# Patient Record
Sex: Male | Born: 1945 | Race: White | Hispanic: No | Marital: Married | Smoking: Current every day smoker
Health system: Southern US, Community
[De-identification: ages and names within clinical notes are randomized; demographics above are authoritative.]

## PROBLEM LIST (undated history)

## (undated) DIAGNOSIS — K922 Gastrointestinal hemorrhage, unspecified: Secondary | ICD-10-CM

## (undated) DIAGNOSIS — I4892 Unspecified atrial flutter: Secondary | ICD-10-CM

## (undated) DIAGNOSIS — K7682 Hepatic encephalopathy: Secondary | ICD-10-CM

## (undated) DIAGNOSIS — I499 Cardiac arrhythmia, unspecified: Secondary | ICD-10-CM

## (undated) DIAGNOSIS — K259 Gastric ulcer, unspecified as acute or chronic, without hemorrhage or perforation: Secondary | ICD-10-CM

## (undated) DIAGNOSIS — J154 Pneumonia due to other streptococci: Secondary | ICD-10-CM

## (undated) DIAGNOSIS — K7460 Unspecified cirrhosis of liver: Secondary | ICD-10-CM

## (undated) DIAGNOSIS — J189 Pneumonia, unspecified organism: Secondary | ICD-10-CM

## (undated) DIAGNOSIS — J9691 Respiratory failure, unspecified with hypoxia: Secondary | ICD-10-CM

## (undated) DIAGNOSIS — T4145XA Adverse effect of unspecified anesthetic, initial encounter: Secondary | ICD-10-CM

## (undated) DIAGNOSIS — S2239XA Fracture of one rib, unspecified side, initial encounter for closed fracture: Secondary | ICD-10-CM

## (undated) DIAGNOSIS — E871 Hypo-osmolality and hyponatremia: Secondary | ICD-10-CM

## (undated) DIAGNOSIS — D696 Thrombocytopenia, unspecified: Secondary | ICD-10-CM

## (undated) DIAGNOSIS — K769 Liver disease, unspecified: Secondary | ICD-10-CM

## (undated) DIAGNOSIS — S72009A Fracture of unspecified part of neck of unspecified femur, initial encounter for closed fracture: Secondary | ICD-10-CM

## (undated) DIAGNOSIS — L02519 Cutaneous abscess of unspecified hand: Secondary | ICD-10-CM

## (undated) DIAGNOSIS — N189 Chronic kidney disease, unspecified: Secondary | ICD-10-CM

## (undated) DIAGNOSIS — T8859XA Other complications of anesthesia, initial encounter: Secondary | ICD-10-CM

## (undated) DIAGNOSIS — K7290 Hepatic failure, unspecified without coma: Secondary | ICD-10-CM

## (undated) HISTORY — PX: CATARACT EXTRACTION, BILATERAL: SHX1313

## (undated) HISTORY — PX: OTHER SURGICAL HISTORY: SHX169

---

## 2012-04-16 ENCOUNTER — Emergency Department (HOSPITAL_COMMUNITY): Payer: 59

## 2012-04-16 ENCOUNTER — Inpatient Hospital Stay (HOSPITAL_COMMUNITY)
Admission: EM | Admit: 2012-04-16 | Discharge: 2012-04-21 | DRG: 378 | Disposition: A | Discharge status: Home / Self Care | Payer: 59 | Attending: Internal Medicine | Admitting: Internal Medicine

## 2012-04-16 ENCOUNTER — Encounter (HOSPITAL_COMMUNITY): Payer: Self-pay

## 2012-04-16 ENCOUNTER — Encounter (HOSPITAL_COMMUNITY)
Admission: EM | Disposition: A | Discharge status: Home / Self Care | Payer: Self-pay | Source: Home / Self Care | Attending: Internal Medicine

## 2012-04-16 DIAGNOSIS — D696 Thrombocytopenia, unspecified: Secondary | ICD-10-CM | POA: Diagnosis present

## 2012-04-16 DIAGNOSIS — E876 Hypokalemia: Secondary | ICD-10-CM | POA: Diagnosis present

## 2012-04-16 DIAGNOSIS — E8809 Other disorders of plasma-protein metabolism, not elsewhere classified: Secondary | ICD-10-CM | POA: Diagnosis present

## 2012-04-16 DIAGNOSIS — E440 Moderate protein-calorie malnutrition: Secondary | ICD-10-CM | POA: Diagnosis present

## 2012-04-16 DIAGNOSIS — K7460 Unspecified cirrhosis of liver: Secondary | ICD-10-CM | POA: Diagnosis present

## 2012-04-16 DIAGNOSIS — Z720 Tobacco use: Secondary | ICD-10-CM | POA: Diagnosis present

## 2012-04-16 DIAGNOSIS — K5900 Constipation, unspecified: Secondary | ICD-10-CM | POA: Diagnosis present

## 2012-04-16 DIAGNOSIS — Z8719 Personal history of other diseases of the digestive system: Secondary | ICD-10-CM | POA: Diagnosis present

## 2012-04-16 DIAGNOSIS — K922 Gastrointestinal hemorrhage, unspecified: Principal | ICD-10-CM | POA: Diagnosis present

## 2012-04-16 DIAGNOSIS — K449 Diaphragmatic hernia without obstruction or gangrene: Secondary | ICD-10-CM | POA: Diagnosis present

## 2012-04-16 DIAGNOSIS — K319 Disease of stomach and duodenum, unspecified: Secondary | ICD-10-CM | POA: Diagnosis present

## 2012-04-16 DIAGNOSIS — K269 Duodenal ulcer, unspecified as acute or chronic, without hemorrhage or perforation: Secondary | ICD-10-CM | POA: Diagnosis present

## 2012-04-16 DIAGNOSIS — K766 Portal hypertension: Secondary | ICD-10-CM | POA: Diagnosis present

## 2012-04-16 DIAGNOSIS — F17200 Nicotine dependence, unspecified, uncomplicated: Secondary | ICD-10-CM | POA: Diagnosis present

## 2012-04-16 DIAGNOSIS — D62 Acute posthemorrhagic anemia: Secondary | ICD-10-CM | POA: Diagnosis present

## 2012-04-16 DIAGNOSIS — R188 Other ascites: Secondary | ICD-10-CM | POA: Diagnosis present

## 2012-04-16 DIAGNOSIS — Z6834 Body mass index (BMI) 34.0-34.9, adult: Secondary | ICD-10-CM

## 2012-04-16 HISTORY — PX: ESOPHAGOGASTRODUODENOSCOPY: SHX5428

## 2012-04-16 HISTORY — DX: Gastric ulcer, unspecified as acute or chronic, without hemorrhage or perforation: K25.9

## 2012-04-16 LAB — CBC WITH DIFFERENTIAL/PLATELET
Basophils Relative: 1 % (ref 0–1)
Eosinophils Relative: 3 % (ref 0–5)
Hemoglobin: 7.4 g/dL — ABNORMAL LOW (ref 13.0–17.0)
Lymphs Abs: 1.7 10*3/uL (ref 0.7–4.0)
MCH: 24.6 pg — ABNORMAL LOW (ref 26.0–34.0)
MCV: 77.7 fL — ABNORMAL LOW (ref 78.0–100.0)
Monocytes Absolute: 0.8 10*3/uL (ref 0.1–1.0)
Neutro Abs: 6.2 10*3/uL (ref 1.7–7.7)
RBC: 3.01 MIL/uL — ABNORMAL LOW (ref 4.22–5.81)

## 2012-04-16 LAB — COMPREHENSIVE METABOLIC PANEL
Albumin: 2.2 g/dL — ABNORMAL LOW (ref 3.5–5.2)
Alkaline Phosphatase: 172 U/L — ABNORMAL HIGH (ref 39–117)
BUN: 26 mg/dL — ABNORMAL HIGH (ref 6–23)
CO2: 23 mEq/L (ref 19–32)
Chloride: 105 mEq/L (ref 96–112)
Creatinine, Ser: 0.66 mg/dL (ref 0.50–1.35)
GFR calc non Af Amer: >90 mL/min (ref >90)
Glucose, Bld: 135 mg/dL — ABNORMAL HIGH (ref 70–99)
Potassium: 4.9 mEq/L (ref 3.5–5.1)
Total Bilirubin: 1.3 mg/dL — ABNORMAL HIGH (ref 0.3–1.2)

## 2012-04-16 LAB — PREPARE RBC (CROSSMATCH)

## 2012-04-16 LAB — APTT: aPTT: 38 seconds — ABNORMAL HIGH (ref 24–37)

## 2012-04-16 SURGERY — EGD (ESOPHAGOGASTRODUODENOSCOPY)
Anesthesia: Moderate Sedation

## 2012-04-16 MED ORDER — MORPHINE SULFATE 2 MG/ML IJ SOLN: 2.0000 mg | INTRAMUSCULAR | Status: DC | PRN

## 2012-04-16 MED ORDER — FENTANYL CITRATE 0.05 MG/ML IJ SOLN
INTRAMUSCULAR | Status: DC | PRN
  Administered 2012-04-16 (×2): 25 ug via INTRAVENOUS

## 2012-04-16 MED ORDER — MIDAZOLAM HCL 10 MG/2ML IJ SOLN
INTRAMUSCULAR | Status: DC | PRN
  Administered 2012-04-16: 1 mg via INTRAVENOUS
  Administered 2012-04-16 (×2): 2 mg via INTRAVENOUS

## 2012-04-16 MED ORDER — SODIUM CHLORIDE 0.9 % IJ SOLN
3.0000 mL | Freq: Two times a day (BID) | INTRAMUSCULAR | Status: DC
  Administered 2012-04-17 – 2012-04-21 (×9): 3 mL via INTRAVENOUS

## 2012-04-16 MED ORDER — ONDANSETRON HCL 4 MG/2ML IJ SOLN: 4.0000 mg | Freq: Three times a day (TID) | INTRAMUSCULAR | Status: DC | PRN

## 2012-04-16 MED ORDER — SODIUM CHLORIDE 0.9 % IV SOLN: INTRAVENOUS | Status: DC

## 2012-04-16 MED ORDER — FENTANYL CITRATE 0.05 MG/ML IJ SOLN
INTRAMUSCULAR | Status: AC
  Filled 2012-04-16: qty 4

## 2012-04-16 MED ORDER — SODIUM CHLORIDE 0.9 % IV SOLN
1000.0000 mL | INTRAVENOUS | Status: DC
  Administered 2012-04-16: 1000 mL via INTRAVENOUS

## 2012-04-16 MED ORDER — PANTOPRAZOLE SODIUM 40 MG IV SOLR
40.0000 mg | Freq: Two times a day (BID) | INTRAVENOUS | Status: DC
  Administered 2012-04-16 – 2012-04-18 (×5): 40 mg via INTRAVENOUS
  Filled 2012-04-16 (×8): qty 40

## 2012-04-16 MED ORDER — ONDANSETRON HCL 4 MG PO TABS: 4.0000 mg | ORAL_TABLET | Freq: Four times a day (QID) | ORAL | Status: DC | PRN

## 2012-04-16 MED ORDER — SODIUM CHLORIDE 0.9 % IV SOLN
1000.0000 mL | Freq: Once | INTRAVENOUS | Status: AC
  Administered 2012-04-16: 1000 mL via INTRAVENOUS

## 2012-04-16 MED ORDER — ONDANSETRON HCL 4 MG/2ML IJ SOLN: 4.0000 mg | Freq: Four times a day (QID) | INTRAMUSCULAR | Status: DC | PRN

## 2012-04-16 MED ORDER — ONDANSETRON HCL 4 MG/2ML IJ SOLN
4.0000 mg | Freq: Once | INTRAMUSCULAR | Status: AC
  Administered 2012-04-16: 4 mg via INTRAVENOUS
  Filled 2012-04-16: qty 2

## 2012-04-16 MED ORDER — BUTAMBEN-TETRACAINE-BENZOCAINE 2-2-14 % EX AERO
INHALATION_SPRAY | CUTANEOUS | Status: DC | PRN
  Administered 2012-04-16: 2 via TOPICAL

## 2012-04-16 MED ORDER — MIDAZOLAM HCL 10 MG/2ML IJ SOLN
INTRAMUSCULAR | Status: AC
  Filled 2012-04-16: qty 4

## 2012-04-16 MED ORDER — DEXTROSE-NACL 5-0.45 % IV SOLN
INTRAVENOUS | Status: AC
  Administered 2012-04-16: 14:00:00 via INTRAVENOUS

## 2012-04-16 MED ORDER — DIPHENHYDRAMINE HCL 50 MG/ML IJ SOLN
INTRAMUSCULAR | Status: AC
  Filled 2012-04-16: qty 1

## 2012-04-16 NOTE — Progress Notes (Signed)
Notified Dr. Darnelle Catalan of patient current h&h; ordered to transfuse 2 additional units; pt and family aware

## 2012-04-16 NOTE — Progress Notes (Signed)
Pt received second unit PRBCs; tolerated well; no signs of complications; will recheck H&H as ordered

## 2012-04-16 NOTE — Op Note (Signed)
[facility] [address] [place], [address]   ENDOSCOPY PROCEDURE REPORT  PATIENT: Clifford Cook, Clifford Cook  MR#: [id number] BIRTHDATE: 01/11/1946 , 66  yrs. old GENDER: Male  ENDOSCOPIST: Vida Rigger, MD REFERRED BY:  PROCEDURE DATE:  04/16/2012 PROCEDURE:   EGD, diagnostic ASA CLASS:   Class II INDICATIONS:Acute post hemorrhagic anemia, Hematemesis, and Melena.   MEDICATIONS: Fentanyl 50 mcg IV and 5 Versed  TOPICAL ANESTHETIC:used  DESCRIPTION OF PROCEDURE:   After the risks benefits and alternatives of the procedure were thoroughly explained, informed consent was obtained.  The Pentax Gastroscope [id number]  endoscope was introduced through the mouth and advanced to the second portion of the duodenum , limited by Without limitations.  on insertion lots of fairly fresh blood and clots was seen in the stomach and we spent a good bit of time washing and suctioning but could not clear the proximal stomach includingrolling him on his back and raising the head of his bedafter a prolonged effort no active bleeding source was seen in a few small findings are recorded below and the patient tolerated the procedure well The instrument was slowly withdrawn as the mucosa was fully examined.there was no obvious immediate complication         FINDINGS:1. Fairly fresh blood and clots in proximal stomach unable to suction or see underneath but no active bleeding 2. Small hiatal hernia with questionable tiny Mallory-Weiss tear 3. Few duodenal bulb shallow erosions without signs of bleeding 4. Otherwise within normal limits to the second portion of the duodenum  COMPLICATIONS:none  ENDOSCOPIC IMPRESSION: above   RECOMMENDATIONS:continue supportive care repeat endoscopy when necessary or tomorrow.transfuse when necessary continue pump inhibitors and ice chips and sips only   REPEAT EXAM: tomorrow   _______________________________ Vida Rigger, MD eSigned:  Vida Rigger,  MD 04/16/2012 3:41 PM    CC:  PATIENT NAME:  Clifford Cook, Clifford Cook MR#: [id number]

## 2012-04-16 NOTE — Consult Note (Signed)
Subjective:   HPI  The patient is a 67 year old male who has been experiencing black-colored diarrhea stools for the past 3 or 4 days. Yesterday he vomited dark coffee-ground -appearing material as well as today. He has taken some Naprosyn recently. He drinks occasional alcohol but states not every day. He gives a history of having had an ulcer 40 years ago. He denied vomiting bright red blood or passing bright red blood per rectum. He denies abdominal pain. He came to the emergency room where he was found to be anemic. He is currently receiving blood transfusion.  Review of Systems Denies chest pain or shortness of breath  Past Medical History  Diagnosis Date  . Stomach ulcer    Past Surgical History  Procedure Laterality Date  . Cataract extraction, bilateral    . Surgery for lazy eye      age 70   History   Social History  . Marital Status: Married    Spouse Name: N/A    Number of Children: N/A  . Years of Education: N/A   Occupational History  . Not on file.   Social History Main Topics  . Smoking status: Current Every Day Smoker -- 1.00 packs/day    Types: Cigarettes  . Smokeless tobacco: Never Used  . Alcohol Use: Yes  . Drug Use: No  . Sexually Active: No   Other Topics Concern  . Not on file   Social History Narrative  . No narrative on file   family history is not on file. Current facility-administered medications:0.9 %  sodium chloride infusion, 1,000 mL, Intravenous, Continuous, Celene Kras, MD, Last Rate: 125 mL/hr at 04/16/12 0900, 1,000 mL at 04/16/12 0900 Current outpatient prescriptions:naproxen sodium (ANAPROX) 220 MG tablet, Take 220 mg by mouth 2 (two) times daily with a meal., Disp: , Rfl:  Allergies  Allergen Reactions  . Penicillins Other (See Comments)    Cant remember     Objective:     BP 115/68  Pulse 109  Temp(Src) 98.3 F (36.8 C) (Oral)  Resp 18  SpO2 95%  He appears somewhat pale, but in no acute distress.  Heart regular  rhythm no murmurs  Lungs clear  Abdomen: Bowel sounds are normal, soft, nontender  Laboratory No components found with this basename: d1      Assessment:     Upper GI bleed characterized by coffee-ground emesis and melena  Anemia secondary to blood loss        Plan:     The patient is receiving blood transfusion at this time. PPI therapy instituted. We'll plan EGD. Lab Results  Component Value Date   HGB 7.4* 04/16/2012   HCT 23.4* 04/16/2012   ALKPHOS 172* 04/16/2012   AST 35 04/16/2012   ALT 17 04/16/2012

## 2012-04-16 NOTE — ED Provider Notes (Signed)
History    CSN: [id number] Arrival date & time 04/16/12  0725 First MD Initiated Contact with Patient 04/16/12 [phone]      Chief Complaint  Patient presents with  . Hematemesis     HPI Patient presents to the emergency room because he is worried that he has a recurrent stomach ulcer. A few weeks ago he had an episode of hemoptysis. The symptoms resolved the patient did not get evaluated. He and no further episodes the last few days. A few days ago he had an episode of hemoptysis again. It was dark brown and black emesis.  The patient has not vomited since that time. He has however continued to notice dark stools.  He has also had some generalized abdominal discomfort.  He denies fevers. He denies coughing.    Patient does take Aleve but only occasionally. He does have history of ulcer in the past. Past Medical History  Diagnosis Date  . Stomach ulcer     History reviewed. No pertinent past surgical history.  History reviewed. No pertinent family history.  History  Substance Use Topics  . Smoking status: Current Every Day Smoker -- 1.00 packs/day    Types: Cigarettes  . Smokeless tobacco: Not on file  . Alcohol Use: Yes   he occasionally drinks alcohol but has not had any weeks    Review of Systems  Constitutional: Negative for fever.  Respiratory: Negative for cough.   Cardiovascular: Negative for chest pain.  Gastrointestinal: Negative for abdominal distention.  All other systems reviewed and are negative.    Allergies  Penicillins  Home Medications   Current Outpatient Rx  Name  Route  Sig  Dispense  Refill  . naproxen sodium (ANAPROX) 220 MG tablet   Oral   Take 220 mg by mouth 2 (two) times daily with a meal.           BP 126/68  Pulse 102  Temp(Src) 98.5 F (36.9 C) (Oral)  Resp 19  SpO2 97%  Physical Exam  Nursing note and vitals reviewed. Constitutional: No distress.  HENT:  Head: Normocephalic and atraumatic.  Right Ear: External ear normal.   Left Ear: External ear normal.  Eyes: Conjunctivae are normal. Right eye exhibits no discharge. Left eye exhibits no discharge. No scleral icterus.  Neck: Neck supple. No tracheal deviation present.  Cardiovascular: Normal rate, regular rhythm and intact distal pulses.   Pulmonary/Chest: Effort normal and breath sounds normal. No stridor. No respiratory distress. He has no wheezes. He has no rales.  Abdominal: Soft. Bowel sounds are normal. He exhibits no distension. There is no tenderness. There is no rebound and no guarding.  Mild discomfort to palpation in the lower abdomen, no rebound no guarding, no mass or hernia  Genitourinary:  No rectal mass, small amount of gross blood noted on rectal exam  Musculoskeletal: He exhibits no edema and no tenderness.  Neurological: He is alert. He has normal strength. No sensory deficit. Cranial nerve deficit:  no gross defecits noted. He exhibits normal muscle tone. He displays no seizure activity. Coordination normal.  Skin: Skin is warm and dry. No rash noted.  Psychiatric: He has a normal mood and affect.    ED Course  Procedures (including critical care time)  CRITICAL CARE Performed by: Linwood Dibbles R Total critical care time: 35 Critical care time was exclusive of separately billable procedures and treating other patients. Critical care was necessary to treat or prevent imminent or life-threatening deterioration. Critical care was time spent  personally by me on the following activities: development of treatment plan with patient and/or surrogate as well as nursing, discussions with consultants, evaluation of patient's response to treatment, examination of patient, obtaining history from patient or surrogate, ordering and performing treatments and interventions, ordering and review of laboratory studies, ordering and review of radiographic studies, pulse oximetry and re-evaluation of patient's condition.  Medications  0.9 %  sodium chloride  infusion (0 mLs Intravenous Stopped 04/16/12 1024)    Followed by  0.9 %  sodium chloride infusion (1,000 mLs Intravenous New Bag/Given 04/16/12 0900)  ondansetron (ZOFRAN) injection 4 mg (4 mg Intravenous Given 04/16/12 0858)   10:29 AM Blood transfusion ordered.  Labs Reviewed  COMPREHENSIVE METABOLIC PANEL - Abnormal; Notable for the following:    Glucose, Bld 135 (*)    BUN 26 (*)    Calcium 7.6 (*)    Total Protein 5.8 (*)    Albumin 2.2 (*)    Alkaline Phosphatase 172 (*)    Total Bilirubin 1.3 (*)    All other components within normal limits  APTT - Abnormal; Notable for the following:    aPTT 38 (*)    All other components within normal limits  PROTIME-INR - Abnormal; Notable for the following:    Prothrombin Time 17.3 (*)    All other components within normal limits  CBC WITH DIFFERENTIAL - Abnormal; Notable for the following:    RBC 3.01 (*)    Hemoglobin 7.4 (*)    HCT 23.4 (*)    MCV 77.7 (*)    MCH 24.6 (*)    RDW 21.5 (*)    Platelets 145 (*)    All other components within normal limits  OCCULT BLOOD, POC DEVICE - Abnormal; Notable for the following:    Fecal Occult Bld POSITIVE (*)    All other components within normal limits  TYPE AND SCREEN  PREPARE RBC (CROSSMATCH)  ABO/RH   Dg Chest Portable 1 View  04/16/2012  *RADIOLOGY REPORT*  Clinical Data: Hematemesis, shortness of breath, congestion  PORTABLE CHEST - 1 VIEW  Comparison: None.  Findings: Chronic interstitial markings/bronchitic changes.  No focal consolidation. No pleural effusion or pneumothorax.  Mild cardiomegaly.  IMPRESSION: No evidence of acute cardiopulmonary disease.  Chronic interstitial markings/bronchitic changes.   Original Report Authenticated By: Charline Bills, M.D.      1. GI bleeding   2. Anemia associated with acute blood loss       MDM  Symptoms concerning for upper gi bleed.  No history of heavy alcohol use.  Doubt cirrhosis, esophageal bleeding.  Likely gastric ulcer.  Could  be related to NSAID use.  Hgb is decreased to 7.4.  Will order blood transfusion.  Continue to monitor closely.  Discussed findings with patient and family.  Consult gi, admit to medical service for further evaluation, monitoring.  Discussed case with Dr Ewing Schlein and Dr. Milana Obey, MD 04/16/12 1030

## 2012-04-16 NOTE — H&P (Signed)
[facility] History and Physical  Clifford Cook ZOX:[id number] DOB: August 02, 1945 DOA: 04/16/2012  Referring physician: Dr. Linwood Cook PCP: Clifford Acre, MD   Chief Complaint: Black stools.   History of Present Illness: Clifford Cook is an 67 y.o. male with a past medical history of a remote ulcer at the age of 45, but no significant other past medical history who presented to the hospital today with a chief complaint of black stools. The patient noted that he had dark stools for at least one week, and a few episodes of vomiting up material that looked like coffee grounds. He does take regular NSAIDs. Denies any associated chest pain, dyspnea, or dizziness. Upon initial evaluation in the emergency department, the patient's hemoglobin was found to be 7.4 mg/dL and he had positive rectal blood. There no frankly aggravating or alleviating factors. He does not follow with a physician regularly.  Review of Systems: Constitutional: No fever, no chills;  Appetite normal; No weight loss, no weight gain.  HEENT: No blurry vision, no diplopia, no pharyngitis, no dysphagia CV: No chest pain, no palpitations.  Resp: No SOB, no cough. GI:  +nausea and vomiting, no diarrhea, + melena, no hematochezia.  GU: No dysuria, no hematuria.  MSK: no myalgias, no arthralgias.  Neuro:  No headache, no focal neurological deficits, no history of seizures.  Psych: No depression, no anxiety.  Endo: No thyroid disease, no DM, no heat intolerance, no cold intolerance, no polyuria, no polydipsia  Skin: No rashes, no skin lesions.  Heme: No easy bruising, no history of blood diseases.  Past Medical History Past Medical History  Diagnosis Date  . Stomach ulcer      Past Surgical History Past Surgical History  Procedure Laterality Date  . Cataract extraction, bilateral    . Surgery for lazy eye      age 69     Social History: History   Social History  . Marital Status: Married    Spouse Name: N/A    Number of Children:  N/A  . Years of Education: N/A   Occupational History  . Not on file.   Social History Main Topics  . Smoking status: Current Every Day Smoker -- 1.00 packs/day    Types: Cigarettes  . Smokeless tobacco: Never Used  . Alcohol Use: Yes  . Drug Use: No  . Sexually Active: No   Other Topics Concern  . Not on file   Social History Narrative   Patient is married. He is independent.    Family History:  History reviewed. No pertinent family history.  Allergies: Penicillins  Meds: Prior to Admission medications   Medication Sig Start Date End Date Taking? Authorizing Provider  naproxen sodium (ANAPROX) 220 MG tablet Take 220 mg by mouth 2 (two) times daily with a meal.   Yes Historical Provider, MD    Physical Exam: Filed Vitals:   04/16/12 1600 04/16/12 1700 04/16/12 1800 04/16/12 1815  BP: 108/41 104/46 113/57   Pulse: 92 94 101 103  Temp: 98.5 F (36.9 C)  98.6 F (37 C)   TempSrc: Oral     Resp: 16 14 16 19   Height:      Weight:      SpO2: 98% 98%       Physical Exam: Blood pressure 113/57, pulse 103, temperature 98.6 F (37 C), temperature source Oral, resp. rate 19, height 5\' 10"  (1.778 m), weight 105 kg (231 lb 7.7 oz), SpO2 98.00%. Gen: No acute distress. Head: Normocephalic, atraumatic.  Eyes: PERRL, EOMI, sclerae nonicteric. Mouth: Oropharynx clear with dry mucous membranes. Neck: Supple, no thyromegaly, no lymphadenopathy, no jugular venous distention. Chest: Lungs clear to auscultation bilaterally. CV: Heart sounds are regular but mildly tachycardic. No murmurs, rubs, or gallops. Abdomen: Soft, mildly tender, nondistended with normal active bowel sounds. Extremities: Extremities are without clubbing, edema, or cyanosis. Skin: Warm and dry. Neuro: Alert and oriented times 3; cranial nerves II through XII grossly intact. Psych: Mood and affect normal.  Labs on Admission:  Basic Metabolic Panel:  Recent Labs Lab 04/16/12 0845  NA 135  K 4.9  CL  105  CO2 23  GLUCOSE 135*  BUN 26*  CREATININE 0.66  CALCIUM 7.6*   Liver Function Tests:  Recent Labs Lab 04/16/12 0845  AST 35  ALT 17  ALKPHOS 172*  BILITOT 1.3*  PROT 5.8*  ALBUMIN 2.2*   CBC:  Recent Labs Lab 04/16/12 0845 04/16/12 1546  WBC 9.1  --   NEUTROABS 6.2  --   HGB 7.4* 7.4*  HCT 23.4* 23.1*  MCV 77.7*  --   PLT 145*  --     Radiological Exams on Admission: Dg Chest Portable 1 View  04/16/2012  *RADIOLOGY REPORT*  Clinical Data: Hematemesis, shortness of breath, congestion  PORTABLE CHEST - 1 VIEW  Comparison: None.  Findings: Chronic interstitial markings/bronchitic changes.  No focal consolidation. No pleural effusion or pneumothorax.  Mild cardiomegaly.  IMPRESSION: No evidence of acute cardiopulmonary disease.  Chronic interstitial markings/bronchitic changes.   Original Report Authenticated By: Charline Bills, M.D.     Assessment/Plan Principal Problem:   GI bleed with acute blood loss anemia -Admitted to the step down unit and receiving 2 units of packed red blood cells. -Check hemoglobin q. 8 hours x6. -Transfuse for brisk bleeding or dropping hemoglobin. -PPI therapy ordered. -Emergent GI consultation has been requested. Active Problems:   Tobacco abuse -Counseled on tobacco cessation when stable.  Code Status: Full Family Communication: Family updated at bedside. Disposition Plan: Home when stable.  Time spent: One hour.  Messiah Ahr [facility] Pager [phone]  If 7PM-7AM, please contact night-coverage [email or web address] Password [facility] 04/16/2012, 6:27 PM

## 2012-04-16 NOTE — ED Notes (Signed)
XR at bedside

## 2012-04-16 NOTE — ED Notes (Signed)
MD at bedside. 

## 2012-04-16 NOTE — ED Notes (Addendum)
Pt c/o hemoptysis and rectal bleeding x 3 days.  Sts this happened "a couple weeks ago" and it resolved after taking Maalox.  Sts blood was "dark brown/black."  Denies Hx of GI issues.  Sts Stomach Ulcer x 45 years ago.  Vitals are stable.  Pt is not actively vomiting.

## 2012-04-16 NOTE — Progress Notes (Signed)
Pt confirms pcp is Dr Ihor Dow  EPIC updated  Pt reports he "really do not have a doctor but last seen by Dr Ihor Dow"

## 2012-04-17 ENCOUNTER — Inpatient Hospital Stay (HOSPITAL_COMMUNITY): Payer: 59

## 2012-04-17 ENCOUNTER — Encounter (HOSPITAL_COMMUNITY): Payer: Self-pay

## 2012-04-17 ENCOUNTER — Encounter (HOSPITAL_COMMUNITY)
Admission: EM | Disposition: A | Discharge status: Home / Self Care | Payer: Self-pay | Source: Home / Self Care | Attending: Internal Medicine

## 2012-04-17 DIAGNOSIS — D62 Acute posthemorrhagic anemia: Secondary | ICD-10-CM

## 2012-04-17 DIAGNOSIS — F17200 Nicotine dependence, unspecified, uncomplicated: Secondary | ICD-10-CM

## 2012-04-17 HISTORY — PX: ESOPHAGOGASTRODUODENOSCOPY: SHX5428

## 2012-04-17 LAB — HEMOGLOBIN
Hemoglobin: 8.3 g/dL — ABNORMAL LOW (ref 13.0–17.0)
Hemoglobin: 9.2 g/dL — ABNORMAL LOW (ref 13.0–17.0)

## 2012-04-17 LAB — BASIC METABOLIC PANEL
BUN: 25 mg/dL — ABNORMAL HIGH (ref 6–23)
CO2: 25 mEq/L (ref 19–32)
Chloride: 108 mEq/L (ref 96–112)
Glucose, Bld: 119 mg/dL — ABNORMAL HIGH (ref 70–99)
Potassium: 3.8 mEq/L (ref 3.5–5.1)
Sodium: 136 mEq/L (ref 135–145)

## 2012-04-17 LAB — HEMOGLOBIN AND HEMATOCRIT, BLOOD: HCT: 25.9 % — ABNORMAL LOW (ref 39.0–52.0)

## 2012-04-17 LAB — PROTIME-INR: INR: 1.41 (ref 0.00–1.49)

## 2012-04-17 SURGERY — EGD (ESOPHAGOGASTRODUODENOSCOPY)
Anesthesia: Moderate Sedation

## 2012-04-17 MED ORDER — IPRATROPIUM BROMIDE 0.02 % IN SOLN
0.2500 mg | Freq: Four times a day (QID) | RESPIRATORY_TRACT | Status: DC | PRN
  Administered 2012-04-17: 0.5 mg via RESPIRATORY_TRACT
  Filled 2012-04-17: qty 2.5

## 2012-04-17 MED ORDER — FENTANYL CITRATE 0.05 MG/ML IJ SOLN
INTRAMUSCULAR | Status: AC
  Filled 2012-04-17: qty 2

## 2012-04-17 MED ORDER — MIDAZOLAM HCL 10 MG/2ML IJ SOLN
INTRAMUSCULAR | Status: AC
  Filled 2012-04-17: qty 2

## 2012-04-17 MED ORDER — IOHEXOL 300 MG/ML  SOLN
25.0000 mL | INTRAMUSCULAR | Status: AC
  Administered 2012-04-17 (×2): 25 mL via ORAL

## 2012-04-17 MED ORDER — ALBUTEROL SULFATE (5 MG/ML) 0.5% IN NEBU
2.5000 mg | INHALATION_SOLUTION | Freq: Four times a day (QID) | RESPIRATORY_TRACT | Status: DC | PRN
  Administered 2012-04-17: 2.5 mg via RESPIRATORY_TRACT
  Filled 2012-04-17: qty 0.5

## 2012-04-17 MED ORDER — BUTAMBEN-TETRACAINE-BENZOCAINE 2-2-14 % EX AERO
INHALATION_SPRAY | CUTANEOUS | Status: DC | PRN
  Administered 2012-04-17: 2 via TOPICAL

## 2012-04-17 MED ORDER — IOHEXOL 300 MG/ML  SOLN
100.0000 mL | Freq: Once | INTRAMUSCULAR | Status: AC | PRN
  Administered 2012-04-17: 100 mL via INTRAVENOUS

## 2012-04-17 MED ORDER — FENTANYL CITRATE 0.05 MG/ML IJ SOLN
INTRAMUSCULAR | Status: DC | PRN
  Administered 2012-04-17 (×2): 25 ug via INTRAVENOUS

## 2012-04-17 MED ORDER — MIDAZOLAM HCL 10 MG/2ML IJ SOLN
INTRAMUSCULAR | Status: DC | PRN
  Administered 2012-04-17 (×2): 2 mg via INTRAVENOUS

## 2012-04-17 NOTE — Progress Notes (Signed)
Attempted to titrate off 2 liters nasal cannula.Patient sats upper 80's on room air.

## 2012-04-17 NOTE — Op Note (Signed)
[facility] [address] [place], [address]   ENDOSCOPY PROCEDURE REPORT  PATIENT: Clifford, [name]  MR#: [id number] BIRTHDATE: 1945-07-17 , 66  yrs. old GENDER: Male  ENDOSCOPIST: Vida Rigger, MD REFERRED BY:  PROCEDURE DATE:  04/17/2012 PROCEDURE:   EGD, diagnostic ASA CLASS:   Class II INDICATIONS:Hematemesis and Melena.  MEDICATIONS: Fentanyl 50 mcg IV and Versed 4 mg IV  TOPICAL ANESTHETIC:used  DESCRIPTION OF PROCEDURE:   After the risks benefits and alternatives of the procedure were thoroughly explained, informed consent was obtained.  The endoscope [id number]  endoscope was introduced through the mouth and advanced to the second portion of the duodenum , limited by Without limitations.   The instrument was slowly withdrawn as the mucosa was fully examined.no blood was seen and the findings are recorded below and the patient tolerated the procedure well there was no obvious immediate complication         FINDINGS:1. Probable portal gastropathy as cause of bleeding 2otherwise within normal limits EGD to the second portion of the duodenum and even bulb erosions appeared better  COMPLICATIONS:none  ENDOSCOPIC IMPRESSION: above   RECOMMENDATIONS:Will get a CAT scan to evaluate the liver and the portal vessels with further recommendations pending those findings   REPEAT EXAM: as needed   _______________________________ Vida Rigger, MD eSigned:  Vida Rigger, MD 04/17/2012 11:41 AM    CC:  PATIENT NAME:  [name], Clifford MR#: [id number]

## 2012-04-17 NOTE — Plan of Care (Signed)
Problem: Phase I Progression Outcomes Goal: Pain controlled with appropriate interventions Outcome: Progressing Denies pain. Goal: OOB as tolerated unless otherwise ordered Outcome: Not Progressing lethargic

## 2012-04-17 NOTE — Progress Notes (Signed)
[facility] PROGRESS NOTE  Clifford [name] QIO:[id number] DOB: 06-07-1945 DOA: 04/16/2012 PCP: Gretel Acre, MD  Brief narrative: 67 y.o. male with a past medical history of a remote ulcer at the age of 45 who presented to [facility] ED 04/16/12 with complaint of having black stools for about 1 week prior to this admission. Patient also had few episodes of coffee ground emesis prior to this admission. In ED, CBC revealed hemoglobin of 7.4 and hemoccult positive stool. CXR did not reveal acute cardiopulmonary findings.  Assessment/Plan:  Principal Problem:  *Acute blood loss anemia  - per EGD findings probable portal gastropathy. Per GI recommendation, CT abd for further evaluation of liver - continue protonix 40 mg IV Q 12 hours - hemoglobin stable in range 7.4 - 8.3 - continue IV fluids  Active Problems:  Tobacco abuse  -Counseled on tobacco cessation   Code Status: Full  Family Communication: Family updated at bedside.  Disposition Plan: Home when stable.  Manson Passey, MD  [facility] Pager [phone]  If 7PM-7AM, please contact night-coverage [email or web address] Password [email or web address] 04/17/2012, 11:46 AM   LOS: 1 day   Consultants:  Gastroenterology (Dr. Ewing Schlein)  Procedures:  EGD 04/17/12  Antibiotics:  None   HPI/Subjective: No acute overnight events.  Objective: Filed Vitals:   04/17/12 1125 04/17/12 1130 04/17/12 1135 04/17/12 1140  BP: 111/50 98/41 96/41  91/49  Pulse:      Temp:    98.3 F (36.8 C)  TempSrc:    Oral  Resp: 13 14 13 14   Height:      Weight:      SpO2: 85% 86% 93% 95%    Intake/Output Summary (Last 24 hours) at 04/17/12 1146 Last data filed at 04/17/12 1100  Gross per 24 hour  Intake 3285.83 ml  Output   1850 ml  Net 1435.83 ml    Exam:   General:  Pt is alert, follows commands appropriately, not in acute distress  Cardiovascular: Regular rate and rhythm, S1/S2, no murmurs, no rubs, no gallops  Respiratory: Clear to auscultation bilaterally, no wheezing,  no crackles, no rhonchi  Abdomen: Soft, non tender, non distended, bowel sounds present, no guarding  Extremities: No edema, pulses DP and PT palpable bilaterally  Neuro: Grossly nonfocal  Data Reviewed: Basic Metabolic Panel:  Recent Labs Lab 04/16/12 0845 04/17/12 0515  NA 135 136  K 4.9 3.8  CL 105 108  CO2 23 25  GLUCOSE 135* 119*  BUN 26* 25*  CREATININE 0.66 0.66  CALCIUM 7.6* 7.4*   Liver Function Tests:  Recent Labs Lab 04/16/12 0845  AST 35  ALT 17  ALKPHOS 172*  BILITOT 1.3*  PROT 5.8*  ALBUMIN 2.2*   No results found for this basename: LIPASE, AMYLASE,  in the last 168 hours No results found for this basename: AMMONIA,  in the last 168 hours CBC:  Recent Labs Lab 04/16/12 0845 04/16/12 1546 04/17/12 0124 04/17/12 0515  WBC 9.1  --   --   --   NEUTROABS 6.2  --   --   --   HGB 7.4* 7.4* 8.7* 8.3*  HCT 23.4* 23.1* 25.9*  --   MCV 77.7*  --   --   --   PLT 145*  --   --   --    Cardiac Enzymes: No results found for this basename: CKTOTAL, CKMB, CKMBINDEX, TROPONINI,  in the last 168 hours BNP: No components found with this basename: POCBNP,  CBG: No results found for this basename: GLUCAP,  in the last 168 hours  MRSA PCR SCREENING     Status: None   Collection Time    04/16/12 12:45 PM      Result Value Range Status   MRSA by PCR NEGATIVE  NEGATIVE Final     Studies: Dg Chest Portable 1 View 04/16/2012  *  IMPRESSION: No evidence of acute cardiopulmonary disease.  Chronic interstitial markings/bronchitic changes.   Original Report Authenticated By: Charline Bills, M.D.     Scheduled Meds: . pantoprazole (PROTONIX) IV  40 mg Intravenous Q12H  . sodium chloride  3 mL Intravenous Q12H   Continuous Infusions: . sodium chloride 1,000 mL (04/16/12 0900)

## 2012-04-17 NOTE — Progress Notes (Signed)
Patient arrived to unit at this time,sleepy,orientedx3.Placed patient comfortably in bed.-Elda Dunkerson RN

## 2012-04-17 NOTE — Progress Notes (Signed)
CARE MANAGEMENT NOTE 04/17/2012  Patient:  Clifford [name],Clifford [name]   Account Number:  [id number]  Date Initiated:  04/17/2012  Documentation initiated by:  DAVIS,RHONDA  Subjective/Objective Assessment:   pt iwht brrb, and emesis, hgb 7.4, transfused,     Action/Plan:   home   Anticipated DC Date:  04/20/2012   Anticipated DC Plan:  HOME/SELF CARE  In-house referral  NA      DC Planning Services  NA      PAC Choice  NA   Choice offered to / List presented to:  NA   DME arranged  NA      DME agency  NA     HH arranged  NA      HH agency  NA   Status of service:  In process, will continue to follow Medicare Important Message given?  NA - LOS <3 / Initial given by admissions (If response is "NO", the following Medicare IM given date fields will be blank) Date Medicare IM given:   Date Additional Medicare IM given:    Discharge Disposition:    Per UR Regulation:  Reviewed for med. necessity/level of care/duration of stay  If discussed at Long Length of Stay Meetings, dates discussed:    Comments:  [id number] Earlene Plater, RN, BSN, CCM:  CHART REVIEWED AND UPDATED.  Next chart review due on [id number]. NO DISCHARGE NEEDS PRESENT AT THIS TIME. CASE MANAGEMENT [phone]

## 2012-04-18 LAB — BASIC METABOLIC PANEL
BUN: 20 mg/dL (ref 6–23)
CO2: 26 mEq/L (ref 19–32)
Calcium: 7.6 mg/dL — ABNORMAL LOW (ref 8.4–10.5)
Chloride: 105 mEq/L (ref 96–112)
Creatinine, Ser: 0.67 mg/dL (ref 0.50–1.35)

## 2012-04-18 LAB — CBC
HCT: 24.9 % — ABNORMAL LOW (ref 39.0–52.0)
MCH: 26.5 pg (ref 26.0–34.0)
MCV: 80.6 fL (ref 78.0–100.0)
RBC: 3.09 MIL/uL — ABNORMAL LOW (ref 4.22–5.81)
WBC: 7.6 10*3/uL (ref 4.0–10.5)

## 2012-04-18 MED ORDER — PANTOPRAZOLE SODIUM 40 MG PO TBEC
40.0000 mg | DELAYED_RELEASE_TABLET | Freq: Two times a day (BID) | ORAL | Status: DC
  Administered 2012-04-18 – 2012-04-21 (×6): 40 mg via ORAL
  Filled 2012-04-18 (×9): qty 1

## 2012-04-18 NOTE — Progress Notes (Signed)
Patient ID: Clifford Cook, male   DOB: Nov 06, 1945, 67 y.o.   MRN: [id number]  [facility] PROGRESS NOTE  Clifford Cook WUJ:[id number] DOB: 06-Feb-1945 DOA: 04/16/2012 PCP: Clifford Acre, MD  Brief narrative: 67 y.o. male with a past medical history of a remote ulcer at the age of 63 who presented to [facility] ED 04/16/12 with complaint of having black stools for about 1 week prior to this admission. Patient also had few episodes of coffee ground emesis prior to this admission.   In ED, CBC revealed hemoglobin of 7.4 and hemoccult positive stool. CXR did not reveal acute cardiopulmonary findings.   Assessment/Plan:  Principal Problem:  *Acute blood loss anemia  - per EGD findings probable portal gastropathy - CT abdomen with ascites and portal HTN, ? Need for paracentesis prior to discharge, will see with GI if they agree  - continue protonix 40 mg IV Q 12 hours  - hemoglobin stable in range 7.4 - 8.3  - d/c IVF Active Problems:  Tobacco abuse  -Counseled on tobacco cessation   Ascites with portal HTN - discussed diagnosis with family in detail - limit fluid intake, d/c IVF - monitor I's and O's  Thrombocytopenia - significant drop since admission - hold all heparin products - CBC in AM  Moderate malnutrition - in the setting of chronic illness - diet recommendations provided  Code Status: Full  Family Communication: Family updated at bedside.  Disposition Plan: Home when stable.   Consultants:  Gastroenterology (Dr. Ewing Schlein) Procedures:  EGD 04/17/12 Ct Abdomen Pelvis W Contrast 04/18/2102 1.  Cirrhosis with portal hypertension.  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  2.  No focally suspicious liver lesions identified.  3.  Gastric and proximal colonic wall thickening, likely related to liver disease.  No focal mucosal lesions seen.  4.  Nonobstructing left renal calculus.  5.  Multiple thoracolumbar compression deformities.  A fracture involving the  superior endplate of L4 has sharp margins and appears acute/subacute.  Antibiotics:  None  HPI/Subjective: No events overnight.   Objective: Filed Vitals:   04/17/12 1635 04/17/12 2118 04/17/12 2222 04/18/12 0604  BP:  105/51  132/65  Pulse:  97  93  Temp:  98.1 F (36.7 C)  98.2 F (36.8 C)  TempSrc:  Oral  Oral  Resp:  16  18  Height:      Weight:      SpO2: 96% 92% 95% 95%    Intake/Output Summary (Last 24 hours) at 04/18/12 1129 Last data filed at 04/18/12 0603  Gross per 24 hour  Intake   1305 ml  Output    525 ml  Net    780 ml    Exam:   General:  Pt is alert, follows commands appropriately, not in acute distress  Cardiovascular: Regular rate and rhythm, S1/S2, no murmurs, no rubs, no gallops  Respiratory: Clear to auscultation bilaterally, no wheezing, no crackles, no rhonchi  Abdomen: Soft, non tender, distended with fluid shift, bowel sounds present, no guarding  Extremities: +1-2 bilateral lower extremity pitting edema, pulses DP and PT palpable bilaterally  Neuro: Grossly nonfocal  Data Reviewed: Basic Metabolic Panel:  Recent Labs Lab 04/16/12 0845 04/17/12 0515 04/18/12 0524  NA 135 136 137  K 4.9 3.8 3.6  CL 105 108 105  CO2 23 25 26   GLUCOSE 135* 119* 115*  BUN 26* 25* 20  CREATININE 0.66 0.66 0.67  CALCIUM 7.6* 7.4* 7.6*   Liver Function  Tests:  Recent Labs Lab 04/16/12 0845  AST 35  ALT 17  ALKPHOS 172*  BILITOT 1.3*  PROT 5.8*  ALBUMIN 2.2*   CBC:  Recent Labs Lab 04/16/12 0845 04/16/12 1546 04/17/12 0124 04/17/12 0515 04/17/12 1419 04/17/12 2059 04/18/12 0524  WBC 9.1  --   --   --   --   --  7.6  NEUTROABS 6.2  --   --   --   --   --   --   HGB 7.4* 7.4* 8.7* 8.3* 9.2* 8.7* 8.2*  HCT 23.4* 23.1* 25.9*  --   --   --  24.9*  MCV 77.7*  --   --   --   --   --  80.6  PLT 145*  --   --   --   --   --  77*    Recent Results (from the past 240 hour(s))  MRSA PCR SCREENING     Status: None   Collection Time     04/16/12 12:45 PM      Result Value Range Status   MRSA by PCR NEGATIVE  NEGATIVE Final   Comment:            The GeneXpert MRSA Assay (FDA     approved for NASAL specimens     only), is one component of a     comprehensive MRSA colonization     surveillance program. It is not     intended to diagnose MRSA     infection nor to guide or     monitor treatment for     MRSA infections.     Scheduled Meds: . pantoprazole (PROTONIX) IV  40 mg Intravenous Q12H  . sodium chloride  3 mL Intravenous Q12H   Continuous Infusions:  Debbora Presto, MD  [facility] Pager [phone]  If 7PM-7AM, please contact night-coverage [email or web address] Password [email or web address] 04/18/2012, 11:29 AM   LOS: 2 days

## 2012-04-18 NOTE — Progress Notes (Signed)
Subjective: Some persistent dark stools, seemingly a bit less. No abdominal pain.  Tolerating diet.  Objective: Vital signs in last 24 hours: Temp:  [97.8 F (36.6 C)-98.2 F (36.8 C)] 98.2 F (36.8 C) (04/05 0604) Pulse Rate:  [87-97] 93 (04/05 0604) Resp:  [16-19] 18 (04/05 0604) BP: (105-132)/(51-72) 132/65 mmHg (04/05 0604) SpO2:  [92 %-100 %] 95 % (04/05 0604) Weight:  [108.636 kg (239 lb 8 oz)] 108.636 kg (239 lb 8 oz) (04/04 1633) Weight change: 3.636 kg (8 lb 0.3 oz) Last BM Date: 04/17/12  PE: GEN:  NAD, older-appearing than stated age, chronically ill-appearing ABD:  Soft, protuberant, no palpable hepatosplenomegaly.  Lab Results: CBC    Component Value Date/Time   WBC 7.6 04/18/2012 0524   RBC 3.09* 04/18/2012 0524   HGB 8.2* 04/18/2012 0524   HCT 24.9* 04/18/2012 0524   PLT 77* 04/18/2012 0524   MCV 80.6 04/18/2012 0524   MCH 26.5 04/18/2012 0524   MCHC 32.9 04/18/2012 0524   RDW 19.6* 04/18/2012 0524   LYMPHSABS 1.7 04/16/2012 0845   MONOABS 0.8 04/16/2012 0845   EOSABS 0.3 04/16/2012 0845   BASOSABS 0.1 04/16/2012 0845   CMP     Component Value Date/Time   NA 137 04/18/2012 0524   K 3.6 04/18/2012 0524   CL 105 04/18/2012 0524   CO2 26 04/18/2012 0524   GLUCOSE 115* 04/18/2012 0524   BUN 20 04/18/2012 0524   CREATININE 0.67 04/18/2012 0524   CALCIUM 7.6* 04/18/2012 0524   PROT 5.8* 04/16/2012 0845   ALBUMIN 2.2* 04/16/2012 0845   AST 35 04/16/2012 0845   ALT 17 04/16/2012 0845   ALKPHOS 172* 04/16/2012 0845   BILITOT 1.3* 04/16/2012 0845   GFRNONAA >90 04/18/2012 0524   GFRAA >90 04/18/2012 0524   Assessment:  1.  GI bleeding.  Suspect from portal gastropathy.  Perhaps even from (now) deflated gastric varices not evident on yesterday's endoscopy. 2.  Cirrhosis, based on clinical data (portal gastropathy, hypoalbuminemia, imaging studies).  Plan:  1.  Change Pantoprazole to 40 mg po bid and continue for 8 weeks. 2.  No NSAIDs. 3.  No alcohol. 4.  I suspect patient has component of COPD, and  don't think he would be good candidate for propranolol (non-specific beta blocker therapy for his portal hypertension) in light of this and his periodic wheezing. 5.  I have discussed with patient what cirrhosis is, and have counseled him that he will need longterm and regular outpatient follow-up with gastroenterologist for this. 6.  If patient continues to do well, would likely discharge him tomorrow from GI perspective, and would arrange outpatient follow-up with Dr. Ewing Schlein. 7.  Will sign-off; please call back with any questions.   Clifford Cook 04/18/2012, 12:51 PM

## 2012-04-18 NOTE — Progress Notes (Signed)
Patient had two small, black bowel movements over the course of the night.

## 2012-04-19 LAB — CBC
HCT: 22.7 % — ABNORMAL LOW (ref 39.0–52.0)
MCH: 26.3 pg (ref 26.0–34.0)
MCHC: 32.6 g/dL (ref 30.0–36.0)
MCV: 80.8 fL (ref 78.0–100.0)
Platelets: 76 10*3/uL — ABNORMAL LOW (ref 150–400)
RDW: 19.6 % — ABNORMAL HIGH (ref 11.5–15.5)

## 2012-04-19 LAB — BASIC METABOLIC PANEL
BUN: 13 mg/dL (ref 6–23)
Calcium: 7.3 mg/dL — ABNORMAL LOW (ref 8.4–10.5)
Chloride: 100 mEq/L (ref 96–112)
Creatinine, Ser: 0.65 mg/dL (ref 0.50–1.35)
GFR calc Af Amer: >90 mL/min (ref >90)
GFR calc non Af Amer: >90 mL/min (ref >90)

## 2012-04-19 MED ORDER — ENSURE COMPLETE PO LIQD
237.0000 mL | Freq: Three times a day (TID) | ORAL | Status: DC
  Administered 2012-04-20 (×3): 237 mL via ORAL

## 2012-04-19 MED ORDER — HYDROXYZINE HCL 25 MG PO TABS
25.0000 mg | ORAL_TABLET | Freq: Four times a day (QID) | ORAL | Status: DC | PRN
  Administered 2012-04-20: 25 mg via ORAL
  Filled 2012-04-19: qty 1

## 2012-04-19 MED ORDER — SENNA 8.6 MG PO TABS
1.0000 | ORAL_TABLET | Freq: Two times a day (BID) | ORAL | Status: DC
  Administered 2012-04-19 – 2012-04-21 (×5): 8.6 mg via ORAL
  Filled 2012-04-19 (×5): qty 1

## 2012-04-19 MED ORDER — ZOLPIDEM TARTRATE 5 MG PO TABS: 5.0000 mg | ORAL_TABLET | Freq: Every evening | ORAL | Status: DC | PRN

## 2012-04-19 MED ORDER — POTASSIUM CHLORIDE CRYS ER 20 MEQ PO TBCR
40.0000 meq | EXTENDED_RELEASE_TABLET | Freq: Two times a day (BID) | ORAL | Status: AC
  Administered 2012-04-19 – 2012-04-20 (×2): 40 meq via ORAL
  Filled 2012-04-19 (×2): qty 2

## 2012-04-19 MED ORDER — POLYETHYLENE GLYCOL 3350 17 G PO PACK
17.0000 g | PACK | Freq: Every day | ORAL | Status: DC
  Administered 2012-04-19 – 2012-04-21 (×3): 17 g via ORAL
  Filled 2012-04-19 (×3): qty 1

## 2012-04-19 MED ORDER — DIPHENHYDRAMINE HCL 25 MG PO CAPS: 25.0000 mg | ORAL_CAPSULE | Freq: Every evening | ORAL | Status: DC | PRN

## 2012-04-19 NOTE — Progress Notes (Signed)
Patient ID: Clifford Cook, male   DOB: 1945-05-09, 67 y.o.   MRN: [id number]  [facility] PROGRESS NOTE  Jantzen Pilger WUJ:[id number] DOB: 03-Jul-1945 DOA: 04/16/2012 PCP: Gretel Acre, MD  Brief narrative:  67 y.o. male with a past medical history of a remote ulcer at the age of 43 who presented to [facility] ED 04/16/12 with complaint of having black stools for about 1 week prior to this admission. Patient also had few episodes of coffee ground emesis prior to this admission.   In ED, CBC revealed hemoglobin of 7.4 and hemoccult positive stool. CXR did not reveal acute cardiopulmonary findings.   Assessment/Plan:  Principal Problem:  *Acute blood loss anemia  - per EGD findings probable portal gastropathy  - CT abdomen with ascites and portal HTN - Hg continues to drop, will plan on transfusion one unit of PRBC today  - continue protonix 40 mg IV Q 12 hours  Active Problems:  Tobacco abuse  -Counseled on tobacco cessation  Ascites with portal HTN  - discussed diagnosis with family in detail, questions answered to their satisfaction  - limit fluid intake, d/c IVF  - monitor I's and O's  Thrombocytopenia  - significant drop since admission, pt denies any active bleeding   - hold all heparin products  - CBC in AM  Moderate malnutrition  - in the setting of chronic illness  - diet recommendations provided   Hypokalemia - will supplement today   Code Status: Full  Family Communication: Family updated at bedside.  Disposition Plan: Home when stable.   Consultants:  Gastroenterology  Procedures:  EGD 04/17/12  Ct Abdomen Pelvis W Contrast 04/18/2102 1. Cirrhosis with portal hypertension. 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  2. No focally suspicious liver lesions identified.  3. Gastric and proximal colonic wall thickening, likely related to liver disease. No focal mucosal lesions seen.  4. Nonobstructing left renal calculus.  5. Multiple thoracolumbar  compression deformities. A fracture involving the superior endplate of L4 has sharp margins and appears acute/subacute.  Antibiotics:   None  HPI/Subjective: No events overnight. Pt is worried about persistent Hg drop.   Objective: Filed Vitals:   04/18/12 0604 04/18/12 1355 04/18/12 2124 04/19/12 0620  BP: 132/65 120/64 124/59 117/65  Pulse: 93 91 92 89  Temp: 98.2 F (36.8 C) 98.2 F (36.8 C) 98 F (36.7 C) 98 F (36.7 C)  TempSrc: Oral Oral Oral Oral  Resp: 18 18 18 16   Height:      Weight:      SpO2: 95% 95% 95% 96%    Intake/Output Summary (Last 24 hours) at 04/19/12 1543 Last data filed at 04/18/12 1742  Gross per 24 hour  Intake    480 ml  Output    400 ml  Net     80 ml   Exam:   General:  Pt is alert, follows commands appropriately, not in acute distress  Cardiovascular: Regular rate and rhythm, S1/S2, no murmurs, no rubs, no gallops  Respiratory: Clear to auscultation bilaterally, no wheezing, no crackles, no rhonchi  Abdomen: Soft, non tender, slightly distended, bowel sounds present, no guarding  Extremities: +2 bilateral pitting edema, pulses DP and PT palpable bilaterally  Neuro: Grossly nonfocal  Data Reviewed: Basic Metabolic Panel:  Recent Labs Lab 04/16/12 0845 04/17/12 0515 04/18/12 0524 04/19/12 0404  NA 135 136 137 131*  K 4.9 3.8 3.6 3.1*  CL 105 108 105 100  CO2 23 25 26  26  GLUCOSE 135* 119* 115* 126*  BUN 26* 25* 20 13  CREATININE 0.66 0.66 0.67 0.65  CALCIUM 7.6* 7.4* 7.6* 7.3*   Liver Function Tests:  Recent Labs Lab 04/16/12 0845  AST 35  ALT 17  ALKPHOS 172*  BILITOT 1.3*  PROT 5.8*  ALBUMIN 2.2*   CBC:  Recent Labs Lab 04/16/12 0845 04/16/12 1546 04/17/12 0124 04/17/12 0515 04/17/12 1419 04/17/12 2059 04/18/12 0524 04/19/12 0404  WBC 9.1  --   --   --   --   --  7.6 6.1  NEUTROABS 6.2  --   --   --   --   --   --   --   HGB 7.4* 7.4* 8.7* 8.3* 9.2* 8.7* 8.2* 7.4*  HCT 23.4* 23.1* 25.9*  --   --    --  24.9* 22.7*  MCV 77.7*  --   --   --   --   --  80.6 80.8  PLT 145*  --   --   --   --   --  77* 76*   Recent Results (from the past 240 hour(s))  MRSA PCR SCREENING     Status: None   Collection Time    04/16/12 12:45 PM      Result Value Range Status   MRSA by PCR NEGATIVE  NEGATIVE Final   Comment:            The GeneXpert MRSA Assay (FDA     approved for NASAL specimens     only), is one component of a     comprehensive MRSA colonization     surveillance program. It is not     intended to diagnose MRSA     infection nor to guide or     monitor treatment for     MRSA infections.     Scheduled Meds: . feeding supplement  237 mL Oral TID WC  . pantoprazole  40 mg Oral BID AC  . polyethylene glycol  17 g Oral Daily  . senna  1 tablet Oral BID  . sodium chloride  3 mL Intravenous Q12H   Continuous Infusions:    Debbora Presto, MD  [facility] Pager [phone]  If 7PM-7AM, please contact night-coverage [email or web address] Password [email or web address] 04/19/2012, 3:43 PM   LOS: 3 days

## 2012-04-20 ENCOUNTER — Encounter (HOSPITAL_COMMUNITY): Payer: Self-pay | Admitting: Gastroenterology

## 2012-04-20 LAB — TYPE AND SCREEN
Antibody Screen: NEGATIVE
Unit division: 0
Unit division: 0
Unit division: 0

## 2012-04-20 LAB — CBC
Hemoglobin: 8.9 g/dL — ABNORMAL LOW (ref 13.0–17.0)
MCH: 27.4 pg (ref 26.0–34.0)
MCHC: 33.7 g/dL (ref 30.0–36.0)
RDW: 19.6 % — ABNORMAL HIGH (ref 11.5–15.5)

## 2012-04-20 LAB — BASIC METABOLIC PANEL
Calcium: 7.6 mg/dL — ABNORMAL LOW (ref 8.4–10.5)
GFR calc Af Amer: >90 mL/min (ref >90)
GFR calc non Af Amer: >90 mL/min (ref >90)
Glucose, Bld: 117 mg/dL — ABNORMAL HIGH (ref 70–99)
Sodium: 134 mEq/L — ABNORMAL LOW (ref 135–145)

## 2012-04-20 MED ORDER — POTASSIUM CHLORIDE CRYS ER 20 MEQ PO TBCR
40.0000 meq | EXTENDED_RELEASE_TABLET | Freq: Two times a day (BID) | ORAL | Status: AC
  Administered 2012-04-20 – 2012-04-21 (×2): 40 meq via ORAL
  Filled 2012-04-20 (×2): qty 2

## 2012-04-20 MED ORDER — BISACODYL 10 MG RE SUPP
10.0000 mg | Freq: Once | RECTAL | Status: AC
  Administered 2012-04-20: 10 mg via RECTAL
  Filled 2012-04-20: qty 1

## 2012-04-20 NOTE — Progress Notes (Signed)
Patient ID: Clifford Cook, male   DOB: 1945-07-16, 67 y.o.   MRN: [id number]  [facility] PROGRESS NOTE  Clifford Cook YQM:[id number] DOB: [birth year] DOA: 04/16/2012 PCP: Clifford Acre, MD   Brief narrative:  67 y.o. male with a past medical history of a remote ulcer at the age of 55 who presented to [facility] 04/16/12 with complaint of having black stools for about 1 week prior to this admission. Patient also had few episodes of coffee ground emesis prior to this admission.   In ED, CBC revealed hemoglobin of 7.4 and hemoccult positive stool. CXR did not reveal acute cardiopulmonary findings.   Assessment/Plan:  Principal Problem:  *Acute blood loss anemia  - per EGD findings probable portal gastropathy  - CT abdomen with ascites and portal HTN  - Patient has received 2 units of PRBC on 04/16/2012, hemoglobin persistently dropping since that time, patient has required another one unit of PRBC transfusion 04/19/2012 - Hemoglobin has appropriately increased after the last transfusion (04/19/2012), patient reports black stools but had none for 2 days due to constipation - I have discussed with gastroenterologist on call, recommendation was to obtain Hemoccult stool to check for presence of blood and to examine the color of the stool, patient may need colonoscopy  - continue protonix 40 mg IV Q 12 hours  Active Problems:  Tobacco abuse  -Counseled on tobacco cessation  Ascites with portal HTN  - discussed diagnosis with family in detail, questions answered to their satisfaction  - limit fluid intake - monitor I's and O's  Thrombocytopenia  - significant drop since admission, pt denies any active bleeding  - continue to  hold all heparin products  - CBC in AM  Moderate malnutrition  - in the setting of chronic illness  - diet recommendations provided  Hypokalemia  - will supplement today and repeat BMP in AM  Code Status: Full  Family Communication: Family updated at bedside.  Disposition  Plan: Home when stable.   Consultants:  Gastroenterology  Procedures:  EGD 04/17/12  Ct Abdomen Pelvis W Contrast 04/18/2102 1. Cirrhosis with portal hypertension. 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  2. No focally suspicious liver lesions identified.  3. Gastric and proximal colonic wall thickening, likely related to liver disease. No focal mucosal lesions seen.  4. Nonobstructing left renal calculus.  5. Multiple thoracolumbar compression deformities. A fracture involving the superior endplate of L4 has sharp margins and appears acute/subacute.  Antibiotics:   None  HPI/Subjective: No events overnight.   Objective: Filed Vitals:   04/19/12 1947 04/19/12 2147 04/20/12 0559 04/20/12 1321  BP: 110/69 119/64 111/49 105/58  Pulse: 88 89 89 90  Temp: 98.5 F (36.9 C) 97.9 F (36.6 C) 98 F (36.7 C) 98.2 F (36.8 C)  TempSrc: Oral Oral Oral Oral  Resp: 16 18 16 16   Height:      Weight:      SpO2: 96% 96% 92% 95%    Intake/Output Summary (Last 24 hours) at 04/20/12 1610 Last data filed at 04/20/12 1009  Gross per 24 hour  Intake    966 ml  Output    950 ml  Net     16 ml    Exam:   General:  Pt is alert, follows commands appropriately, not in acute distress  Cardiovascular: Regular rate and rhythm, S1/S2, no murmurs, no rubs, no gallops  Respiratory: Clear to auscultation bilaterally, no wheezing, no crackles, no rhonchi  Abdomen: Soft,  non tender, slightly distended, bowel sounds present, no guarding  Extremities: +1-2 bilateral lower extremity pitting edema, pulses DP and PT palpable bilaterally  Neuro: Grossly nonfocal  Data Reviewed: Basic Metabolic Panel:  Recent Labs Lab 04/16/12 0845 04/17/12 0515 04/18/12 0524 04/19/12 0404 04/20/12 0400  NA 135 136 137 131* 134*  K 4.9 3.8 3.6 3.1* 3.2*  CL 105 108 105 100 102  CO2 23 25 26 26 27   GLUCOSE 135* 119* 115* 126* 117*  BUN 26* 25* 20 13 10   CREATININE 0.66  0.66 0.67 0.65 0.67  CALCIUM 7.6* 7.4* 7.6* 7.3* 7.6*   Liver Function Tests:  Recent Labs Lab 04/16/12 0845  AST 35  ALT 17  ALKPHOS 172*  BILITOT 1.3*  PROT 5.8*  ALBUMIN 2.2*   CBC:  Recent Labs Lab 04/16/12 0845 04/16/12 1546 04/17/12 0124  04/17/12 1419 04/17/12 2059 04/18/12 0524 04/19/12 0404 04/20/12 0400  WBC 9.1  --   --   --   --   --  7.6 6.1 5.7  NEUTROABS 6.2  --   --   --   --   --   --   --   --   HGB 7.4* 7.4* 8.7*  < > 9.2* 8.7* 8.2* 7.4* 8.9*  HCT 23.4* 23.1* 25.9*  --   --   --  24.9* 22.7* 26.4*  MCV 77.7*  --   --   --   --   --  80.6 80.8 81.2  PLT 145*  --   --   --   --   --  77* 76* 89*  < > = values in this interval not displayed.   Recent Results (from the past 240 hour(s))  MRSA PCR SCREENING     Status: None   Collection Time    04/16/12 12:45 PM      Result Value Range Status   MRSA by PCR NEGATIVE  NEGATIVE Final   Comment:            The GeneXpert MRSA Assay (FDA     approved for NASAL specimens     only), is one component of a     comprehensive MRSA colonization     surveillance program. It is not     intended to diagnose MRSA     infection nor to guide or     monitor treatment for     MRSA infections.     Scheduled Meds: . feeding supplement  237 mL Oral TID WC  . pantoprazole  40 mg Oral BID AC  . polyethylene glycol  17 g Oral Daily  . senna  1 tablet Oral BID  . sodium chloride  3 mL Intravenous Q12H   Continuous Infusions:    Debbora Presto, MD  [facility] Pager [phone]  If 7PM-7AM, please contact night-coverage [email or web address] Password [email or web address] 04/20/2012, 4:10 PM   LOS: 4 days

## 2012-04-21 LAB — CBC
MCH: 28.2 pg (ref 26.0–34.0)
MCHC: 34.5 g/dL (ref 30.0–36.0)
MCV: 81.7 fL (ref 78.0–100.0)
Platelets: 79 10*3/uL — ABNORMAL LOW (ref 150–400)

## 2012-04-21 LAB — BASIC METABOLIC PANEL
Calcium: 7.6 mg/dL — ABNORMAL LOW (ref 8.4–10.5)
Creatinine, Ser: 0.69 mg/dL (ref 0.50–1.35)
GFR calc non Af Amer: >90 mL/min (ref >90)
Sodium: 133 mEq/L — ABNORMAL LOW (ref 135–145)

## 2012-04-21 MED ORDER — HYDROMORPHONE HCL 2 MG PO TABS: 2.0000 mg | ORAL_TABLET | Freq: Two times a day (BID) | ORAL | Status: DC | PRN

## 2012-04-21 MED ORDER — PANTOPRAZOLE SODIUM 40 MG PO TBEC: 40.0000 mg | DELAYED_RELEASE_TABLET | Freq: Two times a day (BID) | ORAL | Status: DC

## 2012-04-21 MED ORDER — FUROSEMIDE 40 MG PO TABS: 20.0000 mg | ORAL_TABLET | Freq: Every day | ORAL | Status: DC

## 2012-04-21 MED ORDER — ENSURE COMPLETE PO LIQD: 237.0000 mL | Freq: Three times a day (TID) | ORAL | Status: DC

## 2012-04-21 MED ORDER — ONDANSETRON HCL 4 MG PO TABS: 4.0000 mg | ORAL_TABLET | Freq: Four times a day (QID) | ORAL | Status: DC | PRN

## 2012-04-21 MED ORDER — ZOLPIDEM TARTRATE 5 MG PO TABS: 5.0000 mg | ORAL_TABLET | Freq: Every evening | ORAL | Status: DC | PRN

## 2012-04-21 MED ORDER — POTASSIUM CHLORIDE ER 10 MEQ PO TBCR: 20.0000 meq | EXTENDED_RELEASE_TABLET | Freq: Two times a day (BID) | ORAL | Status: DC

## 2012-04-21 MED ORDER — SENNA 8.6 MG PO TABS: 1.0000 | ORAL_TABLET | Freq: Two times a day (BID) | ORAL | Status: DC

## 2012-04-21 MED ORDER — HYDROXYZINE HCL 25 MG PO TABS: 25.0000 mg | ORAL_TABLET | Freq: Four times a day (QID) | ORAL | Status: DC | PRN

## 2012-04-21 MED ORDER — POLYETHYLENE GLYCOL 3350 17 G PO PACK: 17.0000 g | PACK | Freq: Every day | ORAL | Status: DC

## 2012-04-21 MED ORDER — FUROSEMIDE 40 MG PO TABS: 40.0000 mg | ORAL_TABLET | Freq: Every day | ORAL | Status: DC

## 2012-04-21 NOTE — Progress Notes (Signed)
Evaluated patient for Link to [employer] as a benefit of having [employer]. 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 Discussed program and its benefits. [name] reports at this time she does not think they have any Link to [facility] Care Management needs. Left contact information for wife to call if needed. Appreciative of visit.  Clifford Noble, MSN- Ed, RN,BSN, [facility], [phone]

## 2012-04-21 NOTE — Care Management (Signed)
Cm spoke with patient with spouse present at bedside concerning discharge planning. Pt states independent PTA. Pt's wife assist in home care. Pt in on RA during consult. PCP: Gretel Acre, MD .   Leonie Green [phone]

## 2012-04-21 NOTE — Discharge Summary (Signed)
Physician Discharge Summary  Clifford Cook QMV:[id number] DOB: 1946/01/03 DOA: 04/16/2012  PCP: Gretel Acre, MD  Admit date: 04/16/2012 Discharge date: 04/21/2012  Recommendations for Outpatient Follow-up:  1. Pt will need to follow up with PCP in 2-3 weeks post discharge 2. Please obtain BMP to evaluate electrolytes and kidney function, potassium level needs to be checked as well 3. Please note that pt was started on Lasix 20 mg PO QD along with potassium supplementation  4. Please also check CBC to evaluate Hg and Hct levels 5. Pt will need to schedule follow up appointment with GI specialist as recommended by Dr. Matthias Hughs   Discharge Diagnoses: Acute blood loss anemia  Principal Problem:   GI bleed with acute blood loss anemia Active Problems:   Tobacco abuse  Discharge Condition: Stable  Diet recommendation: 2 gram sodium diet discussed in detail over 45 minutes  Brief narrative:  67 y.o. male with a past medical history of a remote ulcer at the age of 67 who presented to [facility] ED 04/16/12 with complaint of having black stools for about 1 week prior to this admission. Patient also had few episodes of coffee ground emesis prior to this admission.   In ED, CBC revealed hemoglobin of 7.4 and hemoccult positive stool. CXR did not reveal acute cardiopulmonary findings.   Assessment/Plan:  Principal Problem:  *Acute blood loss anemia  - per EGD findings probable portal gastropathy  - CT abdomen with ascites and portal HTN  - Patient has received 2 units of PRBC on 04/16/2012, hemoglobin persistently dropping since that time, patient has required another one unit of PRBC transfusion 04/19/2012  - Hemoglobin has appropriately increased after the last transfusion (04/19/2012), patient reports black stools but had none for 2 days due to constipation  - I have discussed with gastroenterologist on call, recommendation was to obtain Hemoccult stool to check for presence of blood and to examine the color  of the stool, FOBT negative for blood, Hg this Am stable, no drop, no acute need for colonoscopy  - continue protonix 40 mg IV Q 12 hours  Active Problems:  Tobacco abuse  -Counseled on tobacco cessation  Ascites with portal HTN  - discussed diagnosis with family in detail, questions answered to their satisfaction  - limit fluid intake  Thrombocytopenia  - significant drop since admission, pt denies any active bleeding  - continued to hold all heparin products  Moderate malnutrition  - in the setting of chronic illness  - diet recommendations provided  Hypokalemia  - will supplement today and repeat BMP in PCP office in 1-2 weeks   Code Status: Full  Family Communication: Family updated at bedside.    Consultants:  Gastroenterology  Procedures:  EGD 04/17/12  Ct Abdomen Pelvis W Contrast 04/18/2102 1. Cirrhosis with portal hypertension. 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  2. No focally suspicious liver lesions identified.  3. Gastric and proximal colonic wall thickening, likely related to liver disease. No focal mucosal lesions seen.  4. Nonobstructing left renal calculus.  5. Multiple thoracolumbar compression deformities. A fracture involving the superior endplate of L4 has sharp margins and appears acute/subacute.  Antibiotics:  None Discharge Exam: Filed Vitals:   04/21/12 0425  BP: 109/54  Pulse: 89  Temp: 98.2 F (36.8 C)  Resp: 18   Filed Vitals:   04/20/12 0559 04/20/12 1321 04/20/12 2106 04/21/12 0425  BP: 111/49 105/58 103/53 109/54  Pulse: 89 90 92 89  Temp: 98  F (36.7 C) 98.2 F (36.8 C) 97.9 F (36.6 C) 98.2 F (36.8 C)  TempSrc: Oral Oral Oral Oral  Resp: 16 16 18 18   Height:      Weight:      SpO2: 92% 95% 92% 95%    General: Pt is alert, follows commands appropriately, not in acute distress Cardiovascular: Regular rate and rhythm, S1/S2 +, no murmurs, no rubs, no gallops Respiratory: Clear to auscultation  bilaterally, no wheezing, no crackles, no rhonchi Abdominal: Soft, non tender, distended, bowel sounds +, no guarding Extremities: +2 bilateral pitting edema, no cyanosis, pulses palpable bilaterally DP and PT, chronic venous stasis changes  Neuro: Grossly nonfocal  Discharge Instructions  Discharge Orders   Future Orders Complete By Expires     Diet - low sodium heart healthy  As directed     Increase activity slowly  As directed         Medication List    STOP taking these medications       naproxen sodium 220 MG tablet  Commonly known as:  ANAPROX      TAKE these medications       feeding supplement Liqd  Take 237 mLs by mouth 3 (three) times daily with meals.     furosemide 40 MG tablet  Commonly known as:  LASIX  Take 0.5 tablets (20 mg total) by mouth daily.     HYDROmorphone 2 MG tablet  Commonly known as:  DILAUDID  Take 1 tablet (2 mg total) by mouth every 12 (twelve) hours as needed for pain.     hydrOXYzine 25 MG tablet  Commonly known as:  ATARAX/VISTARIL  Take 1 tablet (25 mg total) by mouth every 6 (six) hours as needed for itching.     ondansetron 4 MG tablet  Commonly known as:  ZOFRAN  Take 1 tablet (4 mg total) by mouth every 6 (six) hours as needed for nausea.     pantoprazole 40 MG tablet  Commonly known as:  PROTONIX  Take 1 tablet (40 mg total) by mouth 2 (two) times daily before a meal.     polyethylene glycol packet  Commonly known as:  MIRALAX / GLYCOLAX  Take 17 g by mouth daily.     potassium chloride 10 MEQ tablet  Commonly known as:  K-DUR  Take 2 tablets (20 mEq total) by mouth 2 (two) times daily.     senna 8.6 MG Tabs  Commonly known as:  SENOKOT  Take 1 tablet (8.6 mg total) by mouth 2 (two) times daily.     zolpidem 5 MG tablet  Commonly known as:  AMBIEN  Take 1 tablet (5 mg total) by mouth at bedtime as needed for sleep.           Follow-up Information   Follow up with [facility], MD On 06/01/2012. (appointmetn  scheduled at 1 pm)    Contact information:   [address]                         [place] [address] [phone]        The results of significant diagnostics from this hospitalization (including imaging, microbiology, ancillary and laboratory) are listed below for reference.     Microbiology: Recent Results (from the past 240 hour(s))  MRSA PCR SCREENING     Status: None   Collection Time    04/16/12 12:45 PM  Result Value Range Status   MRSA by PCR NEGATIVE  NEGATIVE Final   Comment:            The GeneXpert MRSA Assay (FDA     approved for NASAL specimens     only), is one component of a     comprehensive MRSA colonization     surveillance program. It is not     intended to diagnose MRSA     infection nor to guide or     monitor treatment for     MRSA infections.     Labs: Basic Metabolic Panel:  Recent Labs Lab 04/17/12 0515 04/18/12 0524 04/19/12 0404 04/20/12 0400 04/21/12 0421  NA 136 137 131* 134* 133*  K 3.8 3.6 3.1* 3.2* 3.4*  CL 108 105 100 102 101  CO2 25 26 26 27 28   GLUCOSE 119* 115* 126* 117* 140*  BUN 25* 20 13 10 7   CREATININE 0.66 0.67 0.65 0.67 0.69  CALCIUM 7.4* 7.6* 7.3* 7.6* 7.6*   Liver Function Tests:  Recent Labs Lab 04/16/12 0845  AST 35  ALT 17  ALKPHOS 172*  BILITOT 1.3*  PROT 5.8*  ALBUMIN 2.2*   No results found for this basename: LIPASE, AMYLASE,  in the last 168 hours No results found for this basename: AMMONIA,  in the last 168 hours CBC:  Recent Labs Lab 04/16/12 0845  04/17/12 0124  04/17/12 2059 04/18/12 0524 04/19/12 0404 04/20/12 0400 04/21/12 0421  WBC 9.1  --   --   --   --  7.6 6.1 5.7 4.9  NEUTROABS 6.2  --   --   --   --   --   --   --   --   HGB 7.4*  < > 8.7*  < > 8.7* 8.2* 7.4* 8.9* 8.8*  HCT 23.4*  < > 25.9*  --   --  24.9* 22.7* 26.4* 25.5*  MCV 77.7*  --   --   --   --  80.6 80.8 81.2 81.7  PLT 145*  --   --   --   --  77* 76* 89* 79*  < > = values in  this interval not displayed. Cardiac Enzymes: No results found for this basename: CKTOTAL, CKMB, CKMBINDEX, TROPONINI,  in the last 168 hours BNP: BNP (last 3 results) No results found for this basename: PROBNP,  in the last 8760 hours CBG: No results found for this basename: GLUCAP,  in the last 168 hours   SIGNED: Time coordinating discharge: Over 30 minutes  Debbora Presto, MD  [facility] 04/21/2012, 12:13 PM Pager [phone]  If 7PM-7AM, please contact night-coverage [email or web address] Password [email or web address]

## 2012-04-21 NOTE — Progress Notes (Signed)
Spoke with spouse, Darl Pikes. She is feeling good about the care Brady has received and they are both happy to be going home. Presence; listening.

## 2012-04-21 NOTE — Plan of Care (Signed)
Problem: Food- and Nutrition-Related Knowledge Deficit (NB-1.1) Goal: Nutrition education Formal process to instruct or train a patient/client in a skill or to impart knowledge to help patients/clients voluntarily manage or modify food choices and eating behavior to maintain or improve health. Outcome: Completed/Met Date Met:  04/21/12 Met with pt and wife to discuss low sodium diet for ascites. Discussed sources of sodium in the diet. Discouraged use of salt and talked about alternate options for salt-free seasonings. Sample meals discussed. Teach back method used. Handouts provided. Expect good compliance.

## 2012-04-21 NOTE — Progress Notes (Signed)
Patient seen again at family request. Stool yesterday, per Dr. Izola Price' rectal exam, was non-melenic in character and was actually Hemoccult-negative.  Patient's hemoglobin has remained stable overnight since transfusion yesterday.  At this time, the patient feels a little bit "puffy" with abdominal distention related to ascites. Dr. Izola Price has discussed with him the importance of sodium restriction. He indicates he has not had alcohol for several months.  Exam: The patient is lying in bed in no distress. No respiratory distress. Abdomen is moderately distended. Chest is clear although breath sounds do sound somewhat diminished at the left base. Abdomen has firm but not extremely tense ascites with flank dullness. No evident jaundice. Mentation is appropriate.  Impression:  1. Recent GI bleed with posthemorrhagic anemia, most likely on the basis of portal hypertensive gastropathy. No evidence of current bleeding.  2. Ascites related to portal hypertension  Recommendations:  1. I will plan to sign off at this time  2. Discharge today is appropriate from the GI perspective, if the attending physician feels comfortable with that  3. In the meantime, I have ordered for a dietitian consult to instruct the patient in a 2 g sodium restricted diet  4. I have arranged outpatient followup for the patient with Dr.Magod, May 19 at 1 PM, who did his consult and upper endoscopy procedure, to address issues regarding colonoscopy (the patient has not had one for many, many years), etiology and staging of his liver disease, and monitoring of ascites.  Florencia Reasons, M.D. [phone]

## 2012-04-21 NOTE — Progress Notes (Signed)
Patient discharge home, all discharge medications and instructions reviewed and questions answered. Patient to be assisted to vehicle by wheelchair.

## 2012-08-20 ENCOUNTER — Ambulatory Visit: Payer: 59 | Attending: Family Medicine | Admitting: Physical Therapy

## 2012-08-20 DIAGNOSIS — M545 Low back pain, unspecified: Secondary | ICD-10-CM | POA: Insufficient documentation

## 2012-08-20 DIAGNOSIS — IMO0001 Reserved for inherently not codable concepts without codable children: Secondary | ICD-10-CM | POA: Insufficient documentation

## 2012-08-20 DIAGNOSIS — R5381 Other malaise: Secondary | ICD-10-CM | POA: Insufficient documentation

## 2012-08-24 ENCOUNTER — Ambulatory Visit: Payer: 59 | Admitting: Physical Therapy

## 2012-08-26 ENCOUNTER — Ambulatory Visit: Payer: 59 | Admitting: Physical Therapy

## 2012-08-31 ENCOUNTER — Ambulatory Visit: Payer: 59 | Admitting: Physical Therapy

## 2012-09-02 ENCOUNTER — Ambulatory Visit: Payer: 59 | Admitting: Physical Therapy

## 2012-09-07 ENCOUNTER — Ambulatory Visit: Payer: 59 | Admitting: Physical Therapy

## 2012-09-09 ENCOUNTER — Ambulatory Visit: Payer: 59 | Admitting: Physical Therapy

## 2012-09-15 ENCOUNTER — Ambulatory Visit: Payer: 59 | Attending: Family Medicine | Admitting: Physical Therapy

## 2012-09-15 DIAGNOSIS — M545 Low back pain, unspecified: Secondary | ICD-10-CM | POA: Insufficient documentation

## 2012-09-15 DIAGNOSIS — R5381 Other malaise: Secondary | ICD-10-CM | POA: Insufficient documentation

## 2012-09-15 DIAGNOSIS — IMO0001 Reserved for inherently not codable concepts without codable children: Secondary | ICD-10-CM | POA: Insufficient documentation

## 2012-09-17 ENCOUNTER — Ambulatory Visit: Payer: 59 | Admitting: Physical Therapy

## 2012-09-21 ENCOUNTER — Ambulatory Visit: Payer: 59 | Admitting: Physical Therapy

## 2012-09-23 ENCOUNTER — Ambulatory Visit: Payer: 59 | Admitting: Physical Therapy

## 2013-01-13 ENCOUNTER — Emergency Department (HOSPITAL_COMMUNITY): Payer: 59

## 2013-01-13 ENCOUNTER — Encounter (HOSPITAL_COMMUNITY): Payer: Self-pay | Admitting: Emergency Medicine

## 2013-01-13 ENCOUNTER — Emergency Department (HOSPITAL_COMMUNITY)
Admission: EM | Admit: 2013-01-13 | Discharge: 2013-01-13 | Disposition: A | Discharge status: Home / Self Care | Payer: 59 | Attending: Emergency Medicine | Admitting: Emergency Medicine

## 2013-01-13 DIAGNOSIS — Z880 Allergy status to penicillin: Secondary | ICD-10-CM | POA: Insufficient documentation

## 2013-01-13 DIAGNOSIS — R002 Palpitations: Secondary | ICD-10-CM | POA: Insufficient documentation

## 2013-01-13 DIAGNOSIS — R110 Nausea: Secondary | ICD-10-CM | POA: Insufficient documentation

## 2013-01-13 DIAGNOSIS — Z8711 Personal history of peptic ulcer disease: Secondary | ICD-10-CM | POA: Insufficient documentation

## 2013-01-13 DIAGNOSIS — Z7983 Long term (current) use of bisphosphonates: Secondary | ICD-10-CM | POA: Insufficient documentation

## 2013-01-13 DIAGNOSIS — Z79899 Other long term (current) drug therapy: Secondary | ICD-10-CM | POA: Insufficient documentation

## 2013-01-13 DIAGNOSIS — R197 Diarrhea, unspecified: Secondary | ICD-10-CM

## 2013-01-13 DIAGNOSIS — F17200 Nicotine dependence, unspecified, uncomplicated: Secondary | ICD-10-CM | POA: Insufficient documentation

## 2013-01-13 DIAGNOSIS — R51 Headache: Secondary | ICD-10-CM | POA: Insufficient documentation

## 2013-01-13 DIAGNOSIS — R5381 Other malaise: Secondary | ICD-10-CM | POA: Insufficient documentation

## 2013-01-13 DIAGNOSIS — R000 Tachycardia, unspecified: Secondary | ICD-10-CM

## 2013-01-13 DIAGNOSIS — Z8719 Personal history of other diseases of the digestive system: Secondary | ICD-10-CM | POA: Insufficient documentation

## 2013-01-13 HISTORY — DX: Liver disease, unspecified: K76.9

## 2013-01-13 HISTORY — DX: Gastrointestinal hemorrhage, unspecified: K92.2

## 2013-01-13 LAB — COMPREHENSIVE METABOLIC PANEL
ALT: 19 U/L (ref 0–53)
AST: 45 U/L — ABNORMAL HIGH (ref 0–37)
Alkaline Phosphatase: 162 U/L — ABNORMAL HIGH (ref 39–117)
Calcium: 8.5 mg/dL (ref 8.4–10.5)
GFR calc Af Amer: >90 mL/min (ref >90)
Glucose, Bld: 140 mg/dL — ABNORMAL HIGH (ref 70–99)
Potassium: 3.9 mEq/L (ref 3.7–5.3)
Sodium: 134 mEq/L — ABNORMAL LOW (ref 137–147)
Total Protein: 6.6 g/dL (ref 6.0–8.3)

## 2013-01-13 LAB — CBC WITH DIFFERENTIAL/PLATELET
Basophils Absolute: 0.1 10*3/uL (ref 0.0–0.1)
Eosinophils Absolute: 0.6 10*3/uL (ref 0.0–0.7)
Eosinophils Relative: 8 % — ABNORMAL HIGH (ref 0–5)
Lymphocytes Relative: 22 % (ref 12–46)
Lymphs Abs: 1.6 10*3/uL (ref 0.7–4.0)
MCH: 31.9 pg (ref 26.0–34.0)
Neutrophils Relative %: 59 % (ref 43–77)
Platelets: 72 10*3/uL — ABNORMAL LOW (ref 150–400)
RBC: 3.79 MIL/uL — ABNORMAL LOW (ref 4.22–5.81)
RDW: 17.4 % — ABNORMAL HIGH (ref 11.5–15.5)
WBC: 7.3 10*3/uL (ref 4.0–10.5)

## 2013-01-13 MED ORDER — SODIUM CHLORIDE 0.9 % IV BOLUS (SEPSIS)
500.0000 mL | Freq: Once | INTRAVENOUS | Status: AC
  Administered 2013-01-13: 500 mL via INTRAVENOUS

## 2013-01-13 NOTE — ED Notes (Signed)
Pt states that he started having tachycardia yesterday, shaky, diarrhea. Went to [facility] urgent care with no definitive diagnosis. Feels worse today.

## 2013-01-13 NOTE — ED Provider Notes (Signed)
CSN: [id number]     Arrival date & time 01/13/13  1638 History   First MD Initiated Contact with Patient 01/13/13 1736     Chief Complaint  Patient presents with  . Tachycardia   (Consider location/radiation/quality/duration/timing/severity/associated sxs/prior Treatment) HPI Comments: Patient presents with multiple symptoms. His main complaint is that he feels like his heart rate is too fast and he has a tremor. He's also had some diarrhea and stomach discomfort. He states he started feeling bad on Sunday when he had an onset of a left-sided headache. He states it is pretty severe and was behind his left eye. He also knew blurry vision in his left eye. He states his headache at this point is almost completely resolved in his blurred vision is almost back to normal. 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 He currently denies any pain to his side. He denied any double vision. He denies any facial drooping or facial numbness. There is new speech problems. He denies any dizziness or ataxia. He denies any numbness or weakness in his extremities. He states he did take his Sudafed at that time to try to make the headache better. The day after that he started noticing a tremor in both his arms and his legs. He's also felt weak all over and felt like his heart been higher than normal. He denies any chest pain or shortness of breath. He started having diarrhea 2 days ago. He states it's watery diarrhea at any time he eats anything it goes through him. He denies any blood in the stool. Except for mild nausea but no vomiting. He denies he fevers or chills. He was seen at the [facility] urgent care walk-in [facility] last night but hasn't felt any better since that visit.   Past Medical History  Diagnosis Date  . Stomach ulcer   . GI bleed   . Liver disorder    Past Surgical History  Procedure Laterality Date  . Cataract extraction, bilateral    .  Surgery for lazy eye      age 67  . Esophagogastroduodenoscopy N/A 04/16/2012    Procedure: ESOPHAGOGASTRODUODENOSCOPY (EGD);  Surgeon: Petra Kuba, MD;  Location: [facility];  Service: Endoscopy;  Laterality: N/A;  . Esophagogastroduodenoscopy N/A 04/17/2012    Procedure: ESOPHAGOGASTRODUODENOSCOPY (EGD);  Surgeon: Petra Kuba, MD;  Location: [facility];  Service: Endoscopy;  Laterality: N/A;   No family history on file. History  Substance Use Topics  . Smoking status: Current Every Day Smoker -- 1.00 packs/day    Types: Cigarettes  . Smokeless tobacco: Never Used  . Alcohol Use: Yes    Review of Systems  Constitutional: Positive for fatigue. Negative for fever, chills and diaphoresis.  HENT: Negative for congestion, rhinorrhea and sneezing.   Eyes: Positive for photophobia, pain, redness and visual disturbance.       Eye complaints are almost completely resolved  Respiratory: Negative for cough, chest tightness and shortness of breath.   Cardiovascular: Positive for palpitations. Negative for chest pain and leg swelling.  Gastrointestinal: Positive for nausea and diarrhea. Negative for vomiting, abdominal pain and blood in stool.  Genitourinary: Negative for frequency, hematuria, flank pain and difficulty urinating.  Musculoskeletal: Negative for arthralgias and back pain.  Skin: Negative for rash.  Neurological: Positive for headaches. Negative for dizziness, speech difficulty, weakness and numbness.    Allergies  Penicillins  Home Medications   Current Outpatient Rx  Name  Route  Sig  Dispense  Refill  . acetaminophen (TYLENOL) 325 MG tablet   Oral   Take 650 mg by mouth every 6 (six) hours as needed for mild pain.         Marland Kitchen alendronate (FOSAMAX) 70 MG tablet   Oral   Take 70 mg by mouth once a week. Take with a full glass of water on an empty stomach.         . ferrous sulfate 325 (65 FE) MG tablet   Oral   Take 325 mg by mouth daily with breakfast.          . furosemide (LASIX) 40 MG tablet   Oral   Take 0.5 tablets (20 mg total) by mouth daily.   30 tablet   1   . HYDROmorphone (DILAUDID) 2 MG tablet   Oral   Take 1 tablet (2 mg total) by mouth every 12 (twelve) hours as needed for pain.   60 tablet   0   . hydrOXYzine (ATARAX/VISTARIL) 25 MG tablet   Oral   Take 1 tablet (25 mg total) by mouth every 6 (six) hours as needed for itching.   30 tablet   1   . magnesium oxide (MAG-OX) 400 MG tablet   Oral   Take 400 mg by mouth daily.         . pantoprazole (PROTONIX) 40 MG tablet   Oral   Take 1 tablet (40 mg total) by mouth 2 (two) times daily before a meal.   60 tablet   3   . potassium chloride (K-DUR) 10 MEQ tablet   Oral   Take 2 tablets (20 mEq total) by mouth 2 (two) times daily.   30 tablet   0   . senna (SENOKOT) 8.6 MG TABS   Oral   Take 1 tablet (8.6 mg total) by mouth 2 (two) times daily.   60 each   1   . spironolactone (ALDACTONE) 50 MG tablet   Oral   Take 100 mg by mouth daily.         . ursodiol (URSO) 250 MG tablet   Oral   Take 250 mg by mouth 3 (three) times daily.         . Vitamin D, Ergocalciferol, (DRISDOL) 50000 UNITS CAPS capsule   Oral   Take 50,000 Units by mouth every 7 (seven) days.          BP 115/56  Pulse 113  Temp(Src) 97.8 F (36.6 C) (Oral)  Resp 18  SpO2 94% Physical Exam  Constitutional: He is oriented to person, place, and time. He appears well-developed and well-nourished.  HENT:  Head: Normocephalic and atraumatic.  Mouth/Throat: Oropharynx is clear and moist.  Eyes: Conjunctivae and EOM are normal. Pupils are equal, round, and reactive to light.  Neck: Normal range of motion. Neck supple.  Cardiovascular: Regular rhythm and normal heart sounds.  Tachycardia present.   Pulmonary/Chest: Effort normal and breath sounds normal. No respiratory distress. He has no wheezes. He has no rales. He exhibits no tenderness.  Abdominal: Soft. Bowel sounds are normal.  There is no tenderness. There is no rebound and no guarding.  Musculoskeletal: Normal range of motion. He exhibits no edema.  Lymphadenopathy:    He has no cervical adenopathy.  Neurological: He is alert and oriented to person, place, and time.  Motor 5 out of 5 all extremities. No pronator drift. Sensation grossly intact to light touch all extremities.  He has normal sensation to the face. There is no facial drooping. No aphasia. Extraocular eye movements are intact.  Skin: Skin is warm and dry. No rash noted.  Psychiatric: He has a normal mood and affect.    ED Course  Procedures (including critical care time) Labs Review Results for orders placed during the hospital encounter of 01/13/13  CBC WITH DIFFERENTIAL      Result Value Range   WBC 7.3  4.0 - 10.5 K/uL   RBC 3.79 (*) 4.22 - 5.81 MIL/uL   Hemoglobin 12.1 (*) 13.0 - 17.0 g/dL   HCT 62.9 (*) 52.8 - 41.3 %   MCV 95.3  78.0 - 100.0 fL   MCH 31.9  26.0 - 34.0 pg   MCHC 33.5  30.0 - 36.0 g/dL   RDW 24.4 (*) 01.0 - 27.2 %   Platelets 72 (*) 150 - 400 K/uL   Neutrophils Relative % 59  43 - 77 %   Neutro Abs 4.3  1.7 - 7.7 K/uL   Lymphocytes Relative 22  12 - 46 %   Lymphs Abs 1.6  0.7 - 4.0 K/uL   Monocytes Relative 10  3 - 12 %   Monocytes Absolute 0.7  0.1 - 1.0 K/uL   Eosinophils Relative 8 (*) 0 - 5 %   Eosinophils Absolute 0.6  0.0 - 0.7 K/uL   Basophils Relative 2 (*) 0 - 1 %   Basophils Absolute 0.1  0.0 - 0.1 K/uL  COMPREHENSIVE METABOLIC PANEL      Result Value Range   Sodium 134 (*) 137 - 147 mEq/L   Potassium 3.9  3.7 - 5.3 mEq/L   Chloride 99  96 - 112 mEq/L   CO2 23  19 - 32 mEq/L   Glucose, Bld 140 (*) 70 - 99 mg/dL   BUN 4 (*) 6 - 23 mg/dL   Creatinine, Ser 5.36  0.50 - 1.35 mg/dL   Calcium 8.5  8.4 - 64.4 mg/dL   Total Protein 6.6  6.0 - 8.3 g/dL   Albumin 2.7 (*) 3.5 - 5.2 g/dL   AST 45 (*) 0 - 37 U/L   ALT 19  0 - 53 U/L   Alkaline Phosphatase 162 (*) 39 - 117 U/L   Total Bilirubin 1.9 (*) 0.3 -  1.2 mg/dL   GFR calc non Af Amer 88 (*) >90 mL/min   GFR calc Af Amer >90  >90 mL/min  POCT I-STAT TROPONIN I      Result Value Range   Troponin i, poc 0.01  0.00 - 0.08 ng/mL   Comment 3            No results found.   Imaging Review Ct Head Wo Contrast  01/13/2013   CLINICAL DATA:  Headache.  Blurred vision.  EXAM: CT HEAD WITHOUT CONTRAST  TECHNIQUE: Contiguous axial images were obtained from the base of the skull through the vertex without intravenous contrast.  COMPARISON:  None.  FINDINGS: Faint white matter hypodensity in the parietal periventricular white matter and corona radiata. Otherwise, the brainstem, cerebellum, cerebral peduncles, thalamus, basal ganglia, basilar cisterns, and ventricular system appear within normal limits.  No intracranial hemorrhage, mass lesion, or acute CVA. Mild chronic right ethmoid sinusitis.  IMPRESSION: 1. Faint bilaterally symmetric parietal white matter hypodensities favor chronic ischemic microvascular white matter disease. Posterior reversible encephalopathy syndrome (PRES) is a differential diagnostic consideration -correlate with any hypertension. 2. Mild chronic right ethmoid sinusitis.   Electronically Signed  By: Herbie Baltimore M.D.   On: 01/13/2013 19:10    EKG Interpretation    Date/Time:  Wednesday January 13 2013 16:48:11 EST Ventricular Rate:  115 PR Interval:  157 QRS Duration: 90 QT Interval:  376 QTC Calculation: 520 R Axis:   -88 Text Interpretation:  Sinus tachycardia with irregular rate Inferior infarct, old Anteroseptal infarct, age indeterminate Prolonged QT interval PACs present No old tracing to compare Confirmed by Felita Bump  MD, Tab Rylee ([id number]) on 01/13/2013 6:08:13 PM            MDM   1. Tachycardia   2. Diarrhea    Patient presents with multiple symptoms.  Mostly, he is concerned about the tachycardia and tremor. I'm not sure the etiology of this. He was feeling much better after some IV fluids. He was still  mildly tachycardic with a heart rate around 110 to 115. He has no other symptoms suggestive of pulmonary embolus. He has no significant anemia. He has no suggestions of infection other than the diarrhea. His abdomen is nontender on exam he is afebrile. He has no other neurologic findings or deficits. He was discharged home. He is advised to continue clear liquids and follow up with his primary care physician on Friday. Advised to return here for symptoms worsen over the holiday.    Rolan Bucco, MD 01/13/13 [phone]

## 2013-06-09 ENCOUNTER — Emergency Department (HOSPITAL_COMMUNITY)
Admission: EM | Admit: 2013-06-09 | Discharge: 2013-06-10 | Disposition: A | Discharge status: Home / Self Care | Payer: 59 | Attending: Emergency Medicine | Admitting: Emergency Medicine

## 2013-06-09 ENCOUNTER — Encounter (HOSPITAL_COMMUNITY): Payer: Self-pay | Admitting: Emergency Medicine

## 2013-06-09 DIAGNOSIS — R609 Edema, unspecified: Secondary | ICD-10-CM | POA: Insufficient documentation

## 2013-06-09 DIAGNOSIS — F17200 Nicotine dependence, unspecified, uncomplicated: Secondary | ICD-10-CM | POA: Insufficient documentation

## 2013-06-09 DIAGNOSIS — R195 Other fecal abnormalities: Secondary | ICD-10-CM

## 2013-06-09 DIAGNOSIS — Z79899 Other long term (current) drug therapy: Secondary | ICD-10-CM | POA: Insufficient documentation

## 2013-06-09 DIAGNOSIS — Z880 Allergy status to penicillin: Secondary | ICD-10-CM | POA: Insufficient documentation

## 2013-06-09 DIAGNOSIS — K922 Gastrointestinal hemorrhage, unspecified: Secondary | ICD-10-CM | POA: Insufficient documentation

## 2013-06-09 HISTORY — DX: Unspecified cirrhosis of liver: K74.60

## 2013-06-09 LAB — CBC WITH DIFFERENTIAL/PLATELET
BASOS ABS: 0.1 10*3/uL (ref 0.0–0.1)
Basophils Relative: 2 % — ABNORMAL HIGH (ref 0–1)
EOS ABS: 0.8 10*3/uL — AB (ref 0.0–0.7)
Eosinophils Relative: 15 % — ABNORMAL HIGH (ref 0–5)
HCT: 33.1 % — ABNORMAL LOW (ref 39.0–52.0)
Hemoglobin: 11.2 g/dL — ABNORMAL LOW (ref 13.0–17.0)
LYMPHS PCT: 22 % (ref 12–46)
Lymphs Abs: 1.2 10*3/uL (ref 0.7–4.0)
MCH: 31.8 pg (ref 26.0–34.0)
MCHC: 33.8 g/dL (ref 30.0–36.0)
MCV: 94 fL (ref 78.0–100.0)
Monocytes Absolute: 0.7 10*3/uL (ref 0.1–1.0)
Monocytes Relative: 13 % — ABNORMAL HIGH (ref 3–12)
NEUTROS PCT: 48 % (ref 43–77)
Neutro Abs: 2.6 10*3/uL (ref 1.7–7.7)
Platelets: 71 10*3/uL — ABNORMAL LOW (ref 150–400)
RBC: 3.52 MIL/uL — ABNORMAL LOW (ref 4.22–5.81)
RDW: 16.8 % — AB (ref 11.5–15.5)
WBC: 5.4 10*3/uL (ref 4.0–10.5)

## 2013-06-09 LAB — POC OCCULT BLOOD, ED: FECAL OCCULT BLD: POSITIVE — AB

## 2013-06-09 NOTE — ED Provider Notes (Signed)
CSN: [id number]     Arrival date & time 06/09/13  1957 History   First MD Initiated Contact with Patient 06/09/13 2222     Chief Complaint  Patient presents with  . Abnormal Lab     (Consider location/radiation/quality/duration/timing/severity/associated sxs/prior Treatment) HPI  This is a 68 year old male with history of cirrhosis of the liver who presents with abnormal labs. Patient states that he was seen for a regular checkup by his primary care physician. He had lab work drawn yesterday and was noted to have a drop in his hemoglobin. Labs were rechecked today and a further drop was noted. He denies any symptoms including abdominal pain, vomiting, hematemesis, bloody stools or melena. He states "I feel great." He was referred to the emergency room for further evaluation.  I have reviewed patient's lab work. Patient's hemoglobin dropped from 11.3-10.6 for one day and baseline is around 12. Patient also noted to have platelets of 60.  Past Medical History  Diagnosis Date  . Stomach ulcer   . GI bleed   . Liver disorder   . Cirrhosis    Past Surgical History  Procedure Laterality Date  . Cataract extraction, bilateral    . Surgery for lazy eye      age 31  . Esophagogastroduodenoscopy N/A 04/16/2012    Procedure: ESOPHAGOGASTRODUODENOSCOPY (EGD);  Surgeon: Jeryl Columbia, MD;  Location: [facility];  Service: Endoscopy;  Laterality: N/A;  . Esophagogastroduodenoscopy N/A 04/17/2012    Procedure: ESOPHAGOGASTRODUODENOSCOPY (EGD);  Surgeon: Jeryl Columbia, MD;  Location: [facility];  Service: Endoscopy;  Laterality: N/A;   History reviewed. No pertinent family history. History  Substance Use Topics  . Smoking status: Current Every Day Smoker -- 1.00 packs/day    Types: Cigarettes  . Smokeless tobacco: Never Used  . Alcohol Use: No    Review of Systems  Constitutional: Negative.  Negative for fever.  Respiratory: Negative.  Negative for chest tightness and shortness of breath.    Cardiovascular: Negative.  Negative for chest pain.  Gastrointestinal: Negative.  Negative for nausea, vomiting, abdominal pain, diarrhea, blood in stool and anal bleeding.  Genitourinary: Negative.  Negative for dysuria.  Musculoskeletal: Negative for back pain.  Neurological: Negative for headaches.  All other systems reviewed and are negative.     Allergies  Penicillins  Home Medications   Prior to Admission medications   Medication Sig Start Date End Date Taking? Authorizing Provider  alendronate (FOSAMAX) 70 MG tablet Take 70 mg by mouth once a week. Take with a full glass of water on an empty stomach.   Yes Historical Provider, MD  diphenhydrAMINE (BENADRYL) 25 mg capsule Take 25 mg by mouth at bedtime as needed for itching or sleep.   Yes Historical Provider, MD  ferrous sulfate 325 (65 FE) MG tablet Take 325 mg by mouth daily with breakfast.   Yes Historical Provider, MD  furosemide (LASIX) 40 MG tablet Take 40 mg by mouth daily.   Yes Historical Provider, MD  HYDROmorphone (DILAUDID) 2 MG tablet Take 1 tablet (2 mg total) by mouth every 12 (twelve) hours as needed for pain. 04/21/12  Yes Theodis Blaze, MD  Magnesium 250 MG TABS Take 250 mg by mouth daily.   Yes Historical Provider, MD  pantoprazole (PROTONIX) 40 MG tablet Take 1 tablet (40 mg total) by mouth 2 (two) times daily before a meal. 04/21/12  Yes Theodis Blaze, MD  potassium chloride (K-DUR) 10 MEQ tablet Take 2 tablets (20 mEq total) by  mouth 2 (two) times daily. 04/21/12  Yes Theodis Blaze, MD  spironolactone (ALDACTONE) 50 MG tablet Take 100 mg by mouth daily.   Yes Historical Provider, MD  traMADol (ULTRAM) 50 MG tablet Take 50 mg by mouth every 6 (six) hours as needed for moderate pain.   Yes Historical Provider, MD  ursodiol (URSO) 250 MG tablet Take 250 mg by mouth 2 (two) times daily.    Yes Historical Provider, MD  Vitamin D, Ergocalciferol, (DRISDOL) 50000 UNITS CAPS capsule Take 50,000 Units by mouth every 7  (seven) days.   Yes Historical Provider, MD   BP 101/58  Pulse 88  Temp(Src) 98.1 F (36.7 C) (Oral)  Resp 18  SpO2 94% Physical Exam  Nursing note and vitals reviewed. Constitutional: He is oriented to person, place, and time. No distress.  Elderly  HENT:  Head: Normocephalic and atraumatic.  Eyes: Pupils are equal, round, and reactive to light.  Neck: Neck supple.  Cardiovascular: Normal rate, regular rhythm and normal heart sounds.   No murmur heard. Pulmonary/Chest: Effort normal and breath sounds normal. No respiratory distress. He has no wheezes.  Abdominal: Soft. Bowel sounds are normal. He exhibits no distension. There is no tenderness. There is no rebound.  Musculoskeletal: He exhibits edema.  2+ bilateral lower extremity edema  Neurological: He is alert and oriented to person, place, and time.  Skin: Skin is warm and dry.  Psychiatric: He has a normal mood and affect.    ED Course  Procedures (including critical care time) Labs Review Labs Reviewed  CBC WITH DIFFERENTIAL - Abnormal; Notable for the following:    RBC 3.52 (*)    Hemoglobin 11.2 (*)    HCT 33.1 (*)    RDW 16.8 (*)    Platelets 71 (*)    Monocytes Relative 13 (*)    Eosinophils Relative 15 (*)    Basophils Relative 2 (*)    Eosinophils Absolute 0.8 (*)    All other components within normal limits  POC OCCULT BLOOD, ED - Abnormal; Notable for the following:    Fecal Occult Bld POSITIVE (*)    All other components within normal limits    Imaging Review No results found.   EKG Interpretation None      MDM   Final diagnoses:  Occult GI bleeding    Patient presents with abnormal labs from his doctor's office. He is nontoxic on exam. Patient denies any symptoms at this time. He does have a history of GI bleed. No evidence of orthostasis. CBC repeated here and shows a hemoglobin of 11.2 and platelets of 71. Platelets her baseline. Last hemoglobin on December 31 was 12.1. Hemoccult is  positive. Given that patient is hemodynamically stable and no evidence of acute bleeding. Feel patient can followup with his GI physician for further evaluation and possible endoscopy. Patient is already on a PPI.  Patient was given her strict return precautions including abdominal pain, hematemesis, melena, bright red blood per rectum, or syncope.  Will call Dr. Watt Climes appointment as soon as possible.  After history, exam, and medical workup I feel the patient has been appropriately medically screened and is safe for discharge home. Pertinent diagnoses were discussed with the patient. Patient was given return precautions.     Merryl Hacker, MD 06/10/13 0030

## 2013-06-09 NOTE — ED Notes (Signed)
Pt in with family that reports that pt had lab drawn by dr office and was told to come to ED d/t hgb dropping to 10.6 and platelets low as 60. Pt has hx of cirrhosis. Pt alert and ambulatory to triage area. Pt denies any pain.

## 2013-06-10 MED ORDER — OMEPRAZOLE 20 MG PO CPDR: 20.0000 mg | DELAYED_RELEASE_CAPSULE | Freq: Every day | ORAL | Status: DC

## 2013-06-10 NOTE — Discharge Instructions (Signed)
Gastrointestinal Bleeding Gastrointestinal bleeding is bleeding somewhere along the path that food travels through the body (digestive tract). This path is anywhere between the mouth and the opening of the butt (anus). You may have blood in your throw up (vomit) or in your poop (stools). If there is a lot of bleeding, you may need to stay in the hospital.  You need follow-up with Dr. Watt Climes for further evaluation and possible EGD. HOME CARE  Only take medicine as told by your doctor.  Eat foods with fiber such as whole grains, fruits, and vegetables. You can also try eating 1 to 3 prunes a day.  Drink enough fluids to keep your pee (urine) clear or pale yellow. GET HELP RIGHT AWAY IF:   Your bleeding gets worse.  You feel dizzy, weak, or you pass out (faint).  You have bad cramps in your back or belly (abdomen).  You have large blood clumps (clots) in your poop.  Your problems are getting worse. MAKE SURE YOU:   Understand these instructions.  Will watch your condition.  Will get help right away if you are not doing well or get worse. Document Released: 10/10/2007 Document Revised: 12/18/2011 Document Reviewed: 12/10/2010 [facility] Patient Information 2014 [place], [place].

## 2013-07-02 ENCOUNTER — Emergency Department (HOSPITAL_COMMUNITY)
Admission: EM | Admit: 2013-07-02 | Discharge: 2013-07-02 | Disposition: A | Discharge status: Home / Self Care | Payer: 59 | Attending: Emergency Medicine | Admitting: Emergency Medicine

## 2013-07-02 ENCOUNTER — Encounter (HOSPITAL_COMMUNITY): Payer: Self-pay | Admitting: Emergency Medicine

## 2013-07-02 DIAGNOSIS — K7460 Unspecified cirrhosis of liver: Secondary | ICD-10-CM | POA: Insufficient documentation

## 2013-07-02 DIAGNOSIS — R110 Nausea: Secondary | ICD-10-CM | POA: Insufficient documentation

## 2013-07-02 DIAGNOSIS — F17200 Nicotine dependence, unspecified, uncomplicated: Secondary | ICD-10-CM | POA: Insufficient documentation

## 2013-07-02 DIAGNOSIS — E7220 Disorder of urea cycle metabolism, unspecified: Secondary | ICD-10-CM | POA: Insufficient documentation

## 2013-07-02 DIAGNOSIS — R7989 Other specified abnormal findings of blood chemistry: Secondary | ICD-10-CM

## 2013-07-02 DIAGNOSIS — Z79899 Other long term (current) drug therapy: Secondary | ICD-10-CM | POA: Insufficient documentation

## 2013-07-02 DIAGNOSIS — Z880 Allergy status to penicillin: Secondary | ICD-10-CM | POA: Insufficient documentation

## 2013-07-02 LAB — COMPREHENSIVE METABOLIC PANEL
ALT: 28 U/L (ref 0–53)
AST: 39 U/L — ABNORMAL HIGH (ref 0–37)
Albumin: 2.7 g/dL — ABNORMAL LOW (ref 3.5–5.2)
Alkaline Phosphatase: 118 U/L — ABNORMAL HIGH (ref 39–117)
BILIRUBIN TOTAL: 1.6 mg/dL — AB (ref 0.3–1.2)
BUN: 12 mg/dL (ref 6–23)
CHLORIDE: 101 meq/L (ref 96–112)
CO2: 22 meq/L (ref 19–32)
CREATININE: 0.87 mg/dL (ref 0.50–1.35)
Calcium: 8.6 mg/dL (ref 8.4–10.5)
GFR calc Af Amer: >90 mL/min (ref >90)
GFR, EST NON AFRICAN AMERICAN: 86 mL/min — AB (ref >90)
Glucose, Bld: 96 mg/dL (ref 70–99)
Potassium: 3.6 mEq/L — ABNORMAL LOW (ref 3.7–5.3)
Sodium: 137 mEq/L (ref 137–147)
Total Protein: 6.8 g/dL (ref 6.0–8.3)

## 2013-07-02 LAB — CBC WITH DIFFERENTIAL/PLATELET
Basophils Absolute: 0.1 10*3/uL (ref 0.0–0.1)
Basophils Relative: 1 % (ref 0–1)
Eosinophils Absolute: 0.4 10*3/uL (ref 0.0–0.7)
Eosinophils Relative: 9 % — ABNORMAL HIGH (ref 0–5)
HEMATOCRIT: 34.2 % — AB (ref 39.0–52.0)
HEMOGLOBIN: 11.8 g/dL — AB (ref 13.0–17.0)
LYMPHS ABS: 0.7 10*3/uL (ref 0.7–4.0)
LYMPHS PCT: 17 % (ref 12–46)
MCH: 31.8 pg (ref 26.0–34.0)
MCHC: 34.5 g/dL (ref 30.0–36.0)
MCV: 92.2 fL (ref 78.0–100.0)
MONO ABS: 0.5 10*3/uL (ref 0.1–1.0)
Monocytes Relative: 11 % (ref 3–12)
Neutro Abs: 2.6 10*3/uL (ref 1.7–7.7)
Neutrophils Relative %: 62 % (ref 43–77)
Platelets: 66 10*3/uL — ABNORMAL LOW (ref 150–400)
RBC: 3.71 MIL/uL — ABNORMAL LOW (ref 4.22–5.81)
RDW: 16.5 % — ABNORMAL HIGH (ref 11.5–15.5)
WBC: 4.2 10*3/uL (ref 4.0–10.5)

## 2013-07-02 LAB — AMMONIA: Ammonia: 115 umol/L — ABNORMAL HIGH (ref 11–60)

## 2013-07-02 LAB — LIPASE, BLOOD: LIPASE: 95 U/L — AB (ref 11–59)

## 2013-07-02 MED ORDER — ONDANSETRON HCL 4 MG/2ML IJ SOLN
4.0000 mg | Freq: Once | INTRAMUSCULAR | Status: AC
  Administered 2013-07-02: 4 mg via INTRAVENOUS
  Filled 2013-07-02: qty 2

## 2013-07-02 MED ORDER — ONDANSETRON HCL 4 MG PO TABS: 4.0000 mg | ORAL_TABLET | Freq: Four times a day (QID) | ORAL | Status: DC

## 2013-07-02 NOTE — ED Provider Notes (Signed)
Medical screening examination/treatment/procedure(s) were conducted as a shared visit with non-physician practitioner(s) and myself.  I personally evaluated the patient during the encounter.   EKG Interpretation None      Patient here with nausea. Hx of chronic liver disease. Mentating well, was driving earlier today. Sent to ED by request of Dr. Watt Climes, with [facility]. Found to be hyperammonemic - hx of same. Asterixis present. With history of same - Dr. Watt Climes recommended holding off on lactulose. Stable for discharge.  Osvaldo Shipper, MD 07/02/13 2312

## 2013-07-02 NOTE — ED Notes (Addendum)
Pt ambulatory to restroom. Pt ambulated well with steady gait and in NAD

## 2013-07-02 NOTE — ED Provider Notes (Signed)
CSN: [id number]     Arrival date & time 07/02/13  1743 History   First MD Initiated Contact with Patient 07/02/13 1802     Chief Complaint  Patient presents with  . Abdominal Pain  . Weakness     (Consider location/radiation/quality/duration/timing/severity/associated sxs/prior Treatment) HPI Comments: Patient with a history of Liver Cirrhosis presents today with a chief complaint of nausea.  He reports that he has been feeling nauseous for the past 4 hours.  He denies vomiting or diarrhea.  Denies abdominal pain.  Denies fever, chills, or urinary symptoms.  Denies abdominal distension.  Denies blood in his stool.  He has not taken anything for nausea prior to arrival.  He reports that he called his GI physician Dr. Watt Climes and was told to come to the ED to have his Ammonia checked.  He denies any confusion.  Patient's wife and daughter also with him in the ED today and deny that the patient has been confused.  Patient is currently not on Lactulose.    The history is provided by the patient.    Past Medical History  Diagnosis Date  . Stomach ulcer   . GI bleed   . Liver disorder   . Cirrhosis    Past Surgical History  Procedure Laterality Date  . Cataract extraction, bilateral    . Surgery for lazy eye      age 27  . Esophagogastroduodenoscopy N/A 04/16/2012    Procedure: ESOPHAGOGASTRODUODENOSCOPY (EGD);  Surgeon: Jeryl Columbia, MD;  Location: [facility];  Service: Endoscopy;  Laterality: N/A;  . Esophagogastroduodenoscopy N/A 04/17/2012    Procedure: ESOPHAGOGASTRODUODENOSCOPY (EGD);  Surgeon: Jeryl Columbia, MD;  Location: [facility];  Service: Endoscopy;  Laterality: N/A;   No family history on file. History  Substance Use Topics  . Smoking status: Current Every Day Smoker -- 1.00 packs/day    Types: Cigarettes  . Smokeless tobacco: Never Used  . Alcohol Use: No    Review of Systems  All other systems reviewed and are negative.     Allergies  Penicillins  Home  Medications   Prior to Admission medications   Medication Sig Start Date End Date Taking? Authorizing Provider  alendronate (FOSAMAX) 70 MG tablet Take 70 mg by mouth once a week. Take with a full glass of water on an empty stomach.    Historical Provider, MD  diphenhydrAMINE (BENADRYL) 25 mg capsule Take 25 mg by mouth at bedtime as needed for itching or sleep.    Historical Provider, MD  ferrous sulfate 325 (65 FE) MG tablet Take 325 mg by mouth daily with breakfast.    Historical Provider, MD  furosemide (LASIX) 40 MG tablet Take 40 mg by mouth daily.    Historical Provider, MD  HYDROmorphone (DILAUDID) 2 MG tablet Take 1 tablet (2 mg total) by mouth every 12 (twelve) hours as needed for pain. 04/21/12   Theodis Blaze, MD  Magnesium 250 MG TABS Take 250 mg by mouth daily.    Historical Provider, MD  omeprazole (PRILOSEC) 20 MG capsule Take 1 capsule (20 mg total) by mouth daily. 06/10/13   Merryl Hacker, MD  pantoprazole (PROTONIX) 40 MG tablet Take 1 tablet (40 mg total) by mouth 2 (two) times daily before a meal. 04/21/12   Theodis Blaze, MD  potassium chloride (K-DUR) 10 MEQ tablet Take 2 tablets (20 mEq total) by mouth 2 (two) times daily. 04/21/12   Theodis Blaze, MD  spironolactone (ALDACTONE) 50 MG tablet  Take 100 mg by mouth daily.    Historical Provider, MD  traMADol (ULTRAM) 50 MG tablet Take 50 mg by mouth every 6 (six) hours as needed for moderate pain.    Historical Provider, MD  ursodiol (URSO) 250 MG tablet Take 250 mg by mouth 2 (two) times daily.     Historical Provider, MD  Vitamin D, Ergocalciferol, (DRISDOL) 50000 UNITS CAPS capsule Take 50,000 Units by mouth every 7 (seven) days.    Historical Provider, MD   BP 108/53  Pulse 80  Temp(Src) 97.8 F (36.6 C) (Oral)  Resp 14  SpO2 100% Physical Exam  Nursing note and vitals reviewed. Constitutional: He appears well-developed and well-nourished.  HENT:  Head: Normocephalic.  Mouth/Throat: Oropharynx is clear and moist.   Eyes: EOM are normal.  Neck: Normal range of motion. Neck supple.  Cardiovascular: Normal rate, regular rhythm and normal heart sounds.   Pulmonary/Chest: Effort normal and breath sounds normal.  Abdominal: Soft. Bowel sounds are normal. He exhibits no distension and no mass. There is no tenderness. There is no rebound and no guarding.  Musculoskeletal: Normal range of motion.  Neurological: He is alert.  Asterixis present  Skin: Skin is warm and dry.  Psychiatric: He has a normal mood and affect.    ED Course  Procedures (including critical care time) Labs Review Labs Reviewed  CBC WITH DIFFERENTIAL - Abnormal; Notable for the following:    RBC 3.71 (*)    Hemoglobin 11.8 (*)    HCT 34.2 (*)    RDW 16.5 (*)    Platelets 66 (*)    Eosinophils Relative 9 (*)    All other components within normal limits  COMPREHENSIVE METABOLIC PANEL - Abnormal; Notable for the following:    Potassium 3.6 (*)    Albumin 2.7 (*)    AST 39 (*)    Alkaline Phosphatase 118 (*)    Total Bilirubin 1.6 (*)    GFR calc non Af Amer 86 (*)    All other components within normal limits  LIPASE, BLOOD - Abnormal; Notable for the following:    Lipase 95 (*)    All other components within normal limits  AMMONIA - Abnormal; Notable for the following:    Ammonia 115 (*)    All other components within normal limits    Imaging Review No results found.   EKG Interpretation None     Discussed with Dr. Amedeo Plenty with GI.  He was made aware of the patient's elevated Ammonia level of 115.  He looked through the patient's records and noted an elevated Ammonia level.  He states that he does not recommend Lactulose at this time since the patient had not been on this in the past.  He feels that the patient does not require admission especially since he is mentating appropriately.  He recommends follow up with Dr. Claudina Lick in 2 weeks.   MDM   Final diagnoses:  None   Patient with a history of Liver Cirrhosis  presents today with a chief complaint of nausea.  No vomiting.  No abdominal pain.  Abdomen soft and nontender on exam.  He denies any blood in his stool.  Labs show a stable hemoglobin.  No leukocytosis.  Lipase mildly elevated at 95, but patient without abdominal pain or vomiting.  Patient found to have an elevated Ammonia level or 115.  Patient is mentating appropriately.  Both patient and family deny noticing any confusion.  Discussed elevated Ammonia with Dr. Amedeo Plenty with  GI.  He recommends having the patient follow up with GI outpatient and feels that since the patient's mental status is not altered he does not require admission.  Patient and family would like for the patient to be discharged.  Patient non toxic appearing.  Nausea improved during ED course.  Patient tolerating PO liquids.  Feel that the patient is stable for discharge.  Return precautions given.    Hyman Bible, PA-C 07/02/13 2300

## 2013-07-02 NOTE — ED Notes (Signed)
Pt c/o upset stomach and weakness x 4 hours, pt has liver dz.

## 2013-07-07 ENCOUNTER — Encounter (HOSPITAL_BASED_OUTPATIENT_CLINIC_OR_DEPARTMENT_OTHER): Payer: Self-pay | Admitting: *Deleted

## 2013-07-07 NOTE — Progress Notes (Signed)
Labs and ekg done-cirrhosis liver-ed 07/02/13-amonia high-fluids and tx-better-

## 2013-07-12 NOTE — H&P (Signed)
PREOPERATIVE H&P  Chief Complaint: abnormality in hypopaharynx  HPI: Clifford Cook is a 68 y.o. male who presents for evaluation of abnormal lesion noted on recent upper endoscopy with Dr Watt Climes. On FOL in the office patient has a raised lesion on the posterior hypopharyngeal wall at the upper limit of the arytenoid. He's taken to the OR for DL and Bx.  Past Medical History  Diagnosis Date  . Stomach ulcer   . GI bleed   . Liver disorder   . Cirrhosis   . GERD (gastroesophageal reflux disease)    Past Surgical History  Procedure Laterality Date  . Cataract extraction, bilateral    . Surgery for lazy eye      age 73  . Esophagogastroduodenoscopy N/A 04/16/2012    Procedure: ESOPHAGOGASTRODUODENOSCOPY (EGD);  Surgeon: Jeryl Columbia, MD;  Location: [facility];  Service: Endoscopy;  Laterality: N/A;  . Esophagogastroduodenoscopy N/A 04/17/2012    Procedure: ESOPHAGOGASTRODUODENOSCOPY (EGD);  Surgeon: Jeryl Columbia, MD;  Location: [facility];  Service: Endoscopy;  Laterality: N/A;   History   Social History  . Marital Status: Married    Spouse Name: N/A    Number of Children: N/A  . Years of Education: N/A   Social History Main Topics  . Smoking status: Current Every Day Smoker -- 1.00 packs/day    Types: Cigarettes  . Smokeless tobacco: Never Used  . Alcohol Use: No     Comment: over i yr  . Drug Use: No  . Sexual Activity: No   Other Topics Concern  . None   Social History Narrative   Patient is married. He is independent.   History reviewed. No pertinent family history. Allergies  Allergen Reactions  . Penicillins Other (See Comments)    Rash hives, white spots.   Prior to Admission medications   Medication Sig Start Date End Date Taking? Authorizing Provider  alendronate (FOSAMAX) 70 MG tablet Take 70 mg by mouth once a week. Take with a full glass of water on an empty stomach.    Historical Provider, MD  diphenhydrAMINE (BENADRYL) 25 mg capsule Take 25 mg by mouth at  bedtime as needed for itching or sleep.    Historical Provider, MD  ferrous sulfate 325 (65 FE) MG tablet Take 325 mg by mouth daily with breakfast.    Historical Provider, MD  furosemide (LASIX) 40 MG tablet Take 40 mg by mouth daily.    Historical Provider, MD  HYDROmorphone (DILAUDID) 2 MG tablet Take 1 tablet (2 mg total) by mouth every 12 (twelve) hours as needed for pain. 04/21/12   Theodis Blaze, MD  Magnesium 250 MG TABS Take 250 mg by mouth daily.    Historical Provider, MD  omeprazole (PRILOSEC) 20 MG capsule Take 1 capsule (20 mg total) by mouth daily. 06/10/13   Merryl Hacker, MD  ondansetron (ZOFRAN) 4 MG tablet Take 1 tablet (4 mg total) by mouth every 6 (six) hours. 07/02/13   Heather Laisure, PA-C  pantoprazole (PROTONIX) 40 MG tablet Take 1 tablet (40 mg total) by mouth 2 (two) times daily before a meal. 04/21/12   Theodis Blaze, MD  potassium chloride (K-DUR) 10 MEQ tablet Take 2 tablets (20 mEq total) by mouth 2 (two) times daily. 04/21/12   Theodis Blaze, MD  spironolactone (ALDACTONE) 50 MG tablet Take 100 mg by mouth daily.    Historical Provider, MD  traMADol (ULTRAM) 50 MG tablet Take 50 mg by mouth every 6 (six) hours  as needed for moderate pain.    Historical Provider, MD  ursodiol (URSO) 250 MG tablet Take 250 mg by mouth 2 (two) times daily.     Historical Provider, MD  Vitamin D, Ergocalciferol, (DRISDOL) 50000 UNITS CAPS capsule Take 50,000 Units by mouth every 7 (seven) days.    Historical Provider, MD     Positive ROS: negative  All other systems have been reviewed and were otherwise negative with the exception of those mentioned in the HPI and as above.  Physical Exam: There were no vitals filed for this visit.  General: Alert, no acute distress Oral: Normal oral mucosa and tonsils Nasal: Clear nasal passages. FOL reveals a 1 cm raised lesion off the posterior pharyngeal wall just above the arytenoid Neck: No palpable adenopathy or thyroid nodules Ear: Ear  canal is clear with normal appearing TMs Cardiovascular: Regular rate and rhythm, no murmur.  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

## 2013-07-13 ENCOUNTER — Ambulatory Visit (HOSPITAL_BASED_OUTPATIENT_CLINIC_OR_DEPARTMENT_OTHER)
Admission: RE | Admit: 2013-07-13 | Discharge: 2013-07-13 | Disposition: A | Discharge status: Home / Self Care | Payer: 59 | Source: Ambulatory Visit | Attending: Otolaryngology | Admitting: Otolaryngology

## 2013-07-13 ENCOUNTER — Encounter (HOSPITAL_BASED_OUTPATIENT_CLINIC_OR_DEPARTMENT_OTHER): Payer: Self-pay

## 2013-07-13 ENCOUNTER — Ambulatory Visit (HOSPITAL_BASED_OUTPATIENT_CLINIC_OR_DEPARTMENT_OTHER): Payer: 59 | Admitting: Anesthesiology

## 2013-07-13 ENCOUNTER — Encounter (HOSPITAL_BASED_OUTPATIENT_CLINIC_OR_DEPARTMENT_OTHER): Payer: 59 | Admitting: Anesthesiology

## 2013-07-13 ENCOUNTER — Encounter (HOSPITAL_BASED_OUTPATIENT_CLINIC_OR_DEPARTMENT_OTHER)
Admission: RE | Disposition: A | Discharge status: Home / Self Care | Payer: Self-pay | Source: Ambulatory Visit | Attending: Otolaryngology

## 2013-07-13 DIAGNOSIS — D3709 Neoplasm of uncertain behavior of other specified sites of the oral cavity: Principal | ICD-10-CM | POA: Insufficient documentation

## 2013-07-13 DIAGNOSIS — F17200 Nicotine dependence, unspecified, uncomplicated: Secondary | ICD-10-CM | POA: Insufficient documentation

## 2013-07-13 DIAGNOSIS — D3701 Neoplasm of uncertain behavior of lip: Secondary | ICD-10-CM | POA: Insufficient documentation

## 2013-07-13 DIAGNOSIS — K7460 Unspecified cirrhosis of liver: Secondary | ICD-10-CM | POA: Insufficient documentation

## 2013-07-13 DIAGNOSIS — Z7983 Long term (current) use of bisphosphonates: Secondary | ICD-10-CM | POA: Insufficient documentation

## 2013-07-13 DIAGNOSIS — D3705 Neoplasm of uncertain behavior of pharynx: Principal | ICD-10-CM

## 2013-07-13 DIAGNOSIS — Z880 Allergy status to penicillin: Secondary | ICD-10-CM | POA: Insufficient documentation

## 2013-07-13 DIAGNOSIS — Z79899 Other long term (current) drug therapy: Secondary | ICD-10-CM | POA: Insufficient documentation

## 2013-07-13 DIAGNOSIS — K219 Gastro-esophageal reflux disease without esophagitis: Secondary | ICD-10-CM | POA: Insufficient documentation

## 2013-07-13 HISTORY — PX: DIRECT LARYNGOSCOPY: SHX5326

## 2013-07-13 SURGERY — LARYNGOSCOPY, DIRECT
Anesthesia: General | Site: Throat

## 2013-07-13 MED ORDER — PHENYLEPHRINE HCL 10 MG/ML IJ SOLN
INTRAMUSCULAR | Status: DC | PRN
  Administered 2013-07-13: 80 ug via INTRAVENOUS
  Administered 2013-07-13 (×2): 40 ug via INTRAVENOUS

## 2013-07-13 MED ORDER — ALBUTEROL SULFATE (2.5 MG/3ML) 0.083% IN NEBU
INHALATION_SOLUTION | RESPIRATORY_TRACT | Status: AC
  Filled 2013-07-13: qty 3

## 2013-07-13 MED ORDER — MIDAZOLAM HCL 5 MG/5ML IJ SOLN
INTRAMUSCULAR | Status: DC | PRN
  Administered 2013-07-13: 1 mg via INTRAVENOUS

## 2013-07-13 MED ORDER — SUCCINYLCHOLINE CHLORIDE 20 MG/ML IJ SOLN
INTRAMUSCULAR | Status: DC | PRN
  Administered 2013-07-13: 50 mg via INTRAVENOUS

## 2013-07-13 MED ORDER — PROPOFOL 10 MG/ML IV BOLUS
INTRAVENOUS | Status: AC
  Filled 2013-07-13: qty 120

## 2013-07-13 MED ORDER — PROPOFOL 10 MG/ML IV BOLUS
INTRAVENOUS | Status: DC | PRN
  Administered 2013-07-13: 150 mg via INTRAVENOUS

## 2013-07-13 MED ORDER — FENTANYL CITRATE 0.05 MG/ML IJ SOLN
INTRAMUSCULAR | Status: DC | PRN
  Administered 2013-07-13: 50 ug via INTRAVENOUS

## 2013-07-13 MED ORDER — LACTATED RINGERS IV SOLN
INTRAVENOUS | Status: DC
  Administered 2013-07-13: 08:00:00 via INTRAVENOUS

## 2013-07-13 MED ORDER — OXYCODONE HCL 5 MG PO TABS: 5.0000 mg | ORAL_TABLET | Freq: Once | ORAL | Status: DC | PRN

## 2013-07-13 MED ORDER — ONDANSETRON HCL 4 MG/2ML IJ SOLN
INTRAMUSCULAR | Status: DC | PRN
  Administered 2013-07-13: 4 mg via INTRAVENOUS

## 2013-07-13 MED ORDER — MIDAZOLAM HCL 2 MG/2ML IJ SOLN: 1.0000 mg | INTRAMUSCULAR | Status: DC | PRN

## 2013-07-13 MED ORDER — ONDANSETRON HCL 4 MG/2ML IJ SOLN: 4.0000 mg | Freq: Once | INTRAMUSCULAR | Status: DC | PRN

## 2013-07-13 MED ORDER — LIDOCAINE HCL (CARDIAC) 20 MG/ML IV SOLN
INTRAVENOUS | Status: DC | PRN
  Administered 2013-07-13: 50 mg via INTRAVENOUS

## 2013-07-13 MED ORDER — DEXAMETHASONE SODIUM PHOSPHATE 4 MG/ML IJ SOLN
INTRAMUSCULAR | Status: DC | PRN
  Administered 2013-07-13: 10 mg via INTRAVENOUS

## 2013-07-13 MED ORDER — ALBUTEROL SULFATE (2.5 MG/3ML) 0.083% IN NEBU: 2.5000 mg | INHALATION_SOLUTION | Freq: Once | RESPIRATORY_TRACT | Status: DC | PRN

## 2013-07-13 MED ORDER — ALBUTEROL SULFATE HFA 108 (90 BASE) MCG/ACT IN AERS
INHALATION_SPRAY | RESPIRATORY_TRACT | Status: DC | PRN
  Administered 2013-07-13: 2 via RESPIRATORY_TRACT

## 2013-07-13 MED ORDER — OXYCODONE HCL 5 MG/5ML PO SOLN: 5.0000 mg | Freq: Once | ORAL | Status: DC | PRN

## 2013-07-13 MED ORDER — FENTANYL CITRATE 0.05 MG/ML IJ SOLN
INTRAMUSCULAR | Status: AC
  Filled 2013-07-13: qty 6

## 2013-07-13 MED ORDER — FENTANYL CITRATE 0.05 MG/ML IJ SOLN: 50.0000 ug | INTRAMUSCULAR | Status: DC | PRN

## 2013-07-13 MED ORDER — EPINEPHRINE HCL 1 MG/ML IJ SOLN
INTRAMUSCULAR | Status: DC | PRN
  Administered 2013-07-13: 1 mL

## 2013-07-13 MED ORDER — MIDAZOLAM HCL 2 MG/2ML IJ SOLN
INTRAMUSCULAR | Status: AC
  Filled 2013-07-13: qty 2

## 2013-07-13 MED ORDER — EPINEPHRINE HCL 1 MG/ML IJ SOLN
INTRAMUSCULAR | Status: AC
  Filled 2013-07-13: qty 1

## 2013-07-13 MED ORDER — HYDROMORPHONE HCL PF 1 MG/ML IJ SOLN: 0.2500 mg | INTRAMUSCULAR | Status: DC | PRN

## 2013-07-13 SURGICAL SUPPLY — 32 items
CANISTER SUCT 1200ML W/VALVE (MISCELLANEOUS) ×3 IMPLANT
CONT SPEC 4OZ CLIKSEAL STRL BL (MISCELLANEOUS) IMPLANT
ELECT REM PT RETURN 9FT ADLT (ELECTROSURGICAL) ×3
ELECTRODE REM PT RTRN 9FT ADLT (ELECTROSURGICAL) ×1 IMPLANT
GLOVE BIOGEL PI IND STRL 7.0 (GLOVE) ×1 IMPLANT
GLOVE BIOGEL PI INDICATOR 7.0 (GLOVE) ×2
GLOVE SS BIOGEL STRL SZ 7.5 (GLOVE) ×1 IMPLANT
GLOVE SUPERSENSE BIOGEL SZ 7.5 (GLOVE) ×2
GLOVE SURG SS PI 6.5 STRL IVOR (GLOVE) ×3 IMPLANT
GLOVE SURG SS PI 7.0 STRL IVOR (GLOVE) ×3 IMPLANT
GOWN STRL REUS W/ TWL LRG LVL3 (GOWN DISPOSABLE) ×2 IMPLANT
GOWN STRL REUS W/ TWL XL LVL3 (GOWN DISPOSABLE) IMPLANT
GOWN STRL REUS W/TWL LRG LVL3 (GOWN DISPOSABLE) ×4
GOWN STRL REUS W/TWL XL LVL3 (GOWN DISPOSABLE)
MARKER SKIN DUAL TIP RULER LAB (MISCELLANEOUS) IMPLANT
NDL SAFETY ECLIPSE 18X1.5 (NEEDLE) ×1 IMPLANT
NEEDLE HYPO 18GX1.5 SHARP (NEEDLE) ×2
NEEDLE SPNL 22GX7 QUINCKE BK (NEEDLE) IMPLANT
NS IRRIG 1000ML POUR BTL (IV SOLUTION) ×3 IMPLANT
PATTIES SURGICAL .5 X3 (DISPOSABLE) ×3 IMPLANT
PENCIL BUTTON HOLSTER BLD 10FT (ELECTRODE) ×3 IMPLANT
SHEET MEDIUM DRAPE 40X70 STRL (DRAPES) ×3 IMPLANT
SLEEVE SCD COMPRESS KNEE MED (MISCELLANEOUS) ×3 IMPLANT
SOLUTION ANTI FOG 6CC (MISCELLANEOUS) ×3 IMPLANT
SOLUTION BUTLER CLEAR DIP (MISCELLANEOUS) ×3 IMPLANT
SPONGE GAUZE 4X4 12PLY STER LF (GAUZE/BANDAGES/DRESSINGS) ×6 IMPLANT
SURGILUBE 2OZ TUBE FLIPTOP (MISCELLANEOUS) IMPLANT
SYR 5ML LL (SYRINGE) ×3 IMPLANT
SYR CONTROL 10ML LL (SYRINGE) ×3 IMPLANT
TOWEL OR 17X24 6PK STRL BLUE (TOWEL DISPOSABLE) ×3 IMPLANT
TUBE CONNECTING 20'X1/4 (TUBING) ×1
TUBE CONNECTING 20X1/4 (TUBING) ×2 IMPLANT

## 2013-07-13 NOTE — Anesthesia Procedure Notes (Signed)
Procedure Name: Intubation Date/Time: 07/13/2013 7:52 AM Performed by: Marrianne Mood Pre-anesthesia Checklist: Patient identified, Emergency Drugs available, Suction available, Patient being monitored and Timeout performed Patient Re-evaluated:Patient Re-evaluated prior to inductionOxygen Delivery Method: Circle System Utilized Preoxygenation: Pre-oxygenation with 100% oxygen Intubation Type: IV induction Ventilation: Mask ventilation without difficulty Grade View: Grade III Tube type: Oral Tube size: 6.0 mm Number of attempts: 1 Airway Equipment and Method: stylet and oral airway Placement Confirmation: ETT inserted through vocal cords under direct vision,  positive ETCO2 and breath sounds checked- equal and bilateral Secured at: 23 cm Tube secured with: Tape Dental Injury: Teeth and Oropharynx as per pre-operative assessment

## 2013-07-13 NOTE — Interval H&P Note (Signed)
History and Physical Interval Note:  07/13/2013 7:38 AM  Clifford Cook  has presented today for surgery, with the diagnosis of Neoplasm of uncertain behavior of lip, oral cavity, and pharynx   The various methods of treatment have been discussed with the patient and family. After consideration of risks, benefits and other options for treatment, the patient has consented to  Procedure(s): DIRECT LARYNGOSCOPY WITH EXCISION TUMOR (N/A) as a surgical intervention .  The patient's history has been reviewed, patient examined, no change in status, stable for surgery.  I have reviewed the patient's chart and labs.  Questions were answered to the patient's satisfaction.     NEWMAN, CHRISTOPHER

## 2013-07-13 NOTE — Anesthesia Postprocedure Evaluation (Signed)
  Anesthesia Post-op Note  Patient: Clifford Cook  Procedure(s) Performed: Procedure(s): DIRECT LARYNGOSCOPY WITH EXCISION TUMOR (N/A)  Patient Location: PACU  Anesthesia Type:General  Level of Consciousness: awake, alert  and oriented  Airway and Oxygen Therapy: Patient Spontanous Breathing  Post-op Pain: none  Post-op Assessment: Post-op Vital signs reviewed  Post-op Vital Signs: Reviewed  Last Vitals:  Filed Vitals:   07/13/13 0900  BP: 101/49  Pulse: 77  Temp:   Resp: 13    Complications: No apparent anesthesia complications

## 2013-07-13 NOTE — Brief Op Note (Signed)
07/13/2013  8:30 AM  PATIENT:  Villa Herb  68 y.o. male  PRE-OPERATIVE DIAGNOSIS:  Neoplasm of uncertain behavior of lip, oral cavity, and pharynx   POST-OPERATIVE DIAGNOSIS:  Neoplasm of uncertain behavior of lip, oral cavity, and pharynx   PROCEDURE:  Procedure(s): DIRECT LARYNGOSCOPY WITH EXCISION TUMOR (N/A)  SURGEON:  Surgeon(s) and Role:    * Rozetta Nunnery, MD - Primary  PHYSICIAN ASSISTANT:   ASSISTANTS: none   ANESTHESIA:   general  EBL:  Total I/O In: 400 [I.V.:400] Out: -   BLOOD ADMINISTERED:none  DRAINS: none   LOCAL MEDICATIONS USED:  NONE  SPECIMEN:  Source of Specimen:  posterior hypopharngeal wall  DISPOSITION OF SPECIMEN:  PATHOLOGY  COUNTS:  YES  TOURNIQUET:  * No tourniquets in log *  DICTATION: .Other Dictation: Dictation Number [id number]  PLAN OF CARE: Discharge to home after PACU  PATIENT DISPOSITION:  PACU - hemodynamically stable.   Delay start of Pharmacological VTE agent (>24hrs) due to surgical blood loss or risk of bleeding: yes

## 2013-07-13 NOTE — Op Note (Signed)
NAMEVERNEL, LANGENDERFER NO.:  [id number]  MEDICAL RECORD NO.:  [id number]  LOCATION:                                FACILITY:  [facility]  PHYSICIAN:  Leonides Sake. Lucia Gaskins, M.D.DATE OF BIRTH:  [birth year]  DATE OF PROCEDURE:  07/13/2013 DATE OF DISCHARGE:  07/13/2013                              OPERATIVE REPORT   PREOPERATIVE DIAGNOSIS:  Hypopharyngeal lesion.  POSTOPERATIVE DIAGNOSIS:  Hypopharyngeal lesion.  OPERATION PERFORMED:  Direct laryngoscopy with excisional biopsy of posterior hypopharyngeal lesion.  SURGEON:  Leonides Sake. Lucia Gaskins, M.D.  ANESTHESIA:  General endotracheal.  COMPLICATIONS:  None.  BRIEF CLINICAL NOTE:  Clifford Cook is a 68 year old gentleman who has had history of cirrhosis and history of upper GI bleed.  During upper endoscopy with Dr. Watt Climes, he was noted to have a lesion in the posterior hypopharyngeal wall at approximate level of the arytenoid cartilage.  On fiberoptic laryngoscopy examination in the office, the patient has a 6-8 mm papillomatous raised lesion off the posterior hypopharyngeal wall at approximate level of the arytenoid cartilage.  He was taken to the operating room this time for direct laryngoscopy and excision of the hypopharyngeal lesion.  DESCRIPTION OF PROCEDURE:  After adequate endotracheal anesthesia, direct laryngoscopy was performed.  On direct laryngoscopy, the base of tongue, vallecula, epiglottis were all normal.  Both piriform sinuses were clear.  Vocal cords were clear.  False cords were clear.  On the posterior-inferior hypopharyngeal wall just above the level of the arytenoid cartilage more on the right side, there was a exophytic papillomatous appearing lesion.  Photos were obtained.  Then, using cup forceps and scissors, the lesion was totally excised with surrounding 1- 2 mm of benign-appearing mucosa.  This was sent to Pathology. Hemostasis was obtained with a cotton pledgets soaked in  adrenaline followed by cauterization of the base of the excision site.  This completed the procedure.  [name] was awoken from anesthesia and transferred to recovery room, postop doing well.  DISPOSITION:  Clifford Cook is discharged home later this morning.  He is instructed on Tylenol for pain and use of mouth lozenges for sore throat.  No diet limitations.  We will have him follow up in my office in one week for recheck and review of final pathology.          ______________________________ Leonides Sake Lucia Gaskins, M.D.     CEN/MEDQ  D:  07/13/2013  T:  07/13/2013  Job:  [id number]  cc:   Jeryl Columbia, M.D. Kathyrn Lass, M.D.

## 2013-07-13 NOTE — Discharge Instructions (Addendum)
Diet as tolerated Tylenol or motrin prn pain Use throat lozenges for sore throat Call office for follow up appt in 6-7 days   [id number] Take your regular meds   Post Anesthesia Home Care Instructions  Activity: Get plenty of rest for the remainder of the day. A responsible adult should stay with you for 24 hours following the procedure.  For the next 24 hours, DO NOT: -Drive a car -Paediatric nurse -Drink alcoholic beverages -Take any medication unless instructed by your physician -Make any legal decisions or sign important papers.  Meals: Start with liquid foods such as gelatin or soup. Progress to regular foods as tolerated. Avoid greasy, spicy, heavy foods. If nausea and/or vomiting occur, drink only clear liquids until the nausea and/or vomiting subsides. Call your physician if vomiting continues.  Special Instructions/Symptoms: Your throat may feel dry or sore from the anesthesia or the breathing tube placed in your throat during surgery. If this causes discomfort, gargle with warm salt water. The discomfort should disappear within 24 hours.

## 2013-07-13 NOTE — Transfer of Care (Signed)
Immediate Anesthesia Transfer of Care Note  Patient: Clifford Cook  Procedure(s) Performed: Procedure(s): DIRECT LARYNGOSCOPY WITH EXCISION TUMOR (N/A)  Patient Location: PACU  Anesthesia Type:General  Level of Consciousness: awake, alert , oriented and patient cooperative  Airway & Oxygen Therapy: Patient Spontanous Breathing and aerosol face mask  Post-op Assessment: Report given to PACU RN and Post -op Vital signs reviewed and stable  Post vital signs: Reviewed and stable  Complications: No apparent anesthesia complications

## 2013-07-13 NOTE — Anesthesia Preprocedure Evaluation (Addendum)
Anesthesia Evaluation  Patient identified by MRN, date of birth, ID band Patient awake    Reviewed: Allergy & Precautions, H&P , NPO status , Patient's Chart, lab work & pertinent test results  Airway Mallampati: II TM Distance: >3 FB Neck ROM: Full    Dental  (+) Teeth Intact, Dental Advisory Given   Pulmonary Current Smoker,  breath sounds clear to auscultation        Cardiovascular Rhythm:Regular Rate:Normal     Neuro/Psych    GI/Hepatic GERD-  Medicated and Controlled,(+) Cirrhosis -      ,   Endo/Other    Renal/GU      Musculoskeletal   Abdominal   Peds  Hematology   Anesthesia Other Findings   Reproductive/Obstetrics                          Anesthesia Physical Anesthesia Plan  ASA: III  Anesthesia Plan: General   Post-op Pain Management:    Induction: Intravenous  Airway Management Planned: Oral ETT  Additional Equipment:   Intra-op Plan:   Post-operative Plan: Extubation in OR  Informed Consent: I have reviewed the patients History and Physical, chart, labs and discussed the procedure including the risks, benefits and alternatives for the proposed anesthesia with the patient or authorized representative who has indicated his/her understanding and acceptance.   Dental advisory given  Plan Discussed with: CRNA, Anesthesiologist and Surgeon  Anesthesia Plan Comments:         Anesthesia Quick Evaluation

## 2013-07-14 ENCOUNTER — Encounter (HOSPITAL_BASED_OUTPATIENT_CLINIC_OR_DEPARTMENT_OTHER): Payer: Self-pay | Admitting: Otolaryngology

## 2013-09-07 ENCOUNTER — Ambulatory Visit (HOSPITAL_COMMUNITY)
Admission: RE | Admit: 2013-09-07 | Discharge: 2013-09-07 | Disposition: A | Discharge status: Home / Self Care | Payer: 59 | Source: Ambulatory Visit | Attending: Family Medicine | Admitting: Family Medicine

## 2013-09-07 ENCOUNTER — Other Ambulatory Visit (HOSPITAL_COMMUNITY): Payer: Self-pay | Admitting: Family Medicine

## 2013-09-07 DIAGNOSIS — Z720 Tobacco use: Secondary | ICD-10-CM

## 2013-09-07 DIAGNOSIS — R599 Enlarged lymph nodes, unspecified: Secondary | ICD-10-CM | POA: Diagnosis not present

## 2013-09-07 DIAGNOSIS — M25561 Pain in right knee: Secondary | ICD-10-CM

## 2013-09-07 DIAGNOSIS — M79609 Pain in unspecified limb: Secondary | ICD-10-CM

## 2013-09-07 DIAGNOSIS — M25569 Pain in unspecified knee: Secondary | ICD-10-CM | POA: Insufficient documentation

## 2013-09-07 DIAGNOSIS — M7989 Other specified soft tissue disorders: Secondary | ICD-10-CM

## 2013-09-07 NOTE — Progress Notes (Signed)
Bilateral lower extremity venous duplex completed:  No evidence of DVT or superficial thrombosis.  There appears to be a Baker's cyst in the right popliteal fossa.   

## 2013-10-27 ENCOUNTER — Ambulatory Visit
Admission: RE | Admit: 2013-10-27 | Discharge: 2013-10-27 | Disposition: A | Discharge status: Home / Self Care | Payer: Medicare Other | Source: Ambulatory Visit | Attending: Family Medicine | Admitting: Family Medicine

## 2013-10-27 ENCOUNTER — Other Ambulatory Visit: Payer: Self-pay | Admitting: Family Medicine

## 2013-10-27 DIAGNOSIS — M545 Low back pain: Secondary | ICD-10-CM

## 2013-10-31 ENCOUNTER — Emergency Department (HOSPITAL_COMMUNITY): Payer: Medicare Other

## 2013-10-31 ENCOUNTER — Encounter (HOSPITAL_COMMUNITY): Payer: Self-pay | Admitting: Emergency Medicine

## 2013-10-31 ENCOUNTER — Inpatient Hospital Stay (HOSPITAL_COMMUNITY)
Admission: EM | Admit: 2013-10-31 | Discharge: 2013-11-09 | DRG: 871 | Disposition: A | Discharge status: Skilled Nursing Facility | Payer: Medicare Other | Attending: Internal Medicine | Admitting: Internal Medicine

## 2013-10-31 DIAGNOSIS — E871 Hypo-osmolality and hyponatremia: Secondary | ICD-10-CM | POA: Diagnosis present

## 2013-10-31 DIAGNOSIS — I248 Other forms of acute ischemic heart disease: Secondary | ICD-10-CM | POA: Diagnosis present

## 2013-10-31 DIAGNOSIS — R7881 Bacteremia: Secondary | ICD-10-CM

## 2013-10-31 DIAGNOSIS — S2239XA Fracture of one rib, unspecified side, initial encounter for closed fracture: Secondary | ICD-10-CM | POA: Diagnosis present

## 2013-10-31 DIAGNOSIS — E876 Hypokalemia: Secondary | ICD-10-CM | POA: Diagnosis present

## 2013-10-31 DIAGNOSIS — R296 Repeated falls: Secondary | ICD-10-CM | POA: Diagnosis present

## 2013-10-31 DIAGNOSIS — Z79899 Other long term (current) drug therapy: Secondary | ICD-10-CM | POA: Diagnosis not present

## 2013-10-31 DIAGNOSIS — J9691 Respiratory failure, unspecified with hypoxia: Secondary | ICD-10-CM | POA: Diagnosis present

## 2013-10-31 DIAGNOSIS — D6959 Other secondary thrombocytopenia: Secondary | ICD-10-CM | POA: Diagnosis present

## 2013-10-31 DIAGNOSIS — R748 Abnormal levels of other serum enzymes: Secondary | ICD-10-CM | POA: Diagnosis present

## 2013-10-31 DIAGNOSIS — Z8711 Personal history of peptic ulcer disease: Secondary | ICD-10-CM

## 2013-10-31 DIAGNOSIS — D696 Thrombocytopenia, unspecified: Secondary | ICD-10-CM | POA: Diagnosis present

## 2013-10-31 DIAGNOSIS — D689 Coagulation defect, unspecified: Secondary | ICD-10-CM | POA: Diagnosis present

## 2013-10-31 DIAGNOSIS — K7460 Unspecified cirrhosis of liver: Secondary | ICD-10-CM

## 2013-10-31 DIAGNOSIS — F17210 Nicotine dependence, cigarettes, uncomplicated: Secondary | ICD-10-CM | POA: Diagnosis present

## 2013-10-31 DIAGNOSIS — Z880 Allergy status to penicillin: Secondary | ICD-10-CM

## 2013-10-31 DIAGNOSIS — R17 Unspecified jaundice: Secondary | ICD-10-CM

## 2013-10-31 DIAGNOSIS — N178 Other acute kidney failure: Secondary | ICD-10-CM

## 2013-10-31 DIAGNOSIS — N179 Acute kidney failure, unspecified: Secondary | ICD-10-CM | POA: Diagnosis present

## 2013-10-31 DIAGNOSIS — E8770 Fluid overload, unspecified: Secondary | ICD-10-CM | POA: Diagnosis present

## 2013-10-31 DIAGNOSIS — Z7983 Long term (current) use of bisphosphonates: Secondary | ICD-10-CM | POA: Diagnosis not present

## 2013-10-31 DIAGNOSIS — R600 Localized edema: Secondary | ICD-10-CM | POA: Diagnosis present

## 2013-10-31 DIAGNOSIS — Z79891 Long term (current) use of opiate analgesic: Secondary | ICD-10-CM | POA: Diagnosis not present

## 2013-10-31 DIAGNOSIS — Z9841 Cataract extraction status, right eye: Secondary | ICD-10-CM | POA: Diagnosis not present

## 2013-10-31 DIAGNOSIS — K652 Spontaneous bacterial peritonitis: Secondary | ICD-10-CM

## 2013-10-31 DIAGNOSIS — Z720 Tobacco use: Secondary | ICD-10-CM

## 2013-10-31 DIAGNOSIS — A419 Sepsis, unspecified organism: Secondary | ICD-10-CM | POA: Diagnosis present

## 2013-10-31 DIAGNOSIS — W19XXXA Unspecified fall, initial encounter: Secondary | ICD-10-CM | POA: Diagnosis present

## 2013-10-31 DIAGNOSIS — D509 Iron deficiency anemia, unspecified: Secondary | ICD-10-CM | POA: Diagnosis present

## 2013-10-31 DIAGNOSIS — J189 Pneumonia, unspecified organism: Secondary | ICD-10-CM | POA: Diagnosis present

## 2013-10-31 DIAGNOSIS — Z9842 Cataract extraction status, left eye: Secondary | ICD-10-CM | POA: Diagnosis not present

## 2013-10-31 DIAGNOSIS — A408 Other streptococcal sepsis: Secondary | ICD-10-CM | POA: Diagnosis present

## 2013-10-31 DIAGNOSIS — J9601 Acute respiratory failure with hypoxia: Secondary | ICD-10-CM

## 2013-10-31 DIAGNOSIS — E8809 Other disorders of plasma-protein metabolism, not elsewhere classified: Secondary | ICD-10-CM | POA: Diagnosis present

## 2013-10-31 DIAGNOSIS — D638 Anemia in other chronic diseases classified elsewhere: Secondary | ICD-10-CM | POA: Diagnosis present

## 2013-10-31 DIAGNOSIS — R7989 Other specified abnormal findings of blood chemistry: Secondary | ICD-10-CM | POA: Diagnosis present

## 2013-10-31 DIAGNOSIS — J154 Pneumonia due to other streptococci: Secondary | ICD-10-CM

## 2013-10-31 DIAGNOSIS — R6520 Severe sepsis without septic shock: Secondary | ICD-10-CM | POA: Diagnosis present

## 2013-10-31 DIAGNOSIS — R0602 Shortness of breath: Secondary | ICD-10-CM

## 2013-10-31 DIAGNOSIS — R140 Abdominal distension (gaseous): Secondary | ICD-10-CM | POA: Diagnosis present

## 2013-10-31 DIAGNOSIS — S2232XA Fracture of one rib, left side, initial encounter for closed fracture: Secondary | ICD-10-CM

## 2013-10-31 DIAGNOSIS — K766 Portal hypertension: Secondary | ICD-10-CM | POA: Diagnosis present

## 2013-10-31 DIAGNOSIS — R531 Weakness: Secondary | ICD-10-CM | POA: Diagnosis present

## 2013-10-31 DIAGNOSIS — K219 Gastro-esophageal reflux disease without esophagitis: Secondary | ICD-10-CM | POA: Diagnosis present

## 2013-10-31 DIAGNOSIS — B955 Unspecified streptococcus as the cause of diseases classified elsewhere: Secondary | ICD-10-CM

## 2013-10-31 DIAGNOSIS — K7469 Other cirrhosis of liver: Secondary | ICD-10-CM | POA: Diagnosis present

## 2013-10-31 DIAGNOSIS — R778 Other specified abnormalities of plasma proteins: Secondary | ICD-10-CM

## 2013-10-31 DIAGNOSIS — K7581 Nonalcoholic steatohepatitis (NASH): Secondary | ICD-10-CM

## 2013-10-31 DIAGNOSIS — R188 Other ascites: Secondary | ICD-10-CM | POA: Diagnosis present

## 2013-10-31 DIAGNOSIS — R000 Tachycardia, unspecified: Secondary | ICD-10-CM

## 2013-10-31 HISTORY — DX: Pneumonia due to other streptococci: J15.4

## 2013-10-31 HISTORY — DX: Hypo-osmolality and hyponatremia: E87.1

## 2013-10-31 HISTORY — DX: Fracture of one rib, unspecified side, initial encounter for closed fracture: S22.39XA

## 2013-10-31 HISTORY — DX: Respiratory failure, unspecified with hypoxia: J96.91

## 2013-10-31 LAB — BLOOD GAS, ARTERIAL
Acid-Base Excess: 0.4 mmol/L (ref 0.0–2.0)
Bicarbonate: 23.7 mEq/L (ref 20.0–24.0)
DRAWN BY: 331471
O2 CONTENT: 3 L/min
O2 SAT: 91.8 %
Patient temperature: 98.6
TCO2: 22.1 mmol/L (ref 0–100)
pCO2 arterial: 34.8 mmHg — ABNORMAL LOW (ref 35.0–45.0)
pH, Arterial: 7.449 (ref 7.350–7.450)
pO2, Arterial: 62.9 mmHg — ABNORMAL LOW (ref 80.0–100.0)

## 2013-10-31 LAB — PROTIME-INR
INR: 1.6 — AB (ref 0.00–1.49)
Prothrombin Time: 19.2 seconds — ABNORMAL HIGH (ref 11.6–15.2)

## 2013-10-31 LAB — CBC WITH DIFFERENTIAL/PLATELET
BASOS ABS: 0 10*3/uL (ref 0.0–0.1)
Basophils Relative: 0 % (ref 0–1)
Eosinophils Absolute: 0.2 10*3/uL (ref 0.0–0.7)
Eosinophils Relative: 1 % (ref 0–5)
HEMATOCRIT: 27.7 % — AB (ref 39.0–52.0)
Hemoglobin: 9.5 g/dL — ABNORMAL LOW (ref 13.0–17.0)
LYMPHS PCT: 4 % — AB (ref 12–46)
Lymphs Abs: 0.8 10*3/uL (ref 0.7–4.0)
MCH: 32.1 pg (ref 26.0–34.0)
MCHC: 34.3 g/dL (ref 30.0–36.0)
MCV: 93.6 fL (ref 78.0–100.0)
Monocytes Absolute: 0.6 10*3/uL (ref 0.1–1.0)
Monocytes Relative: 3 % (ref 3–12)
Neutro Abs: 18.1 10*3/uL — ABNORMAL HIGH (ref 1.7–7.7)
Neutrophils Relative %: 92 % — ABNORMAL HIGH (ref 43–77)
PLATELETS: 106 10*3/uL — AB (ref 150–400)
RBC: 2.96 MIL/uL — ABNORMAL LOW (ref 4.22–5.81)
RDW: 17.9 % — AB (ref 11.5–15.5)
WBC Morphology: INCREASED
WBC: 19.7 10*3/uL — AB (ref 4.0–10.5)

## 2013-10-31 LAB — URINALYSIS, ROUTINE W REFLEX MICROSCOPIC
GLUCOSE, UA: NEGATIVE mg/dL
Hgb urine dipstick: NEGATIVE
Ketones, ur: NEGATIVE mg/dL
Leukocytes, UA: NEGATIVE
NITRITE: NEGATIVE
PH: 5 (ref 5.0–8.0)
Protein, ur: NEGATIVE mg/dL
Specific Gravity, Urine: 1.013 (ref 1.005–1.030)
Urobilinogen, UA: 1 mg/dL (ref 0.0–1.0)

## 2013-10-31 LAB — COMPREHENSIVE METABOLIC PANEL
ALBUMIN: 2.1 g/dL — AB (ref 3.5–5.2)
ALK PHOS: 111 U/L (ref 39–117)
ALT: 49 U/L (ref 0–53)
AST: 118 U/L — ABNORMAL HIGH (ref 0–37)
Anion gap: 15 (ref 5–15)
BUN: 25 mg/dL — ABNORMAL HIGH (ref 6–23)
CO2: 23 mEq/L (ref 19–32)
Calcium: 7.9 mg/dL — ABNORMAL LOW (ref 8.4–10.5)
Chloride: 85 mEq/L — ABNORMAL LOW (ref 96–112)
Creatinine, Ser: 1.3 mg/dL (ref 0.50–1.35)
GFR calc Af Amer: 63 mL/min — ABNORMAL LOW (ref >90)
GFR calc non Af Amer: 54 mL/min — ABNORMAL LOW (ref >90)
Glucose, Bld: 79 mg/dL (ref 70–99)
Potassium: 4.9 mEq/L (ref 3.7–5.3)
SODIUM: 123 meq/L — AB (ref 137–147)
TOTAL PROTEIN: 5.7 g/dL — AB (ref 6.0–8.3)
Total Bilirubin: 4.2 mg/dL — ABNORMAL HIGH (ref 0.3–1.2)

## 2013-10-31 LAB — TROPONIN I
TROPONIN I: 0.82 ng/mL — AB (ref <0.30)
Troponin I: 0.45 ng/mL (ref <0.30)

## 2013-10-31 LAB — I-STAT CG4 LACTIC ACID, ED: Lactic Acid, Venous: 3.44 mmol/L — ABNORMAL HIGH (ref 0.5–2.2)

## 2013-10-31 LAB — AMMONIA: Ammonia: 70 umol/L — ABNORMAL HIGH (ref 11–60)

## 2013-10-31 LAB — LIPASE, BLOOD: LIPASE: 30 U/L (ref 11–59)

## 2013-10-31 LAB — FERRITIN: FERRITIN: 134 ng/mL (ref 22–322)

## 2013-10-31 LAB — POC OCCULT BLOOD, ED: Fecal Occult Bld: NEGATIVE

## 2013-10-31 MED ORDER — VANCOMYCIN HCL IN DEXTROSE 1-5 GM/200ML-% IV SOLN
1000.0000 mg | Freq: Once | INTRAVENOUS | Status: AC
  Administered 2013-10-31: 1000 mg via INTRAVENOUS
  Filled 2013-10-31: qty 200

## 2013-10-31 MED ORDER — IOHEXOL 350 MG/ML SOLN
100.0000 mL | Freq: Once | INTRAVENOUS | Status: AC | PRN
  Administered 2013-10-31: 100 mL via INTRAVENOUS

## 2013-10-31 MED ORDER — SODIUM CHLORIDE 0.9 % IV BOLUS (SEPSIS)
1000.0000 mL | Freq: Once | INTRAVENOUS | Status: AC
  Administered 2013-10-31: 1000 mL via INTRAVENOUS

## 2013-10-31 MED ORDER — SODIUM CHLORIDE 0.9 % IV BOLUS (SEPSIS)
250.0000 mL | Freq: Once | INTRAVENOUS | Status: AC
  Administered 2013-10-31: 250 mL via INTRAVENOUS

## 2013-10-31 MED ORDER — CIPROFLOXACIN IN D5W 400 MG/200ML IV SOLN
400.0000 mg | Freq: Once | INTRAVENOUS | Status: AC
  Administered 2013-10-31: 400 mg via INTRAVENOUS
  Filled 2013-10-31: qty 200

## 2013-10-31 MED ORDER — METRONIDAZOLE IN NACL 5-0.79 MG/ML-% IV SOLN
500.0000 mg | Freq: Once | INTRAVENOUS | Status: AC
  Administered 2013-10-31: 500 mg via INTRAVENOUS
  Filled 2013-10-31: qty 100

## 2013-10-31 MED ORDER — VANCOMYCIN HCL IN DEXTROSE 750-5 MG/150ML-% IV SOLN
750.0000 mg | Freq: Two times a day (BID) | INTRAVENOUS | Status: DC
  Administered 2013-11-01 (×2): 750 mg via INTRAVENOUS
  Filled 2013-10-31 (×3): qty 150

## 2013-10-31 MED ORDER — SODIUM CHLORIDE 0.9 % IV BOLUS (SEPSIS)
1000.0000 mL | INTRAVENOUS | Status: AC
  Administered 2013-10-31: 1000 mL via INTRAVENOUS

## 2013-10-31 NOTE — ED Notes (Signed)
Patient transported to CT 

## 2013-10-31 NOTE — H&P (Signed)
PATIENT DETAILS Name: Clifford Cook Age: 68 y.o. Sex: male Date of Birth: [birth year] Admit Date: 10/31/2013 EPP:IRJJOA, Serina Cowper, MD   CHIEF COMPLAINT:  Generalized weakness, worsening lower extremity edema, abdominal distention,frequent falls - one week  HPI: Clifford Cook is a 68 y.o. male with a Past Medical History of cirrhosis (likely secondary to [place]), history of upper GI bleed in 2014 who presents today with the above noted complaint. Apparently, one week or so ago, patient started feeling significantly weak, and the weakness has slowly progressed. Weakness is generalized, because of weakness, patient has had 2 falls on Wednesday and Saturday. He saw his primary care practitioner in a few days back with similar complaints, and diuretic dosage was increased. He unfortunately continues to have worsening lower extremity edema, and claims that his abdomen is now distended more than usual. Because of continued weakness, patient was brought to the emergency room, where he was found to have leukocytosis, jaundice, and likely pneumonia. I was asked to admit this patient for further evaluation and treatment. Per patient and family, no history of fever or fever-like symptoms at home. No history of headache. Patient denies cough. He also denies any nausea vomiting or diarrhea. He does have mild bilateral flank pain in his abdomen, but denies RUQ pain. There is a history of confusion.  ALLERGIES:   Allergies  Allergen Reactions  . Penicillins Other (See Comments)    Rash hives, white spots.    PAST MEDICAL HISTORY: Past Medical History  Diagnosis Date  . Stomach ulcer   . GI bleed   . Liver disorder   . Cirrhosis   . GERD (gastroesophageal reflux disease)     PAST SURGICAL HISTORY: Past Surgical History  Procedure Laterality Date  . Cataract extraction, bilateral    . Surgery for lazy eye      age 48  . Esophagogastroduodenoscopy N/A 04/16/2012    Procedure:  ESOPHAGOGASTRODUODENOSCOPY (EGD);  Surgeon: Jeryl Columbia, MD;  Location: [facility];  Service: Endoscopy;  Laterality: N/A;  . Esophagogastroduodenoscopy N/A 04/17/2012    Procedure: ESOPHAGOGASTRODUODENOSCOPY (EGD);  Surgeon: Jeryl Columbia, MD;  Location: [facility];  Service: Endoscopy;  Laterality: N/A;  . Direct laryngoscopy N/A 07/13/2013    Procedure: DIRECT LARYNGOSCOPY WITH EXCISION TUMOR;  Surgeon: Rozetta Nunnery, MD;  Location: [facility];  Service: ENT;  Laterality: N/A;    MEDICATIONS AT HOME: Prior to Admission medications   Medication Sig Start Date End Date Taking? Authorizing Provider  alendronate (FOSAMAX) 70 MG tablet Take 70 mg by mouth once a week. Take with a full glass of water on an empty stomach.   Yes Historical Provider, MD  diphenhydrAMINE (BENADRYL) 25 mg capsule Take 25 mg by mouth at bedtime as needed for itching or sleep (itching & sleep).    Yes Historical Provider, MD  diphenhydramine-acetaminophen (TYLENOL PM) 25-500 MG TABS Take 1 tablet by mouth at bedtime as needed (pain).   Yes Historical Provider, MD  ferrous sulfate 325 (65 FE) MG tablet Take 325 mg by mouth daily with breakfast.   Yes Historical Provider, MD  furosemide (LASIX) 40 MG tablet Take 40 mg by mouth 2 (two) times daily.    Yes Historical Provider, MD  HYDROmorphone (DILAUDID) 2 MG tablet Take 2 mg by mouth every 4 (four) hours as needed for severe pain (pain).   Yes Historical Provider, MD  hydrOXYzine (ATARAX/VISTARIL) 25 MG tablet Take 25 mg by mouth  every 6 (six) hours as needed for itching (itching).   Yes Historical Provider, MD  Magnesium 250 MG TABS Take 250 mg by mouth daily.   Yes Historical Provider, MD  potassium chloride (KLOR-CON) 20 MEQ packet Take 40 mEq by mouth daily.   Yes Historical Provider, MD  spironolactone (ALDACTONE) 50 MG tablet Take 100 mg by mouth daily.   Yes Historical Provider, MD  traMADol (ULTRAM) 50 MG tablet Take 50 mg by mouth every 6  (six) hours as needed for moderate pain or severe pain (pain).    Yes Historical Provider, MD  Vitamin D, Ergocalciferol, (DRISDOL) 50000 UNITS CAPS capsule Take 50,000 Units by mouth every 7 (seven) days.   Yes Historical Provider, MD    FAMILY HISTORY: Sister-cirrhosis  SOCIAL HISTORY:  reports that he has been smoking Cigarettes.  He has been smoking about 1.00 pack per day. He has never used smokeless tobacco. He reports that he does not drink alcohol or use illicit drugs.  REVIEW OF SYSTEMS:  Constitutional:   No  weight loss,  Fevers, chills  HEENT:    No headaches, Difficulty swallowing,Tooth/dental problems,Sore throat,    Cardio-vascular: No chest pain,  Orthopnea, PND,  dizziness, palpitations  GI:  No heartburn, indigestion, abdominal pain, nausea, vomiting, diarrhea,.  Resp: No shortness of breath with exertion or at rest.  No excess mucus, no productive cough, No non-productive cough,  No coughing up of blood.  Skin:  no rash or lesions.  GU:  no dysuria, change in color of urine, no urgency or frequency.  No flank pain.  Musculoskeletal: No joint pain or swelling.  No decreased range of motion.  No back pain.  Psych: No change in mood or affect. No depression or anxiety.  No memory loss.   PHYSICAL EXAM: Blood pressure 99/52, pulse 112, temperature 97.8 F (36.6 C), temperature source Oral, resp. rate 12, height 5' 10.08" (1.78 m), weight 100.472 kg (221 lb 8 oz), SpO2 95.00%.  General appearance :Awake, alert, not in any distress. He looks acutely sick, has scleral icterus. Speech Clear- but slow HEENT: Atraumatic and Normocephalic, pupils equally reactive to light and accomodation Neck: supple, no JVD. No cervical lymphadenopathy.  Chest: Good air entry bilaterally-bibasilar rales CVS: S1 S2 regular- slightly tachycardic, no murmurs.  Abdomen: Bowel sounds present, mildly distended- with dullness in the flanks. Shifting dullness positive. No RUQ  tenderness. No gaurding, rigidity or rebound. Extremities: B/L Lower Ext shows 3-4+ edema, both legs are warm to touch Neurology: Non focal- has significant generalized weakness-but is moving all 4 extremities. Skin:No Rash Wounds:N/A  LABS ON ADMISSION:   Recent Labs  10/31/13 1541  NA 123*  K 4.9  CL 85*  CO2 23  GLUCOSE 79  BUN 25*  CREATININE 1.30  CALCIUM 7.9*    Recent Labs  10/31/13 1541  AST 118*  ALT 49  ALKPHOS 111  BILITOT 4.2*  PROT 5.7*  ALBUMIN 2.1*    Recent Labs  10/31/13 1541  LIPASE 30    Recent Labs  10/31/13 1541  WBC 19.7*  NEUTROABS 18.1*  HGB 9.5*  HCT 27.7*  MCV 93.6  PLT 106*    Recent Labs  10/31/13 1541 10/31/13 1839  TROPONINI 0.82* 0.45*   No results found for this basename: DDIMER,  in the last 72 hours No components found with this basename: POCBNP,    RADIOLOGIC STUDIES ON ADMISSION: Dg Ribs Unilateral W/chest Left  10/31/2013   CLINICAL DATA:  Weakness bilateral legs  for 3 days, initial evaluation, well left anterior rib pain after a fall yesterday, initial evaluation  EXAM: LEFT RIBS AND CHEST - 3+ VIEW  COMPARISON:  None.  FINDINGS: Minimally displaced fracture lateral left eighth rib. No pneumothorax or effusion. Stable cardiac enlargement of mild to moderate severity. Mild vascular congestion with no edema or consolidation.  IMPRESSION: Left rib fracture   Electronically Signed   By: Skipper Cliche M.D.   On: 10/31/2013 16:58   Dg Thoracic Spine 2 View  10/31/2013   CLINICAL DATA:  Bilateral leg weakness for the past 2 days. History of lumbar and thoracic spine fractures 2 years ago.  EXAM: THORACIC SPINE - 2 VIEW  COMPARISON:  10/27/2013.  FINDINGS: Stable multilevel degenerative changes including changes of DISH. No fractures or subluxations are seen.  IMPRESSION: Stable degenerative changes.  No fracture or subluxation.   Electronically Signed   By: Enrique Sack M.D.   On: 10/31/2013 17:02   Dg Lumbar Spine  Complete  10/31/2013   CLINICAL DATA:  Bilateral leg weakness for the past 2 days. History of lumbar and thoracic spine fractures 2 years ago.  EXAM: LUMBAR SPINE - COMPLETE 4+ VIEW  COMPARISON:  10/27/2013.  FINDINGS: Five non-rib-bearing lumbar vertebrae. Previously described L1 and L4 compression fractures are not changed significantly when differences in projection are taken into account. No acute fractures or subluxations. Anterior spur formation at multiple levels.  IMPRESSION: Stable L1 and L4 compression fractures.   Electronically Signed   By: Enrique Sack M.D.   On: 10/31/2013 17:01   Ct Head Wo Contrast  10/31/2013   CLINICAL DATA:  Weakness, status post fall yesterday  EXAM: CT HEAD WITHOUT CONTRAST  CT CERVICAL SPINE WITHOUT CONTRAST  TECHNIQUE: Multidetector CT imaging of the head and cervical spine was performed following the standard protocol without intravenous contrast. Multiplanar CT image reconstructions of the cervical spine were also generated.  COMPARISON:  None.  FINDINGS: CT HEAD FINDINGS  There is no evidence of mass effect, midline shift, or extra-axial fluid collections. There is no evidence of a space-occupying lesion or intracranial hemorrhage. There is no evidence of a cortical-based area of acute infarction. There is generalized cerebral atrophy.  The ventricles and sulci are appropriate for the patient's age. The basal cisterns are patent.  Visualized portions of the orbits are unremarkable. The visualized portions of the paranasal sinuses and mastoid air cells are unremarkable.  The osseous structures are unremarkable.  CT CERVICAL SPINE FINDINGS  The alignment is anatomic. The vertebral body heights are maintained. There is incomplete fusion of the posterior arch of C1, likely developmental. There is no acute fracture. There is no static listhesis. The prevertebral soft tissues are normal. The intraspinal soft tissues are not fully imaged on this examination due to poor soft  tissue contrast, but there is no gross soft tissue abnormality.  There is degenerative disc disease at C5-6 and C6-7 with mild disc space narrowing. There is a mild broad-based disc bulge at C3-4. There is a mild broad-based disc osteophyte complex at C4-5 and C5-6. There is bilateral facet arthropathy at C3-4. There is bilateral facet arthropathy at C4-5 and bilateral uncovertebral degenerative changes with left foraminal narrowing. Bilateral uncovertebral degenerative changes at C5-6 and C6-7 with bilateral foraminal stenosis.  There is a right apical bulla.There is right apical pleural thickening versus a small effusion.  There is left carotid artery atherosclerosis.  IMPRESSION: 1. No acute intracranial pathology. 2. No acute osseous injury of the  cervical spine.   Electronically Signed   By: Kathreen Devoid   On: 10/31/2013 16:18   Ct Angio Chest Pe W/cm &/or Wo Cm  10/31/2013   CLINICAL DATA:  68 year old male with acute hypoxia and tachycardia. Jaundice. GI bleeding and acute blood loss anemia. Initial encounter.  EXAM: CT ANGIOGRAPHY CHEST WITH CONTRAST  TECHNIQUE: Multidetector CT imaging of the chest was performed using the standard protocol during bolus administration of intravenous contrast. Multiplanar CT image reconstructions and MIPs were obtained to evaluate the vascular anatomy.  CONTRAST:  167mL OMNIPAQUE IOHEXOL 350 MG/ML SOLN  COMPARISON:  Chest radiographs 1614 hr the same day and earlier.  FINDINGS: Despite two contrast injections, there is suboptimal contrast bolus timing in the pulmonary arterial tree. Superimposed respiratory motion artifact. No central pulmonary embolus. No hilar pulmonary artery filling defect identified. Enlargement of central pulmonary arteries suggesting pulmonary artery hypertension. No definite more distal pulmonary embolus.  Synechiae in the trachea and mainstem bronchi. Small layering bilateral pleural effusions. Centrilobular emphysema with superimposed  ground-glass and reticulonodular multifocal pulmonary opacity in both upper lobes, slightly more extensive on the left. Mild compressive atelectasis in the lung bases.  No pericardial effusion. Distended azygos vein. No mediastinal or hilar lymphadenopathy. No axillary lymphadenopathy.  Gynecomastia.  Nodular cirrhotic liver partially visible. Small volume ascites in the upper abdomen. The stomach appears thick walled. Small mesenteric varices are visible.  No acute osseous abnormality identified.  Review of the MIP images confirms the above findings.  IMPRESSION: 1. Suboptimal study for detection of pulmonary embolus despite two separate contrast bolus attempts. No acute pulmonary embolus identified. 2. Centrilobular emphysema with multifocal bilateral ground-glass and reticulonodular opacity most compatible with acute bilateral pneumonia. 3. Cirrhosis partially visible. Small volume ascites visible. Small bilateral pleural effusions.   Electronically Signed   By: Lars Pinks M.D.   On: 10/31/2013 20:38   Ct Cervical Spine Wo Contrast  10/31/2013   CLINICAL DATA:  Weakness, status post fall yesterday  EXAM: CT HEAD WITHOUT CONTRAST  CT CERVICAL SPINE WITHOUT CONTRAST  TECHNIQUE: Multidetector CT imaging of the head and cervical spine was performed following the standard protocol without intravenous contrast. Multiplanar CT image reconstructions of the cervical spine were also generated.  COMPARISON:  None.  FINDINGS: CT HEAD FINDINGS  There is no evidence of mass effect, midline shift, or extra-axial fluid collections. There is no evidence of a space-occupying lesion or intracranial hemorrhage. There is no evidence of a cortical-based area of acute infarction. There is generalized cerebral atrophy.  The ventricles and sulci are appropriate for the patient's age. The basal cisterns are patent.  Visualized portions of the orbits are unremarkable. The visualized portions of the paranasal sinuses and mastoid air  cells are unremarkable.  The osseous structures are unremarkable.  CT CERVICAL SPINE FINDINGS  The alignment is anatomic. The vertebral body heights are maintained. There is incomplete fusion of the posterior arch of C1, likely developmental. There is no acute fracture. There is no static listhesis. The prevertebral soft tissues are normal. The intraspinal soft tissues are not fully imaged on this examination due to poor soft tissue contrast, but there is no gross soft tissue abnormality.  There is degenerative disc disease at C5-6 and C6-7 with mild disc space narrowing. There is a mild broad-based disc bulge at C3-4. There is a mild broad-based disc osteophyte complex at C4-5 and C5-6. There is bilateral facet arthropathy at C3-4. There is bilateral facet arthropathy at C4-5 and bilateral uncovertebral degenerative  changes with left foraminal narrowing. Bilateral uncovertebral degenerative changes at C5-6 and C6-7 with bilateral foraminal stenosis.  There is a right apical bulla.There is right apical pleural thickening versus a small effusion.  There is left carotid artery atherosclerosis.  IMPRESSION: 1. No acute intracranial pathology. 2. No acute osseous injury of the cervical spine.   Electronically Signed   By: Kathreen Devoid   On: 10/31/2013 16:18     EKG: Independently reviewed. Sinus tachycardia, poor R-wave progression  ASSESSMENT AND PLAN: Present on Admission:  . Severe Sepsis: Likely secondary to pneumonia, however with decompensated liver cirrhosis and abdominal distention, cannot rule out SBP at this time. Await blood culture results, for now empirically cover with vancomycin, Primaxin and ciprofloxacin. Cautiously hydrate and hold all diuretics for now. Narrow antibiotics depending on clinical condition, cultures. . Decompensated Liver cirrhosis with jaundice, coagulopathy and suspected ascites: Significantly elevated total bilirubin level, await direct bilirubin. Given soft BP, sepsis, hold  all diuretics for now.Check Abd USG in am, if confirms ascites, will need paracentesis to rule out SBP . PNA (pneumonia): Seen on CT chest, will be on the above noted Abx.Follow cultures, and narrow Abx accordingly. . Ascites:Given soft BP, sepsis, hold all diuretics for now.Check Abd USG in am, if confirms ascites, will need paracentesis. . Elevated troponin: Chest pain or shortness of breath. Likely troponin leak from demand ischemia in the setting of severe sepsis. Check Echo . Hyponatremia: Secondary to sepsis/dehydration, diuretic use. Hold diuretics, cautiously hydrate. Recheck electrolytes in a.m.  Marland Kitchen Respiratory failure with hypoxia: Secondary to pneumonia, suspect has COPD as well. Continue with oxygen via nasal cannula. CT angiogram chest although poor study, negative for central/large pulmonary embolism . Anemia: Likely secondary to chronic disease, continue to follow CBC. . Thrombocytopenia: Secondary to liver cirrhosis, follow CBC  . Rib fracture: Secondary to fall, likely contributing to pneumonia. Incentive spirometry, as needed nebulizers, supportive care.  Further plan will depend as patient's clinical course evolves and further radiologic and laboratory data become available. Patient will be monitored closely.  Above noted plan was discussed with patient/spouse/Daughter, they were in agreement.   DVT Prophylaxis: Prophylactic  Heparin  Code Status: Full Code  Disposition Plan: SNF vs HHPT    Total time spent for admission equals 45 minutes.  [facility] Pager [phone]  If 7PM-7AM, please contact night-coverage [email or web address] Password [email or web address] 10/31/2013, 11:52 PM

## 2013-10-31 NOTE — ED Notes (Addendum)
EKG given to EDP,Pollina,MD., for review. 

## 2013-10-31 NOTE — Progress Notes (Signed)
ANTIBIOTIC CONSULT NOTE - INITIAL  Pharmacy Consult for Vancomycin Indication: Sepsis   Allergies  Allergen Reactions  . Penicillins Other (See Comments)    Rash hives, white spots.    Patient Measurements:   Adjusted Body Weight: n/a  Vital Signs: Temp: 98.1 F (36.7 C) (10/18 1934) Temp Source: Oral (10/18 1934) BP: 97/46 mmHg (10/18 1934) Pulse Rate: 110 (10/18 1934) Intake/Output from previous day:   Intake/Output from this shift:    Labs:  Recent Labs  10/31/13 1541  WBC 19.7*  HGB 9.5*  PLT 106*  CREATININE 1.30   The CrCl is unknown because both a height and weight (above a minimum accepted value) are required for this calculation. No results found for this basename: VANCOTROUGH, VANCOPEAK, VANCORANDOM, GENTTROUGH, GENTPEAK, GENTRANDOM, TOBRATROUGH, TOBRAPEAK, TOBRARND, AMIKACINPEAK, AMIKACINTROU, AMIKACIN,  in the last 72 hours   Microbiology: No results found for this or any previous visit (from the past 720 hour(s)).  Medical History: Past Medical History  Diagnosis Date  . Stomach ulcer   . GI bleed   . Liver disorder   . Cirrhosis   . GERD (gastroesophageal reflux disease)     Medications:   (Not in a hospital admission) Assessment: 36 YOM presented with a progressively worsening weakness. Associated symptoms include abdominal distention and slowly worsening leg swelling. Pharmacy consulted to start Vancomycin for Sepsis. A dose for Vancomycin 1 gm IV x 1 has been ordered by the EDP. WBC is elevated at 19.7. Pt is afebrile. LA elevated at 3.44. CrCl ~ 60 mL/min.   10/18 Blood Cx x2>> 10/18 Urine Cx >>   Goal of Therapy:  Vancomycin trough level 15-20 mcg/ml  Plan:  1) Start Vancomycin 750 mg Q 12 hours  2) Monitor CBC, renal fx, cultures and patient's clinical progress 3) VT at [facility]. May adjust trough goal level as source of infection is identified   Albertina Parr, PharmD.  Clinical Pharmacist Pager [phone]

## 2013-10-31 NOTE — ED Notes (Addendum)
Pt from home c/o weakness since Friday. Hx of liver disease. Pt appears jaundiced. Sats 89 with RA. Pt has left sided rib pain from a fall yesterday and a bruise to the forehead from two days ago.

## 2013-10-31 NOTE — ED Provider Notes (Signed)
CSN: [id number]     Arrival date & time 10/31/13  1435 History   First MD Initiated Contact with Patient 10/31/13 1506     Chief Complaint  Patient presents with  . Weakness     (Consider location/radiation/quality/duration/timing/severity/associated sxs/prior Treatment) The history is provided by the patient, medical records, a relative and a significant other. No language interpreter was used.    Clifford Cook is a 68 y.o. male  with a hx of liver cirrhosis, PUD, Upper GI bleed, GERD presents to the Emergency Department complaining of gradual, persistent, progressively worsening weakness onset 2 weeks ago, but significantly worsening 2 days ago.  Pt reports he fell yesterday striking his left ribs and 2 days ago striking his right arm and head.  Pt's wife and daughter are at bedside who reports that his legs "seemed to give out on him" both times.  They report Hx of L-spine compression fractures that were last imaged 1 week ago without acute changes, but he has had increased back pain since the falls.  Pt denies focal weakness, numbness or tingling.  He reports generalized body pain, but no focal pain.  He sees Dr. Watt Climes with GI.  Pt with Hx of GI bleed, but denies hematemesis, melena or hematochezia.  Associated symptoms include abd distension and slowly worsening leg swelling.  Daughter reports he takes dilaudid PO for his back pain and his PCP doubled his lasix dose 5 days ago due to the increasing swelling.  Patient and family deny confusion.  No aggravating or alleviating factors. Pt denies fever, chills, headache, neck pain, CP, N/V/D, syncope, dysuria, hematuria.      Past Medical History  Diagnosis Date  . Stomach ulcer   . GI bleed   . Liver disorder   . Cirrhosis   . GERD (gastroesophageal reflux disease)    Past Surgical History  Procedure Laterality Date  . Cataract extraction, bilateral    . Surgery for lazy eye      age 76  . Esophagogastroduodenoscopy N/A 04/16/2012     Procedure: ESOPHAGOGASTRODUODENOSCOPY (EGD);  Surgeon: Jeryl Columbia, MD;  Location: [facility];  Service: Endoscopy;  Laterality: N/A;  . Esophagogastroduodenoscopy N/A 04/17/2012    Procedure: ESOPHAGOGASTRODUODENOSCOPY (EGD);  Surgeon: Jeryl Columbia, MD;  Location: [facility];  Service: Endoscopy;  Laterality: N/A;  . Direct laryngoscopy N/A 07/13/2013    Procedure: DIRECT LARYNGOSCOPY WITH EXCISION TUMOR;  Surgeon: Rozetta Nunnery, MD;  Location: [facility];  Service: ENT;  Laterality: N/A;   No family history on file. History  Substance Use Topics  . Smoking status: Current Every Day Smoker -- 1.00 packs/day    Types: Cigarettes  . Smokeless tobacco: Never Used  . Alcohol Use: No     Comment: over i yr    Review of Systems  Constitutional: Positive for fatigue. Negative for fever, diaphoresis, appetite change and unexpected weight change.  HENT: Negative for mouth sores.   Eyes: Negative for visual disturbance.  Respiratory: Positive for shortness of breath. Negative for cough, chest tightness and wheezing.   Cardiovascular: Negative for chest pain.  Gastrointestinal: Negative for nausea, vomiting, abdominal pain, diarrhea and constipation.  Endocrine: Negative for polydipsia, polyphagia and polyuria.  Genitourinary: Negative for dysuria, urgency, frequency and hematuria.  Musculoskeletal: Positive for back pain (chronic) and myalgias (generalized). Negative for neck stiffness.  Skin: Negative for rash.  Allergic/Immunologic: Negative for immunocompromised state.  Neurological: Positive for weakness (generalized). Negative for syncope, light-headedness and headaches.  Hematological: Does not bruise/bleed easily.  Psychiatric/Behavioral: Negative for sleep disturbance. The patient is not nervous/anxious.       Allergies  Penicillins  Home Medications   Prior to Admission medications   Medication Sig Start Date End Date Taking? Authorizing Provider   alendronate (FOSAMAX) 70 MG tablet Take 70 mg by mouth once a week. Take with a full glass of water on an empty stomach.   Yes Historical Provider, MD  diphenhydrAMINE (BENADRYL) 25 mg capsule Take 25 mg by mouth at bedtime as needed for itching or sleep (itching & sleep).    Yes Historical Provider, MD  diphenhydramine-acetaminophen (TYLENOL PM) 25-500 MG TABS Take 1 tablet by mouth at bedtime as needed (pain).   Yes Historical Provider, MD  ferrous sulfate 325 (65 FE) MG tablet Take 325 mg by mouth daily with breakfast.   Yes Historical Provider, MD  furosemide (LASIX) 40 MG tablet Take 40 mg by mouth 2 (two) times daily.    Yes Historical Provider, MD  HYDROmorphone (DILAUDID) 2 MG tablet Take 2 mg by mouth every 4 (four) hours as needed for severe pain (pain).   Yes Historical Provider, MD  hydrOXYzine (ATARAX/VISTARIL) 25 MG tablet Take 25 mg by mouth every 6 (six) hours as needed for itching (itching).   Yes Historical Provider, MD  Magnesium 250 MG TABS Take 250 mg by mouth daily.   Yes Historical Provider, MD  potassium chloride (KLOR-CON) 20 MEQ packet Take 40 mEq by mouth daily.   Yes Historical Provider, MD  spironolactone (ALDACTONE) 50 MG tablet Take 100 mg by mouth daily.   Yes Historical Provider, MD  traMADol (ULTRAM) 50 MG tablet Take 50 mg by mouth every 6 (six) hours as needed for moderate pain or severe pain (pain).    Yes Historical Provider, MD  Vitamin D, Ergocalciferol, (DRISDOL) 50000 UNITS CAPS capsule Take 50,000 Units by mouth every 7 (seven) days.   Yes Historical Provider, MD   BP 96/48  Pulse 112  Temp(Src) 97.8 F (36.6 C) (Oral)  Resp 22  Ht 5' 10.08" (1.78 m)  Wt 221 lb 8 oz (100.472 kg)  BMI 31.71 kg/m2  SpO2 95% Physical Exam  Nursing note and vitals reviewed. Constitutional: He appears well-developed and well-nourished. No distress.  Awake, alert, nontoxic appearance  HENT:  Head: Normocephalic.  Mouth/Throat: Oropharynx is clear and moist. No  oropharyngeal exudate.  Contusion to the central forehead  Eyes: Conjunctivae are normal. No scleral icterus.  Neck: Normal range of motion. Neck supple.  Cardiovascular: Regular rhythm and intact distal pulses.  Tachycardia present.   Pulses:      Radial pulses are 2+ on the right side, and 2+ on the left side.  Pulmonary/Chest: No accessory muscle usage. Tachypnea noted. No respiratory distress. He has decreased breath sounds. He has no wheezes.  Equal chest expansion Diminished throughout, but no focal wheezes, rhonchi or rales Tachypena, hypoxia into the 70s on room air  Abdominal: Soft. Bowel sounds are normal. He exhibits distension and fluid wave. He exhibits no mass. There is no tenderness. There is no rebound, no guarding and no CVA tenderness.  Abd distension with mild fluid wave  Musculoskeletal: Normal range of motion. He exhibits edema.  3+ pitting edema from the distal forefoot to above the knees  Neurological: He is alert.  Speech is clear and goal oriented Moves extremities without ataxia  Skin: Skin is warm and dry. No rash noted. He is not diaphoretic. No erythema.  Large abrasion  to the right forearm Small abrasions and bruises noted to the arms and hand   Psychiatric: He has a normal mood and affect.    ED Course  Procedures (including critical care time) Labs Review Labs Reviewed  CBC WITH DIFFERENTIAL - Abnormal; Notable for the following:    WBC 19.7 (*)    RBC 2.96 (*)    Hemoglobin 9.5 (*)    HCT 27.7 (*)    RDW 17.9 (*)    Platelets 106 (*)    Neutrophils Relative % 92 (*)    Lymphocytes Relative 4 (*)    Neutro Abs 18.1 (*)    All other components within normal limits  COMPREHENSIVE METABOLIC PANEL - Abnormal; Notable for the following:    Sodium 123 (*)    Chloride 85 (*)    BUN 25 (*)    Calcium 7.9 (*)    Total Protein 5.7 (*)    Albumin 2.1 (*)    AST 118 (*)    Total Bilirubin 4.2 (*)    GFR calc non Af Amer 54 (*)    GFR calc Af Amer  63 (*)    All other components within normal limits  PROTIME-INR - Abnormal; Notable for the following:    Prothrombin Time 19.2 (*)    INR 1.60 (*)    All other components within normal limits  URINALYSIS, ROUTINE W REFLEX MICROSCOPIC - Abnormal; Notable for the following:    Color, Urine AMBER (*)    APPearance CLOUDY (*)    Bilirubin Urine SMALL (*)    All other components within normal limits  TROPONIN I - Abnormal; Notable for the following:    Troponin I 0.82 (*)    All other components within normal limits  AMMONIA - Abnormal; Notable for the following:    Ammonia 70 (*)    All other components within normal limits  BLOOD GAS, ARTERIAL - Abnormal; Notable for the following:    pCO2 arterial 34.8 (*)    pO2, Arterial 62.9 (*)    All other components within normal limits  TROPONIN I - Abnormal; Notable for the following:    Troponin I 0.45 (*)    All other components within normal limits  I-STAT CG4 LACTIC ACID, ED - Abnormal; Notable for the following:    Lactic Acid, Venous 3.44 (*)    All other components within normal limits  CULTURE, BLOOD (ROUTINE X 2)  CULTURE, BLOOD (ROUTINE X 2)  URINE CULTURE  LIPASE, BLOOD  FERRITIN  TRANSFERRIN  POC OCCULT BLOOD, ED    Imaging Review Dg Ribs Unilateral W/chest Left  10/31/2013   CLINICAL DATA:  Weakness bilateral legs for 3 days, initial evaluation, well left anterior rib pain after a fall yesterday, initial evaluation  EXAM: LEFT RIBS AND CHEST - 3+ VIEW  COMPARISON:  None.  FINDINGS: Minimally displaced fracture lateral left eighth rib. No pneumothorax or effusion. Stable cardiac enlargement of mild to moderate severity. Mild vascular congestion with no edema or consolidation.  IMPRESSION: Left rib fracture   Electronically Signed   By: Skipper Cliche M.D.   On: 10/31/2013 16:58   Dg Thoracic Spine 2 View  10/31/2013   CLINICAL DATA:  Bilateral leg weakness for the past 2 days. History of lumbar and thoracic spine  fractures 2 years ago.  EXAM: THORACIC SPINE - 2 VIEW  COMPARISON:  10/27/2013.  FINDINGS: Stable multilevel degenerative changes including changes of DISH. No fractures or subluxations are seen.  IMPRESSION: Stable  degenerative changes.  No fracture or subluxation.   Electronically Signed   By: Enrique Sack M.D.   On: 10/31/2013 17:02   Dg Lumbar Spine Complete  10/31/2013   CLINICAL DATA:  Bilateral leg weakness for the past 2 days. History of lumbar and thoracic spine fractures 2 years ago.  EXAM: LUMBAR SPINE - COMPLETE 4+ VIEW  COMPARISON:  10/27/2013.  FINDINGS: Five non-rib-bearing lumbar vertebrae. Previously described L1 and L4 compression fractures are not changed significantly when differences in projection are taken into account. No acute fractures or subluxations. Anterior spur formation at multiple levels.  IMPRESSION: Stable L1 and L4 compression fractures.   Electronically Signed   By: Enrique Sack M.D.   On: 10/31/2013 17:01   Ct Head Wo Contrast  10/31/2013   CLINICAL DATA:  Weakness, status post fall yesterday  EXAM: CT HEAD WITHOUT CONTRAST  CT CERVICAL SPINE WITHOUT CONTRAST  TECHNIQUE: Multidetector CT imaging of the head and cervical spine was performed following the standard protocol without intravenous contrast. Multiplanar CT image reconstructions of the cervical spine were also generated.  COMPARISON:  None.  FINDINGS: CT HEAD FINDINGS  There is no evidence of mass effect, midline shift, or extra-axial fluid collections. There is no evidence of a space-occupying lesion or intracranial hemorrhage. There is no evidence of a cortical-based area of acute infarction. There is generalized cerebral atrophy.  The ventricles and sulci are appropriate for the patient's age. The basal cisterns are patent.  Visualized portions of the orbits are unremarkable. The visualized portions of the paranasal sinuses and mastoid air cells are unremarkable.  The osseous structures are unremarkable.  CT  CERVICAL SPINE FINDINGS  The alignment is anatomic. The vertebral body heights are maintained. There is incomplete fusion of the posterior arch of C1, likely developmental. There is no acute fracture. There is no static listhesis. The prevertebral soft tissues are normal. The intraspinal soft tissues are not fully imaged on this examination due to poor soft tissue contrast, but there is no gross soft tissue abnormality.  There is degenerative disc disease at C5-6 and C6-7 with mild disc space narrowing. There is a mild broad-based disc bulge at C3-4. There is a mild broad-based disc osteophyte complex at C4-5 and C5-6. There is bilateral facet arthropathy at C3-4. There is bilateral facet arthropathy at C4-5 and bilateral uncovertebral degenerative changes with left foraminal narrowing. Bilateral uncovertebral degenerative changes at C5-6 and C6-7 with bilateral foraminal stenosis.  There is a right apical bulla.There is right apical pleural thickening versus a small effusion.  There is left carotid artery atherosclerosis.  IMPRESSION: 1. No acute intracranial pathology. 2. No acute osseous injury of the cervical spine.   Electronically Signed   By: Kathreen Devoid   On: 10/31/2013 16:18   Ct Angio Chest Pe W/cm &/or Wo Cm  10/31/2013   CLINICAL DATA:  68 year old male with acute hypoxia and tachycardia. Jaundice. GI bleeding and acute blood loss anemia. Initial encounter.  EXAM: CT ANGIOGRAPHY CHEST WITH CONTRAST  TECHNIQUE: Multidetector CT imaging of the chest was performed using the standard protocol during bolus administration of intravenous contrast. Multiplanar CT image reconstructions and MIPs were obtained to evaluate the vascular anatomy.  CONTRAST:  170mL OMNIPAQUE IOHEXOL 350 MG/ML SOLN  COMPARISON:  Chest radiographs 1614 hr the same day and earlier.  FINDINGS: Despite two contrast injections, there is suboptimal contrast bolus timing in the pulmonary arterial tree. Superimposed respiratory motion  artifact. No central pulmonary embolus. No hilar pulmonary artery  filling defect identified. Enlargement of central pulmonary arteries suggesting pulmonary artery hypertension. No definite more distal pulmonary embolus.  Synechiae in the trachea and mainstem bronchi. Small layering bilateral pleural effusions. Centrilobular emphysema with superimposed ground-glass and reticulonodular multifocal pulmonary opacity in both upper lobes, slightly more extensive on the left. Mild compressive atelectasis in the lung bases.  No pericardial effusion. Distended azygos vein. No mediastinal or hilar lymphadenopathy. No axillary lymphadenopathy.  Gynecomastia.  Nodular cirrhotic liver partially visible. Small volume ascites in the upper abdomen. The stomach appears thick walled. Small mesenteric varices are visible.  No acute osseous abnormality identified.  Review of the MIP images confirms the above findings.  IMPRESSION: 1. Suboptimal study for detection of pulmonary embolus despite two separate contrast bolus attempts. No acute pulmonary embolus identified. 2. Centrilobular emphysema with multifocal bilateral ground-glass and reticulonodular opacity most compatible with acute bilateral pneumonia. 3. Cirrhosis partially visible. Small volume ascites visible. Small bilateral pleural effusions.   Electronically Signed   By: Lars Pinks M.D.   On: 10/31/2013 20:38   Ct Cervical Spine Wo Contrast  10/31/2013   CLINICAL DATA:  Weakness, status post fall yesterday  EXAM: CT HEAD WITHOUT CONTRAST  CT CERVICAL SPINE WITHOUT CONTRAST  TECHNIQUE: Multidetector CT imaging of the head and cervical spine was performed following the standard protocol without intravenous contrast. Multiplanar CT image reconstructions of the cervical spine were also generated.  COMPARISON:  None.  FINDINGS: CT HEAD FINDINGS  There is no evidence of mass effect, midline shift, or extra-axial fluid collections. There is no evidence of a space-occupying  lesion or intracranial hemorrhage. There is no evidence of a cortical-based area of acute infarction. There is generalized cerebral atrophy.  The ventricles and sulci are appropriate for the patient's age. The basal cisterns are patent.  Visualized portions of the orbits are unremarkable. The visualized portions of the paranasal sinuses and mastoid air cells are unremarkable.  The osseous structures are unremarkable.  CT CERVICAL SPINE FINDINGS  The alignment is anatomic. The vertebral body heights are maintained. There is incomplete fusion of the posterior arch of C1, likely developmental. There is no acute fracture. There is no static listhesis. The prevertebral soft tissues are normal. The intraspinal soft tissues are not fully imaged on this examination due to poor soft tissue contrast, but there is no gross soft tissue abnormality.  There is degenerative disc disease at C5-6 and C6-7 with mild disc space narrowing. There is a mild broad-based disc bulge at C3-4. There is a mild broad-based disc osteophyte complex at C4-5 and C5-6. There is bilateral facet arthropathy at C3-4. There is bilateral facet arthropathy at C4-5 and bilateral uncovertebral degenerative changes with left foraminal narrowing. Bilateral uncovertebral degenerative changes at C5-6 and C6-7 with bilateral foraminal stenosis.  There is a right apical bulla.There is right apical pleural thickening versus a small effusion.  There is left carotid artery atherosclerosis.  IMPRESSION: 1. No acute intracranial pathology. 2. No acute osseous injury of the cervical spine.   Electronically Signed   By: Kathreen Devoid   On: 10/31/2013 16:18     EKG Interpretation   Date/Time:  Sunday October 31 2013 15:14:04 EDT Ventricular Rate:  110 PR Interval:    QRS Duration: 91 QT Interval:  342 QTC Calculation: 463 R Axis:   -66 Text Interpretation:  sinus tachycardia Left anterior fascicular block Low  voltage, extremity leads Probable anteroseptal  infarct, old Confirmed by  [facility]  MD, Stout [id number]) on 10/31/2013 3:57:26 PM  CRITICAL CARE Performed by: Abigail Butts Total critical care time: 1 hour Critical care time was exclusive of separately billable procedures and treating other patients. Critical care was necessary to treat or prevent imminent or life-threatening deterioration. Critical care was time spent personally by me on the following activities: development of treatment plan with patient and/or surrogate as well as nursing, discussions with consultants, evaluation of patient's response to treatment, examination of patient, obtaining history from patient or surrogate, ordering and performing treatments and interventions, ordering and review of laboratory studies, ordering and review of radiographic studies, pulse oximetry and re-evaluation of patient's condition.    MDM   Final diagnoses:  Sepsis, due to unspecified organism  Tachycardia  Cirrhosis of liver with ascites, unspecified hepatic cirrhosis type  Elevated lactic acid level  Tobacco abuse   Jenetta Downer presents with generalized weakness and hypoxia.  Pt is ill appearing.  Hypoxia resolved with supplemental oxygenation.  Fluid retention in abd and legs.  Pt with generalized myalgias and no focal weakness on neurologic exam.  Pt stable at this time; anticipate admission.    4:57 PM Pt with elevated ammonia, though less than previously.  Pt also with leukocytosis and elevated troponin.  ECG nonischemic and without a-fib/a-flutter.   Pt denies CP, nausea.  Pt with anemia from last visit, 9.5 today.  Hyponatremia to 123.    7:18 PM Pt abdomen remained soft and nontender. Doubt SBP.  Ultrasound without large pocket of fluid for which diagnostic paracentesis would be appropriate.    8:05PM Patient discussed with Dr. Kennith Center of cardiology. He agrees to elevated troponin is likely a troponin leak secondary to sepsis. He agrees with E. rule out a recommends  holding on blood thinners at this time and trending elevated troponins. He reports that if troponins began to trend up patient develops chest pain or EKG changes they will be happy to reconsult.  8:56 PM CT anterior chest without obvious pulmonary embolism. CT does confirm bilateral pneumonia likely the source of patient's sepsis.  Patient recently had hospital ENT procedure, but no hospitalization.    Pt with persistently soft BPs, will give fluid bolus.    11:17 PM Discussed with Dr. Sloan Leiter who will admit to stepdown.  Pt remains tachycardic but with stable SBPs  BP 96/48  Pulse 112  Temp(Src) 97.8 F (36.6 C) (Oral)  Resp 22  Ht 5' 10.08" (1.78 m)  Wt 221 lb 8 oz (100.472 kg)  BMI 31.71 kg/m2  SpO2 95%  The patient was discussed with and seen by Dr. Tawnya Crook who agrees with the treatment plan.    Jarrett Soho Candence Sease, PA-C 10/31/13 2318

## 2013-10-31 NOTE — ED Notes (Signed)
Patient transported to X-ray 

## 2013-10-31 NOTE — ED Notes (Signed)
Called unit to give report. Nurse wanted to call back.

## 2013-10-31 NOTE — ED Notes (Signed)
Patient aware we need urine specimen 

## 2013-11-01 ENCOUNTER — Inpatient Hospital Stay (HOSPITAL_COMMUNITY): Payer: Medicare Other

## 2013-11-01 DIAGNOSIS — I517 Cardiomegaly: Secondary | ICD-10-CM

## 2013-11-01 LAB — CBC
HEMATOCRIT: 23.7 % — AB (ref 39.0–52.0)
HEMOGLOBIN: 8.5 g/dL — AB (ref 13.0–17.0)
MCH: 33.5 pg (ref 26.0–34.0)
MCHC: 35.9 g/dL (ref 30.0–36.0)
MCV: 93.3 fL (ref 78.0–100.0)
Platelets: 88 10*3/uL — ABNORMAL LOW (ref 150–400)
RBC: 2.54 MIL/uL — ABNORMAL LOW (ref 4.22–5.81)
RDW: 18.1 % — ABNORMAL HIGH (ref 11.5–15.5)
WBC: 16.6 10*3/uL — AB (ref 4.0–10.5)

## 2013-11-01 LAB — PROTIME-INR
INR: 1.8 — AB (ref 0.00–1.49)
Prothrombin Time: 21 seconds — ABNORMAL HIGH (ref 11.6–15.2)

## 2013-11-01 LAB — CBC WITH DIFFERENTIAL/PLATELET
Basophils Absolute: 0 10*3/uL (ref 0.0–0.1)
Basophils Relative: 0 % (ref 0–1)
Eosinophils Absolute: 0.3 10*3/uL (ref 0.0–0.7)
Eosinophils Relative: 2 % (ref 0–5)
HCT: 24.1 % — ABNORMAL LOW (ref 39.0–52.0)
HEMOGLOBIN: 8.5 g/dL — AB (ref 13.0–17.0)
LYMPHS ABS: 0.8 10*3/uL (ref 0.7–4.0)
LYMPHS PCT: 5 % — AB (ref 12–46)
MCH: 32.2 pg (ref 26.0–34.0)
MCHC: 35.3 g/dL (ref 30.0–36.0)
MCV: 91.3 fL (ref 78.0–100.0)
MONOS PCT: 3 % (ref 3–12)
Monocytes Absolute: 0.6 10*3/uL (ref 0.1–1.0)
NEUTROS ABS: 15 10*3/uL — AB (ref 1.7–7.7)
NEUTROS PCT: 90 % — AB (ref 43–77)
Platelets: 90 10*3/uL — ABNORMAL LOW (ref 150–400)
RBC: 2.64 MIL/uL — AB (ref 4.22–5.81)
RDW: 18.1 % — ABNORMAL HIGH (ref 11.5–15.5)
WBC: 16.6 10*3/uL — ABNORMAL HIGH (ref 4.0–10.5)

## 2013-11-01 LAB — HEPATIC FUNCTION PANEL
ALBUMIN: 1.8 g/dL — AB (ref 3.5–5.2)
ALK PHOS: 89 U/L (ref 39–117)
ALT: 43 U/L (ref 0–53)
AST: 102 U/L — AB (ref 0–37)
Bilirubin, Direct: 2 mg/dL — ABNORMAL HIGH (ref 0.0–0.3)
Indirect Bilirubin: 1.4 mg/dL — ABNORMAL HIGH (ref 0.3–0.9)
Total Bilirubin: 3.4 mg/dL — ABNORMAL HIGH (ref 0.3–1.2)
Total Protein: 4.8 g/dL — ABNORMAL LOW (ref 6.0–8.3)

## 2013-11-01 LAB — CREATININE, SERUM
Creatinine, Ser: 1.4 mg/dL — ABNORMAL HIGH (ref 0.50–1.35)
GFR calc non Af Amer: 50 mL/min — ABNORMAL LOW (ref >90)
GFR, EST AFRICAN AMERICAN: 58 mL/min — AB (ref >90)

## 2013-11-01 LAB — BASIC METABOLIC PANEL
Anion gap: 12 (ref 5–15)
BUN: 27 mg/dL — ABNORMAL HIGH (ref 6–23)
CHLORIDE: 90 meq/L — AB (ref 96–112)
CO2: 24 mEq/L (ref 19–32)
Calcium: 7.3 mg/dL — ABNORMAL LOW (ref 8.4–10.5)
Creatinine, Ser: 1.44 mg/dL — ABNORMAL HIGH (ref 0.50–1.35)
GFR calc Af Amer: 56 mL/min — ABNORMAL LOW (ref >90)
GFR calc non Af Amer: 48 mL/min — ABNORMAL LOW (ref >90)
GLUCOSE: 84 mg/dL (ref 70–99)
Potassium: 4.9 mEq/L (ref 3.7–5.3)
Sodium: 126 mEq/L — ABNORMAL LOW (ref 137–147)

## 2013-11-01 LAB — BILIRUBIN, DIRECT: BILIRUBIN DIRECT: 2 mg/dL — AB (ref 0.0–0.3)

## 2013-11-01 LAB — LACTIC ACID, PLASMA: LACTIC ACID, VENOUS: 1.9 mmol/L (ref 0.5–2.2)

## 2013-11-01 LAB — TROPONIN I
TROPONIN I: 0.6 ng/mL — AB (ref <0.30)
Troponin I: 0.88 ng/mL (ref <0.30)
Troponin I: 0.72 ng/mL (ref <0.30)

## 2013-11-01 LAB — TRANSFERRIN: Transferrin: 145 mg/dL — ABNORMAL LOW (ref 200–360)

## 2013-11-01 LAB — MRSA PCR SCREENING: MRSA BY PCR: NEGATIVE

## 2013-11-01 MED ORDER — CETYLPYRIDINIUM CHLORIDE 0.05 % MT LIQD
7.0000 mL | Freq: Two times a day (BID) | OROMUCOSAL | Status: DC
  Administered 2013-11-01 – 2013-11-09 (×14): 7 mL via OROMUCOSAL

## 2013-11-01 MED ORDER — MORPHINE SULFATE 2 MG/ML IJ SOLN: 1.0000 mg | INTRAMUSCULAR | Status: DC | PRN

## 2013-11-01 MED ORDER — ONDANSETRON HCL 4 MG/2ML IJ SOLN: 4.0000 mg | Freq: Four times a day (QID) | INTRAMUSCULAR | Status: DC | PRN

## 2013-11-01 MED ORDER — HEPARIN SODIUM (PORCINE) 5000 UNIT/ML IJ SOLN
5000.0000 [IU] | Freq: Three times a day (TID) | INTRAMUSCULAR | Status: DC
  Administered 2013-11-01 (×2): 5000 [IU] via SUBCUTANEOUS
  Filled 2013-11-01 (×2): qty 1

## 2013-11-01 MED ORDER — SODIUM CHLORIDE 0.9 % IJ SOLN
3.0000 mL | Freq: Two times a day (BID) | INTRAMUSCULAR | Status: DC
  Administered 2013-11-01 – 2013-11-07 (×10): 3 mL via INTRAVENOUS

## 2013-11-01 MED ORDER — ALBUTEROL SULFATE (2.5 MG/3ML) 0.083% IN NEBU
2.5000 mg | INHALATION_SOLUTION | RESPIRATORY_TRACT | Status: DC | PRN
  Administered 2013-11-04: 2.5 mg via RESPIRATORY_TRACT
  Filled 2013-11-01: qty 3

## 2013-11-01 MED ORDER — ONDANSETRON HCL 4 MG PO TABS: 4.0000 mg | ORAL_TABLET | Freq: Four times a day (QID) | ORAL | Status: DC | PRN

## 2013-11-01 MED ORDER — CHLORHEXIDINE GLUCONATE 0.12 % MT SOLN
15.0000 mL | Freq: Two times a day (BID) | OROMUCOSAL | Status: DC
  Administered 2013-11-01 – 2013-11-09 (×18): 15 mL via OROMUCOSAL
  Filled 2013-11-01 (×21): qty 15

## 2013-11-01 MED ORDER — SODIUM CHLORIDE 0.9 % IV SOLN
INTRAVENOUS | Status: DC
  Administered 2013-11-01: 1000 mL via INTRAVENOUS
  Administered 2013-11-01 – 2013-11-04 (×3): via INTRAVENOUS

## 2013-11-01 MED ORDER — OXYCODONE HCL 5 MG PO TABS: 5.0000 mg | ORAL_TABLET | ORAL | Status: DC | PRN

## 2013-11-01 MED ORDER — KETOROLAC TROMETHAMINE 15 MG/ML IJ SOLN: 15.0000 mg | Freq: Four times a day (QID) | INTRAMUSCULAR | Status: DC | PRN

## 2013-11-01 MED ORDER — GUAIFENESIN-DM 100-10 MG/5ML PO SYRP: 5.0000 mL | ORAL_SOLUTION | ORAL | Status: DC | PRN

## 2013-11-01 MED ORDER — PANTOPRAZOLE SODIUM 40 MG IV SOLR
40.0000 mg | INTRAVENOUS | Status: DC
Start: 2013-11-01 — End: 2013-11-04
  Administered 2013-11-01 – 2013-11-03 (×3): 40 mg via INTRAVENOUS
  Filled 2013-11-01 (×3): qty 40

## 2013-11-01 MED ORDER — CIPROFLOXACIN IN D5W 400 MG/200ML IV SOLN
400.0000 mg | Freq: Two times a day (BID) | INTRAVENOUS | Status: DC
  Administered 2013-11-01 (×2): 400 mg via INTRAVENOUS
  Filled 2013-11-01 (×2): qty 200

## 2013-11-01 MED ORDER — SODIUM CHLORIDE 0.9 % IV SOLN
500.0000 mg | Freq: Three times a day (TID) | INTRAVENOUS | Status: DC
  Administered 2013-11-01 – 2013-11-02 (×4): 500 mg via INTRAVENOUS
  Filled 2013-11-01 (×6): qty 500

## 2013-11-01 NOTE — Progress Notes (Signed)
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 CT chest neg for PE, suggest PNA, Labs suggesting AKI, hyponatremia Found on admission to have increasing abd swelling, demand ischemia, suspected PNA vs SBP  Past medical history-As per Problem list Chart reviewed as below- reviewed  Consultants:  None yet  Procedures:  Ct scan spine  Ct head  Ct angio chest  Antibiotics:  Primaxin 10/18  Vancomycin 10/18  Ciprofloxacin 10/18   Subjective  Alert pleasant oriented No fever no chills no chest pain no nausea no dark stool no tarry stool Still feels swelling of the abdomen Weakness is still there Feels he has gained about 5-6 kg  Objective    Interim History: Reviewed  Telemetry: Sinus tachycardia 120s   Objective: Filed Vitals:   11/01/13 0300 11/01/13 0400 11/01/13 0500 11/01/13 0600  BP: 87/30 72/34 77/26  80/46  Pulse: 103 107 101 97  Temp: 98.2 F (36.8 C) 98.1 F (36.7 C) 98.1 F (36.7 C) 97.9 F (36.6 C)  TempSrc:      Resp: 14 16 15 16   Height:      Weight:  101.3 kg (223 lb 5.2 oz)    SpO2: 93% 93% 92% 92%    Intake/Output Summary (Last 24 hours) at 11/01/13 0823 Last data filed at 11/01/13 0600  Gross per 24 hour  Intake 676.67 ml  Output    250 ml  Net 426.67 ml    Exam:  General: Alert oriented no apparent distress Cardiovascular: S1-S2 tachycardic Respiratory: Clinically clear no added sounds Abdomen: Soft distended, anasarca Skin lower extremity edema noted Neuro intact  Data Reviewed: Basic Metabolic Panel:  Recent Labs Lab 10/31/13 1541 11/01/13 0050 11/01/13 0629  NA 123*  --  126*  K 4.9  --  4.9  CL 85*  --  90*  CO2 23  --  24  GLUCOSE 79  --  84  BUN 25*  --  27*  CREATININE 1.30 1.40* 1.44*  CALCIUM  7.9*  --  7.3*   Liver Function Tests:  Recent Labs Lab 10/31/13 1541 11/01/13 0629  AST 118* 102*  ALT 49 43  ALKPHOS 111 89  BILITOT 4.2* 3.4*  PROT 5.7* 4.8*  ALBUMIN 2.1* 1.8*    Recent Labs Lab 10/31/13 1541  LIPASE 30    Recent Labs Lab 10/31/13 1544  AMMONIA 70*   CBC:  Recent Labs Lab 10/31/13 1541 11/01/13 0050 11/01/13 0629  WBC 19.7* 16.6* 16.6*  NEUTROABS 18.1*  --  15.0*  HGB 9.5* 8.5* 8.5*  HCT 27.7* 23.7* 24.1*  MCV 93.6 93.3 91.3  PLT 106* 88* 90*   Cardiac Enzymes:  Recent Labs Lab 10/31/13 1541 10/31/13 1839 11/01/13 0050 11/01/13 0629  TROPONINI 0.82* 0.45* 0.88* 0.72*   BNP: No components found with this basename: POCBNP,  CBG: No results found for this basename: GLUCAP,  in the last 168 hours  Recent Results (from the past 240 hour(s))  MRSA PCR SCREENING     Status: None   Collection Time    11/01/13 12:06 AM      Result Value Ref Range Status   MRSA by PCR NEGATIVE  NEGATIVE Final   Comment:            The GeneXpert MRSA Assay (FDA     approved for  NASAL specimens     only), is one component of a     comprehensive MRSA colonization     surveillance program. It is not     intended to diagnose MRSA     infection nor to guide or     monitor treatment for     MRSA infections.     Studies:              All Imaging reviewed and is as per above notation   Scheduled Meds: . antiseptic oral rinse  7 mL Mouth Rinse q12n4p  . chlorhexidine  15 mL Mouth Rinse BID  . ciprofloxacin  400 mg Intravenous Q12H  . heparin  5,000 Units Subcutaneous 3 times per day  . imipenem-cilastatin  500 mg Intravenous Q8H  . sodium chloride  3 mL Intravenous Q12H  . vancomycin  750 mg Intravenous Q12H   Continuous Infusions: . sodium chloride 1,000 mL (11/01/13 0014)     Assessment/Plan:  1. Severe sepsis-pneumonia vs. SBP. Continue Primaxin, vancomycin, ciprofloxacin. Keep IV fluids going. He has received a total of 2.8 L so far  this admission. If he still remains hypotensive we may need critical care to see him.  Nursing to notify if MAP below 55 2. Constitutional hypotension-usual blood pressure lower than 100/50 per family 3. Demand ischemia-troponins stable and seem to be trending downward. Await echocardiogram 4. SBP?-Get paracentesis and ultrasound this morning. Obtain cell count, Gram stain, fluid protein and a calculated SAAG-this is likely secondary to portal hypertension. 5. Liver disease-Family mentions h/o Hemachromatosis-Will obtain Ferritin-His GI Md is Dr. Watt Climes and we will consult [facility] if needed.  For now hold off A;dactone 50 and laxix 40 given hypotension 6. Possible PNA-Will obtain CXR once Paracentesis is obtained. 7. Weakness-likely 2/2 to multiple issues-PT to evaluate once procedures completed 8. Rib Fracture-continue Toradol. Discontinued opiates and Tylenol 9. Hyponatremia, hypervolemic-2/2 to third spacing from Ascties.  Will need to re-implement diuretics when able.  Patient's diuretics were increased by primary care physician to double the dose earlier last week and that is when he started having falls and weakness 10. 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 Stable currently. Repeat labs in a.m. 11. Prior GI bleed-discontinue heparin and start SCDs for prophylaxis. IV Protonix 40 mg daily  Code Status: Full Family Communication: Discussed with family members at bedside all questions answered Disposition Plan: Keep on step down overnight   Verneita Griffes, MD  [facility] Pager [phone] 11/01/2013, 8:23 AM    LOS: 1 day

## 2013-11-01 NOTE — Care Management Note (Signed)
    Page 1 of 2   11/01/2013     5:08:19 PM CARE MANAGEMENT NOTE 11/01/2013  Patient:  Clifford Cook,Clifford Cook   Account Number:  [id number]  Date Initiated:  11/01/2013  Documentation initiated by:  Michaelangelo Mittelman  Subjective/Objective Assessment:   sepsis and hypotensive episode poss cardiac involvement due to elevated troponin levels and ekg showing poss old ant. infarct`     Action/Plan:   snf versus home with hhc   Anticipated DC Date:  11/04/2013   Anticipated DC Plan:  SKILLED NURSING FACILITY  In-house referral  Clinical Social Worker      DC Planning Services  CM consult      [facility] Choice  NA   Choice offered to / List presented to:  NA   DME arranged  NA      DME agency  NA     [place] arranged  NA      [place] agency  NA   Status of service:  In process, will continue to follow Medicare Important Message given?  NA - LOS <3 / Initial given by admissions (If response is "NO", the following Medicare IM given date fields will be blank) Date Medicare IM given:   Medicare IM given by:   Date Additional Medicare IM given:   Additional Medicare IM given by:    Discharge Disposition:    Per UR Regulation:  Reviewed for med. necessity/level of care/duration of stay  If discussed at [place] of Stay Meetings, dates discussed:    Comments:  [id number]/Jerold Yoss Rosana Hoes, RN, BSN, CCM Chart reviewed. Discharge needs and patient's stay to be reviewed and followed by case manager.

## 2013-11-01 NOTE — Progress Notes (Signed)
  Echocardiogram 2D Echocardiogram has been performed.  Clifford Cook 11/01/2013, 12:47 PM

## 2013-11-01 NOTE — Progress Notes (Signed)
CRITICAL VALUE ALERT  Critical value received: Troponin 0.88  Date of notification:  10/31/13  Time of notification:  0177  Critical value read back:Yes.    Nurse who received alert:  Jari Favre RN  MD notified (1st page):  Dr Clydene Laming  Time of first page:  2353  MD notified (2nd page):  Time of second page:  Responding MD:  Dr Clydene Laming  Time MD responded:  [phone]

## 2013-11-01 NOTE — Progress Notes (Signed)
Patient ID: Clifford Cook, male   DOB: [birth year], 68 y.o.   MRN: [id number] Pt presented to [place] dept today for paracentesis. Limited US abd in all four quadrants reveals trace ascites, not safely amenable to tap today. Procedure was cancelled and Dr. Verlon Au notified. Pt aware. Can re evaluate in few days to assess for increase in ascites if needed.

## 2013-11-01 NOTE — Progress Notes (Signed)
Antibiotic Consult note- initial   See full note written 10/18 for Vancomycin  Assessement:  Worsening weakness  Severe sepsis likely PNA, but with decompensated liver cirrhosis and abdominal distention.   MD empirically covering with Vancomycin/Primaxin and Cipro.  Plan:  Cipro 400mg  IV q12h  Primaxin 500mg  IV q8h  Dorrene German 11/01/2013 12:45 AM

## 2013-11-01 NOTE — Progress Notes (Signed)
PT Cancellation Note  Patient Details Name: Clifford Cook MRN: [id number] DOB: 02-22-1945   Cancelled Treatment:    Reason Eval/Treat Not Completed: Patient not medically ready  Pt with low BP (92/33), Troponin elevated but trending downward ( .72), to have paracentesis     and [place]  this today; will check on pt tomorrow as schedule permits; Kenyon Ana, PT Pager: [phone] 11/01/2013    Kenyon Ana 11/01/2013, 9:32 AM

## 2013-11-01 NOTE — Progress Notes (Signed)
Consulted with Dr. Philis Pique regarding low B/P, After reviewing the chart it was felt that B/P was being affected possibly by enlarged liver. No orders given to bolus. Pt has received 2250 NS bolus from the ED, currently is on NS 100 mL/hr.

## 2013-11-02 ENCOUNTER — Inpatient Hospital Stay (HOSPITAL_COMMUNITY): Payer: Medicare Other

## 2013-11-02 LAB — CBC WITH DIFFERENTIAL/PLATELET
Basophils Absolute: 0 10*3/uL (ref 0.0–0.1)
Basophils Relative: 0 % (ref 0–1)
EOS ABS: 0.6 10*3/uL (ref 0.0–0.7)
EOS PCT: 4 % (ref 0–5)
HCT: 23 % — ABNORMAL LOW (ref 39.0–52.0)
HEMOGLOBIN: 8.3 g/dL — AB (ref 13.0–17.0)
LYMPHS ABS: 0.9 10*3/uL (ref 0.7–4.0)
Lymphocytes Relative: 6 % — ABNORMAL LOW (ref 12–46)
MCH: 33.2 pg (ref 26.0–34.0)
MCHC: 36.1 g/dL — AB (ref 30.0–36.0)
MCV: 92 fL (ref 78.0–100.0)
MONOS PCT: 5 % (ref 3–12)
Monocytes Absolute: 0.8 10*3/uL (ref 0.1–1.0)
Neutro Abs: 13.2 10*3/uL — ABNORMAL HIGH (ref 1.7–7.7)
Neutrophils Relative %: 85 % — ABNORMAL HIGH (ref 43–77)
PLATELETS: 86 10*3/uL — AB (ref 150–400)
RBC: 2.5 MIL/uL — ABNORMAL LOW (ref 4.22–5.81)
RDW: 18.4 % — ABNORMAL HIGH (ref 11.5–15.5)
WBC: 15.5 10*3/uL — ABNORMAL HIGH (ref 4.0–10.5)

## 2013-11-02 LAB — COMPREHENSIVE METABOLIC PANEL
ALT: 42 U/L (ref 0–53)
AST: 81 U/L — ABNORMAL HIGH (ref 0–37)
Albumin: 1.8 g/dL — ABNORMAL LOW (ref 3.5–5.2)
Alkaline Phosphatase: 117 U/L (ref 39–117)
Anion gap: 13 (ref 5–15)
BUN: 39 mg/dL — AB (ref 6–23)
CALCIUM: 7.4 mg/dL — AB (ref 8.4–10.5)
CO2: 22 mEq/L (ref 19–32)
CREATININE: 2.23 mg/dL — AB (ref 0.50–1.35)
Chloride: 88 mEq/L — ABNORMAL LOW (ref 96–112)
GFR calc Af Amer: 33 mL/min — ABNORMAL LOW (ref >90)
GFR, EST NON AFRICAN AMERICAN: 28 mL/min — AB (ref >90)
GLUCOSE: 117 mg/dL — AB (ref 70–99)
Potassium: 4.9 mEq/L (ref 3.7–5.3)
Sodium: 123 mEq/L — ABNORMAL LOW (ref 137–147)
Total Bilirubin: 3.2 mg/dL — ABNORMAL HIGH (ref 0.3–1.2)
Total Protein: 5 g/dL — ABNORMAL LOW (ref 6.0–8.3)

## 2013-11-02 LAB — TROPONIN I
TROPONIN I: 0.37 ng/mL — AB (ref <0.30)
TROPONIN I: 0.31 ng/mL — AB (ref <0.30)
Troponin I: <0.3 ng/mL (ref <0.30)

## 2013-11-02 LAB — URINE CULTURE: Colony Count: 7000

## 2013-11-02 LAB — ALBUMIN, FLUID (OTHER): Albumin, Fluid: <0.2 g/dL

## 2013-11-02 LAB — BODY FLUID CELL COUNT WITH DIFFERENTIAL
Lymphs, Fluid: 3 %
MONOCYTE-MACROPHAGE-SEROUS FLUID: 25 % — AB (ref 50–90)
Neutrophil Count, Fluid: 72 % — ABNORMAL HIGH (ref 0–25)
Total Nucleated Cell Count, Fluid: 275 cu mm (ref 0–1000)

## 2013-11-02 LAB — PROTIME-INR
INR: 1.71 — ABNORMAL HIGH (ref 0.00–1.49)
Prothrombin Time: 20.2 seconds — ABNORMAL HIGH (ref 11.6–15.2)

## 2013-11-02 LAB — GRAM STAIN

## 2013-11-02 LAB — PATHOLOGIST SMEAR REVIEW

## 2013-11-02 LAB — PROTEIN, BODY FLUID: Total protein, fluid: 0.2 g/dL

## 2013-11-02 LAB — CREATININE, URINE, RANDOM: CREATININE, URINE: 144.01 mg/dL

## 2013-11-02 LAB — FERRITIN: Ferritin: 108 ng/mL (ref 22–322)

## 2013-11-02 LAB — SODIUM, URINE, RANDOM: Sodium, Ur: <20 mEq/L

## 2013-11-02 MED ORDER — SORBITOL 70 % SOLN
20.0000 mL | Freq: Two times a day (BID) | Status: DC
  Administered 2013-11-02 – 2013-11-05 (×3): 20 mL via ORAL
  Filled 2013-11-02 (×8): qty 30

## 2013-11-02 MED ORDER — FUROSEMIDE 40 MG PO TABS
40.0000 mg | ORAL_TABLET | Freq: Once | ORAL | Status: AC
  Administered 2013-11-02: 40 mg via ORAL
  Filled 2013-11-02: qty 1

## 2013-11-02 MED ORDER — VANCOMYCIN HCL IN DEXTROSE 750-5 MG/150ML-% IV SOLN
750.0000 mg | Freq: Two times a day (BID) | INTRAVENOUS | Status: DC
  Administered 2013-11-02: 750 mg via INTRAVENOUS
  Filled 2013-11-02: qty 150

## 2013-11-02 MED ORDER — SODIUM CHLORIDE 0.9 % IV SOLN
250.0000 mg | Freq: Four times a day (QID) | INTRAVENOUS | Status: DC
  Administered 2013-11-02 – 2013-11-05 (×13): 250 mg via INTRAVENOUS
  Filled 2013-11-02 (×15): qty 250

## 2013-11-02 MED ORDER — VANCOMYCIN HCL 10 G IV SOLR
1250.0000 mg | INTRAVENOUS | Status: DC
  Administered 2013-11-03: 1250 mg via INTRAVENOUS
  Filled 2013-11-02: qty 1250

## 2013-11-02 NOTE — Procedures (Signed)
US guided diagnostic/therapeutic paracentesis performed yielding 1.6 liters clear, light yellow fluid. A portion of the fluid was sent to the lab for preordered studies. No immediate complications.

## 2013-11-02 NOTE — Progress Notes (Signed)
ANTIBIOTIC CONSULT NOTE - FOLLOW UP  Pharmacy Consult for Vancomycin, Primaxin Indication: sepsis  Allergies  Allergen Reactions  . Penicillins Other (See Comments)    Rash hives, white spots.    Patient Measurements: Height: 5\' 9"  (175.3 cm) Weight: 223 lb 5.2 oz (101.3 kg) IBW/kg (Calculated) : 70.7  Vital Signs: Temp: 97.9 F (36.6 C) (10/20 0600) BP: 111/51 mmHg (10/20 0600) Pulse Rate: 100 (10/20 0600) Intake/Output from previous day: 10/19 0701 - 10/20 0700 In: 2638.3 [I.V.:1638.3; IV Piggyback:1000] Out: 325 [Urine:325] Intake/Output from this shift:    Labs:  Recent Labs  11/01/13 0050 11/01/13 0629 11/02/13 0340  WBC 16.6* 16.6* 15.5*  HGB 8.5* 8.5* 8.3*  PLT 88* 90* 86*  CREATININE 1.40* 1.44* 2.23*   Estimated Creatinine Clearance: 36.7 ml/min (by C-G formula based on Cr of 2.23). No results found for this basename: VANCOTROUGH, VANCOPEAK, VANCORANDOM, GENTTROUGH, GENTPEAK, GENTRANDOM, TOBRATROUGH, TOBRAPEAK, TOBRARND, AMIKACINPEAK, AMIKACINTROU, AMIKACIN,  in the last 72 hours    Assessment: 32 yoM with severe sepsis, likely secondary to pneumonia, however with decompensated liver cirrhosis and abdominal distention, MD cannot rule out SBP at this time. Unable to perform paracentesis 10/19 d/t trace ascites.  Reassessing 10/20 for ascitic tap.  Pharmacy consulted to dose empiric Vancomycin, Primaxin, Cipro.  Cipro d/c's 10/20.  10/18 >> Flagyl x 1 10/18 >> Vancomycin >> 10/18 >> Cipro >> 10/20 10/19 >> Primaxin >>  Tmax: afebrile WBCs: elevated but improving Renal: worsening, SCr 2.23, CrCl ~44 ml/min (CG), ~31 ml/in (normalized). S/p ECHO with contrast 10/19.  10/19 MRSA screen: neg 10/18 blood x 2: 2/2 GPC in pairs and chains 10/18 urine: insignificant growth Body fluid/ascitic cx: ordered  Today is day #3 Vancomycin 750mg  IV q12h, day #2 Primaxin 500mg  IV q8h for sepsis 2/2 pneumonia vs SBP. Unable to perform paracentesis d/t trace ascites.  Blood cultures positive for GPC in pairs/chains.  SCr worsening, possibly secondary to contrast given with ECHO.  Goal of Therapy:  Vancomycin trough level 15-20 mcg/ml Doses adjusted per renal function Eradication of infection  Plan:  1.  Adjust Vancomycin to 1250 mg q24h for worsening SCr. 2.  Adjust Primaxin to 250 mg q6h for worsening SCr. 3.  F/u culture results, SCr, clinical course.  Hershal Coria 11/02/2013,9:05 AM

## 2013-11-02 NOTE — Evaluation (Signed)
Physical Therapy Evaluation Patient Details Name: ISAC LINCKS MRN: [id number] DOB: 1945/12/01 Today's Date: 11/02/2013   History of Present Illness  68 yo male admitted with sepsis, pna, ascites, fall, rib fx, elevated troponin. Hx of liver cirrhosis, GI bleed, lumbar comp fx.   Clinical Impression  On eval, pt required Min assist + 2 for mobility-able to ambulate ~55 feet with rolling walker. Demonstrates general weakness, decreased activity tolerance, and impaired gait and balance. Tolerated activity fairly well. Will continue to assess assist needed and overall progress.     Follow Up Recommendations Home health PT vs SNF;Supervision/Assistance - 24 hour (depending on progress)    Equipment Recommendations  Rolling walker with 5" wheels    Recommendations for Other Services       Precautions / Restrictions Precautions Precautions: Fall Restrictions Weight Bearing Restrictions: No      Mobility  Bed Mobility Overal bed mobility: Needs Assistance Bed Mobility: Supine to Sit;Sit to Supine     Supine to sit: Min assist Sit to supine: Min assist   General bed mobility comments: Increased time. Assist for trunk and LEs. Pt very shaky.   Transfers Overall transfer level: Needs assistance Equipment used: Rolling walker (2 wheeled) Transfers: Sit to/from Stand Sit to Stand: Min assist;From elevated surface         General transfer comment: assist to rise, stabilize, control descent. VCs safety, technique, hand placement.   Ambulation/Gait Ambulation/Gait assistance: Min assist;+2 physical assistance;+2 safety/equipment Ambulation Distance (Feet): 55 Feet Assistive device: Rolling walker (2 wheeled) Gait Pattern/deviations: Step-through pattern;Decreased stride length     General Gait Details: slow gait speed. Pt is unsteady and shaky. Remained on O2 during ambulation. tolerated distance fairly well.   Stairs            Wheelchair Mobility    Modified  Rankin (Stroke Patients Only)       Balance                                             Pertinent Vitals/Pain Pain Assessment: No/denies pain    Home Living Family/patient expects to be discharged to:: Private residence Living Arrangements: Spouse/significant other Available Help at Discharge: Family Type of Home: House Home Access: Stairs to enter Entrance Stairs-Rails: None Technical brewer of Steps: 2 Home Layout: One level Home Equipment: None      Prior Function Level of Independence: Independent               Hand Dominance        Extremity/Trunk Assessment   Upper Extremity Assessment: Generalized weakness           Lower Extremity Assessment: Generalized weakness      Cervical / Trunk Assessment: Normal  Communication   Communication: No difficulties  Cognition Arousal/Alertness: Awake/alert Behavior During Therapy: WFL for tasks assessed/performed Overall Cognitive Status: Within Functional Limits for tasks assessed                      General Comments      Exercises        Assessment/Plan    PT Assessment Patient needs continued PT services  PT Diagnosis Difficulty walking;Generalized weakness   PT Problem List Decreased strength;Decreased activity tolerance;Decreased balance;Decreased mobility;Decreased knowledge of use of DME;Cardiopulmonary status limiting activity  PT Treatment Interventions DME instruction;Gait training;Functional mobility training;Therapeutic activities;Therapeutic exercise;Patient/family education;Balance  training   PT Goals (Current goals can be found in the Care Plan section) Acute Rehab PT Goals Patient Stated Goal: home. get better PT Goal Formulation: With patient Time For Goal Achievement: 11/16/13 Potential to Achieve Goals: Good    Frequency Min 3X/week   Barriers to discharge        Co-evaluation               End of Session Equipment Utilized  During Treatment: Gait belt;Oxygen Activity Tolerance: Patient tolerated treatment well Patient left: in bed;with call bell/phone within reach           Time: 1329-1347 PT Time Calculation (min): 18 min   Charges:   PT Evaluation $Initial PT Evaluation Tier I: 1 Procedure PT Treatments $Gait Training: 8-22 mins   PT G Codes:          Weston Anna, MPT Pager: [phone]

## 2013-11-02 NOTE — Progress Notes (Addendum)
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 CT chest neg for PE, suggest PNA, Labs suggesting AKI, hyponatremia Found on admission to have increasing abd swelling, demand ischemia, suspected PNA vs SBP  Past medical history-As per Problem list Chart reviewed as below- reviewed  Consultants:  None yet  Procedures:  Ct scan spine  Ct head  Ct angio chest  Antibiotics:  Primaxin 10/18  Vancomycin 10/18  Ciprofloxacin 10/18-10/20   Subjective   Doing fair.  Feels distended.  No n/v/cp currently Increasing LE swelling Mild sob and desat to low 90's without oxygen  No cough no sputum   Objective    Interim History: Reviewed  Telemetry: Sinus tachycardia 120s   Objective: Filed Vitals:   11/02/13 0303 11/02/13 0400 11/02/13 0500 11/02/13 0600  BP: 94/42 120/57 107/64 111/51  Pulse: 103 103 102 100  Temp: 98.4 F (36.9 C) 98.2 F (36.8 C) 98.1 F (36.7 C) 97.9 F (36.6 C)  TempSrc:      Resp: 20 16 17 17   Height:      Weight:      SpO2: 85% 95% 95% 96%    Intake/Output Summary (Last 24 hours) at 11/02/13 0754 Last data filed at 11/02/13 4656  Gross per 24 hour  Intake 2638.33 ml  Output    325 ml  Net 2313.33 ml    Exam:  General: Alert oriented no apparent distress Cardiovascular: S1-S2 tachycardic Respiratory: Clinically clear no added sounds Abdomen: Soft distended, anasarca Skin lower extremity edema noted Neuro intact  Data Reviewed: Basic Metabolic Panel:  Recent Labs Lab 10/31/13 1541 11/01/13 0050 11/01/13 0629 11/02/13 0340  NA 123*  --  126* 123*  K 4.9  --  4.9 4.9  CL 85*  --  90* 88*  CO2 23  --  24 22  GLUCOSE 79  --  84 117*  BUN 25*  --  27* 39*  CREATININE 1.30 1.40* 1.44*  2.23*  CALCIUM 7.9*  --  7.3* 7.4*   Liver Function Tests:  Recent Labs Lab 10/31/13 1541 11/01/13 0629 11/02/13 0340  AST 118* 102* 81*  ALT 49 43 42  ALKPHOS 111 89 117  BILITOT 4.2* 3.4* 3.2*  PROT 5.7* 4.8* 5.0*  ALBUMIN 2.1* 1.8* 1.8*    Recent Labs Lab 10/31/13 1541  LIPASE 30    Recent Labs Lab 10/31/13 1544  AMMONIA 70*   CBC:  Recent Labs Lab 10/31/13 1541 11/01/13 0050 11/01/13 0629 11/02/13 0340  WBC 19.7* 16.6* 16.6* 15.5*  NEUTROABS 18.1*  --  15.0* 13.2*  HGB 9.5* 8.5* 8.5* 8.3*  HCT 27.7* 23.7* 24.1* 23.0*  MCV 93.6 93.3 91.3 92.0  PLT 106* 88* 90* 86*   Cardiac Enzymes:  Recent Labs Lab 10/31/13 1541 10/31/13 1839 11/01/13 0050 11/01/13 0629 11/01/13 1209  TROPONINI 0.82* 0.45* 0.88* 0.72* 0.60*   BNP: No components found with this basename: POCBNP,  CBG: No results found for this basename: GLUCAP,  in the last 168 hours  Recent Results (from the past 240 hour(s))  URINE CULTURE     Status: None   Collection Time    10/31/13  7:20 PM      Result Value Ref Range Status   Specimen Description URINE, CLEAN CATCH   Final   Special Requests  Immunocompromised   Final   Culture  Setup Time     Final   Value: 11/01/2013 03:13     Performed at [place]     Final   Value: 7,000 COLONIES/ML     Performed at [facility]   Culture     Final   Value: INSIGNIFICANT GROWTH     Performed at [facility]   Report Status 11/02/2013 FINAL   Final  CULTURE, BLOOD (ROUTINE X 2)     Status: None   Collection Time    10/31/13  8:38 PM      Result Value Ref Range Status   Specimen Description BLOOD L HAND   Final   Special Requests     Final   Value: BOTTLES DRAWN AEROBIC AND ANAEROBIC IMMUNOCOMPROMISED 5ML EACH   Culture  Setup Time     Final   Value: 11/01/2013 02:11     Performed at [facility]   Culture     Final   Value: GRAM POSITIVE COCCI IN PAIRS AND CHAINS     Note: Gram Stain  Report Called to,Read Back By and Verified With: LISA FREI ON 11/01/2013 AT 9:28P BY WILEJ     Performed at [facility]   Report Status PENDING   Incomplete  CULTURE, BLOOD (ROUTINE X 2)     Status: None   Collection Time    10/31/13  8:40 PM      Result Value Ref Range Status   Specimen Description BLOOD 10ML   Final   Special Requests     Final   Value: BOTTLES DRAWN AEROBIC AND ANAEROBIC IMMUNOCOMPROMISED DRAWN FROM UNK LOCATION   Culture  Setup Time     Final   Value: 11/01/2013 02:11     Performed at [facility]   Culture     Final   Value: GRAM POSITIVE COCCI IN PAIRS AND CHAINS     Note: Culture results may be compromised due to an excessive volume of blood received in culture bottles. Gram Stain Report Called to,Read Back By and Verified With: LISA FREI ON 11/01/2013 AT 9:28P BY WILEJ     Performed at [facility]   Report Status PENDING   Incomplete  MRSA PCR SCREENING     Status: None   Collection Time    11/01/13 12:06 AM      Result Value Ref Range Status   MRSA by PCR NEGATIVE  NEGATIVE Final   Comment:            The GeneXpert MRSA Assay (FDA     approved for NASAL specimens     only), is one component of a     comprehensive MRSA colonization     surveillance program. It is not     intended to diagnose MRSA     infection nor to guide or     monitor treatment for     MRSA infections.     Studies:              All Imaging reviewed and is as per above notation   Scheduled Meds: . antiseptic oral rinse  7 mL Mouth Rinse q12n4p  . chlorhexidine  15 mL Mouth Rinse BID  . imipenem-cilastatin  500 mg Intravenous Q8H  . pantoprazole (PROTONIX) IV  40 mg Intravenous Q24H  . sodium chloride  3 mL Intravenous Q12H  . vancomycin  750 mg Intravenous Q12H  Continuous Infusions: . sodium chloride 50 mL/hr at 11/01/13 1446     Assessment/Plan:  1. Severe sepsis-most likely pneumonia >SBP->unlikely Ascending cholangitis, however BC x 2 shows  Gram + cocci in pairs and chains. Continue Primaxin, vancomycin, ciprofloxacin d/c 10/20. Keep IV fluids going.  I/O not accurate but needs IVF to support BP.   2. Constitutional hypotension-usual blood pressure lower than 100/50 per family.   3. Demand ischemia-troponins stable and seem to be trending downward. Echo shows some WM anomally but poor acoustic windows.  Continue to cycle troponin's and rpt EKG 10/20.  D/w Dr. Marlou Porch, Cardiology who agrees with ECHO c contrast 4. SBP?-ON US abdomen 10/19-limited ascitic fluid making SBP less likely. Reassess 10/20 for Ascitic tap and obtain cell count, Gram stain, fluid protein and a calculated SAAG-this is likely secondary to portal hypertension. 5. Liver disease-Family mentions h/o Hemachromatosis-Ferritin is pending-His GI Md is Dr. Watt Climes and we will consult [facility]ie [facility]er if needed.  For now hold off Aldactone 50 but due to swelling in LE's and abdomen, give laxix 40 po x 1 as concomittant hypotension. PLT 86,   INR 1.7, so SCD's 6. Acute Kidney injury-likely 2/2 to contrast as well as diuretics and hypotension.  Will get stat FeNA.  Continue IVF c K at 125 cc/hour 7. Hypokalemia-replace with K in fluids 8. Possible PNA-CXr 2 vw confirms infiltrates.  Continue IV abx 9. Weakness-likely 2/2 to multiple issues-PT to evaluate once procedures completed 10. Rib Fracture-continue Toradol.  Discontinued opiates and Tylenol due to Risk or sedation/worsening liver function.  Likely etiology of PNA 11. Hyponatremia, hypervolemic-2/2 to third spacing from Ascties.  Patient's diuretics were increased by primary care physician to double the dose earlier last week and that is when he started having falls and weakness 12. 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 Stable currently. Repeat labs in a.m. 13. Prior GI bleed-discontinue heparin and start SCDs for prophylaxis. IV Protonix 40 mg daily  Code Status: Full Family Communication: Discussed with family members at  bedside all questions answered Disposition Plan: Keep on step down for now  40 min  Verneita Griffes, MD  [facility] Pager [phone] 11/02/2013, 7:54 AM    LOS: 2 days

## 2013-11-02 NOTE — Progress Notes (Signed)
CRITICAL VALUE ALERT  Critical value received:  Blood Cultures (both Anerobic & Aerobic) Gram + Cocci in pairs & chains  Date of notification:  11/02/13  Time of notification:  12:40  Critical value read back:Yes.    Nurse who received alert:  Bethann Humble, RN  MD notified (1st page):  Gaetano Net, NP [facility]  Time of first page:  12:40  MD notified (2nd page):  Time of second page:  Responding MD:  K. Shorr, NP [facility]  Time MD responded:  12:40

## 2013-11-02 NOTE — Progress Notes (Signed)
CRITICAL VALUE ALERT  Critical value received:  WBC polynuclear and mononuclear in ascitic fluid gram stain   Date of notification:  11/02/13  Time of notification:  1122  Critical value read back: yes  Nurse who received alert:  Geannie Risen, RN  MD notified (1st page):  Dr. Verlon Au text paged  Time of first page:  1351  MD notified (2nd page):  Time of second page:  Responding MD:    Time MD responded:

## 2013-11-02 NOTE — Plan of Care (Signed)
Problem: Phase I Progression Outcomes Goal: OOB as tolerated unless otherwise ordered Outcome: Progressing Pt ambulated with PT from room 1231 to 1230 and back with the assistance of walker.

## 2013-11-03 DIAGNOSIS — R7989 Other specified abnormal findings of blood chemistry: Secondary | ICD-10-CM

## 2013-11-03 DIAGNOSIS — R7881 Bacteremia: Secondary | ICD-10-CM

## 2013-11-03 DIAGNOSIS — A419 Sepsis, unspecified organism: Secondary | ICD-10-CM

## 2013-11-03 DIAGNOSIS — R578 Other shock: Secondary | ICD-10-CM

## 2013-11-03 LAB — COMPREHENSIVE METABOLIC PANEL
ALT: 41 U/L (ref 0–53)
AST: 70 U/L — AB (ref 0–37)
Albumin: 1.8 g/dL — ABNORMAL LOW (ref 3.5–5.2)
Alkaline Phosphatase: 98 U/L (ref 39–117)
Anion gap: 14 (ref 5–15)
BILIRUBIN TOTAL: 3.2 mg/dL — AB (ref 0.3–1.2)
BUN: 47 mg/dL — AB (ref 6–23)
CALCIUM: 7.4 mg/dL — AB (ref 8.4–10.5)
CO2: 21 meq/L (ref 19–32)
CREATININE: 2.88 mg/dL — AB (ref 0.50–1.35)
Chloride: 90 mEq/L — ABNORMAL LOW (ref 96–112)
GFR calc Af Amer: 24 mL/min — ABNORMAL LOW (ref >90)
GFR, EST NON AFRICAN AMERICAN: 21 mL/min — AB (ref >90)
Glucose, Bld: 93 mg/dL (ref 70–99)
Potassium: 5.2 mEq/L (ref 3.7–5.3)
Sodium: 125 mEq/L — ABNORMAL LOW (ref 137–147)
Total Protein: 5.2 g/dL — ABNORMAL LOW (ref 6.0–8.3)

## 2013-11-03 LAB — CBC WITH DIFFERENTIAL/PLATELET
BASOS PCT: 0 % (ref 0–1)
Basophils Absolute: 0 10*3/uL (ref 0.0–0.1)
EOS ABS: 0.6 10*3/uL (ref 0.0–0.7)
EOS PCT: 4 % (ref 0–5)
HCT: 24.3 % — ABNORMAL LOW (ref 39.0–52.0)
Hemoglobin: 8.8 g/dL — ABNORMAL LOW (ref 13.0–17.0)
Lymphocytes Relative: 8 % — ABNORMAL LOW (ref 12–46)
Lymphs Abs: 1 10*3/uL (ref 0.7–4.0)
MCH: 33 pg (ref 26.0–34.0)
MCHC: 36.2 g/dL — AB (ref 30.0–36.0)
MCV: 91 fL (ref 78.0–100.0)
MONOS PCT: 8 % (ref 3–12)
Monocytes Absolute: 1 10*3/uL (ref 0.1–1.0)
Neutro Abs: 10.7 10*3/uL — ABNORMAL HIGH (ref 1.7–7.7)
Neutrophils Relative %: 80 % — ABNORMAL HIGH (ref 43–77)
PLATELETS: 93 10*3/uL — AB (ref 150–400)
RBC: 2.67 MIL/uL — ABNORMAL LOW (ref 4.22–5.81)
RDW: 18.5 % — ABNORMAL HIGH (ref 11.5–15.5)
WBC: 13.3 10*3/uL — ABNORMAL HIGH (ref 4.0–10.5)

## 2013-11-03 LAB — TROPONIN I
Troponin I: <0.3 ng/mL (ref <0.30)
Troponin I: <0.3 ng/mL (ref <0.30)

## 2013-11-03 LAB — PROTIME-INR
INR: 1.56 — AB (ref 0.00–1.49)
Prothrombin Time: 18.8 seconds — ABNORMAL HIGH (ref 11.6–15.2)

## 2013-11-03 MED ORDER — FLUDROCORTISONE ACETATE 0.1 MG PO TABS
0.1000 mg | ORAL_TABLET | Freq: Two times a day (BID) | ORAL | Status: DC
Start: 2013-11-03 — End: 2013-11-09
  Administered 2013-11-03 – 2013-11-09 (×12): 0.1 mg via ORAL
  Filled 2013-11-03 (×13): qty 1

## 2013-11-03 MED ORDER — PERFLUTREN LIPID MICROSPHERE
1.0000 mL | INTRAVENOUS | Status: AC | PRN
  Administered 2013-11-03: 2 mL via INTRAVENOUS
  Filled 2013-11-03: qty 10

## 2013-11-03 MED ORDER — BUDESONIDE 0.25 MG/2ML IN SUSP
0.2500 mg | Freq: Two times a day (BID) | RESPIRATORY_TRACT | Status: DC
  Administered 2013-11-03 – 2013-11-04 (×3): 0.25 mg via RESPIRATORY_TRACT
  Filled 2013-11-03 (×3): qty 2

## 2013-11-03 NOTE — Progress Notes (Signed)
ANTIBIOTIC CONSULT NOTE - FOLLOW UP  Pharmacy Consult for Vancomycin, Primaxin Indication: sepsis  Allergies  Allergen Reactions  . Penicillins Other (See Comments)    Rash hives, white spots.    Patient Measurements: Height: 5\' 9"  (175.3 cm) Weight: 232 lb 5.8 oz (105.4 kg) IBW/kg (Calculated) : 70.7  Vital Signs: Temp: 97.7 F (36.5 C) (10/21 0800) Temp Source: Core (Comment) (10/21 0600) BP: 101/38 mmHg (10/21 1000) Pulse Rate: 109 (10/21 1000) Intake/Output from previous day: 10/20 0701 - 10/21 0700 In: 2320 [P.O.:720; I.V.:1050; IV Piggyback:550] Out: 527 [Urine:525; Stool:2] Intake/Output from this shift: Total I/O In: -  Out: 280 [Urine:280]  Labs:  Recent Labs  11/01/13 0629 11/02/13 0340 11/02/13 0921 11/03/13 0155  WBC 16.6* 15.5*  --  13.3*  HGB 8.5* 8.3*  --  8.8*  PLT 90* 86*  --  93*  LABCREA  --   --  144.01  --   CREATININE 1.44* 2.23*  --  2.88*   Estimated Creatinine Clearance: 29 ml/min (by C-G formula based on Cr of 2.88). No results found for this basename: VANCOTROUGH, VANCOPEAK, VANCORANDOM, GENTTROUGH, GENTPEAK, GENTRANDOM, TOBRATROUGH, TOBRAPEAK, TOBRARND, AMIKACINPEAK, AMIKACINTROU, AMIKACIN,  in the last 72 hours    Assessment: 48 yoM with severe sepsis, likely secondary to pneumonia, however with decompensated liver cirrhosis and abdominal distention, MD cannot rule out SBP at this time. Unable to perform paracentesis 10/19 d/t trace ascites.  Reassessing 10/20 for ascitic tap.  Pharmacy consulted to dose empiric Vancomycin, Primaxin, Cipro.  Cipro d/c's 10/20.  10/18 >> Flagyl x 1 10/18 >> Vancomycin >> 10/18 >> Cipro >> 10/20 10/19 >> Primaxin >>  Tmax: afebrile WBCs: elevated but improving Renal: worsening, SCr 2.88, CrCl ~35 ml/min (CG), ~24 ml/in (normalized). S/p ECHO with contrast 10/19.  10/19 MRSA screen: neg 10/18 blood x 2: 2/2 GPC in pairs and chains 10/18 urine: insignificant growth 10/20 ascites fluid cx: IP,  gram stain shows WBC, no organisms  Today is day #4 Vancomycin 1250mg  IV q24h, day #3 Primaxin 250mg  IV q8h for sepsis 2/2 pneumonia vs SBP. Blood cultures positive for GPC in pairs/chains.  Paracentesis performed 10/20 and fluid sent for cultures.  SCr worsening, possibly secondary to contrast given with ECHO on 10/19.  Goal of Therapy:  Vancomycin trough level 15-20 mcg/ml Doses adjusted per renal function Eradication of infection  Plan:  1.  With worsening SCr, check vancomycin level tomorrow morning before next dose to determine if patient requires q24h vs q48h dosing per obese nomogram.  Will hold further vancomycin doses to allow follow up of level and will re-enter order when appropriate. 2.  Continue Primaxin to 250 mg q6h. 3.  F/u culture results, SCr, clinical course.  Hershal Coria 11/03/2013,1:39 PM

## 2013-11-03 NOTE — Progress Notes (Addendum)
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 CT chest neg for PE, suggest PNA, Labs suggesting AKI, hyponatremia Found on admission to have increasing abd swelling, demand ischemia, suspected PNA vs SBP  Past medical history-As per Problem list Chart reviewed as below- reviewed  Consultants:  None yet  Procedures:  Ct scan spine  Ct head  Ct angio chest  Antibiotics:  Primaxin 10/18  Vancomycin 10/18  Ciprofloxacin 10/18-10/20   Subjective   Doing fair.  Slight SOB and wheezing. No n/v/cp currently   Objective    Interim History: Reviewed  Telemetry: Sinus tachycardia 120s   Objective: Filed Vitals:   11/03/13 1600 11/03/13 1700 11/03/13 1800 11/03/13 1900  BP: 105/43 104/48 95/43 105/53  Pulse: 104 106 105 102  Temp: 97.7 F (36.5 C) 97.7 F (36.5 C) 97.7 F (36.5 C) 97.9 F (36.6 C)  TempSrc: Core (Comment)     Resp: 21 18 18 19   Height:      Weight:      SpO2: 94% 94% 95% 92%    Intake/Output Summary (Last 24 hours) at 11/03/13 1943 Last data filed at 11/03/13 1900  Gross per 24 hour  Intake   1540 ml  Output    830 ml  Net    710 ml    Exam:  General: Alert oriented, mild distress due to abd discomfort and SOB Cardiovascular: S1-S2 tachycardic Respiratory: positive wheezing, no crackles Abdomen: Soft distended, anasarca Skin lower extremity edema noted Neuro intact  Data Reviewed: Basic Metabolic Panel:  Recent Labs Lab 10/31/13 1541 11/01/13 0050 11/01/13 0629 11/02/13 0340 11/03/13 0155  NA 123*  --  126* 123* 125*  K 4.9  --  4.9 4.9 5.2  CL 85*  --  90* 88* 90*  CO2 23  --  24 22 21   GLUCOSE 79  --  84 117* 93  BUN 25*  --  27* 39* 47*  CREATININE 1.30 1.40* 1.44* 2.23* 2.88*   CALCIUM 7.9*  --  7.3* 7.4* 7.4*   Liver Function Tests:  Recent Labs Lab 10/31/13 1541 11/01/13 0629 11/02/13 0340 11/03/13 0155  AST 118* 102* 81* 70*  ALT 49 43 42 41  ALKPHOS 111 89 117 98  BILITOT 4.2* 3.4* 3.2* 3.2*  PROT 5.7* 4.8* 5.0* 5.2*  ALBUMIN 2.1* 1.8* 1.8* 1.8*    Recent Labs Lab 10/31/13 1541  LIPASE 30    Recent Labs Lab 10/31/13 1544  AMMONIA 70*   CBC:  Recent Labs Lab 10/31/13 1541 11/01/13 0050 11/01/13 0629 11/02/13 0340 11/03/13 0155  WBC 19.7* 16.6* 16.6* 15.5* 13.3*  NEUTROABS 18.1*  --  15.0* 13.2* 10.7*  HGB 9.5* 8.5* 8.5* 8.3* 8.8*  HCT 27.7* 23.7* 24.1* 23.0* 24.3*  MCV 93.6 93.3 91.3 92.0 91.0  PLT 106* 88* 90* 86* 93*   Cardiac Enzymes:  Recent Labs Lab 11/02/13 0838 11/02/13 1345 11/02/13 1958 11/03/13 0155 11/03/13 0800  TROPONINI 0.37* <0.30 0.31* <0.30 <0.30   BNP: No components found with this basename: POCBNP,  CBG: No results found for this basename: GLUCAP,  in the last 168 hours  Recent Results (from the past 240 hour(s))  URINE CULTURE     Status: None   Collection Time    10/31/13  7:20 PM  Result Value Ref Range Status   Specimen Description URINE, CLEAN CATCH   Final   Special Requests Immunocompromised   Final   Culture  Setup Time     Final   Value: 11/01/2013 03:13     Performed at [facility]     Final   Value: 7,000 COLONIES/ML     Performed at [facility]   Culture     Final   Value: INSIGNIFICANT GROWTH     Performed at [facility]   Report Status 11/02/2013 FINAL   Final  CULTURE, BLOOD (ROUTINE X 2)     Status: None   Collection Time    10/31/13  8:38 PM      Result Value Ref Range Status   Specimen Description BLOOD L HAND   Final   Special Requests     Final   Value: BOTTLES DRAWN AEROBIC AND ANAEROBIC IMMUNOCOMPROMISED 5ML EACH   Culture  Setup Time     Final   Value: 11/01/2013 02:11     Performed at [facility]    Culture     Final   Value: GRAM POSITIVE COCCI IN PAIRS AND CHAINS     Note: Gram Stain Report Called to,Read Back By and Verified With: LISA FREI ON 11/01/2013 AT 9:28P BY WILEJ     Performed at [facility]   Report Status PENDING   Incomplete  CULTURE, BLOOD (ROUTINE X 2)     Status: None   Collection Time    10/31/13  8:40 PM      Result Value Ref Range Status   Specimen Description BLOOD 10ML   Final   Special Requests     Final   Value: BOTTLES DRAWN AEROBIC AND ANAEROBIC IMMUNOCOMPROMISED DRAWN FROM UNK LOCATION   Culture  Setup Time     Final   Value: 11/01/2013 02:11     Performed at [facility]   Culture     Final   Value: GRAM POSITIVE COCCI IN PAIRS AND CHAINS     Note: Culture results may be compromised due to an excessive volume of blood received in culture bottles. Gram Stain Report Called to,Read Back By and Verified With: LISA FREI ON 11/01/2013 AT 9:28P BY WILEJ     Performed at [facility]   Report Status PENDING   Incomplete  MRSA PCR SCREENING     Status: None   Collection Time    11/01/13 12:06 AM      Result Value Ref Range Status   MRSA by PCR NEGATIVE  NEGATIVE Final   Comment:            The GeneXpert MRSA Assay (FDA     approved for NASAL specimens     only), is one component of a     comprehensive MRSA colonization     surveillance program. It is not     intended to diagnose MRSA     infection nor to guide or     monitor treatment for     MRSA infections.  BODY FLUID CULTURE     Status: None   Collection Time    11/02/13 10:09 AM      Result Value Ref Range Status   Specimen Description FLUID ASCITES   Final   Special Requests Immunocompromised   Final   Gram Stain     Final   Value: WBC PRESENT,BOTH PMN AND MONONUCLEAR  NO ORGANISMS SEEN     CYTOSPIN Performed by [facility]     Performed at [facility]   Culture     Final   Value: NO GROWTH 1 DAY     Note: Gram Stain Report Called to,Read Back  By and Verified With: S RUSSEL RN AT 1122 ON 10.20.15 BY SHUEA     Performed at [facility]   Report Status PENDING   Incomplete  GRAM STAIN     Status: None   Collection Time    11/02/13 10:09 AM      Result Value Ref Range Status   Specimen Description FLUID ASCITES   Final   Special Requests Immunocompromised   Final   Gram Stain     Final   Value: CYTOSPIN     WBC PRESENT,BOTH PMN AND MONONUCLEAR     NO ORGANISMS SEEN     Gram Stain Report Called to,Read Back By and Verified With: Armandina Stammer RN AT 1122 ON 10.20.15 BY SHUEA   Report Status 11/02/2013 FINAL   Final     Studies:              All Imaging reviewed and is as per above notation   Scheduled Meds: . antiseptic oral rinse  7 mL Mouth Rinse q12n4p  . budesonide (PULMICORT) nebulizer solution  0.25 mg Nebulization BID  . chlorhexidine  15 mL Mouth Rinse BID  . fludrocortisone  0.1 mg Oral BID  . imipenem-cilastatin  250 mg Intravenous 4 times per day  . pantoprazole (PROTONIX) IV  40 mg Intravenous Q24H  . sodium chloride  3 mL Intravenous Q12H  . sorbitol  20 mL Oral BID   Continuous Infusions: . sodium chloride 10 mL/hr at 11/03/13 1400     Assessment/Plan:  1. Severe sepsis-most likely pneumonia and BC x 2 shows Gram + cocci in pairs and chains. Continue Primaxin and vancomycin. Afebrile and WBC's trending down. Will add pulmicort 2. Constitutional hypotension-usual blood pressure lower than 100/50 per family.   3. Demand ischemia-troponins stable and seem to be trending downward. Echo shows some WM anomally but poor acoustic windows, same as per contrast Echo. No CP.  4. Ascites: due to cirrhosis and hypoalbuminemia; starting to bothering patient breathing and diaphragm expansion. Will ask IR to perform paracentesis    5. Liver disease-Family mentions h/o Hemachromatosis-Ferritin is normal. His GI is Dr. Watt Climes and we will consult [facility] if needed.  For now hold off Aldactone and lasix, due to concomittant  hypotension.  6. Acute Kidney injury-likely 2/2 to contrast as well as diuretics and hypotension.  Continue worsening. Will follow closely. 7. Hypokalemia-repleted 8. Possible PNA-CXr 2 vw confirms infiltrates.  Continue IV abx 9. Weakness-likely 2/2 to multiple issues-PT to evaluate and provide recommendations  10. Rib Fracture-continue Toradol only if significant pain, given renal failure.  Discontinued opiates and Tylenol due to Risk or sedation/worsening liver function.   11. Hyponatremia, hypervolemic-2/2 to third spacing from Ascites and chronic cirrhosis.  Lasix on hold now due to worsening kidney function. Will discontinue IVF's 12. 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 Stable currently. Repeat labs in a.m. 13. Prior GI bleed-discontinue heparin due to high risk for bleeding and start SCDs for prophylaxis. Continue IV Protonix 40 mg daily  Code Status: Full Family Communication: Discussed with family members at bedside all questions answered Disposition Plan: Keep on step down for now  Time: 21min  Akeira Lahm, [place] 11/03/2013, 7:43 PM  LOS: 3 days

## 2013-11-03 NOTE — Progress Notes (Signed)
  Echocardiogram 2D Echocardiogram limited with Definity has been performed.  Diamond Nickel 11/03/2013, 11:36 AM

## 2013-11-04 ENCOUNTER — Inpatient Hospital Stay (HOSPITAL_COMMUNITY): Payer: Medicare Other

## 2013-11-04 DIAGNOSIS — R0602 Shortness of breath: Secondary | ICD-10-CM

## 2013-11-04 DIAGNOSIS — D696 Thrombocytopenia, unspecified: Secondary | ICD-10-CM

## 2013-11-04 DIAGNOSIS — R188 Other ascites: Secondary | ICD-10-CM

## 2013-11-04 LAB — VANCOMYCIN, RANDOM: VANCOMYCIN RM: 24.8 ug/mL

## 2013-11-04 LAB — CULTURE, BLOOD (ROUTINE X 2)

## 2013-11-04 LAB — BASIC METABOLIC PANEL
ANION GAP: 14 (ref 5–15)
BUN: 50 mg/dL — ABNORMAL HIGH (ref 6–23)
CHLORIDE: 91 meq/L — AB (ref 96–112)
CO2: 20 meq/L (ref 19–32)
Calcium: 7.7 mg/dL — ABNORMAL LOW (ref 8.4–10.5)
Creatinine, Ser: 2.61 mg/dL — ABNORMAL HIGH (ref 0.50–1.35)
GFR calc non Af Amer: 23 mL/min — ABNORMAL LOW (ref >90)
GFR, EST AFRICAN AMERICAN: 27 mL/min — AB (ref >90)
Glucose, Bld: 113 mg/dL — ABNORMAL HIGH (ref 70–99)
POTASSIUM: 4.6 meq/L (ref 3.7–5.3)
Sodium: 125 mEq/L — ABNORMAL LOW (ref 137–147)

## 2013-11-04 LAB — CBC
HEMATOCRIT: 24.9 % — AB (ref 39.0–52.0)
HEMOGLOBIN: 8.8 g/dL — AB (ref 13.0–17.0)
MCH: 32.5 pg (ref 26.0–34.0)
MCHC: 35.3 g/dL (ref 30.0–36.0)
MCV: 91.9 fL (ref 78.0–100.0)
Platelets: 76 10*3/uL — ABNORMAL LOW (ref 150–400)
RBC: 2.71 MIL/uL — AB (ref 4.22–5.81)
RDW: 18.5 % — ABNORMAL HIGH (ref 11.5–15.5)
WBC: 10 10*3/uL (ref 4.0–10.5)

## 2013-11-04 LAB — TROPONIN I

## 2013-11-04 LAB — AMMONIA: AMMONIA: 10 umol/L — AB (ref 11–60)

## 2013-11-04 MED ORDER — FUROSEMIDE 20 MG PO TABS: 20.0000 mg | ORAL_TABLET | Freq: Once | ORAL | Status: DC

## 2013-11-04 MED ORDER — FUROSEMIDE 20 MG PO TABS
20.0000 mg | ORAL_TABLET | Freq: Every day | ORAL | Status: DC
  Administered 2013-11-04: 20 mg via ORAL
  Filled 2013-11-04 (×2): qty 1

## 2013-11-04 MED ORDER — PANTOPRAZOLE SODIUM 40 MG PO TBEC
40.0000 mg | DELAYED_RELEASE_TABLET | Freq: Every day | ORAL | Status: DC
  Administered 2013-11-04 – 2013-11-09 (×6): 40 mg via ORAL
  Filled 2013-11-04 (×6): qty 1

## 2013-11-04 MED ORDER — VECURONIUM BROMIDE 10 MG IV SOLR: 10.0000 mg | Freq: Once | INTRAVENOUS | Status: DC

## 2013-11-04 MED ORDER — BOOST / RESOURCE BREEZE PO LIQD
1.0000 | Freq: Three times a day (TID) | ORAL | Status: DC
  Administered 2013-11-04: 20:00:00 via ORAL
  Administered 2013-11-04 – 2013-11-09 (×13): 1 via ORAL

## 2013-11-04 MED ORDER — KETOROLAC TROMETHAMINE 15 MG/ML IJ SOLN: 15.0000 mg | Freq: Three times a day (TID) | INTRAMUSCULAR | Status: DC | PRN

## 2013-11-04 NOTE — Progress Notes (Addendum)
Clinical Social Work Department CLINICAL SOCIAL WORK PLACEMENT NOTE 11/04/2013  Patient:  Clifford Cook, Clifford Cook  Account Number:  [id number] Admit date:  10/31/2013  Clinical Social Worker:  Ulyess Blossom  Date/time:  11/04/2013 02:00 PM  Clinical Social Work is seeking post-discharge placement for this patient at the following level of care:   SKILLED NURSING   (*CSW will update this form in Epic as items are completed)   11/04/2013  Patient/family provided with [place] Department of Clinical Social Work's list of facilities offering this level of care within the geographic area requested by the patient (or if unable, by the patient's family).  11/04/2013  Patient/family informed of their freedom to choose among providers that offer the needed level of care, that participate in Medicare, Medicaid or managed care program needed by the patient, have an available bed and are willing to accept the patient.  11/04/2013  Patient/family informed of [facility]' ownership interest in [facility], as well as of the fact that they are under no obligation to receive care at this facility.  PASARR submitted to EDS on 11/04/2013 PASARR number received on 11/04/2013  FL2 transmitted to all facilities in geographic area requested by pt/family on  11/04/2013 FL2 transmitted to all facilities within larger geographic area on   Patient informed that his/her managed care company has contracts with or will negotiate with  certain facilities, including the following:     Patient/family informed of bed offers received:  11/05/2013 Patient chooses bed at [facility] and [place] recommends and patient chooses bed at    Patient to be transferred to  on  [facility] and [place] on 11/09/2013 Patient to be transferred to facility by ambulance Corey Harold) Patient and family notified of transfer on 11/09/2013 Name of family member notified:  Pt notified at bedside. Pt wife, Manuela Schwartz and  pt daughter, Mickel Baas notified at bedside.  The following physician request were entered in Epic:   Additional Comments:   Alison Murray, MSW, LCSW Clinical Social Work [phone] Coverage for Raynaldo Opitz, [place]

## 2013-11-04 NOTE — Progress Notes (Signed)

## 2013-11-04 NOTE — Progress Notes (Signed)
Clinical Social Work Department BRIEF PSYCHOSOCIAL ASSESSMENT 11/04/2013  Patient:  Clifford Cook, Clifford Cook     Account Number:  [id number]     Admit date:  10/31/2013  Clinical Social Worker:  Ulyess Blossom  Date/Time:  11/04/2013 01:30 PM  Referred by:  Physician  Date Referred:  11/04/2013 Referred for  SNF Placement   Other Referral:   Interview type:  Patient Other interview type:   and patient son and daughter-in-law at bedside    PSYCHOSOCIAL DATA Living Status:  WIFE Admitted from facility:   Level of care:   Primary support name:  Ronalee Belts Goldin/son/[phone] Primary support relationship to patient:  CHILD, ADULT Degree of support available:   son present at bedside. Pt wife, Rilen Shukla also strong support    CURRENT CONCERNS Current Concerns  Post-Acute Placement   Other Concerns:    SOCIAL WORK ASSESSMENT / PLAN CSW received referral for New SNF.    CSW met with pt, pt son, and pt daughter-in-law at bedside. CSW introduced self and explained role. Pt reports that prior to admission pt lived with pt wife at home. Pt daughter-in-law explained that pt wife and pt daughter are at the house now resting. CSW discussed with pt and pt family PT and MD recommendation for rehab at SNF prior to returning home. Pt expressed understanding and stated that MD had discussed rehab as recommendation. Pt discussed that he is agreeable to exploring short term rehab as an option for discharge. Per pt family, pt is going to have a PICC line for 4 weeks of IV antibiotics. CSW discussed process of SNF search and clarified questions and concerns.    CSW completed FL2 and initiated SNF search to [facility].    CSW to follow up with pt and pt family re: SNF bed offers.    CSW to continue to follow to provide support and assist with pt discharge planning needs.   Assessment/plan status:  Psychosocial Support/Ongoing Assessment of Needs Other assessment/ plan:   discharge planning    Information/referral to community resources:   [facility] list    PATIENT'S/FAMILY'S RESPONSE TO PLAN OF CARE: Pt alert and oriented x 4. Pt family supportive and actively involved in pt care. Pt and pt family were aware of recommendation for rehab prior to returning home. Pt agreeable to exploring option and plans to discuss further with pt wife.    Alison Murray, MSW, [place] Work [phone]

## 2013-11-04 NOTE — Progress Notes (Addendum)
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 CT chest neg for PE, suggest PNA, Labs suggesting AKI, hyponatremia Found on admission to have increasing abd swelling, demand ischemia, due to PNA and strep viridans bacteremia  Past medical history-As per Problem list Chart reviewed as below- reviewed  Consultants:  None yet  Procedures:  Ct scan spine  Ct head  Ct angio chest  Antibiotics:  Primaxin 10/18  Vancomycin 10/18>>10/22  Ciprofloxacin 10/18-10/20   Subjective   Doing fair/ slowly better. Slight SOB and wheezing. BP improved. No n/v/cp currently   Objective    Interim History: Reviewed  Telemetry: Sinus tachycardia 120s   Objective: Filed Vitals:   11/04/13 0900 11/04/13 0941 11/04/13 1000 11/04/13 1100  BP: 103/41  106/39 117/48  Pulse: 103  102 107  Temp: 97.9 F (36.6 C)  97.9 F (36.6 C) 97.9 F (36.6 C)  TempSrc:      Resp: 19  20 40  Height:      Weight:      SpO2: 93% 92% 93% 97%    Intake/Output Summary (Last 24 hours) at 11/04/13 1413 Last data filed at 11/04/13 1100  Gross per 24 hour  Intake 1015.83 ml  Output    570 ml  Net 445.83 ml    Exam:  General: Alert oriented, mild distress due to abd discomfort and SOB Cardiovascular: S1-S2 tachycardic Respiratory: positive wheezing, no crackles Abdomen: Soft distended, anasarca Skin lower extremity edema noted Neuro intact  Data Reviewed: Basic Metabolic Panel:  Recent Labs Lab 10/31/13 1541 11/01/13 0050 11/01/13 0629 11/02/13 0340 11/03/13 0155 11/04/13 0515  NA 123*  --  126* 123* 125* 125*  K 4.9  --  4.9 4.9 5.2 4.6  CL 85*  --  90* 88* 90* 91*  CO2 23  --  24 22 21 20   GLUCOSE 79  --  84 117* 93 113*  BUN 25*  --  27* 39*  47* 50*  CREATININE 1.30 1.40* 1.44* 2.23* 2.88* 2.61*  CALCIUM 7.9*  --  7.3* 7.4* 7.4* 7.7*   Liver Function Tests:  Recent Labs Lab 10/31/13 1541 11/01/13 0629 11/02/13 0340 11/03/13 0155  AST 118* 102* 81* 70*  ALT 49 43 42 41  ALKPHOS 111 89 117 98  BILITOT 4.2* 3.4* 3.2* 3.2*  PROT 5.7* 4.8* 5.0* 5.2*  ALBUMIN 2.1* 1.8* 1.8* 1.8*    Recent Labs Lab 10/31/13 1541  LIPASE 30    Recent Labs Lab 10/31/13 1544 11/04/13 0515  AMMONIA 70* 10*   CBC:  Recent Labs Lab 10/31/13 1541 11/01/13 0050 11/01/13 0629 11/02/13 0340 11/03/13 0155 11/04/13 0515  WBC 19.7* 16.6* 16.6* 15.5* 13.3* 10.0  NEUTROABS 18.1*  --  15.0* 13.2* 10.7*  --   HGB 9.5* 8.5* 8.5* 8.3* 8.8* 8.8*  HCT 27.7* 23.7* 24.1* 23.0* 24.3* 24.9*  MCV 93.6 93.3 91.3 92.0 91.0 91.9  PLT 106* 88* 90* 86* 93* 76*   Cardiac Enzymes:  Recent Labs Lab 11/02/13 1958 11/03/13 0155 11/03/13 0800 11/04/13 0143 11/04/13 0821  TROPONINI 0.31* <0.30 <0.30 <0.30 <0.30   BNP: No components found with this basename: POCBNP,  CBG: No results found for this basename: GLUCAP,  in the last 168 hours  Recent Results (from the past 240 hour(s))  URINE CULTURE  Status: None   Collection Time    10/31/13  7:20 PM      Result Value Ref Range Status   Specimen Description URINE, CLEAN CATCH   Final   Special Requests Immunocompromised   Final   Culture  Setup Time     Final   Value: 11/01/2013 03:13     Performed at [facility]     Final   Value: 7,000 COLONIES/ML     Performed at [facility]   Culture     Final   Value: INSIGNIFICANT GROWTH     Performed at [facility]   Report Status 11/02/2013 FINAL   Final  CULTURE, BLOOD (ROUTINE X 2)     Status: None   Collection Time    10/31/13  8:38 PM      Result Value Ref Range Status   Specimen Description BLOOD L HAND   Final   Special Requests     Final   Value: BOTTLES DRAWN AEROBIC AND ANAEROBIC  IMMUNOCOMPROMISED 5ML EACH   Culture  Setup Time     Final   Value: 11/01/2013 02:11     Performed at [facility]   Culture     Final   Value: VIRIDANS STREPTOCOCCUS     Note: Gram Stain Report Called to,Read Back By and Verified With: LISA FREI ON 11/01/2013 AT 9:28P BY WILEJ     Performed at [facility]   Report Status 11/04/2013 FINAL   Final   Organism ID, Bacteria VIRIDANS STREPTOCOCCUS   Final  CULTURE, BLOOD (ROUTINE X 2)     Status: None   Collection Time    10/31/13  8:40 PM      Result Value Ref Range Status   Specimen Description BLOOD 10ML   Final   Special Requests     Final   Value: BOTTLES DRAWN AEROBIC AND ANAEROBIC IMMUNOCOMPROMISED DRAWN FROM UNK LOCATION   Culture  Setup Time     Final   Value: 11/01/2013 02:11     Performed at [facility]   Culture     Final   Value: VIRIDANS STREPTOCOCCUS     Note: SUSCEPTIBILITIES PERFORMED ON PREVIOUS CULTURE WITHIN THE LAST 5 DAYS.     Note: Culture results may be compromised due to an excessive volume of blood received in culture bottles. Gram Stain Report Called to,Read Back By and Verified With: LISA FREI ON 11/01/2013 AT 9:28P BY Dennard Nip     Performed at [facility]   Report Status 11/04/2013 FINAL   Final  MRSA PCR SCREENING     Status: None   Collection Time    11/01/13 12:06 AM      Result Value Ref Range Status   MRSA by PCR NEGATIVE  NEGATIVE Final   Comment:            The GeneXpert MRSA Assay (FDA     approved for NASAL specimens     only), is one component of a     comprehensive MRSA colonization     surveillance program. It is not     intended to diagnose MRSA     infection nor to guide or     monitor treatment for     MRSA infections.  BODY FLUID CULTURE     Status: None   Collection Time    11/02/13 10:09 AM      Result Value Ref Range Status  Specimen Description FLUID ASCITES   Final   Special Requests Immunocompromised   Final   Gram Stain     Final   Value:  WBC PRESENT,BOTH PMN AND MONONUCLEAR     NO ORGANISMS SEEN     CYTOSPIN Performed by [facility]     Performed at [facility]   Culture     Final   Value: NO GROWTH 2 DAYS     Note: Gram Stain Report Called to,Read Back By and Verified With: S RUSSEL RN AT 1122 ON 10.20.15 BY SHUEA     Performed at [facility]   Report Status PENDING   Incomplete  GRAM STAIN     Status: None   Collection Time    11/02/13 10:09 AM      Result Value Ref Range Status   Specimen Description FLUID ASCITES   Final   Special Requests Immunocompromised   Final   Gram Stain     Final   Value: CYTOSPIN     WBC PRESENT,BOTH PMN AND MONONUCLEAR     NO ORGANISMS SEEN     Gram Stain Report Called to,Read Back By and Verified With: Armandina Stammer RN AT 1122 ON 10.20.15 BY SHUEA   Report Status 11/02/2013 FINAL   Final     Studies:              All Imaging reviewed and is as per above notation   Scheduled Meds: . antiseptic oral rinse  7 mL Mouth Rinse q12n4p  . budesonide (PULMICORT) nebulizer solution  0.25 mg Nebulization BID  . chlorhexidine  15 mL Mouth Rinse BID  . feeding supplement (RESOURCE BREEZE)  1 Container Oral TID BM  . fludrocortisone  0.1 mg Oral BID  . furosemide  20 mg Oral Daily  . imipenem-cilastatin  250 mg Intravenous 4 times per day  . pantoprazole  40 mg Oral Daily  . sodium chloride  3 mL Intravenous Q12H  . sorbitol  20 mL Oral BID   Continuous Infusions: . sodium chloride 20 mL/hr at 11/04/13 0839     Assessment/Plan:  1. Severe sepsis-most likely pneumonia and strep viridans bacteremia (see below). Continue Primaxin and discontinue vancomycin. Afebrile and WBC's trending down. Will continue pulmicort; base on repeated CXR will start low dose lasix. 2. Constitutional hypotension-usual blood pressure lower than 100/50 per family. Pretty much back to his baseline now.  3. Demand ischemia-troponins stable and seen to be trending downward/negative for the  last three sets. Echo shows some WM anomally but poor acoustic windows, same as per contrast Echo. No CP.  4. Positive strep viridans bacteremia: 2/2. Not a good candidate for TEE (too risky); after discussing with ID and given patient allergies, plan is to treat with primaxin initially and then switch to invanz at discharge. Plan is for 4 weeks total. Will repeat Blood cx's on 10/23 5. SBP?- no frank SBP as per fluid analysis. S/P 1.6 L tapped on 10/20; final cx pending 6. Liver disease-Family mentions h/o Hemachromatosis-Ferritin is normal. His GI is Dr. Watt Climes and we will consult [facility] if needed.  For now hold off Aldactone and resume low dose lasix 7. Acute Kidney injury-likely 2/2 to contrast as well as diuretics, vanc and hypotension.  improving. Will follow closely. 8. Hypokalemia-repleted 9. Possible PNA-CXr 2 views confirms infiltrates.  Continue IV primaxin 10. Weakness-likely 2/2 to multiple issues-PT to evaluate and provide recommendations  11. Rib Fracture-continue Toradol only if significant pain, given renal  failure.  Discontinued opiates and Tylenol due to Risk or sedation/worsening liver function.   12. Hyponatremia, hypervolemic-2/2 to third spacing from Ascites and chronic cirrhosis.  Lasix to be resumed today. 13. 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 Stable currently. Follow labs in a.m. 14. Prior GI bleed-discontinue heparin due to high risk for bleeding and start SCDs for prophylaxis. Continue Protonix 40 mg daily  Code Status: Full Family Communication: Discussed with family members at bedside all questions answered Disposition Plan: Keep on step down for now  40 min  [place]  11/04/2013, 2:13 PM    LOS: 4 days

## 2013-11-04 NOTE — Progress Notes (Signed)
Received patient from SDU, alert and oriented. Agree with previous RN's assessment.

## 2013-11-05 DIAGNOSIS — B954 Other streptococcus as the cause of diseases classified elsewhere: Secondary | ICD-10-CM

## 2013-11-05 DIAGNOSIS — R531 Weakness: Secondary | ICD-10-CM

## 2013-11-05 LAB — BASIC METABOLIC PANEL
ANION GAP: 15 (ref 5–15)
BUN: 41 mg/dL — ABNORMAL HIGH (ref 6–23)
CO2: 19 meq/L (ref 19–32)
Calcium: 7.9 mg/dL — ABNORMAL LOW (ref 8.4–10.5)
Chloride: 91 mEq/L — ABNORMAL LOW (ref 96–112)
Creatinine, Ser: 1.97 mg/dL — ABNORMAL HIGH (ref 0.50–1.35)
GFR calc Af Amer: 38 mL/min — ABNORMAL LOW (ref >90)
GFR, EST NON AFRICAN AMERICAN: 33 mL/min — AB (ref >90)
Glucose, Bld: 252 mg/dL — ABNORMAL HIGH (ref 70–99)
Potassium: 4.3 mEq/L (ref 3.7–5.3)
SODIUM: 125 meq/L — AB (ref 137–147)

## 2013-11-05 LAB — BLOOD GAS, ARTERIAL
Acid-base deficit: 3.8 mmol/L — ABNORMAL HIGH (ref 0.0–2.0)
Bicarbonate: 19.4 mEq/L — ABNORMAL LOW (ref 20.0–24.0)
DRAWN BY: 31101
O2 CONTENT: 2 L/min
O2 Saturation: 93.8 %
PATIENT TEMPERATURE: 98.6
PCO2 ART: 30.3 mmHg — AB (ref 35.0–45.0)
TCO2: 18.2 mmol/L (ref 0–100)
pH, Arterial: 7.423 (ref 7.350–7.450)
pO2, Arterial: 71 mmHg — ABNORMAL LOW (ref 80.0–100.0)

## 2013-11-05 LAB — BODY FLUID CULTURE: Culture: NO GROWTH

## 2013-11-05 MED ORDER — SORBITOL 70 % SOLN
20.0000 mL | Freq: Every day | Status: DC
  Filled 2013-11-05: qty 30

## 2013-11-05 MED ORDER — METHYLPREDNISOLONE SODIUM SUCC 125 MG IJ SOLR
60.0000 mg | Freq: Two times a day (BID) | INTRAMUSCULAR | Status: AC
  Administered 2013-11-05 (×2): 60 mg via INTRAVENOUS
  Filled 2013-11-05 (×2): qty 0.96

## 2013-11-05 MED ORDER — FUROSEMIDE 10 MG/ML IJ SOLN
10.0000 mg | Freq: Every day | INTRAMUSCULAR | Status: DC
  Administered 2013-11-05 – 2013-11-06 (×2): 10 mg via INTRAVENOUS
  Filled 2013-11-05: qty 1
  Filled 2013-11-05: qty 2

## 2013-11-05 MED ORDER — METHYLPREDNISOLONE SODIUM SUCC 125 MG IJ SOLR
60.0000 mg | Freq: Three times a day (TID) | INTRAMUSCULAR | Status: DC
  Filled 2013-11-05 (×2): qty 0.96

## 2013-11-05 MED ORDER — DIPHENHYDRAMINE HCL 25 MG PO CAPS
25.0000 mg | ORAL_CAPSULE | Freq: Every evening | ORAL | Status: DC | PRN
  Administered 2013-11-05 – 2013-11-06 (×2): 25 mg via ORAL
  Filled 2013-11-05 (×2): qty 1

## 2013-11-05 MED ORDER — IPRATROPIUM-ALBUTEROL 0.5-2.5 (3) MG/3ML IN SOLN
3.0000 mL | RESPIRATORY_TRACT | Status: DC
Start: 2013-11-05 — End: 2013-11-05

## 2013-11-05 MED ORDER — IPRATROPIUM-ALBUTEROL 0.5-2.5 (3) MG/3ML IN SOLN: 3.0000 mL | RESPIRATORY_TRACT | Status: DC | PRN

## 2013-11-05 MED ORDER — SODIUM CHLORIDE 0.9 % IV SOLN
1.0000 g | INTRAVENOUS | Status: DC
  Administered 2013-11-05 – 2013-11-09 (×5): 1 g via INTRAVENOUS
  Filled 2013-11-05 (×5): qty 1

## 2013-11-05 MED ORDER — ALBUTEROL SULFATE (2.5 MG/3ML) 0.083% IN NEBU: 2.5000 mg | INHALATION_SOLUTION | Freq: Four times a day (QID) | RESPIRATORY_TRACT | Status: DC | PRN

## 2013-11-05 MED ORDER — DIPHENHYDRAMINE HCL 50 MG/ML IJ SOLN
12.5000 mg | Freq: Four times a day (QID) | INTRAMUSCULAR | Status: DC | PRN
  Administered 2013-11-05: 12.5 mg via INTRAVENOUS
  Filled 2013-11-05: qty 1

## 2013-11-05 MED ORDER — GUAIFENESIN ER 600 MG PO TB12
600.0000 mg | ORAL_TABLET | Freq: Two times a day (BID) | ORAL | Status: DC
  Administered 2013-11-05 – 2013-11-09 (×9): 600 mg via ORAL
  Filled 2013-11-05 (×10): qty 1

## 2013-11-05 NOTE — Progress Notes (Signed)
Pt continues to refuse breathing treatments. He stated he has an allergy to the medication and does not want them. RT performed an assessment on him. RT is making him PRN based on the assessment and refusal by Pt.

## 2013-11-05 NOTE — Progress Notes (Signed)
Physical Therapy Treatment Patient Details Name: Clifford Cook MRN: [id number] DOB: Oct 26, 1945 Today's Date: 11-17-2013    History of Present Illness 68 yo male admitted with sepsis, pna, ascites, fall, rib fx, elevated troponin. Hx of liver cirrhosis, GI bleed, lumbar comp fx.     PT Comments    Pt ambulated in hallway then assisted to bathroom prior to back to bed.  Frequent verbal cues for pursed lip breathing with fatigue and SOB.  Pt would benefit from ST-SNF.  Follow Up Recommendations  SNF;Supervision/Assistance - 24 hour     Equipment Recommendations  Rolling walker with 5" wheels    Recommendations for Other Services       Precautions / Restrictions Precautions Precautions: Fall    Mobility  Bed Mobility Overal bed mobility: Needs Assistance Bed Mobility: Sit to Supine;Supine to Sit     Supine to sit: Min assist Sit to supine: Supervision   General bed mobility comments: assist for trunk upright  Transfers Overall transfer level: Needs assistance Equipment used: Rolling walker (2 wheeled) Transfers: Sit to/from Stand Sit to Stand: Min assist         General transfer comment: assist to rise and stabilize, verbal cues for hand placement and safety esp to/from lower toilet  Ambulation/Gait Ambulation/Gait assistance: Min guard Ambulation Distance (Feet): 80 Feet (x2) Assistive device: Rolling walker (2 wheeled) Gait Pattern/deviations: Step-through pattern;Decreased stride length     General Gait Details: pt reports mild dyspnea, required rest break at 80 feet with SpO2 89% and increased to 93% on 2L O2 with cued pursed lip breathing   Stairs            Wheelchair Mobility    Modified Rankin (Stroke Patients Only)       Balance                                    Cognition Arousal/Alertness: Awake/alert Behavior During Therapy: WFL for tasks assessed/performed Overall Cognitive Status: Within Functional Limits for tasks  assessed                      Exercises      General Comments        Pertinent Vitals/Pain Pain Assessment: No/denies pain    Home Living                      Prior Function            PT Goals (current goals can now be found in the care plan section) Progress towards PT goals: Progressing toward goals    Frequency  Min 3X/week    PT Plan Current plan remains appropriate    Co-evaluation             End of Session Equipment Utilized During Treatment: Gait belt;Oxygen Activity Tolerance: Patient tolerated treatment well Patient left: in bed;with call bell/phone within reach;with family/visitor present     Time: 3300-7622 PT Time Calculation (min): 27 min  Charges:  $Gait Training: 23-37 mins                    G Codes:      Naziah Weckerly,KATHrine E 17-Nov-2013, 4:23 PM Carmelia Bake, PT, DPT Nov 17, 2013 Pager: [phone]

## 2013-11-05 NOTE — Progress Notes (Signed)
Pt restless, and SOB/ wheezing. Pt and daughter states this happen after receiving Pulimocort HHN treatment. MD up to patient. O2 sats 98% on 2 leaders. Will cont ti monitor as needed. SRP, RN

## 2013-11-05 NOTE — ED Provider Notes (Signed)
Medical screening examination/treatment/procedure(s) were conducted as a shared visit with non-physician practitioner(s) and myself.  I personally evaluated the patient during the encounter.   EKG Interpretation   Date/Time:  Sunday October 31 2013 20:29:42 EDT Ventricular Rate:  112 PR Interval:  158 QRS Duration: 92 QT Interval:  349 QTC Calculation: 476 R Axis:   -67 Text Interpretation:  Sinus tachycardia Left anterior fascicular block Low  voltage, extremity and precordial leads Probable anteroseptal infarct, old  ED PHYSICIAN INTERPRETATION AVAILABLE IN [place] Confirmed by  TEST, Record ([id number]) on 11/02/2013 6:55:46 AM      Patient presents with generalized weakness and hypoxia from home. He is ill-appearing on exam. He has crackles at bilateral bases and pitting edema in bilateral legs as well as his abdomen. Abdomen is nontender. Labs showed a significant leukocytosis. Him and he is also elevated to improve from prior. Troponin is mildly elevated but he continues to not having chest pain during any of recent illness and has no acute ischemic changes. Given elevated troponin and new onset hypoxia, CTA chest ordered to rule out PE. It was negative for PE but did show bilateral pneumonia which is likely source of patient's sepsis. Systolic blood pressures are borderline despite IV fluid boluses. Patient will be admitted to step down.Ernestina Patches, MD 11/05/13 [phone]

## 2013-11-05 NOTE — Progress Notes (Signed)
ANTIBIOTIC CONSULT NOTE - FOLLOW UP  Pharmacy Consult for Primaxin Indication: sepsis  Allergies  Allergen Reactions  . Pulmicort [Budesonide] Shortness Of Breath    Causes stridor  . Penicillins Other (See Comments)    Rash hives, white spots.    Patient Measurements: Height: 5\' 9"  (175.3 cm) Weight: 232 lb 5.8 oz (105.4 kg) IBW/kg (Calculated) : 70.7  Vital Signs: Temp: 97.8 F (36.6 C) (10/23 0515) Temp Source: Oral (10/23 0515) BP: 85/63 mmHg (10/23 0515) Pulse Rate: 100 (10/23 0515) Intake/Output from previous day: 10/22 0701 - 10/23 0700 In: 1995.8 [I.V.:220.8; IV Piggyback:200] Out: 200 [Urine:200] Intake/Output from this shift: Total I/O In: -  Out: 250 [Urine:250]  Labs:  Recent Labs  11/03/13 0155 11/04/13 0515 11/05/13 1120  WBC 13.3* 10.0  --   HGB 8.8* 8.8*  --   PLT 93* 76*  --   CREATININE 2.88* 2.61* 1.97*   Estimated Creatinine Clearance: 42.3 ml/min (by C-G formula based on Cr of 1.97).  Recent Labs  11/04/13 0515  VANCORANDOM 24.8      Assessment: 68 yoM admitted 10/18 with severe sepsis, likely secondary to pneumonia, however with decompensated liver cirrhosis and abdominal distention, MD cannot rule out SBP at this time. S/p paracentesis 10/20 with 1.6 L removed.  2/2 Blood cultures positive for Viridans Strep.  Pharmacy is consulted to dose empiric Primaxin.  10/18 >> Flagyl x 1 10/18 >> Vancomycin >> 10/22 10/18 >> Cipro >> 10/20 10/19 >> Primaxin >>  Tmax: afebrile WBCs: improved to WNL (10/22) Renal: SCr 2.61 (10/22) with CrCl ~ 32 ml/min.  (likely d/t Echo with contrast, diuresis, hypotension)  10/19 MRSA screen: neg 10/18 blood x 2: 2/2 strep viridans (S-PCN) 10/18 urine: insignificant growth 10/20 ascites fluid cx: ngtd, gram stain shows WBC, no organisms  Goal of Therapy:  Doses adjusted per renal function Eradication of infection  Plan:   Continue Primaxin to 250 mg q6h   F/u culture results, SCr, clinical  course.   Gretta Arab PharmD, BCPS Pager [phone] 11/05/2013 1:34 PM

## 2013-11-05 NOTE — Progress Notes (Addendum)
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 CT chest neg for PE, suggest PNA, Labs suggesting AKI, hyponatremia Found on admission to have increasing abd swelling, demand ischemia, due to PNA and strep viridans bacteremia  Past medical history-As per Problem list Chart reviewed as below- reviewed  Consultants:  None yet  Procedures:  Ct scan spine  Ct head  Ct angio chest  Antibiotics:  Primaxin 10/18>>10/23  Vancomycin 10/18>>10/22  Ciprofloxacin 10/18-10/20  invanz 10/23   Subjective  Doing fair/slowly better. No wheezing. Overnight with slight reaction to use of pulmicort. BP soft but stable No n/v/cp currently   Objective    Interim History: Reviewed  Telemetry: Sinus tachycardia 120s   Objective: Filed Vitals:   11/04/13 1943 11/04/13 1953 11/04/13 2247 11/05/13 0515  BP:   105/58 85/63  Pulse:   103 100  Temp:   97.6 F (36.4 C) 97.8 F (36.6 C)  TempSrc:   Axillary Oral  Resp:   22 22  Height:      Weight: 105.4 kg (232 lb 5.8 oz)     SpO2:  95% 98% 93%    Intake/Output Summary (Last 24 hours) at 11/05/13 1335 Last data filed at 11/05/13 1123  Gross per 24 hour  Intake   1530 ml  Output    250 ml  Net   1280 ml    Exam:  General: Alert oriented, mild distress due to abd discomfort and SOB Cardiovascular: S1-S2 tachycardic Respiratory: positive wheezing, no crackles Abdomen: Soft distended, anasarca Skin lower extremity edema noted Neuro intact  Data Reviewed: Basic Metabolic Panel:  Recent Labs Lab 11/01/13 0629 11/02/13 0340 11/03/13 0155 11/04/13 0515 11/05/13 1120  NA 126* 123* 125* 125* 125*  K 4.9 4.9 5.2 4.6 4.3  CL 90* 88* 90* 91* 91*  CO2 24 22 21 20 19   GLUCOSE 84 117* 93  113* 252*  BUN 27* 39* 47* 50* 41*  CREATININE 1.44* 2.23* 2.88* 2.61* 1.97*  CALCIUM 7.3* 7.4* 7.4* 7.7* 7.9*   Liver Function Tests:  Recent Labs Lab 10/31/13 1541 11/01/13 0629 11/02/13 0340 11/03/13 0155  AST 118* 102* 81* 70*  ALT 49 43 42 41  ALKPHOS 111 89 117 98  BILITOT 4.2* 3.4* 3.2* 3.2*  PROT 5.7* 4.8* 5.0* 5.2*  ALBUMIN 2.1* 1.8* 1.8* 1.8*    Recent Labs Lab 10/31/13 1541  LIPASE 30    Recent Labs Lab 10/31/13 1544 11/04/13 0515  AMMONIA 70* 10*   CBC:  Recent Labs Lab 10/31/13 1541 11/01/13 0050 11/01/13 0629 11/02/13 0340 11/03/13 0155 11/04/13 0515  WBC 19.7* 16.6* 16.6* 15.5* 13.3* 10.0  NEUTROABS 18.1*  --  15.0* 13.2* 10.7*  --   HGB 9.5* 8.5* 8.5* 8.3* 8.8* 8.8*  HCT 27.7* 23.7* 24.1* 23.0* 24.3* 24.9*  MCV 93.6 93.3 91.3 92.0 91.0 91.9  PLT 106* 88* 90* 86* 93* 76*   Cardiac Enzymes:  Recent Labs Lab 11/02/13 1958 11/03/13 0155 11/03/13 0800 11/04/13 0143 11/04/13 0821  TROPONINI 0.31* <0.30 <0.30 <0.30 <0.30   BNP: No components found with this basename: POCBNP,  CBG: No results found for this basename: GLUCAP,  in the last 168 hours  Recent Results (from the past 240 hour(s))  URINE CULTURE     Status: None   Collection Time  10/31/13  7:20 PM      Result Value Ref Range Status   Specimen Description URINE, CLEAN CATCH   Final   Special Requests Immunocompromised   Final   Culture  Setup Time     Final   Value: 11/01/2013 03:13     Performed at [facility]     Final   Value: 7,000 COLONIES/ML     Performed at [facility]   Culture     Final   Value: INSIGNIFICANT GROWTH     Performed at [facility]   Report Status 11/02/2013 FINAL   Final  CULTURE, BLOOD (ROUTINE X 2)     Status: None   Collection Time    10/31/13  8:38 PM      Result Value Ref Range Status   Specimen Description BLOOD L HAND   Final   Special Requests     Final   Value: BOTTLES DRAWN AEROBIC AND  ANAEROBIC IMMUNOCOMPROMISED 5ML EACH   Culture  Setup Time     Final   Value: 11/01/2013 02:11     Performed at [facility]   Culture     Final   Value: VIRIDANS STREPTOCOCCUS     Note: Gram Stain Report Called to,Read Back By and Verified With: LISA FREI ON 11/01/2013 AT 9:28P BY WILEJ     Performed at [facility]   Report Status 11/04/2013 FINAL   Final   Organism ID, Bacteria VIRIDANS STREPTOCOCCUS   Final  CULTURE, BLOOD (ROUTINE X 2)     Status: None   Collection Time    10/31/13  8:40 PM      Result Value Ref Range Status   Specimen Description BLOOD 10ML   Final   Special Requests     Final   Value: BOTTLES DRAWN AEROBIC AND ANAEROBIC IMMUNOCOMPROMISED DRAWN FROM UNK LOCATION   Culture  Setup Time     Final   Value: 11/01/2013 02:11     Performed at [facility]   Culture     Final   Value: VIRIDANS STREPTOCOCCUS     Note: SUSCEPTIBILITIES PERFORMED ON PREVIOUS CULTURE WITHIN THE LAST 5 DAYS.     Note: Culture results may be compromised due to an excessive volume of blood received in culture bottles. Gram Stain Report Called to,Read Back By and Verified With: LISA FREI ON 11/01/2013 AT 9:28P BY Dennard Nip     Performed at [facility]   Report Status 11/04/2013 FINAL   Final  MRSA PCR SCREENING     Status: None   Collection Time    11/01/13 12:06 AM      Result Value Ref Range Status   MRSA by PCR NEGATIVE  NEGATIVE Final   Comment:            The GeneXpert MRSA Assay (FDA     approved for NASAL specimens     only), is one component of a     comprehensive MRSA colonization     surveillance program. It is not     intended to diagnose MRSA     infection nor to guide or     monitor treatment for     MRSA infections.  BODY FLUID CULTURE     Status: None   Collection Time    11/02/13 10:09 AM      Result Value Ref Range Status   Specimen Description FLUID ASCITES   Final  Special Requests Immunocompromised   Final   Gram Stain     Final     Value: WBC PRESENT,BOTH PMN AND MONONUCLEAR     NO ORGANISMS SEEN     CYTOSPIN Performed by [facility]     Performed at [facility]   Culture     Final   Value: NO GROWTH 3 DAYS     Note: Gram Stain Report Called to,Read Back By and Verified With: S RUSSEL RN AT 1122 ON 10.20.15 BY SHUEA     Performed at [facility]   Report Status 11/05/2013 FINAL   Final  GRAM STAIN     Status: None   Collection Time    11/02/13 10:09 AM      Result Value Ref Range Status   Specimen Description FLUID ASCITES   Final   Special Requests Immunocompromised   Final   Gram Stain     Final   Value: CYTOSPIN     WBC PRESENT,BOTH PMN AND MONONUCLEAR     NO ORGANISMS SEEN     Gram Stain Report Called to,Read Back By and Verified With: Armandina Stammer RN AT 1122 ON 10.20.15 BY SHUEA   Report Status 11/02/2013 FINAL   Final     Studies:              All Imaging reviewed and is as per above notation   Scheduled Meds: . antiseptic oral rinse  7 mL Mouth Rinse q12n4p  . chlorhexidine  15 mL Mouth Rinse BID  . ertapenem  1 g Intravenous Q24H  . feeding supplement (RESOURCE BREEZE)  1 Container Oral TID BM  . fludrocortisone  0.1 mg Oral BID  . furosemide  10 mg Intravenous Daily  . guaiFENesin  600 mg Oral BID  . imipenem-cilastatin  250 mg Intravenous 4 times per day  . ipratropium-albuterol  3 mL Nebulization Q4H  . pantoprazole  40 mg Oral Daily  . sodium chloride  3 mL Intravenous Q12H  . [START ON 11/06/2013] sorbitol  20 mL Oral Daily   Continuous Infusions: . sodium chloride 20 mL/hr at 11/04/13 0839     Assessment/Plan:  1. Severe sepsis-most likely pneumonia and strep viridans bacteremia (see below). Continue antibiotics. Now invanz. Afebrile and WBC's WNL. Will discontinue pulmicort; continue lasix. 2. Constitutional hypotension-usual blood pressure in the 100/50 per family. Pretty much back to his baseline now.  3. Demand ischemia-troponins stable and seen to be  trending downward/negative for the last three sets. Echo shows some WM anomally but poor acoustic windows, same as per contrast Echo. No CP.  4. Positive strep viridans bacteremia: 2/2. Not a good candidate for TEE (too risky); after discussing with ID and given patient allergies, plan is to switch to invanz today (reaching discharge hopefully early next week, but short storage on primaxin and less fluid/volume infusion given invanz is once a day). Plan is for 4 weeks total. Will repeat Blood cx's today (10/23) before placing PICC. On day # 5/28 5. SBP?- no frank SBP as per fluid analysis. S/P 1.6 L tapped on 10/20; final cx still pending 6. Liver disease-Family mentions h/o Hemachromatosis-Ferritin is normal. His GI is Dr. Watt Climes and we will consult [facility] if needed.  For now hold off Aldactone and continue low dose lasix 7. Acute Kidney injury-likely 2/2 to contrast as well as diuretics, vanc and hypotension. Continue  improving. Will follow closely. Cr. 1.97 on 10/23 8. Hypokalemia-repleted 9. Possible PNA-CXr 2 views confirms  infiltrates.  Continue abx's; but will switch to Invanz now. Continue flutter valve and PRN nebulizer tx 10. Weakness-likely 2/2 to multiple issues-PT has evaluated patient and recommended SNF; will arrange when ready for discharge 11. Rib Fracture-continue Toradol only if significant pain, given renal failure.  Discontinued opiates and Tylenol due to Risk or sedation/worsening liver function.   12. Hyponatremia, hypervolemic-2/2 to third spacing from Ascites and chronic cirrhosis.  Continue lasix IV. Follow renal function, electrolytes and BP 13. 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 Stable currently. Follow labs in a.m. 14. Prior GI bleed-discontinue heparin due to high risk for bleeding and start SCDs for prophylaxis. Continue Protonix 40 mg daily  Code Status: Full Family Communication: Discussed with family members at bedside all questions answered Disposition  Plan: will need SNF at discharge  63 min  [place] 11/05/2013, 1:35 PM    LOS: 5 days

## 2013-11-05 NOTE — Clinical Social Work Note (Signed)
CSW continues to follow for d/c planning needs. CSW met with patient and family members present in the room. CSW introduced self and explained role. CSW informed patient confidential information would be discussed and requested permission to speak with him privately. Per patient, he is comfortable discussing information with family members present. CSW provided patient with the following bed offers: [place], [facility], [place], [place], [facility], [facility]. Per patient, he was made aware by MD that he would not be discharged until next week Monday or Tuesday. CSW explained importance of discussing bed offers with family and deciding on top 2 facilities.  Per patient, he will discuss with family over the weekend and will have choice for CSW by Monday. CSW to follow tomorrow.  [place], [place] Weekend Clinical Social Worker [phone]

## 2013-11-05 NOTE — Progress Notes (Addendum)
Called by RN.  Patient working to breath after being given pulmicort treatment.  On exam patient with stridor.  Dtr reports she has allergies to pulmicort and albuterol.  Ordered ABG, solumedrol (patient has tolerated Decadron in the past) and benedryl.    Imogene Burn, PA-C [facility] Pager: [phone]  12:21 ABG does not show acidosis.  No Bipap needed.

## 2013-11-06 LAB — BASIC METABOLIC PANEL
Anion gap: 13 (ref 5–15)
BUN: 40 mg/dL — AB (ref 6–23)
CO2: 20 mEq/L (ref 19–32)
Calcium: 7.7 mg/dL — ABNORMAL LOW (ref 8.4–10.5)
Chloride: 94 mEq/L — ABNORMAL LOW (ref 96–112)
Creatinine, Ser: 1.68 mg/dL — ABNORMAL HIGH (ref 0.50–1.35)
GFR, EST AFRICAN AMERICAN: 46 mL/min — AB (ref >90)
GFR, EST NON AFRICAN AMERICAN: 40 mL/min — AB (ref >90)
Glucose, Bld: 193 mg/dL — ABNORMAL HIGH (ref 70–99)
POTASSIUM: 4.1 meq/L (ref 3.7–5.3)
SODIUM: 127 meq/L — AB (ref 137–147)

## 2013-11-06 MED ORDER — FUROSEMIDE 10 MG/ML IJ SOLN
20.0000 mg | Freq: Every day | INTRAMUSCULAR | Status: DC
  Administered 2013-11-06 – 2013-11-07 (×2): 20 mg via INTRAVENOUS
  Filled 2013-11-06 (×2): qty 2

## 2013-11-06 MED ORDER — SORBITOL 70 % SOLN
20.0000 mL | Freq: Every day | Status: DC | PRN
  Filled 2013-11-06: qty 30

## 2013-11-06 NOTE — Progress Notes (Addendum)
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 CT chest neg for PE, suggest PNA, Labs suggesting AKI, hyponatremia Found on admission to have increasing abd swelling, demand ischemia, due to PNA and strep viridans bacteremia  Past medical history-As per Problem list Chart reviewed as below- reviewed  Consultants:  None yet  Procedures:  Ct scan spine  Ct head  Ct angio chest  Antibiotics:  Primaxin 10/18>>10/23  Vancomycin 10/18>>10/22  Ciprofloxacin 10/18-10/20  invanz 10/23   Subjective  Doing fair; afebrile and continue slowly to improve. No wheezing. No n/v/cp currently tolerating diet and feeling stronger   Objective    Interim History: Reviewed  Telemetry: Sinus tachycardia 120s   Objective: Filed Vitals:   11/05/13 0515 11/05/13 1602 11/05/13 2300 11/06/13 0512  BP: 85/63 110/55 111/54 106/50  Pulse: 100 103 102 99  Temp: 97.8 F (36.6 C) 98.1 F (36.7 C) 98 F (36.7 C) 97.9 F (36.6 C)  TempSrc: Oral Oral Oral Oral  Resp: 22 20 20 20   Height:      Weight:      SpO2: 93% 99% 94% 93%    Intake/Output Summary (Last 24 hours) at 11/06/13 1238 Last data filed at 11/06/13 0909  Gross per 24 hour  Intake    390 ml  Output   1210 ml  Net   -820 ml    Exam:  General: Alert oriented, mild distress due to abd discomfort and SOB Cardiovascular: S1-S2 tachycardic Respiratory: positive wheezing, no crackles Abdomen: Soft distended, anasarca Skin lower extremity edema noted Neuro intact  Data Reviewed: Basic Metabolic Panel:  Recent Labs Lab 11/02/13 0340 11/03/13 0155 11/04/13 0515 11/05/13 1120 11/06/13 0433  NA 123* 125* 125* 125* 127*  K 4.9 5.2 4.6 4.3 4.1  CL 88* 90* 91* 91* 94*  CO2 22 21 20  19 20   GLUCOSE 117* 93 113* 252* 193*  BUN 39* 47* 50* 41* 40*  CREATININE 2.23* 2.88* 2.61* 1.97* 1.68*  CALCIUM 7.4* 7.4* 7.7* 7.9* 7.7*   Liver Function Tests:  Recent Labs Lab 10/31/13 1541 11/01/13 0629 11/02/13 0340 11/03/13 0155  AST 118* 102* 81* 70*  ALT 49 43 42 41  ALKPHOS 111 89 117 98  BILITOT 4.2* 3.4* 3.2* 3.2*  PROT 5.7* 4.8* 5.0* 5.2*  ALBUMIN 2.1* 1.8* 1.8* 1.8*    Recent Labs Lab 10/31/13 1541  LIPASE 30    Recent Labs Lab 10/31/13 1544 11/04/13 0515  AMMONIA 70* 10*   CBC:  Recent Labs Lab 10/31/13 1541 11/01/13 0050 11/01/13 0629 11/02/13 0340 11/03/13 0155 11/04/13 0515  WBC 19.7* 16.6* 16.6* 15.5* 13.3* 10.0  NEUTROABS 18.1*  --  15.0* 13.2* 10.7*  --   HGB 9.5* 8.5* 8.5* 8.3* 8.8* 8.8*  HCT 27.7* 23.7* 24.1* 23.0* 24.3* 24.9*  MCV 93.6 93.3 91.3 92.0 91.0 91.9  PLT 106* 88* 90* 86* 93* 76*   Cardiac Enzymes:  Recent Labs Lab 11/02/13 1958 11/03/13 0155 11/03/13 0800 11/04/13 0143 11/04/13 0821  TROPONINI 0.31* <0.30 <0.30 <0.30 <0.30    Recent Results (from the past 240 hour(s))  URINE CULTURE     Status: None   Collection Time    10/31/13  7:20 PM      Result Value Ref Range Status   Specimen Description URINE, [place]  Final   Special Requests Immunocompromised   Final   Culture  Setup Time     Final   Value: 11/01/2013 03:13     Performed at [place]     Final   Value: 7,000 COLONIES/ML     Performed at [facility]   Culture     Final   Value: INSIGNIFICANT GROWTH     Performed at [facility]   Report Status 11/02/2013 FINAL   Final  CULTURE, BLOOD (ROUTINE X 2)     Status: None   Collection Time    10/31/13  8:38 PM      Result Value Ref Range Status   Specimen Description BLOOD L HAND   Final   Special Requests     Final   Value: BOTTLES DRAWN AEROBIC AND ANAEROBIC IMMUNOCOMPROMISED 5ML EACH   Culture  Setup Time     Final   Value: 11/01/2013 02:11      Performed at [facility]   Culture     Final   Value: VIRIDANS STREPTOCOCCUS     Note: Gram Stain Report Called to,Read Back By and Verified With: LISA FREI ON 11/01/2013 AT 9:28P BY WILEJ     Performed at [facility]   Report Status 11/04/2013 FINAL   Final   Organism ID, Bacteria VIRIDANS STREPTOCOCCUS   Final  CULTURE, BLOOD (ROUTINE X 2)     Status: None   Collection Time    10/31/13  8:40 PM      Result Value Ref Range Status   Specimen Description BLOOD 10ML   Final   Special Requests     Final   Value: BOTTLES DRAWN AEROBIC AND ANAEROBIC IMMUNOCOMPROMISED DRAWN FROM UNK LOCATION   Culture  Setup Time     Final   Value: 11/01/2013 02:11     Performed at [facility]   Culture     Final   Value: VIRIDANS STREPTOCOCCUS     Note: SUSCEPTIBILITIES PERFORMED ON PREVIOUS CULTURE WITHIN THE LAST 5 DAYS.     Note: Culture results may be compromised due to an excessive volume of blood received in culture bottles. Gram Stain Report Called to,Read Back By and Verified With: LISA FREI ON 11/01/2013 AT 9:28P BY Dennard Nip     Performed at [facility]   Report Status 11/04/2013 FINAL   Final  MRSA PCR SCREENING     Status: None   Collection Time    11/01/13 12:06 AM      Result Value Ref Range Status   MRSA by PCR NEGATIVE  NEGATIVE Final   Comment:            The GeneXpert MRSA Assay (FDA     approved for NASAL specimens     only), is one component of a     comprehensive MRSA colonization     surveillance program. It is not     intended to diagnose MRSA     infection nor to guide or     monitor treatment for     MRSA infections.  BODY FLUID CULTURE     Status: None   Collection Time    11/02/13 10:09 AM      Result Value Ref Range Status   Specimen Description FLUID ASCITES   Final   Special Requests Immunocompromised   Final   Gram Stain     Final   Value: WBC PRESENT,BOTH PMN AND  MONONUCLEAR     NO ORGANISMS SEEN     CYTOSPIN Performed by  [facility]     Performed at [facility]   Culture     Final   Value: NO GROWTH 3 DAYS     Note: Gram Stain Report Called to,Read Back By and Verified With: S RUSSEL RN AT 1122 ON 10.20.15 BY SHUEA     Performed at [facility]   Report Status 11/05/2013 FINAL   Final  GRAM STAIN     Status: None   Collection Time    11/02/13 10:09 AM      Result Value Ref Range Status   Specimen Description FLUID ASCITES   Final   Special Requests Immunocompromised   Final   Gram Stain     Final   Value: CYTOSPIN     WBC PRESENT,BOTH PMN AND MONONUCLEAR     NO ORGANISMS SEEN     Gram Stain Report Called to,Read Back By and Verified With: Armandina Stammer RN AT 1122 ON 10.20.15 BY SHUEA   Report Status 11/02/2013 FINAL   Final     Studies:              All Imaging reviewed and is as per above notation   Scheduled Meds: . antiseptic oral rinse  7 mL Mouth Rinse q12n4p  . chlorhexidine  15 mL Mouth Rinse BID  . ertapenem  1 g Intravenous Q24H  . feeding supplement (RESOURCE BREEZE)  1 Container Oral TID BM  . fludrocortisone  0.1 mg Oral BID  . furosemide  20 mg Intravenous Daily  . guaiFENesin  600 mg Oral BID  . pantoprazole  40 mg Oral Daily  . sodium chloride  3 mL Intravenous Q12H   Continuous Infusions: . sodium chloride 20 mL/hr at 11/04/13 0839     Assessment/Plan:  1. Severe sepsis-most likely pneumonia and strep viridans bacteremia (see below). Continue antibiotics. Now invanz. Afebrile and WBC's WNL. Will discontinue pulmicort; continue lasix (but will increase to 20mg  daily). 2. Constitutional hypotension-usual blood pressure in the 100/50 per family. Pretty much back to his baseline now.  3. Demand ischemia-troponins stable and seen to be trending downward/negative for the last three sets. Echo shows some WM anomally but poor acoustic windows, same as per contrast Echo. No CP.  4. Positive strep viridans bacteremia: 2/2. Not a good candidate for TEE (too  risky); after discussing with ID and given patient allergies, plan is to switch to invanz today (reaching discharge hopefully early next week, but short storage on primaxin and less fluid/volume infusion given invanz is once a day). Plan is for 4 weeks total. Will repeat Blood cx's today (10/23) before placing PICC. On day # 5/28 of abx's 5. SBP?- no frank SBP as per fluid analysis. S/P 1.6 L tapped on 10/20; final cx still pending 6. Liver disease-Family mentions h/o Hemachromatosis-Ferritin is normal. His GI is Dr. Watt Climes and we will consult [facility] if needed.  For now hold off Aldactone and continue low dose lasix 7. Acute Kidney injury-likely 2/2 to contrast as well as diuretics, vanc and hypotension. Continue  improving. Will follow closely. Cr. 1.6 on 10/24 8. Hypokalemia-repleted 9. Possible PNA-CXr 2 views confirms infiltrates.  Continue abx's; but will switch to Invanz now. Continue flutter valve and PRN nebulizer tx 10. Weakness-likely 2/2 to multiple issues-PT has evaluated patient and recommended SNF; will arrange when ready for discharge 11. Rib Fracture-continue Toradol only if significant pain, given renal  failure.  Discontinued opiates and Tylenol due to Risk or sedation/worsening liver function.   12. Hyponatremia, hypervolemic-2/2 to third spacing from Ascites and chronic cirrhosis.  Continue lasix IV. Follow renal function, electrolytes and BP 13. 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 Stable currently. Follow labs in a.m. 14. Prior GI bleed-discontinue heparin due to high risk for bleeding and start SCDs for prophylaxis. Continue Protonix 40 mg daily  Code Status: Full Family Communication: Discussed with family members at bedside all questions answered Disposition Plan: will need SNF at discharge  [place], [place] [facility] 11/06/2013, 12:38 PM    LOS: 6 days

## 2013-11-07 DIAGNOSIS — J9691 Respiratory failure, unspecified with hypoxia: Secondary | ICD-10-CM

## 2013-11-07 LAB — BASIC METABOLIC PANEL
Anion gap: 11 (ref 5–15)
BUN: 38 mg/dL — AB (ref 6–23)
CO2: 23 mEq/L (ref 19–32)
Calcium: 7.5 mg/dL — ABNORMAL LOW (ref 8.4–10.5)
Chloride: 96 mEq/L (ref 96–112)
Creatinine, Ser: 1.61 mg/dL — ABNORMAL HIGH (ref 0.50–1.35)
GFR calc Af Amer: 49 mL/min — ABNORMAL LOW (ref >90)
GFR, EST NON AFRICAN AMERICAN: 42 mL/min — AB (ref >90)
GLUCOSE: 135 mg/dL — AB (ref 70–99)
Potassium: 3.6 mEq/L — ABNORMAL LOW (ref 3.7–5.3)
SODIUM: 130 meq/L — AB (ref 137–147)

## 2013-11-07 MED ORDER — FUROSEMIDE 10 MG/ML IJ SOLN
20.0000 mg | Freq: Two times a day (BID) | INTRAMUSCULAR | Status: DC
  Administered 2013-11-07 – 2013-11-09 (×4): 20 mg via INTRAVENOUS
  Filled 2013-11-07 (×7): qty 2

## 2013-11-07 MED ORDER — SODIUM CHLORIDE 0.9 % IJ SOLN
10.0000 mL | INTRAMUSCULAR | Status: DC | PRN
  Administered 2013-11-08 – 2013-11-09 (×2): 10 mL

## 2013-11-07 MED ORDER — TEMAZEPAM 7.5 MG PO CAPS
7.5000 mg | ORAL_CAPSULE | Freq: Every evening | ORAL | Status: DC | PRN
  Administered 2013-11-07 – 2013-11-08 (×2): 7.5 mg via ORAL
  Filled 2013-11-07 (×2): qty 1

## 2013-11-07 MED ORDER — POTASSIUM CHLORIDE CRYS ER 20 MEQ PO TBCR
20.0000 meq | EXTENDED_RELEASE_TABLET | Freq: Every day | ORAL | Status: AC
  Administered 2013-11-07 – 2013-11-09 (×3): 20 meq via ORAL
  Filled 2013-11-07 (×3): qty 1

## 2013-11-07 MED ORDER — DIPHENHYDRAMINE HCL 25 MG PO CAPS
25.0000 mg | ORAL_CAPSULE | Freq: Two times a day (BID) | ORAL | Status: DC | PRN
  Administered 2013-11-07 – 2013-11-08 (×2): 25 mg via ORAL
  Filled 2013-11-07 (×2): qty 1

## 2013-11-07 NOTE — Progress Notes (Signed)
Peripherally Inserted Central Catheter/Midline Placement  The IV Nurse has discussed with the patient and/or persons authorized to consent for the patient, the purpose of this procedure and the potential benefits and risks involved with this procedure.  The benefits include less needle sticks, lab draws from the catheter and patient may be discharged home with the catheter.  Risks include, but not limited to, infection, bleeding, blood clot (thrombus formation), and puncture of an artery; nerve damage and irregular heat beat.  Alternatives to this procedure were also discussed.  PICC/Midline Placement Documentation  PICC / Midline Single Lumen 65/53/74 PICC Left Basilic 46 cm 0 cm (Active)  Indication for Insertion or Continuance of Line Home intravenous therapies (PICC only) 11/07/2013  4:45 PM  Exposed Catheter (cm) 0 cm 11/07/2013  4:45 PM  Site Assessment Clean;Dry;Intact 11/07/2013  4:45 PM  Line Status Flushed;Saline locked;Blood return noted 11/07/2013  4:45 PM  Dressing Type Transparent 11/07/2013  4:45 PM  Dressing Status Clean;Dry;Antimicrobial disc in place;Intact 11/07/2013  4:45 PM  Line Care Connections checked and tightened 11/07/2013  4:45 PM  Line Adjustment (NICU/IV Team Only) No 11/07/2013  4:45 PM  Dressing Intervention New dressing 11/07/2013  4:45 PM  Dressing Change Due 11/14/13 11/07/2013  4:45 PM       Rolena Infante 11/07/2013, 4:46 PM

## 2013-11-07 NOTE — Progress Notes (Signed)
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 CT chest neg for PE, suggest PNA, Labs suggesting AKI, hyponatremia Found on admission to have increasing abd swelling, demand ischemia, due to PNA and strep viridans bacteremia  Past medical history-As per Problem list Chart reviewed as below- reviewed  Consultants:  None yet  Procedures:  Ct scan spine  Ct head  Ct angio chest  Antibiotics:  Primaxin 10/18>>10/23  Vancomycin 10/18>>10/22  Ciprofloxacin 10/18-10/20  invanz 10/23   Subjective  Aebrile and continue improving. No n/v/cp currently tolerating diet and feeling stronger. Urine output better and creatinine continue improving.   Objective    Interim History: Reviewed  Telemetry: Sinus tachycardia 120s   Objective: Filed Vitals:   11/06/13 1418 11/06/13 2133 11/07/13 0621 11/07/13 1336  BP: 112/44 103/47 99/47 106/48  Pulse: 108 100 101 94  Temp: 97.6 F (36.4 C) 98 F (36.7 C) 97.8 F (36.6 C) 98 F (36.7 C)  TempSrc: Oral Oral Oral Oral  Resp: 20 22 20 18   Height:      Weight:      SpO2: 95% 93% 93% 93%    Intake/Output Summary (Last 24 hours) at 11/07/13 1356 Last data filed at 11/07/13 1337  Gross per 24 hour  Intake   1010 ml  Output   2145 ml  Net  -1135 ml    Exam:  General: Alert oriented, mild distress due to abd discomfort and SOB Cardiovascular: S1-S2 tachycardic Respiratory: positive wheezing, no crackles Abdomen: Soft distended, anasarca Skin lower extremity edema noted Neuro intact  Data Reviewed: Basic Metabolic Panel:  Recent Labs Lab 11/03/13 0155 11/04/13 0515 11/05/13 1120 11/06/13 0433 11/07/13 0519  NA 125* 125* 125* 127* 130*  K 5.2 4.6 4.3 4.1 3.6*  CL 90* 91* 91*  94* 96  CO2 21 20 19 20 23   GLUCOSE 93 113* 252* 193* 135*  BUN 47* 50* 41* 40* 38*  CREATININE 2.88* 2.61* 1.97* 1.68* 1.61*  CALCIUM 7.4* 7.7* 7.9* 7.7* 7.5*   Liver Function Tests:  Recent Labs Lab 10/31/13 1541 11/01/13 0629 11/02/13 0340 11/03/13 0155  AST 118* 102* 81* 70*  ALT 49 43 42 41  ALKPHOS 111 89 117 98  BILITOT 4.2* 3.4* 3.2* 3.2*  PROT 5.7* 4.8* 5.0* 5.2*  ALBUMIN 2.1* 1.8* 1.8* 1.8*    Recent Labs Lab 10/31/13 1541  LIPASE 30    Recent Labs Lab 10/31/13 1544 11/04/13 0515  AMMONIA 70* 10*   CBC:  Recent Labs Lab 10/31/13 1541 11/01/13 0050 11/01/13 0629 11/02/13 0340 11/03/13 0155 11/04/13 0515  WBC 19.7* 16.6* 16.6* 15.5* 13.3* 10.0  NEUTROABS 18.1*  --  15.0* 13.2* 10.7*  --   HGB 9.5* 8.5* 8.5* 8.3* 8.8* 8.8*  HCT 27.7* 23.7* 24.1* 23.0* 24.3* 24.9*  MCV 93.6 93.3 91.3 92.0 91.0 91.9  PLT 106* 88* 90* 86* 93* 76*   Cardiac Enzymes:  Recent Labs Lab 11/02/13 1958 11/03/13 0155 11/03/13 0800 11/04/13 0143 11/04/13 0821  TROPONINI 0.31* <0.30 <0.30 <0.30 <0.30    Recent Results (from the past 240 hour(s))  URINE CULTURE     Status: None   Collection Time    10/31/13  7:20 PM      Result Value Ref Range Status   Specimen Description URINE, [place]  Final   Special Requests Immunocompromised   Final   Culture  Setup Time     Final   Value: 11/01/2013 03:13     Performed at [facility]     Final   Value: 7,000 COLONIES/ML     Performed at [facility]   Culture     Final   Value: INSIGNIFICANT GROWTH     Performed at [facility]   Report Status 11/02/2013 FINAL   Final  CULTURE, BLOOD (ROUTINE X 2)     Status: None   Collection Time    10/31/13  8:38 PM      Result Value Ref Range Status   Specimen Description BLOOD L HAND   Final   Special Requests     Final   Value: BOTTLES DRAWN AEROBIC AND ANAEROBIC IMMUNOCOMPROMISED 5ML EACH   Culture  Setup Time     Final    Value: 11/01/2013 02:11     Performed at [facility]   Culture     Final   Value: VIRIDANS STREPTOCOCCUS     Note: Gram Stain Report Called to,Read Back By and Verified With: LISA FREI ON 11/01/2013 AT 9:28P BY WILEJ     Performed at [facility]   Report Status 11/04/2013 FINAL   Final   Organism ID, Bacteria VIRIDANS STREPTOCOCCUS   Final  CULTURE, BLOOD (ROUTINE X 2)     Status: None   Collection Time    10/31/13  8:40 PM      Result Value Ref Range Status   Specimen Description BLOOD 10ML   Final   Special Requests     Final   Value: BOTTLES DRAWN AEROBIC AND ANAEROBIC IMMUNOCOMPROMISED DRAWN FROM UNK LOCATION   Culture  Setup Time     Final   Value: 11/01/2013 02:11     Performed at [facility]   Culture     Final   Value: VIRIDANS STREPTOCOCCUS     Note: SUSCEPTIBILITIES PERFORMED ON PREVIOUS CULTURE WITHIN THE LAST 5 DAYS.     Note: Culture results may be compromised due to an excessive volume of blood received in culture bottles. Gram Stain Report Called to,Read Back By and Verified With: LISA FREI ON 11/01/2013 AT 9:28P BY Dennard Nip     Performed at [facility]   Report Status 11/04/2013 FINAL   Final  MRSA PCR SCREENING     Status: None   Collection Time    11/01/13 12:06 AM      Result Value Ref Range Status   MRSA by PCR NEGATIVE  NEGATIVE Final   Comment:            The GeneXpert MRSA Assay (FDA     approved for NASAL specimens     only), is one component of a     comprehensive MRSA colonization     surveillance program. It is not     intended to diagnose MRSA     infection nor to guide or     monitor treatment for     MRSA infections.  BODY FLUID CULTURE     Status: None   Collection Time    11/02/13 10:09 AM      Result Value Ref Range Status   Specimen Description FLUID ASCITES   Final   Special Requests Immunocompromised   Final   Gram Stain     Final   Value: WBC PRESENT,BOTH PMN AND  MONONUCLEAR     NO ORGANISMS SEEN      CYTOSPIN Performed by [facility]     Performed at [facility]   Culture     Final   Value: NO GROWTH 3 DAYS     Note: Gram Stain Report Called to,Read Back By and Verified With: S RUSSEL RN AT 1122 ON 10.20.15 BY SHUEA     Performed at [facility]   Report Status 11/05/2013 FINAL   Final  GRAM STAIN     Status: None   Collection Time    11/02/13 10:09 AM      Result Value Ref Range Status   Specimen Description FLUID ASCITES   Final   Special Requests Immunocompromised   Final   Gram Stain     Final   Value: CYTOSPIN     WBC PRESENT,BOTH PMN AND MONONUCLEAR     NO ORGANISMS SEEN     Gram Stain Report Called to,Read Back By and Verified With: SLenny Pastel RN AT 1122 ON 10.20.15 BY SHUEA   Report Status 11/02/2013 FINAL   Final  CULTURE, BLOOD (ROUTINE X 2)     Status: None   Collection Time    11/05/13 11:20 AM      Result Value Ref Range Status   Specimen Description BLOOD RIGHT ARM   Final   Special Requests BOTTLES DRAWN AEROBIC AND ANAEROBIC 10CC EACH   Final   Culture  Setup Time     Final   Value: 11/05/2013 14:51     Performed at [facility]   Culture     Final   Value:        BLOOD CULTURE RECEIVED NO GROWTH TO DATE CULTURE WILL BE HELD FOR 5 DAYS BEFORE ISSUING A FINAL NEGATIVE REPORT     Performed at [facility]   Report Status PENDING   Incomplete  CULTURE, BLOOD (ROUTINE X 2)     Status: None   Collection Time    11/05/13 11:35 AM      Result Value Ref Range Status   Specimen Description BLOOD RIGHT HAND   Final   Special Requests BOTTLES DRAWN AEROBIC ONLY 5CC   Final   Culture  Setup Time     Final   Value: 11/05/2013 14:51     Performed at [facility]   Culture     Final   Value:        BLOOD CULTURE RECEIVED NO GROWTH TO DATE CULTURE WILL BE HELD FOR 5 DAYS BEFORE ISSUING A FINAL NEGATIVE REPORT     Performed at [facility]   Report Status PENDING   Incomplete     Studies:              All  Imaging reviewed and is as per above notation   Scheduled Meds: . antiseptic oral rinse  7 mL Mouth Rinse q12n4p  . chlorhexidine  15 mL Mouth Rinse BID  . ertapenem  1 g Intravenous Q24H  . feeding supplement (RESOURCE BREEZE)  1 Container Oral TID BM  . fludrocortisone  0.1 mg Oral BID  . furosemide  20 mg Intravenous BID  . guaiFENesin  600 mg Oral BID  . pantoprazole  40 mg Oral Daily  . potassium chloride  20 mEq Oral Daily  . sodium chloride  3 mL Intravenous Q12H   Continuous Infusions: . sodium chloride 10 mL/hr at 11/06/13 1500     Assessment/Plan:  1. Severe sepsis-most likely pneumonia and strep viridans bacteremia (see below). Continue antibiotics. Now invanz. Afebrile and WBC's WNL. Will discontinue pulmicort; continue lasix (but will increase to 20mg  BID). 2. Constitutional hypotension-usual blood pressure in the 100/50 per family. Pretty much back to his baseline now.  3. Demand ischemia-troponins stable and seen to be trending downward/negative for the last three sets. Echo shows some WM anomally but poor acoustic windows, same as per contrast Echo. No CP.  4. Positive strep viridans bacteremia: 2/2. Not a good candidate for TEE (too risky); after discussing with ID and given patient allergies, plan is to switch to invanz today (reaching discharge hopefully early next week, but short storage on primaxin and less fluid/volume infusion given invanz is once a day). Plan is for 4 weeks total. Repeated Blood cx's preliminary w/o growth. Will place PICC today (10/25). On day # 6/28 of antibiotics 5. SBP?- no frank SBP as per fluid analysis. S/P 1.6 L tapped on 10/20; final cx still pending 6. Liver disease-Family mentions h/o Hemachromatosis-Ferritin is normal. His GI is Dr. Watt Climes and we will consult [facility] if needed.  For now hold off Aldactone and continue lasix, will increase to 20mg  BID 7. Acute Kidney injury-likely 2/2 to contrast as well as diuretics, vanc and hypotension.  Continue  improving. Will follow closely. Cr. 1.6 on 10/25 8. Hypokalemia-repleted 9. Possible PNA-CXr 2 views confirms infiltrates.  Continue abx's; but will switch to Invanz now. Continue flutter valve and PRN nebulizer tx 10. Weakness-likely 2/2 to multiple issues-PT has evaluated patient and recommended SNF; will arrange when ready for discharge 11. Rib Fracture- no significant pain. Discontinued opiates and Tylenol due to Risk or sedation/worsening liver function.  12. Hyponatremia, hypervolemic-2/2 to third spacing from Ascites and chronic cirrhosis.  Continue lasix IV. Follow renal function, electrolytes and BP 13. 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 Stable currently. Follow labs in a.m. 14. Prior GI bleed-discontinue heparin due to high risk for bleeding and start SCDs for prophylaxis. Continue Protonix 40 mg daily  Code Status: Full Family Communication: Discussed with family members at bedside all questions answered Disposition Plan: will need SNF at discharge  35 min (50% time dedicated to coordinate care and discussed face to face with patient and family medical problems, further work up and treatment)  [place] Pager [phone] 11/07/2013, 1:56 PM    LOS: 7 days

## 2013-11-08 ENCOUNTER — Inpatient Hospital Stay (HOSPITAL_COMMUNITY): Payer: Medicare Other

## 2013-11-08 LAB — BASIC METABOLIC PANEL
Anion gap: 11 (ref 5–15)
BUN: 35 mg/dL — ABNORMAL HIGH (ref 6–23)
CO2: 24 meq/L (ref 19–32)
Calcium: 7.4 mg/dL — ABNORMAL LOW (ref 8.4–10.5)
Chloride: 96 mEq/L (ref 96–112)
Creatinine, Ser: 1.33 mg/dL (ref 0.50–1.35)
GFR calc Af Amer: 61 mL/min — ABNORMAL LOW (ref >90)
GFR calc non Af Amer: 53 mL/min — ABNORMAL LOW (ref >90)
Glucose, Bld: 136 mg/dL — ABNORMAL HIGH (ref 70–99)
POTASSIUM: 3.4 meq/L — AB (ref 3.7–5.3)
SODIUM: 131 meq/L — AB (ref 137–147)

## 2013-11-08 MED ORDER — ALBUMIN HUMAN 25 % IV SOLN
25.0000 g | Freq: Four times a day (QID) | INTRAVENOUS | Status: AC
  Administered 2013-11-08 – 2013-11-09 (×2): 25 g via INTRAVENOUS
  Filled 2013-11-08 (×2): qty 100

## 2013-11-08 NOTE — Progress Notes (Signed)
CSW continuing to follow for disposition needs.  CSW attempted to follow up with pt and pt family at bedside re: decision for SNF.   Pt currently in procedure and not family present in room.  CSW to follow up with pt at a later time re: SNF.  CSW to continue to follow.  Alison Murray, MSW, [place] Work [phone]

## 2013-11-08 NOTE — Progress Notes (Signed)
Physical Therapy Treatment Patient Details Name: Clifford Cook MRN: [id number] DOB: [birth year] Today's Date: 2013-11-15    History of Present Illness 68 yo male admitted with sepsis, pna, ascites, fall, rib fx, elevated troponin. Hx of liver cirrhosis, GI bleed, lumbar comp fx.     PT Comments    Pt ambulated in hallway then assisted to bathroom prior to back to bed.  Pt also performed a few exercises in supine.  Pt reports paracentesis later today and possible d/c to SNF tomorrow.  Follow Up Recommendations  SNF;Supervision for mobility/OOB     Equipment Recommendations  Rolling walker with 5" wheels    Recommendations for Other Services       Precautions / Restrictions Precautions Precautions: Fall    Mobility  Bed Mobility Overal bed mobility: Needs Assistance Bed Mobility: Sit to Supine;Supine to Sit     Supine to sit: Supervision Sit to supine: Supervision   General bed mobility comments: increased time however no assist required today  Transfers Overall transfer level: Needs assistance Equipment used: None Transfers: Sit to/from Stand Sit to Stand: Min guard         General transfer comment: verbal cues for UE assist  Ambulation/Gait Ambulation/Gait assistance: Min guard Ambulation Distance (Feet): 80 Feet (x2) Assistive device: None (pushsed IV pole) Gait Pattern/deviations: Step-through pattern;Decreased stride length     General Gait Details: pt reports mild dyspnea, required rest break at 80 feet with SpO2 89% room air and increased to 93% with cued pursed lip breathing (no supplemental oxygen used this visit), used IV pole for a little more support, unsteady without UE assist   Stairs            Wheelchair Mobility    Modified Rankin (Stroke Patients Only)       Balance                                    Cognition Arousal/Alertness: Awake/alert Behavior During Therapy: WFL for tasks assessed/performed Overall  Cognitive Status: Within Functional Limits for tasks assessed                      Exercises General Exercises - Lower Extremity Ankle Circles/Pumps: AROM;Both;15 reps;Supine Heel Slides: AROM;Both;10 reps;Supine Hip ABduction/ADduction: AROM;Both;10 reps;Supine Straight Leg Raises: AROM;Both;10 reps;Supine    General Comments        Pertinent Vitals/Pain Pain Assessment: No/denies pain    Home Living                      Prior Function            PT Goals (current goals can now be found in the care plan section) Progress towards PT goals: Progressing toward goals    Frequency  Min 3X/week    PT Plan Current plan remains appropriate    Co-evaluation             End of Session   Activity Tolerance: Patient tolerated treatment well Patient left: in bed;with call bell/phone within reach;with family/visitor present     Time: 1137-1207 PT Time Calculation (min): 30 min  Charges:  $Gait Training: 23-37 mins                    G Codes:      Drusilla Wampole,KATHrine E Nov 15, 2013, 12:55 PM Carmelia Bake, PT, DPT 11-15-13 Pager: [phone]

## 2013-11-08 NOTE — Procedures (Signed)
Successful US guided paracentesis from [facility].  Yielded 4.8L of clear yellow fluid.  No immediate complications.  Pt tolerated well.   Specimen was not sent for labs.  Ascencion Dike PA-C 11/08/2013 2:34 PM

## 2013-11-08 NOTE — Progress Notes (Signed)
CSW followed up with pt at bedside re: SNF decision.   Pt shared that pt and pt family have chosen [facility] for short term rehab.  CSW contacted [facility] and facility states that they currently do not have any male bed availability.   CSW contacted pt wife via telephone. CSW notified pt wife that pt had notified this CSW about choice for [facility] and CSW spoke to [facility] who currently does not have availability. Pt wife asked for re-initiation of SNF search as SNF search was done four days ago. CSW expressed understanding and re-initiated SNF search. CSW discussed with pt wife that MD feels pt will be medically ready for discharge tomorrow. Pt wife asked for CSW to follow up in the morning regarding updated SNF bed offers in order for pt and pt family to make decision.  CSW to follow up with pt and pt family in the morning to discuss bed offers and assist with pt discharge planning needs when medically ready for discharge.  Alison Murray, MSW, LCSW Clinical Social Work [phone] Coverage for [facility], [place]

## 2013-11-08 NOTE — Progress Notes (Addendum)
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 CT chest neg for PE, suggest PNA, Labs suggesting AKI, hyponatremia Found on admission to have increasing abd swelling, demand ischemia, due to PNA and strep viridans bacteremia  Past medical history-As per Problem list Chart reviewed as below- reviewed  Consultants:  None yet  Procedures:  Ct scan spine  Ct head  Ct angio chest  Antibiotics:  Primaxin 10/18>>10/23  Vancomycin 10/18>>10/22  Ciprofloxacin 10/18-10/20  invanz 10/23   Subjective  Aebrile and continue improving. No n/v/cp currently tolerating diet and feeling stronger. Urine output better and creatinine at its best. Paracentesis done today. approx 4.8 liters removed.   Objective    Interim History: Reviewed  Telemetry: Sinus tachycardia 120s   Objective: Filed Vitals:   11/08/13 1405 11/08/13 1410 11/08/13 1419 11/08/13 1502  BP: 94/50 105/42 96/44 85/40   Pulse:    111  Temp:    98 F (36.7 C)  TempSrc:    Oral  Resp:    18  Height:      Weight:      SpO2:    95%    Intake/Output Summary (Last 24 hours) at 11/08/13 1631 Last data filed at 11/08/13 1500  Gross per 24 hour  Intake   1020 ml  Output   1506 ml  Net   -486 ml    Exam:  General: Alert oriented, mild distress due to abd discomfort and slight SOB Cardiovascular: S1-S2 tachycardic Respiratory: positive wheezing, no crackles Abdomen: Soft distended, anasarca Skin lower extremity edema noted Neuro: intact  Data Reviewed: Basic Metabolic Panel:  Recent Labs Lab 11/04/13 0515 11/05/13 1120 11/06/13 0433 11/07/13 0519 11/08/13 0512  NA 125* 125* 127* 130* 131*  K 4.6 4.3 4.1 3.6* 3.4*  CL 91* 91* 94* 96 96  CO2 20 19 20 23 24     GLUCOSE 113* 252* 193* 135* 136*  BUN 50* 41* 40* 38* 35*  CREATININE 2.61* 1.97* 1.68* 1.61* 1.33  CALCIUM 7.7* 7.9* 7.7* 7.5* 7.4*   Liver Function Tests:  Recent Labs Lab 11/02/13 0340 11/03/13 0155  AST 81* 70*  ALT 42 41  ALKPHOS 117 98  BILITOT 3.2* 3.2*  PROT 5.0* 5.2*  ALBUMIN 1.8* 1.8*   No results found for this basename: LIPASE, AMYLASE,  in the last 168 hours  Recent Labs Lab 11/04/13 0515  AMMONIA 10*   CBC:  Recent Labs Lab 11/02/13 0340 11/03/13 0155 11/04/13 0515  WBC 15.5* 13.3* 10.0  NEUTROABS 13.2* 10.7*  --   HGB 8.3* 8.8* 8.8*  HCT 23.0* 24.3* 24.9*  MCV 92.0 91.0 91.9  PLT 86* 93* 76*   Cardiac Enzymes:  Recent Labs Lab 11/02/13 1958 11/03/13 0155 11/03/13 0800 11/04/13 0143 11/04/13 0821  TROPONINI 0.31* <0.30 <0.30 <0.30 <0.30    Recent Results (from the past 240 hour(s))  URINE CULTURE     Status: None   Collection Time    10/31/13  7:20 PM      Result Value Ref Range Status   Specimen Description URINE, CLEAN CATCH   Final   Special Requests Immunocompromised   Final   Culture  Setup Time     Final   Value: 11/01/2013 03:13     Performed at [place]  Final   Value: 7,000 COLONIES/ML     Performed at [facility]   Culture     Final   Value: INSIGNIFICANT GROWTH     Performed at [facility]   Report Status 11/02/2013 FINAL   Final  CULTURE, BLOOD (ROUTINE X 2)     Status: None   Collection Time    10/31/13  8:38 PM      Result Value Ref Range Status   Specimen Description BLOOD L HAND   Final   Special Requests     Final   Value: BOTTLES DRAWN AEROBIC AND ANAEROBIC IMMUNOCOMPROMISED 5ML EACH   Culture  Setup Time     Final   Value: 11/01/2013 02:11     Performed at [facility]   Culture     Final   Value: VIRIDANS STREPTOCOCCUS     Note: Gram Stain Report Called to,Read Back By and Verified With: LISA FREI ON 11/01/2013 AT 9:28P BY WILEJ     Performed at  [facility]   Report Status 11/04/2013 FINAL   Final   Organism ID, Bacteria VIRIDANS STREPTOCOCCUS   Final  CULTURE, BLOOD (ROUTINE X 2)     Status: None   Collection Time    10/31/13  8:40 PM      Result Value Ref Range Status   Specimen Description BLOOD 10ML   Final   Special Requests     Final   Value: BOTTLES DRAWN AEROBIC AND ANAEROBIC IMMUNOCOMPROMISED DRAWN FROM UNK LOCATION   Culture  Setup Time     Final   Value: 11/01/2013 02:11     Performed at [facility]   Culture     Final   Value: VIRIDANS STREPTOCOCCUS     Note: SUSCEPTIBILITIES PERFORMED ON PREVIOUS CULTURE WITHIN THE LAST 5 DAYS.     Note: Culture results may be compromised due to an excessive volume of blood received in culture bottles. Gram Stain Report Called to,Read Back By and Verified With: LISA FREI ON 11/01/2013 AT 9:28P BY Dennard Nip     Performed at [facility]   Report Status 11/04/2013 FINAL   Final  MRSA PCR SCREENING     Status: None   Collection Time    11/01/13 12:06 AM      Result Value Ref Range Status   MRSA by PCR NEGATIVE  NEGATIVE Final   Comment:            The GeneXpert MRSA Assay (FDA     approved for NASAL specimens     only), is one component of a     comprehensive MRSA colonization     surveillance program. It is not     intended to diagnose MRSA     infection nor to guide or     monitor treatment for     MRSA infections.  BODY FLUID CULTURE     Status: None   Collection Time    11/02/13 10:09 AM      Result Value Ref Range Status   Specimen Description FLUID ASCITES   Final   Special Requests Immunocompromised   Final   Gram Stain     Final   Value: WBC PRESENT,BOTH PMN AND MONONUCLEAR     NO ORGANISMS SEEN     CYTOSPIN Performed by [facility]     Performed at [facility]   Culture     Final   Value: NO GROWTH 3 DAYS  Note: Gram Stain Report Called to,Read Back By and Verified With: S RUSSEL RN AT 1122 ON 10.20.15 BY SHUEA      Performed at [facility]   Report Status 11/05/2013 FINAL   Final  GRAM STAIN     Status: None   Collection Time    11/02/13 10:09 AM      Result Value Ref Range Status   Specimen Description FLUID ASCITES   Final   Special Requests Immunocompromised   Final   Gram Stain     Final   Value: CYTOSPIN     WBC PRESENT,BOTH PMN AND MONONUCLEAR     NO ORGANISMS SEEN     Gram Stain Report Called to,Read Back By and Verified With: SLenny Pastel RN AT 1122 ON 10.20.15 BY SHUEA   Report Status 11/02/2013 FINAL   Final  CULTURE, BLOOD (ROUTINE X 2)     Status: None   Collection Time    11/05/13 11:20 AM      Result Value Ref Range Status   Specimen Description BLOOD RIGHT ARM   Final   Special Requests BOTTLES DRAWN AEROBIC AND ANAEROBIC 10CC EACH   Final   Culture  Setup Time     Final   Value: 11/05/2013 14:51     Performed at [facility]   Culture     Final   Value:        BLOOD CULTURE RECEIVED NO GROWTH TO DATE CULTURE WILL BE HELD FOR 5 DAYS BEFORE ISSUING A FINAL NEGATIVE REPORT     Performed at [facility]   Report Status PENDING   Incomplete  CULTURE, BLOOD (ROUTINE X 2)     Status: None   Collection Time    11/05/13 11:35 AM      Result Value Ref Range Status   Specimen Description BLOOD RIGHT HAND   Final   Special Requests BOTTLES DRAWN AEROBIC ONLY 5CC   Final   Culture  Setup Time     Final   Value: 11/05/2013 14:51     Performed at [facility]   Culture     Final   Value:        BLOOD CULTURE RECEIVED NO GROWTH TO DATE CULTURE WILL BE HELD FOR 5 DAYS BEFORE ISSUING A FINAL NEGATIVE REPORT     Performed at [facility]   Report Status PENDING   Incomplete     Studies:              All Imaging reviewed and is as per above notation   Scheduled Meds: . albumin human  25 g Intravenous Q6H  . antiseptic oral rinse  7 mL Mouth Rinse q12n4p  . chlorhexidine  15 mL Mouth Rinse BID  . ertapenem  1 g Intravenous Q24H  . feeding  supplement (RESOURCE BREEZE)  1 Container Oral TID BM  . fludrocortisone  0.1 mg Oral BID  . furosemide  20 mg Intravenous BID  . guaiFENesin  600 mg Oral BID  . pantoprazole  40 mg Oral Daily  . potassium chloride  20 mEq Oral Daily  . sodium chloride  3 mL Intravenous Q12H   Continuous Infusions: . sodium chloride 10 mL/hr at 11/06/13 1500     Assessment/Plan:  1. Severe sepsis-most likely pneumonia and strep viridans bacteremia (see below). Continue antibiotics. Now invanz. Afebrile and WBC's WNL. Continue lasix (20mg  BID). 2. Constitutional hypotension-usual blood pressure in the 100/50 per family. Pretty much back  to his baseline now.  3. Demand ischemia-troponins stable and seen to be trending downward/negative for the last three sets. Echo shows some WM anomally but poor acoustic windows, same as per contrast Echo. No CP.  4. Positive strep viridans bacteremia: 2/2. Not a good candidate for TEE (too risky); after discussing with ID and given patient allergies, plan is to switch to invanz today (reaching discharge hopefully early next week, but short storage on primaxin and less fluid/volume infusion given invanz is once a day). Plan is for 4 weeks total. Repeated Blood cx's preliminary w/o growth. PICC placed on 10/25. On day # 7/28 of antibiotics 5. SBP?- no frank SBP as per fluid analysis. S/P 4.8 L tapped on 10/26; small leakage on tap site; albumin ordered and dressing reinforced.  6. Liver disease-Family mentions h/o Hemachromatosis-Ferritin is normal. His GI is Dr. Watt Climes and we will consult [facility] if needed.  Will resume aldactone at discharge and continue lasix, for now at 20mg  BID IV 7. Acute Kidney injury-likely 2/2 to contrast as well as diuretics, vanc and hypotension. Continue  improving. Will follow closely. Cr. 1.33 on 10/26 8. Hypokalemia-repleted 9. Possible PNA-CXr 2 views confirms infiltrates.  Continue abx's; on Invanz now. Continue flutter valve and PRN nebulizer  tx 10. Weakness-likely 2/2 to multiple issues-PT has evaluated patient and recommended SNF; will arrange when ready for discharge 11. Rib Fracture- no significant pain. Discontinued opiates and Tylenol due to Risk or sedation/worsening liver function.  12. Hyponatremia, hypervolemic-2/2 to third spacing from Ascites and chronic cirrhosis.  Continue lasix IV. Follow renal function, electrolytes and BP. Improving 131 on (10/26) 13. 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 Stable currently. Follow labs in a.m. 14. Prior GI bleed-discontinue heparin due to high risk for bleeding and start SCDs for prophylaxis. Continue Protonix 40 mg daily  Code Status: Full Family Communication: Discussed with family members at bedside all questions answered Disposition Plan: will need SNF at discharge  35 min (50% time dedicated to coordinate care and discussed face to face with patient and family medical problems, further work up and treatment)  Barton Dubois [facility] Pager [phone] 11/08/2013, 4:31 PM    LOS: 8 days

## 2013-11-08 NOTE — Progress Notes (Signed)
Received post paracentesis punctue site LLQ leaking, dressing reinforce.Patient no complaints of pain or discomfort at this time.

## 2013-11-09 ENCOUNTER — Other Ambulatory Visit: Payer: Self-pay | Admitting: *Deleted

## 2013-11-09 DIAGNOSIS — N178 Other acute kidney failure: Secondary | ICD-10-CM

## 2013-11-09 LAB — BASIC METABOLIC PANEL
Anion gap: 9 (ref 5–15)
BUN: 28 mg/dL — ABNORMAL HIGH (ref 6–23)
CHLORIDE: 98 meq/L (ref 96–112)
CO2: 26 meq/L (ref 19–32)
Calcium: 7.3 mg/dL — ABNORMAL LOW (ref 8.4–10.5)
Creatinine, Ser: 1.13 mg/dL (ref 0.50–1.35)
GFR calc Af Amer: 75 mL/min — ABNORMAL LOW (ref >90)
GFR calc non Af Amer: 64 mL/min — ABNORMAL LOW (ref >90)
GLUCOSE: 115 mg/dL — AB (ref 70–99)
Potassium: 3 mEq/L — ABNORMAL LOW (ref 3.7–5.3)
SODIUM: 133 meq/L — AB (ref 137–147)

## 2013-11-09 MED ORDER — FERROUS SULFATE 325 (65 FE) MG PO TABS: 325.0000 mg | ORAL_TABLET | Freq: Two times a day (BID) | ORAL | Status: DC

## 2013-11-09 MED ORDER — SORBITOL 70 % SOLN: 20.0000 mL | Freq: Every day | Status: DC | PRN

## 2013-11-09 MED ORDER — ALENDRONATE SODIUM 70 MG PO TABS: 70.0000 mg | ORAL_TABLET | ORAL | Status: DC

## 2013-11-09 MED ORDER — TEMAZEPAM 7.5 MG PO CAPS: 7.5000 mg | ORAL_CAPSULE | Freq: Every evening | ORAL | Status: AC | PRN

## 2013-11-09 MED ORDER — SPIRONOLACTONE 50 MG PO TABS: 50.0000 mg | ORAL_TABLET | Freq: Every day | ORAL | Status: DC

## 2013-11-09 MED ORDER — TRAMADOL HCL 50 MG PO TABS: 50.0000 mg | ORAL_TABLET | Freq: Three times a day (TID) | ORAL | Status: DC | PRN

## 2013-11-09 MED ORDER — TEMAZEPAM 7.5 MG PO CAPS: 7.5000 mg | ORAL_CAPSULE | Freq: Every evening | ORAL | Status: DC | PRN

## 2013-11-09 MED ORDER — BOOST / RESOURCE BREEZE PO LIQD: 1.0000 | Freq: Three times a day (TID) | ORAL | Status: AC

## 2013-11-09 MED ORDER — HYDROMORPHONE HCL 2 MG PO TABS: ORAL_TABLET | ORAL | Status: DC

## 2013-11-09 MED ORDER — DIPHENHYDRAMINE HCL 25 MG PO CAPS: 25.0000 mg | ORAL_CAPSULE | Freq: Two times a day (BID) | ORAL | Status: AC | PRN

## 2013-11-09 MED ORDER — SODIUM CHLORIDE 0.9 % IV SOLN: 1.0000 g | INTRAVENOUS | Status: AC

## 2013-11-09 MED ORDER — HYDROMORPHONE HCL 2 MG PO TABS: 1.0000 mg | ORAL_TABLET | Freq: Two times a day (BID) | ORAL | Status: DC | PRN

## 2013-11-09 MED ORDER — HEPARIN SOD (PORK) LOCK FLUSH 100 UNIT/ML IV SOLN
250.0000 [IU] | INTRAVENOUS | Status: AC | PRN
  Administered 2013-11-09: 250 [IU]

## 2013-11-09 MED ORDER — FUROSEMIDE 40 MG PO TABS: 40.0000 mg | ORAL_TABLET | Freq: Every day | ORAL | Status: DC

## 2013-11-09 MED ORDER — PANTOPRAZOLE SODIUM 40 MG PO TBEC: 40.0000 mg | DELAYED_RELEASE_TABLET | Freq: Every day | ORAL | Status: AC

## 2013-11-09 NOTE — Discharge Summary (Addendum)
Physician Discharge Summary  Clifford Cook:[id number] DOB: Jan 07, 1946 DOA: 10/31/2013  PCP: [name], MD  Admit date: 10/31/2013 Discharge date: 11/09/2013  Time spent: >30 minutes  Recommendations for Outpatient Follow-up:  -Antibiotics daily until (11/29/13) -BMET in 1 week to follow electrolytes and renal function -CBC/INR in 1 week to follow coagulation, Hgb and platelets -follow up with GI service for further adjustments on his cirrhosis medications  Discharge Diagnoses:  Principal Problem:   Sepsis Active Problems:   Liver cirrhosis   PNA (pneumonia)   Ascites   Elevated troponin   Generalized weakness   Hyponatremia   Anemia of chronic disease with iron deficiency component    Thrombocytopenia   Respiratory failure with hypoxia   Rib fracture   Discharge Condition: stable and improved. Will be discharge to SNF for rehabilitation.  Diet recommendation: low sodium diet (less than 2 gram daily)  Filed Weights   11/01/13 0400 11/03/13 0500 11/04/13 1943  Weight: 101.3 kg (223 lb 5.2 oz) 105.4 kg (232 lb 5.8 oz) 105.4 kg (232 lb 5.8 oz)    History of present illness:  68 y.o. male with a Past Medical History of cirrhosis (likely secondary to [name]), history of upper GI bleed in 2014 who presents today with generalized weakness, worsening edema (legs and abdomen) and SOB. Patient found with PNA and acute renal failure.   Hospital Course:  1. Severe sepsis-most likely due topneumonia and strep viridans bacteremia (see below). Continue antibiotics. Now on invanz. Afebrile and WBC's WNL. Sepsis resolved. 2. Constitutional hypotension-usual blood pressure in the high 90's to100/50 per patient and family report. Pretty much back to his baseline at discharge.  3. Elevated troponin due to [place] ischemia-troponins slightly elevated on admission. But then stabilize and further sets were neg (a total of 3). EKG w/o ischemic changes and no CP. Echo shows some WM anomally  but poor acoustic windows, same as per contrast Echo; per cardiology no need of further workup. 4. Positive strep viridans bacteremia: 2/2. Not a good candidate for TEE (too risky); after discussing with ID and given patient allergies, plan is to switch to invanz today (reaching discharge hopefully early next week, but short storage on primaxin and less fluid/volume infusion given invanz is once a day). Plan is for 4 weeks total. Repeated Blood cx's preliminary w/o growth. PICC placed on 10/25. On day # 8/28 of antibiotics. Last antibiotic day is 11/29/13 5. SBP?- no frank SBP as per fluid analysis on first paracentesis. Patient has a total of 2 paracentesis and total fluid removal of 6.4 L 6. Liver disease-Family mentions h/o Hemachromatosis-Ferritin is normal. His GI is Dr. Watt Climes and we will follow with him as an outpatient. Will resume aldactone at discharge and continue lasix. Patient advised to follow low sodium diet 7. Acute Kidney injury-likely 2/2 to contrast as well as diuretics, vanc and hypotension. Continue improving. Will follow closely. Cr. 1.13 on 10/27 (BMET in 1 week) 8. Hypokalemia-due to ongoing diuresis. Will discharge on maintenance potassium regimen and will resume spironolactone 9. PNA-CXr 2 views confirms infiltrates. Continue abx's; on Invanz now. Continue flutter valve. No fever and normal WBC's 10. Weakness/decontiditioning-PT has evaluated patient and recommended SNF; will discharge to [facility] for rehab and further treatment. 11. Rib Fracture- no significant pain. Will recommend minimize use of narcotics; but will definitely NSAID's and tylenol (given risk for bleeding and cirrhosis).  12. Hyponatremia, hypervolemic-2/2 to third spacing from Ascites and chronic cirrhosis. Continue lasix and spironolactone. Na 133  on (10/27) 13. 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 Stable currently. Follow CBC, PT/INR in 1 week 14. Hx of Prior GI bleed- heparin products  avoided during admission. Continue Protonix 40 mg daily 15. Anemia of chronic disease: with component of iron deficiency from prior GI bleeding. Continue ferrous sulfate. No active bleeding appreciated.   Procedures:  See below for x-ray reports   Paracentesis X 2 (for a total of 6.4 L removed)  Consultations:  IR (for paracentesis)   Discharge Exam: Filed Vitals:   11/09/13 0545  BP: 97/44  Pulse: 105  Temp: 97.7 F (36.5 C)  Resp: 18   General: AAAOX3, denies CP, breathing at its best, no abd pain, no nausea or vomiting. Afebrile   Cardiovascular: S1-S2 tachycardic  Respiratory: scattered rhonchi, no wheezing, no crackles  Abdomen: Soft, slight distended; no tenderness, positive BS  Skin: slight bruises on right arm; no open wounds; left arm with PICC line in place  Neuro: intact   Discharge Instructions You were cared for by a hospitalist during your hospital stay. If you have any questions about your discharge medications or the care you received while you were in the hospital after you are discharged, you can call the unit and asked to speak with the hospitalist on call if the hospitalist that took care of you is not available. Once you are discharged, your primary care physician will handle any further medical issues. Please note that NO REFILLS for any discharge medications will be authorized once you are discharged, as it is imperative that you return to your primary care physician (or establish a relationship with a primary care physician if you do not have one) for your aftercare needs so that they can reassess your need for medications and monitor your lab values.  Discharge Instructions   Diet - low sodium heart healthy    Complete by:  As directed      Discharge instructions    Complete by:  As directed   Take medications as prescribed Follow a low sodium diet (less than 2 grams daily) Fluid restriction (2L daily) Antibiotics daily until (11/29/13) BMET in 1  week to follow electrolytes and renal function          Current Discharge Medication List    START taking these medications   Details  ertapenem 1 g in sodium chloride 0.9 % 50 mL Inject 1 g into the vein daily. To be given for 20 more days (last antibiotic day is 11/6)    feeding supplement, RESOURCE BREEZE, (RESOURCE BREEZE) LIQD Take 1 Container by mouth 3 (three) times daily between meals. Refills: 0    pantoprazole (PROTONIX) 40 MG tablet Take 1 tablet (40 mg total) by mouth daily.    sorbitol 70 % SOLN Take 20 mLs by mouth daily as needed for moderate constipation (goal is 1-2 good BM's daily).    temazepam (RESTORIL) 7.5 MG capsule Take 1 capsule (7.5 mg total) by mouth at bedtime as needed for sleep. Qty: 20 capsule, Refills: 0      CONTINUE these medications which have CHANGED   Details  alendronate (FOSAMAX) 70 MG tablet Take 1 tablet (70 mg total) by mouth once a week. Hold until antibiotic therapy is completed    diphenhydrAMINE (BENADRYL) 25 mg capsule Take 1 capsule (25 mg total) by mouth every 12 (twelve) hours as needed for itching. Qty: 30 capsule, Refills: 0    ferrous sulfate 325 (65 FE) MG tablet Take 1 tablet (325 mg total)  by mouth 2 (two) times daily with a meal. Refills: 3    furosemide (LASIX) 40 MG tablet Take 1 tablet (40 mg total) by mouth daily.    HYDROmorphone (DILAUDID) 2 MG tablet Take 0.5 tablets (1 mg total) by mouth every 12 (twelve) hours as needed for severe pain (pain). Qty: 30 tablet, Refills: 0    spironolactone (ALDACTONE) 50 MG tablet Take 1 tablet (50 mg total) by mouth daily.    traMADol (ULTRAM) 50 MG tablet Take 1 tablet (50 mg total) by mouth every 8 (eight) hours as needed for moderate pain or severe pain (pain). Qty: 30 tablet, Refills: 0      CONTINUE these medications which have NOT CHANGED   Details  Magnesium 250 MG TABS Take 250 mg by mouth daily.    potassium chloride (KLOR-CON) 20 MEQ packet Take 40 mEq by mouth  daily.    Vitamin D, Ergocalciferol, (DRISDOL) 50000 UNITS CAPS capsule Take 50,000 Units by mouth every 7 (seven) days.      STOP taking these medications     diphenhydramine-acetaminophen (TYLENOL PM) 25-500 MG TABS      hydrOXYzine (ATARAX/VISTARIL) 25 MG tablet        Allergies  Allergen Reactions  . Pulmicort [Budesonide] Shortness Of Breath    Causes stridor  . Penicillins Other (See Comments)    Rash hives, white spots.   Follow-up Information   Schedule an appointment as soon as possible for a visit with [name], MD. (after discharge from SNF)    Specialty:  Family Medicine   Contact information:   [place] [address] [phone]       Call [facility], MD. (office to set up follow up appointment)    Specialty:  Gastroenterology   Contact information:   [address] [place] [address] [phone]        The results of significant diagnostics from this hospitalization (including imaging, microbiology, ancillary and laboratory) are listed below for reference.    Significant Diagnostic Studies: Dg Chest 2 View  11/01/2013   CLINICAL DATA:  Shortness of breath, cough, followup pneumonia  EXAM: CHEST  2 VIEW  COMPARISON:  10/31/2013  FINDINGS: Persistent bilateral upper lobe infiltrates are identified stable in appearance from the prior exam. No pneumothorax is noted. The known left rib fracture is less well appreciated on this exam. Bilateral small pleural effusions are noted.  IMPRESSION: Stable bilateral infiltrates. The overall appearance is stable given some technical variations in the imaging.   Electronically Signed   By: Inez Catalina M.D.   On: 11/01/2013 12:02   Dg Ribs Unilateral W/chest Left  10/31/2013   CLINICAL DATA:  Weakness bilateral legs for 3 days, initial evaluation, well left anterior rib pain after a fall yesterday, initial evaluation  EXAM: LEFT RIBS AND CHEST - 3+ VIEW  COMPARISON:  None.   FINDINGS: Minimally displaced fracture lateral left eighth rib. No pneumothorax or effusion. Stable cardiac enlargement of mild to moderate severity. Mild vascular congestion with no edema or consolidation.  IMPRESSION: Left rib fracture   Electronically Signed   By: Skipper Cliche M.D.   On: 10/31/2013 16:58   Dg Cervical Spine 2 Or 3 Views  10/27/2013   CLINICAL DATA:  68 year old male with increased spine pain for 3 days. No known injury. Initial encounter.  EXAM: CERVICAL SPINE - 2-3 VIEW  COMPARISON:  Head CT 01/13/2013.  FINDINGS: Straightening of cervical lordosis. Normal prevertebral soft  tissue contour. Bulky, flowing osteophytes associated with disc space loss from C4-C5 to the cervicothoracic junction. Grossly normal cervicothoracic alignment. C1-C2 alignment and AP alignment within normal limits. Negative lung apices.  IMPRESSION: C4-C5 through C6-C7 chronic disc and endplate degeneration.   Electronically Signed   By: Lars Pinks M.D.   On: 10/27/2013 14:33   Dg Thoracic Spine 2 View  10/31/2013   CLINICAL DATA:  Bilateral leg weakness for the past 2 days. History of lumbar and thoracic spine fractures 2 years ago.  EXAM: THORACIC SPINE - 2 VIEW  COMPARISON:  10/27/2013.  FINDINGS: Stable multilevel degenerative changes including changes of DISH. No fractures or subluxations are seen.  IMPRESSION: Stable degenerative changes.  No fracture or subluxation.   Electronically Signed   By: Enrique Sack M.D.   On: 10/31/2013 17:02   Dg Thoracic Spine 2 View  10/27/2013   CLINICAL DATA:  68 year old male with increased spine pain for 3 days. No known injury. Initial encounter.  EXAM: THORACIC SPINE - 2 VIEW  COMPARISON:  Cervical radiographs from today reported separately. Portable chest radiograph 05/13/2012.  FINDINGS: Normal thoracic segmentation. Bone mineralization is within normal limits. Minimal to mild S-shaped thoracic scoliosis. Otherwise preserved thoracic vertebral height and alignment.  Flowing osteophytes in the mid and lower thoracic spine. Posterior ribs appear intact. Grossly stable visualized thoracic visceral contours.  IMPRESSION: 1.  No acute osseous abnormality identified in the thoracic spine. 2.  Diffuse idiopathic skeletal hyperostosis.   Electronically Signed   By: Lars Pinks M.D.   On: 10/27/2013 14:34   Dg Lumbar Spine 2-3 Views  10/27/2013   CLINICAL DATA:  68 year old male low back pain without sciatica, unspecified back pain laterality. Increased spine pain for 3 days. Lower extremity pain. No known injury. Initial encounter.  EXAM: LUMBAR SPINE - 2-3 VIEW  COMPARISON:  Lumbar radiographs 08/03/2012. Thoracic series from today reported separately.  FINDINGS: Mild rotatory scoliosis in the lumbar spine. Chronic L4 and L1 compression fractures are stable. Vacuum disc phenomena now at L3-L4. Other lumbar levels are stable. Sacral ala and SI joints grossly intact. Aortoiliac calcified atherosclerosis noted. Mild lower lumbar facet hypertrophy appears stable.  IMPRESSION: 1. No acute osseous abnormality identified in the lumbar spine. Chronic L1 and L4 compression fractures. 2. Chronic lumbar disc degeneration. 3.  Aortoiliac calcified atherosclerosis.   Electronically Signed   By: Lars Pinks M.D.   On: 10/27/2013 14:36   Dg Lumbar Spine Complete  10/31/2013   CLINICAL DATA:  Bilateral leg weakness for the past 2 days. History of lumbar and thoracic spine fractures 2 years ago.  EXAM: LUMBAR SPINE - COMPLETE 4+ VIEW  COMPARISON:  10/27/2013.  FINDINGS: Five non-rib-bearing lumbar vertebrae. Previously described L1 and L4 compression fractures are not changed significantly when differences in projection are taken into account. No acute fractures or subluxations. Anterior spur formation at multiple levels.  IMPRESSION: Stable L1 and L4 compression fractures.   Electronically Signed   By: Enrique Sack M.D.   On: 10/31/2013 17:01   Ct Head Wo Contrast  10/31/2013   CLINICAL DATA:   Weakness, status post fall yesterday  EXAM: CT HEAD WITHOUT CONTRAST  CT CERVICAL SPINE WITHOUT CONTRAST  TECHNIQUE: Multidetector CT imaging of the head and cervical spine was performed following the standard protocol without intravenous contrast. Multiplanar CT image reconstructions of the cervical spine were also generated.  COMPARISON:  None.  FINDINGS: CT HEAD FINDINGS  There is no evidence of mass effect, midline  shift, or extra-axial fluid collections. There is no evidence of a space-occupying lesion or intracranial hemorrhage. There is no evidence of a cortical-based area of acute infarction. There is generalized cerebral atrophy.  The ventricles and sulci are appropriate for the patient's age. The basal cisterns are patent.  Visualized portions of the orbits are unremarkable. The visualized portions of the paranasal sinuses and mastoid air cells are unremarkable.  The osseous structures are unremarkable.  CT CERVICAL SPINE FINDINGS  The alignment is anatomic. The vertebral body heights are maintained. There is incomplete fusion of the posterior arch of C1, likely developmental. There is no acute fracture. There is no static listhesis. The prevertebral soft tissues are normal. The intraspinal soft tissues are not fully imaged on this examination due to poor soft tissue contrast, but there is no gross soft tissue abnormality.  There is degenerative disc disease at C5-6 and C6-7 with mild disc space narrowing. There is a mild broad-based disc bulge at C3-4. There is a mild broad-based disc osteophyte complex at C4-5 and C5-6. There is bilateral facet arthropathy at C3-4. There is bilateral facet arthropathy at C4-5 and bilateral uncovertebral degenerative changes with left foraminal narrowing. Bilateral uncovertebral degenerative changes at C5-6 and C6-7 with bilateral foraminal stenosis.  There is a right apical bulla.There is right apical pleural thickening versus a small effusion.  There is left carotid  artery atherosclerosis.  IMPRESSION: 1. No acute intracranial pathology. 2. No acute osseous injury of the cervical spine.   Electronically Signed   By: Kathreen Devoid   On: 10/31/2013 16:18   Ct Angio Chest Pe W/cm &/or Wo Cm  10/31/2013   CLINICAL DATA:  68 year old male with acute hypoxia and tachycardia. Jaundice. GI bleeding and acute blood loss anemia. Initial encounter.  EXAM: CT ANGIOGRAPHY CHEST WITH CONTRAST  TECHNIQUE: Multidetector CT imaging of the chest was performed using the standard protocol during bolus administration of intravenous contrast. Multiplanar CT image reconstructions and MIPs were obtained to evaluate the vascular anatomy.  CONTRAST:  126mL OMNIPAQUE IOHEXOL 350 MG/ML SOLN  COMPARISON:  Chest radiographs 1614 hr the same day and earlier.  FINDINGS: Despite two contrast injections, there is suboptimal contrast bolus timing in the pulmonary arterial tree. Superimposed respiratory motion artifact. No central pulmonary embolus. No hilar pulmonary artery filling defect identified. Enlargement of central pulmonary arteries suggesting pulmonary artery hypertension. No definite more distal pulmonary embolus.  Synechiae in the trachea and mainstem bronchi. Small layering bilateral pleural effusions. Centrilobular emphysema with superimposed ground-glass and reticulonodular multifocal pulmonary opacity in both upper lobes, slightly more extensive on the left. Mild compressive atelectasis in the lung bases.  No pericardial effusion. Distended azygos vein. No mediastinal or hilar lymphadenopathy. No axillary lymphadenopathy.  Gynecomastia.  Nodular cirrhotic liver partially visible. Small volume ascites in the upper abdomen. The stomach appears thick walled. Small mesenteric varices are visible.  No acute osseous abnormality identified.  Review of the MIP images confirms the above findings.  IMPRESSION: 1. Suboptimal study for detection of pulmonary embolus despite two separate contrast bolus  attempts. No acute pulmonary embolus identified. 2. Centrilobular emphysema with multifocal bilateral ground-glass and reticulonodular opacity most compatible with acute bilateral pneumonia. 3. Cirrhosis partially visible. Small volume ascites visible. Small bilateral pleural effusions.   Electronically Signed   By: Lars Pinks M.D.   On: 10/31/2013 20:38   Ct Cervical Spine Wo Contrast  10/31/2013   CLINICAL DATA:  Weakness, status post fall yesterday  EXAM: CT HEAD WITHOUT CONTRAST  CT CERVICAL SPINE WITHOUT CONTRAST  TECHNIQUE: Multidetector CT imaging of the head and cervical spine was performed following the standard protocol without intravenous contrast. Multiplanar CT image reconstructions of the cervical spine were also generated.  COMPARISON:  None.  FINDINGS: CT HEAD FINDINGS  There is no evidence of mass effect, midline shift, or extra-axial fluid collections. There is no evidence of a space-occupying lesion or intracranial hemorrhage. There is no evidence of a cortical-based area of acute infarction. There is generalized cerebral atrophy.  The ventricles and sulci are appropriate for the patient's age. The basal cisterns are patent.  Visualized portions of the orbits are unremarkable. The visualized portions of the paranasal sinuses and mastoid air cells are unremarkable.  The osseous structures are unremarkable.  CT CERVICAL SPINE FINDINGS  The alignment is anatomic. The vertebral body heights are maintained. There is incomplete fusion of the posterior arch of C1, likely developmental. There is no acute fracture. There is no static listhesis. The prevertebral soft tissues are normal. The intraspinal soft tissues are not fully imaged on this examination due to poor soft tissue contrast, but there is no gross soft tissue abnormality.  There is degenerative disc disease at C5-6 and C6-7 with mild disc space narrowing. There is a mild broad-based disc bulge at C3-4. There is a mild broad-based disc  osteophyte complex at C4-5 and C5-6. There is bilateral facet arthropathy at C3-4. There is bilateral facet arthropathy at C4-5 and bilateral uncovertebral degenerative changes with left foraminal narrowing. Bilateral uncovertebral degenerative changes at C5-6 and C6-7 with bilateral foraminal stenosis.  There is a right apical bulla.There is right apical pleural thickening versus a small effusion.  There is left carotid artery atherosclerosis.  IMPRESSION: 1. No acute intracranial pathology. 2. No acute osseous injury of the cervical spine.   Electronically Signed   By: Kathreen Devoid   On: 10/31/2013 16:18   US Abdomen Limited  11/01/2013   CLINICAL DATA:  Jaundice and abdominal swelling  EXAM: US ABDOMEN LIMITED - RIGHT UPPER QUADRANT  COMPARISON:  None.  FINDINGS: Gallbladder:  Gallbladder wall thickening to 6 mm likely related to the underlying ascites. No gallstones identified.  Common bile duct:  Diameter: 6 mm  Liver:  Somewhat nodular in appearance likely related to underlying cirrhosis.  Mild ascites  IMPRESSION: Mild ascites.  Nodularity to the liver likely related to underlying cirrhosis.  Mild gallbladder wall thickening likely related to the underlying ascites.   Electronically Signed   By: Inez Catalina M.D.   On: 11/01/2013 11:40   US Paracentesis  11/08/2013   CLINICAL DATA:  Abdominal distention secondary to ascites. Request therapeutic paracentesis.  EXAM: ULTRASOUND GUIDED PARACENTESIS  COMPARISON:  Previous paracentesis  PROCEDURE: An ultrasound guided paracentesis was thoroughly discussed with the patient and questions answered. The benefits, risks, alternatives and complications were also discussed. The patient understands and wishes to proceed with the procedure. Written consent was obtained.  Ultrasound was performed to localize and mark an adequate pocket of fluid in the left lower quadrant of the abdomen. The area was then prepped and draped in the normal sterile fashion. 1% Lidocaine  was used for local anesthesia. Under ultrasound guidance a 19 gauge Yueh catheter was introduced. Paracentesis was performed. The catheter was removed and a dressing applied.  COMPLICATIONS: None immediate  FINDINGS: A total of approximately 4.8 L of clear yellow fluid was removed. A fluid sample was not sent for laboratory analysis.  IMPRESSION: Successful ultrasound guided paracentesis yielding 4.8  L of ascites.  Read by: Ascencion Dike PA-C   Electronically Signed   By: Aletta Edouard M.D.   On: 11/08/2013 14:35   US Paracentesis  11/02/2013   INDICATION: Cirrhosis, ascites. Request is made for diagnostic and therapeutic paracentesis  EXAM: ULTRASOUND-GUIDED DIAGNOSTIC AND THERAPEUTIC PARACENTESIS  COMPARISON:  None.  MEDICATIONS: None.  COMPLICATIONS: None immediate  TECHNIQUE: Informed written consent was obtained from the patient after a discussion of the risks, benefits and alternatives to treatment. A timeout was performed prior to the initiation of the procedure.  Initial ultrasound scanning demonstrates a small amount of ascites within the right lower abdominal quadrant. The right lower abdomen was prepped and draped in the usual sterile fashion. 1% lidocaine was used for local anesthesia. Under direct ultrasound guidance, a 19 gauge, 10-cm, Yueh catheter was introduced. An ultrasound image was saved for documentation purposed. The paracentesis was performed. The catheter was removed and a dressing was applied. The patient tolerated the procedure well without immediate post procedural complication.  FINDINGS: A total of approximately 1.6 liters of clear, light yellow fluid was removed. Samples were sent to the laboratory as requested by the clinical team.  IMPRESSION: Successful ultrasound-guided diagnostic and therapeutic paracentesis yielding 1.6 liters liters of peritoneal fluid.  Read by: Rowe Robert, PA-C   Electronically Signed   By: Arne Cleveland M.D.   On: 11/02/2013 12:15   Dg Chest Port  1 View  11/04/2013   CLINICAL DATA:  Follow-up of known pneumonia; shortness of breath and cough  EXAM: PORTABLE CHEST - 1 VIEW  COMPARISON:  PA and lateral chest x-ray of November 01, 2013  FINDINGS: The lungs are well-expanded. The interstitial markings are increased bilaterally and more conspicuous today. Areas of near confluence in the mid lung fields are again demonstrated. The hemidiaphragms are obscured. The cardiopericardial silhouette is top-normal in size. The pulmonary vascularity is not clearly engorged. The mediastinum is normal in width. The observed bony thorax exhibits no acute abnormality. The known left eighth rib fracture is faintly visible on the current study.  IMPRESSION: There has been interval worsening of the bilateral interstitial pneumonia. Small bilateral pleural effusions are present today as well.   Electronically Signed   By: David  Martinique   On: 11/04/2013 07:36   US Abdomen Limited Ruq  11/01/2013   CLINICAL DATA:  Abdominal distention, possible ascites  EXAM: LIMITED ABDOMEN ULTRASOUND FOR ASCITES  TECHNIQUE: Limited ultrasound survey for ascites was performed in all four abdominal quadrants.  COMPARISON:  None.  FINDINGS: Very minimal ascites is noted within the abdomen. No sizable pocket to allow for safe paracentesis was identified.  IMPRESSION: Minimal ascites   Electronically Signed   By: Inez Catalina M.D.   On: 11/01/2013 10:45    Microbiology: Recent Results (from the past 240 hour(s))  URINE CULTURE     Status: None   Collection Time    10/31/13  7:20 PM      Result Value Ref Range Status   Specimen Description URINE, CLEAN CATCH   Final   Special Requests Immunocompromised   Final   Culture  Setup Time     Final   Value: 11/01/2013 03:13     Performed at [facility]     Final   Value: 7,000 COLONIES/ML     Performed at [facility]   Culture     Final   Value: INSIGNIFICANT GROWTH     Performed at [facility]    Report Status 11/02/2013 FINAL   Final  CULTURE, BLOOD (ROUTINE X 2)     Status: None   Collection Time    10/31/13  8:38 PM      Result Value Ref Range Status   Specimen Description BLOOD L HAND   Final   Special Requests     Final   Value: BOTTLES DRAWN AEROBIC AND ANAEROBIC IMMUNOCOMPROMISED 5ML EACH   Culture  Setup Time     Final   Value: 11/01/2013 02:11     Performed at [facility]   Culture     Final   Value: VIRIDANS STREPTOCOCCUS     Note: Gram Stain Report Called to,Read Back By and Verified With: LISA FREI ON 11/01/2013 AT 9:28P BY WILEJ     Performed at [facility]   Report Status 11/04/2013 FINAL   Final   Organism ID, Bacteria VIRIDANS STREPTOCOCCUS   Final  CULTURE, BLOOD (ROUTINE X 2)     Status: None   Collection Time    10/31/13  8:40 PM      Result Value Ref Range Status   Specimen Description BLOOD 10ML   Final   Special Requests     Final   Value: BOTTLES DRAWN AEROBIC AND ANAEROBIC IMMUNOCOMPROMISED DRAWN FROM UNK LOCATION   Culture  Setup Time     Final   Value: 11/01/2013 02:11     Performed at [facility]   Culture     Final   Value: VIRIDANS STREPTOCOCCUS     Note: SUSCEPTIBILITIES PERFORMED ON PREVIOUS CULTURE WITHIN THE LAST 5 DAYS.     Note: Culture results may be compromised due to an excessive volume of blood received in culture bottles. Gram Stain Report Called to,Read Back By and Verified With: LISA FREI ON 11/01/2013 AT 9:28P BY Dennard Nip     Performed at [facility]   Report Status 11/04/2013 FINAL   Final  MRSA PCR SCREENING     Status: None   Collection Time    11/01/13 12:06 AM      Result Value Ref Range Status   MRSA by PCR NEGATIVE  NEGATIVE Final   Comment:            The GeneXpert MRSA Assay (FDA     approved for NASAL specimens     only), is one component of a     comprehensive MRSA colonization     surveillance program. It is not     intended to diagnose MRSA     infection nor to guide or      monitor treatment for     MRSA infections.  BODY FLUID CULTURE     Status: None   Collection Time    11/02/13 10:09 AM      Result Value Ref Range Status   Specimen Description FLUID ASCITES   Final   Special Requests Immunocompromised   Final   Gram Stain     Final   Value: WBC PRESENT,BOTH PMN AND MONONUCLEAR     NO ORGANISMS SEEN     CYTOSPIN Performed by [facility]     Performed at [facility]   Culture     Final   Value: NO GROWTH 3 DAYS     Note: Gram Stain Report Called to,Read Back By and Verified With: S RUSSEL RN AT 1122 ON 10.20.15 BY SHUEA     Performed at [facility]  Report Status 11/05/2013 FINAL   Final  GRAM STAIN     Status: None   Collection Time    11/02/13 10:09 AM      Result Value Ref Range Status   Specimen Description FLUID ASCITES   Final   Special Requests Immunocompromised   Final   Gram Stain     Final   Value: CYTOSPIN     WBC PRESENT,BOTH PMN AND MONONUCLEAR     NO ORGANISMS SEEN     Gram Stain Report Called to,Read Back By and Verified With: Armandina Stammer RN AT 1122 ON 10.20.15 BY SHUEA   Report Status 11/02/2013 FINAL   Final  CULTURE, BLOOD (ROUTINE X 2)     Status: None   Collection Time    11/05/13 11:20 AM      Result Value Ref Range Status   Specimen Description BLOOD RIGHT ARM   Final   Special Requests BOTTLES DRAWN AEROBIC AND ANAEROBIC 10CC EACH   Final   Culture  Setup Time     Final   Value: 11/05/2013 14:51     Performed at [facility]   Culture     Final   Value:        BLOOD CULTURE RECEIVED NO GROWTH TO DATE CULTURE WILL BE HELD FOR 5 DAYS BEFORE ISSUING A FINAL NEGATIVE REPORT     Performed at [facility]   Report Status PENDING   Incomplete  CULTURE, BLOOD (ROUTINE X 2)     Status: None   Collection Time    11/05/13 11:35 AM      Result Value Ref Range Status   Specimen Description BLOOD RIGHT HAND   Final   Special Requests BOTTLES DRAWN AEROBIC ONLY 5CC   Final   Culture   Setup Time     Final   Value: 11/05/2013 14:51     Performed at [facility]   Culture     Final   Value:        BLOOD CULTURE RECEIVED NO GROWTH TO DATE CULTURE WILL BE HELD FOR 5 DAYS BEFORE ISSUING A FINAL NEGATIVE REPORT     Performed at [facility]   Report Status PENDING   Incomplete     Labs: Basic Metabolic Panel:  Recent Labs Lab 11/05/13 1120 11/06/13 0433 11/07/13 0519 11/08/13 0512 11/09/13 0457  NA 125* 127* 130* 131* 133*  K 4.3 4.1 3.6* 3.4* 3.0*  CL 91* 94* 96 96 98  CO2 19 20 23 24 26   GLUCOSE 252* 193* 135* 136* 115*  BUN 41* 40* 38* 35* 28*  CREATININE 1.97* 1.68* 1.61* 1.33 1.13  CALCIUM 7.9* 7.7* 7.5* 7.4* 7.3*   Liver Function Tests:  Recent Labs Lab 11/03/13 0155  AST 70*  ALT 41  ALKPHOS 98  BILITOT 3.2*  PROT 5.2*  ALBUMIN 1.8*    Recent Labs Lab 11/04/13 0515  AMMONIA 10*   CBC:  Recent Labs Lab 11/03/13 0155 11/04/13 0515  WBC 13.3* 10.0  NEUTROABS 10.7*  --   HGB 8.8* 8.8*  HCT 24.3* 24.9*  MCV 91.0 91.9  PLT 93* 76*   Cardiac Enzymes:  Recent Labs Lab 11/02/13 1958 11/03/13 0155 11/03/13 0800 11/04/13 0143 11/04/13 0821  TROPONINI 0.31* <0.30 <0.30 <0.30 <0.30    Signed:  Barton Dubois  [facility] 11/09/2013, 11:57 AM

## 2013-11-09 NOTE — Progress Notes (Signed)
CSW continuing to follow for disposition planning.  CSW met with pt, pt wife, and pt daughter at bedside this morning. CSW provided updated SNF bed offers. Pt family was not interested in any offers provided at this time. Pt family expressed interest in [facility].   CSW contacted [facility] regarding pt family interest. [facility] reviewed pt information and offered a bed. Bed available today.  CSW met with pt and pt family at bedside. MD present at this time. MD discussed that pt medically ready for discharge today. Pt family was concerned about SNF placement. CSW shared with pt family that [facility] offered a bed and can accept pt today. Pt and pt family agreeable to [facility].  CSW notified [facility]hab of pt family acceptance of bed offer today. [facility] confirmed that they could accept pt.   CSW to facilitate pt discharge needs this afternoon.  CSW to continue to follow.  Alison Murray, MSW, LCSW Clinical Social Work [phone] Coverage for Raynaldo Opitz, LCSW

## 2013-11-09 NOTE — Progress Notes (Signed)
Discharge to [facility] ,report given to [facility].  D/c with PICC line capped  by IV RN. Patient pick up by [facility].

## 2013-11-09 NOTE — Progress Notes (Addendum)
Pt for discharge to [facility].  CSW facilitated pt discharge needs including contacting facility, faxing pt discharge summary via [facility], discussing with pt, pt wife, and pt daughter at bedside, providing RN phone number to call report, discussing with pt and pt family at bedside, and awaiting to hear from RN about arranging ambulance transport once IV nurse is able to cap pt PICC line.   Addendum 3:00 pm:  CSW received notification from RN that PICC line nurse arrived to cap pt PICC line.  CSW arranged ambulance transport via [place] for pt to [facility].  Pt, pt wife, and pt daughter appreciative of CSW support and assistance surrounding discharge planning.  No further social work needs identified at this time.  CSW signing off.   Alison Murray, MSW, LCSW Clinical Social Work [phone] Coverage for Raynaldo Opitz, LCSW

## 2013-11-09 NOTE — Telephone Encounter (Signed)
[facility] 

## 2013-11-09 NOTE — Telephone Encounter (Signed)
[facility] 

## 2013-11-10 ENCOUNTER — Non-Acute Institutional Stay (SKILLED_NURSING_FACILITY): Payer: Medicare Other | Admitting: Internal Medicine

## 2013-11-10 DIAGNOSIS — R778 Other specified abnormalities of plasma proteins: Secondary | ICD-10-CM

## 2013-11-10 DIAGNOSIS — R188 Other ascites: Secondary | ICD-10-CM

## 2013-11-10 DIAGNOSIS — R531 Weakness: Secondary | ICD-10-CM

## 2013-11-10 DIAGNOSIS — D689 Coagulation defect, unspecified: Secondary | ICD-10-CM

## 2013-11-10 DIAGNOSIS — R7989 Other specified abnormal findings of blood chemistry: Secondary | ICD-10-CM

## 2013-11-10 DIAGNOSIS — S2231XD Fracture of one rib, right side, subsequent encounter for fracture with routine healing: Secondary | ICD-10-CM

## 2013-11-10 DIAGNOSIS — A400 Sepsis due to streptococcus, group A: Secondary | ICD-10-CM

## 2013-11-10 NOTE — Progress Notes (Signed)
MRN: [id number] Name: Clifford Cook  Sex: male Age: 68 y.o. DOB: June 17, 1945  [facility] #: [facility] Facility/Room: 515 Level Of Care: SNF Provider: Inocencio Homes D Emergency Contacts: Extended Emergency Contact Information Primary Emergency Contact: Heffler,Susan Address: [address]          [place], [place] [address] [place]te Litter of [place] Phone: [phone] Relation: Spouse Secondary Emergency Contact: Kelly Splinter Address: [address]          [place], [place] [address] [place] Phone: [phone] Work Phone: [phone] Mobile Phone: [phone] Relation: Daughter  Code Status:FULl   Allergies: Pulmicort and Penicillins  Chief Complaint  Patient presents with  . New Admit To SNF    HPI: Patient is 68 y.o. 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  Past Medical History  Diagnosis Date  . Stomach ulcer   . GI bleed   . Liver disorder   . Cirrhosis   . GERD (gastroesophageal reflux disease)     Past Surgical History  Procedure Laterality Date  . Cataract extraction, bilateral    . Surgery for lazy eye      age 89  . Esophagogastroduodenoscopy N/A 04/16/2012    Procedure: ESOPHAGOGASTRODUODENOSCOPY (EGD);  Surgeon: Jeryl Columbia, MD;  Location: [facility];  Service: Endoscopy;  Laterality: N/A;  . Esophagogastroduodenoscopy N/A 04/17/2012    Procedure: ESOPHAGOGASTRODUODENOSCOPY (EGD);  Surgeon: Jeryl Columbia, MD;  Location: [facility];  Service: Endoscopy;  Laterality: N/A;  . Direct laryngoscopy N/A 07/13/2013    Procedure: DIRECT LARYNGOSCOPY WITH EXCISION TUMOR;  Surgeon: Rozetta Nunnery, MD;  Location: [facility];  Service: ENT;  Laterality: N/A;      Medication List       This list is accurate as of: 11/10/13 11:59 PM.  Always use your most recent med list.               alendronate 70 MG tablet  Commonly known as:   FOSAMAX  Take 1 tablet (70 mg total) by mouth once a week. Hold until antibiotic therapy is completed     diphenhydrAMINE 25 mg capsule  Commonly known as:  BENADRYL  Take 1 capsule (25 mg total) by mouth every 12 (twelve) hours as needed for itching.     ertapenem 1 g in sodium chloride 0.9 % 50 mL  Inject 1 g into the vein daily. To be given for 20 more days (last antibiotic day is 11/6)     feeding supplement (RESOURCE BREEZE) Liqd  Take 1 Container by mouth 3 (three) times daily between meals.     ferrous sulfate 325 (65 FE) MG tablet  Take 1 tablet (325 mg total) by mouth 2 (two) times daily with a meal.     furosemide 40 MG tablet  Commonly known as:  LASIX  Take 1 tablet (40 mg total) by mouth daily.     HYDROmorphone 2 MG tablet  Commonly known as:  DILAUDID  Take 1/2 tablet by mouth every 12 hours as needed for severe pain     Magnesium 250 MG Tabs  Take 250 mg by mouth daily.     pantoprazole 40 MG tablet  Commonly known as:  PROTONIX  Take 1 tablet (40 mg total) by mouth daily.     potassium chloride 20 MEQ packet  Commonly known as:  KLOR-CON  Take 40 mEq by mouth daily.  sorbitol 70 % Soln  Take 20 mLs by mouth daily as needed for moderate constipation (goal is 1-2 good BM's daily).     spironolactone 50 MG tablet  Commonly known as:  ALDACTONE  Take 1 tablet (50 mg total) by mouth daily.     temazepam 7.5 MG capsule  Commonly known as:  RESTORIL  Take 1 capsule (7.5 mg total) by mouth at bedtime as needed for sleep.     traMADol 50 MG tablet  Commonly known as:  ULTRAM  Take 1 tablet (50 mg total) by mouth every 8 (eight) hours as needed for moderate pain or severe pain (pain).     Vitamin D (Ergocalciferol) 50000 UNITS Caps capsule  Commonly known as:  DRISDOL  Take 50,000 Units by mouth every Wednesday.        No orders of the defined types were placed in this encounter.     There is no immunization history on file for this  patient.  History  Substance Use Topics  . Smoking status: Current Every Day Smoker -- 1.00 packs/day    Types: Cigarettes  . Smokeless tobacco: Never Used  . Alcohol Use: No     Comment: over i yr    Family history is noncontributory    Review of Systems  DATA OBTAINED: from patient GENERAL:  no fevers, fatigue, appetite changes SKIN: No itching, rash or wounds EYES: No eye pain, redness, discharge EARS: No earache, tinnitus, change in hearing NOSE: No congestion, drainage or bleeding  MOUTH/THROAT: No mouth or tooth pain, No sore throat RESPIRATORY: No cough, wheezing, no inc SOB CARDIAC: No chest pain, palpitations, +lower extremity edema  GI: No abdominal pain, No N/V/D or constipation, No heartburn or reflux  GU: No dysuria, frequency or urgency, or incontinence  MUSCULOSKELETAL: No unrelieved bone/joint pain NEUROLOGIC: No headache, dizziness PSYCHIATRIC: No overt anxiety or sadness, No behavior issue.   There were no vitals filed for this visit.  Physical Exam  GENERAL APPEARANCE: Alert, conversant,  No acute distress.  SKIN: No diaphoresis rash HEAD: Normocephalic, atraumatic  EYES: Conjunctiva/lids clear. Pupils round, reactive. EOMs intact.  EARS: External exam WNL, canals clear. Hearing grossly normal.  NOSE: No deformity or discharge.  MOUTH/THROAT: Lips w/o lesions  RESPIRATORY: Breathing is even, unlabored. Lung sounds are slt squeak on L , no rales   CARDIOVASCULAR: Heart RRR no murmurs, rubs or gallops. 1+ peripheral edema.   GASTROINTESTINAL: Abdomen is soft, non-tender, not distended w/ normal bowel sounds. GENITOURINARY: Bladder non tender, not distended  MUSCULOSKELETAL: No abnormal joints or musculature NEUROLOGIC:  Cranial nerves 2-12 grossly intact. Moves all extremities  PSYCHIATRIC: Mood and affect appropriate to situation, no behavioral issues  Patient Active Problem List   Diagnosis Date Noted  . Coagulopathy 11/12/2013  . General  weakness 11/12/2013  . Bleeding from PICC line 11/11/2013  . Hypokalemia 11/11/2013  . Sepsis 10/31/2013  . Liver cirrhosis secondary to NASH 10/31/2013  . Pneumonia due to streptococcus, group A 10/31/2013  . Ascites 10/31/2013  . Elevated troponin 10/31/2013  . Generalized weakness 10/31/2013  . Hyponatremia 10/31/2013  . Anemia 10/31/2013  . Thrombocytopenia 10/31/2013  . Respiratory failure with hypoxia 10/31/2013  . Rib fracture 10/31/2013  . H/O: GI bleed 04/16/2012  . Tobacco abuse 04/16/2012    CBC    Component Value Date/Time   WBC 8.8 11/11/2013 2257   RBC 2.79* 11/11/2013 2257   HGB 8.9* 11/11/2013 2257   HCT 26.1* 11/11/2013 2257   PLT  93* 11/11/2013 2257   MCV 93.5 11/11/2013 2257   LYMPHSABS 0.8 11/11/2013 2257   MONOABS 1.1* 11/11/2013 2257   EOSABS 0.4 11/11/2013 2257   BASOSABS 0.0 11/11/2013 2257    CMP     Component Value Date/Time   NA 135* 11/11/2013 2257   K 3.5* 11/11/2013 2257   CL 101 11/11/2013 2257   CO2 24 11/11/2013 2257   GLUCOSE 130* 11/11/2013 2257   BUN 18 11/11/2013 2257   CREATININE 0.92 11/11/2013 2257   CALCIUM 7.2* 11/11/2013 2257   PROT 5.1* 11/11/2013 2257   ALBUMIN 1.9* 11/11/2013 2257   AST 51* 11/11/2013 2257   ALT 40 11/11/2013 2257   ALKPHOS 151* 11/11/2013 2257   BILITOT 2.1* 11/11/2013 2257   GFRNONAA 84* 11/11/2013 2257   GFRAA >90 11/11/2013 2257    Assessment and Plan  Sepsis most likely due to pneumonia and strep viridans bacteremia (see below). Continue antibiotics. Now on invanz. Afebrile and WBC's WNL.  Positive strep viridans bacteremia: 2/2. Not a good candidate for TEE (too risky); after discussing with ID and given patient allergies, plan is to switch to invanz today (reaching discharge hopefully early next week, but short storage on primaxin and less fluid/volume infusion given invanz is once a day). Plan is for 4 weeks total. Repeated Blood cx's preliminary w/o growth. PICC placed on 10/25. On day #  8/28 of antibiotics. Last antibiotic day is 11/29/13   Pneumonia due to streptococcus, group A Considered 2/2 strep viridans;CXr 2 views confirms infiltrates. Continue abx's; on Invanz now. Continue flutter valve. No fever and normal WBC's   Ascites no frank SBP as per fluid analysis on first paracentesis. Patient has a total of 2 paracentesis and total fluid removal of 6.4 L   Liver cirrhosis secondary to NASH 2/2 NASH;will need periodic paracentesis;-Family mentions h/o Hemachromatosis-Ferritin is normal. His GI is Dr. Watt Climes and we will follow with him as an outpatient. Will resume aldactone at discharge and continue lasix. 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 Stable currently. Follow CBC, PT/INR in 1 week   H/O: GI bleed - heparin products avoided during admission. Continue Protonix 40 mg daily   Rib fracture no significant pain. Will recommend minimize use of narcotics; but will definitely NSAID's and tylenol (given risk for bleeding and cirrhosis   General weakness PT has evaluated patient and recommended SNF; will discharge to [facility] for rehab and further treatment   Elevated troponin troponin due to [place] ischemia-troponins slightly elevated on admission. But then stabilize and further sets were neg (a total of 3). EKG w/o ischemic changes and no CP. Echo shows some WM anomally but poor acoustic windows, same as per contrast Echo; per cardiology no need of further workup.     Hennie Duos, MD

## 2013-11-11 ENCOUNTER — Non-Acute Institutional Stay (SKILLED_NURSING_FACILITY): Payer: Medicare Other | Admitting: Internal Medicine

## 2013-11-11 ENCOUNTER — Emergency Department (HOSPITAL_COMMUNITY)
Admission: EM | Admit: 2013-11-11 | Discharge: 2013-11-12 | Disposition: A | Discharge status: Home / Self Care | Payer: Medicare Other | Attending: Emergency Medicine | Admitting: Emergency Medicine

## 2013-11-11 ENCOUNTER — Encounter: Payer: Self-pay | Admitting: Internal Medicine

## 2013-11-11 ENCOUNTER — Encounter (HOSPITAL_COMMUNITY): Payer: Self-pay | Admitting: Emergency Medicine

## 2013-11-11 DIAGNOSIS — T82838A Hemorrhage of vascular prosthetic devices, implants and grafts, initial encounter: Secondary | ICD-10-CM | POA: Insufficient documentation

## 2013-11-11 DIAGNOSIS — Z79899 Other long term (current) drug therapy: Secondary | ICD-10-CM | POA: Diagnosis not present

## 2013-11-11 DIAGNOSIS — K7460 Unspecified cirrhosis of liver: Secondary | ICD-10-CM | POA: Insufficient documentation

## 2013-11-11 DIAGNOSIS — Y832 Surgical operation with anastomosis, bypass or graft as the cause of abnormal reaction of the patient, or of later complication, without mention of misadventure at the time of the procedure: Secondary | ICD-10-CM | POA: Insufficient documentation

## 2013-11-11 DIAGNOSIS — R188 Other ascites: Secondary | ICD-10-CM

## 2013-11-11 DIAGNOSIS — Z8619 Personal history of other infectious and parasitic diseases: Secondary | ICD-10-CM | POA: Insufficient documentation

## 2013-11-11 DIAGNOSIS — K219 Gastro-esophageal reflux disease without esophagitis: Secondary | ICD-10-CM | POA: Insufficient documentation

## 2013-11-11 DIAGNOSIS — Z880 Allergy status to penicillin: Secondary | ICD-10-CM | POA: Insufficient documentation

## 2013-11-11 DIAGNOSIS — E876 Hypokalemia: Secondary | ICD-10-CM | POA: Insufficient documentation

## 2013-11-11 DIAGNOSIS — Z720 Tobacco use: Secondary | ICD-10-CM | POA: Insufficient documentation

## 2013-11-11 LAB — COMPREHENSIVE METABOLIC PANEL
ALBUMIN: 1.9 g/dL — AB (ref 3.5–5.2)
ALK PHOS: 151 U/L — AB (ref 39–117)
ALT: 40 U/L (ref 0–53)
AST: 51 U/L — ABNORMAL HIGH (ref 0–37)
Anion gap: 10 (ref 5–15)
BUN: 18 mg/dL (ref 6–23)
CHLORIDE: 101 meq/L (ref 96–112)
CO2: 24 mEq/L (ref 19–32)
Calcium: 7.2 mg/dL — ABNORMAL LOW (ref 8.4–10.5)
Creatinine, Ser: 0.92 mg/dL (ref 0.50–1.35)
GFR calc Af Amer: >90 mL/min (ref >90)
GFR calc non Af Amer: 84 mL/min — ABNORMAL LOW (ref >90)
Glucose, Bld: 130 mg/dL — ABNORMAL HIGH (ref 70–99)
POTASSIUM: 3.5 meq/L — AB (ref 3.7–5.3)
Sodium: 135 mEq/L — ABNORMAL LOW (ref 137–147)
Total Bilirubin: 2.1 mg/dL — ABNORMAL HIGH (ref 0.3–1.2)
Total Protein: 5.1 g/dL — ABNORMAL LOW (ref 6.0–8.3)

## 2013-11-11 LAB — CULTURE, BLOOD (ROUTINE X 2)
CULTURE: NO GROWTH
Culture: NO GROWTH

## 2013-11-11 LAB — PROTIME-INR
INR: 1.77 — ABNORMAL HIGH (ref 0.00–1.49)
Prothrombin Time: 20.8 seconds — ABNORMAL HIGH (ref 11.6–15.2)

## 2013-11-11 LAB — CBC WITH DIFFERENTIAL/PLATELET
BASOS PCT: 0 % (ref 0–1)
Basophils Absolute: 0 10*3/uL (ref 0.0–0.1)
EOS ABS: 0.4 10*3/uL (ref 0.0–0.7)
Eosinophils Relative: 5 % (ref 0–5)
HCT: 26.1 % — ABNORMAL LOW (ref 39.0–52.0)
Hemoglobin: 8.9 g/dL — ABNORMAL LOW (ref 13.0–17.0)
Lymphocytes Relative: 9 % — ABNORMAL LOW (ref 12–46)
Lymphs Abs: 0.8 10*3/uL (ref 0.7–4.0)
MCH: 31.9 pg (ref 26.0–34.0)
MCHC: 34.1 g/dL (ref 30.0–36.0)
MCV: 93.5 fL (ref 78.0–100.0)
Monocytes Absolute: 1.1 10*3/uL — ABNORMAL HIGH (ref 0.1–1.0)
Monocytes Relative: 13 % — ABNORMAL HIGH (ref 3–12)
NEUTROS ABS: 6.4 10*3/uL (ref 1.7–7.7)
NEUTROS PCT: 73 % (ref 43–77)
PLATELETS: 93 10*3/uL — AB (ref 150–400)
RBC: 2.79 MIL/uL — ABNORMAL LOW (ref 4.22–5.81)
RDW: 18.6 % — ABNORMAL HIGH (ref 11.5–15.5)
WBC: 8.8 10*3/uL (ref 4.0–10.5)

## 2013-11-11 LAB — APTT: APTT: 40 s — AB (ref 24–37)

## 2013-11-11 NOTE — ED Notes (Signed)
Bed: WA02 Expected date: 11/11/13 Expected time: 9:16 PM Means of arrival: Ambulance Comments: EMS  pic line difficutly

## 2013-11-11 NOTE — Discharge Instructions (Signed)
Read the information below.  You may return to the Emergency Department at any time for worsening condition or any new symptoms that concern you.  If you develop fever, uncontrolled pain, uncontrolled bleeding, chest pain, shortness of breath return to the ED for a recheck.      PICC Home Guide A peripherally inserted central catheter (PICC) is a long, thin, flexible tube that is inserted into a vein in the upper arm. It is a form of intravenous (IV) access. It is considered to be a "central" line because the tip of the PICC ends in a large vein in your chest. This large vein is called the superior vena cava (SVC). The PICC tip ends in the SVC because there is a lot of blood flow in the SVC. This allows medicines and IV fluids to be quickly distributed throughout the body. The PICC is inserted using a sterile technique by a specially trained nurse or physician. After the PICC is inserted, a chest X-ray exam is done to be sure it is in the correct place.  A PICC may be placed for different reasons, such as:  To give medicines and liquid nutrition that can only be given through a central line. Examples are:  Certain antibiotic treatments.  Chemotherapy.  Total parenteral nutrition (TPN).  To take frequent blood samples.  To give IV fluids and blood products.  If there is difficulty placing a peripheral intravenous (PIV) catheter. If taken care of properly, a PICC can remain in place for several months. A PICC can also allow a person to go home from the hospital early. Medicine and PICC care can be managed at home by a family member or home health care team. Orange A PICC? Problems with a PICC can occasionally occur. These may include the following:  A blood clot (thrombus) forming in or at the tip of the PICC. This can cause the PICC to become clogged. A clot-dissolving medicine called tissue plasminogen activator (tPA) can be given through the PICC to help break  up the clot.  Inflammation of the vein (phlebitis) in which the PICC is placed. Signs of inflammation may include redness, pain at the insertion site, red streaks, or being able to feel a "cord" in the vein where the PICC is located.  Infection in the PICC or at the insertion site. Signs of infection may include fever, chills, redness, swelling, or pus drainage from the PICC insertion site.  PICC movement (malposition). The PICC tip may move from its original position due to excessive physical activity, forceful coughing, sneezing, or vomiting.  A break or cut in the PICC. It is important to not use scissors near the PICC.  Nerve or tendon irritation or injury during PICC insertion. WHAT SHOULD I KEEP IN MIND ABOUT ACTIVITIES WHEN I HAVE A PICC?  You may bend your arm and move it freely. If your PICC is near or at the bend of your elbow, avoid activity with repeated motion at the elbow.  Rest at home for the remainder of the day following PICC line insertion.  Avoid lifting heavy objects as instructed by your health care provider.  Avoid using a crutch with the arm on the same side as your PICC. You may need to use a walker. WHAT SHOULD I KNOW ABOUT MY PICC DRESSING?  Keep your PICC bandage (dressing) clean and dry to prevent infection.  Ask your health care provider when you may shower. Ask your health care  provider to teach you how to wrap the PICC when you do take a shower.  Change the PICC dressing as instructed by your health care provider.  Change your PICC dressing if it becomes loose or wet. WHAT SHOULD I KNOW ABOUT PICC CARE?  Check the PICC insertion site daily for leakage, redness, swelling, or pain.  Do not take a bath, swim, or use hot tubs when you have a PICC. Cover PICC line with clear plastic wrap and tape to keep it dry while showering.  Flush the PICC as directed by your health care provider. Let your health care provider know right away if the PICC is difficult  to flush or does not flush. Do not use force to flush the PICC.  Do not use a syringe that is less than 10 mL to flush the PICC.  Never pull or tug on the PICC.  Avoid blood pressure checks on the arm with the PICC.  Keep your PICC identification card with you at all times.  Do not take the PICC out yourself. Only a trained clinical professional should remove the PICC. SEEK IMMEDIATE MEDICAL CARE IF:  Your PICC is accidentally pulled all the way out. If this happens, cover the insertion site with a bandage or gauze dressing. Do not throw the PICC away. Your health care provider will need to inspect it.  Your PICC was tugged or pulled and has partially come out. Do not  push the PICC back in.  There is any type of drainage, redness, or swelling where the PICC enters the skin.  You cannot flush the PICC, it is difficult to flush, or the PICC leaks around the insertion site when it is flushed.  You hear a "flushing" sound when the PICC is flushed.  You have pain, discomfort, or numbness in your arm, shoulder, or jaw on the same side as the PICC.  You feel your heart "racing" or skipping beats.  You notice a hole or tear in the PICC.  You develop chills or a fever. MAKE SURE YOU:   Understand these instructions.  Will watch your condition.  Will get help right away if you are not doing well or get worse. Document Released: 07/07/2002 Document Revised: 05/17/2013 Document Reviewed: 09/07/2012 [facility] Patient Information 2015 [place], [place]. This information is not intended to replace advice given to you by your health care provider. Make sure you discuss any questions you have with your health care provider.

## 2013-11-11 NOTE — ED Notes (Signed)
Pt presents from [place] with a pic line on the left arm, scant blood drainage from the hub, states was put on Monday 10/26, denies pain, denies being blood thinners. Aox4, NAD.

## 2013-11-11 NOTE — ED Provider Notes (Signed)
CSN: [id number]     Arrival date & time 11/11/13  2127 History   First MD Initiated Contact with Patient 11/11/13 2136     Chief Complaint  Patient presents with  . Pic Line Malfunction      (Consider location/radiation/quality/duration/timing/severity/associated sxs/prior Treatment) HPI  Patient brought in for malfunctioning PICC line.  D/C from the hospital two days ago after admission for sepsis, thought to be from pneumonia and strep viridans bacteremia.  D/C to rehab facility to have continued infusions of Invanz.  To be on this until 11/19/13. Last infusion this morning.  The PICC line is infusing but the concern is that it has been bleeding.  Pt noticed this tonight when he was getting ready for bed.  States he has been feeling fine, SOB is improving, not having fevers.  No chest pain.  No swelling or paresthesia or pain with the LUE where the PICC line is placed. Has hx cirrhosis, abdominal swelling is at baseline.  Slight increase in lower extremity peripheral edema since hospitalization and IVF given for renal failure.   Past Medical History  Diagnosis Date  . Stomach ulcer   . GI bleed   . Liver disorder   . Cirrhosis   . GERD (gastroesophageal reflux disease)    Past Surgical History  Procedure Laterality Date  . Cataract extraction, bilateral    . Surgery for lazy eye      age 24  . Esophagogastroduodenoscopy N/A 04/16/2012    Procedure: ESOPHAGOGASTRODUODENOSCOPY (EGD);  Surgeon: Jeryl Columbia, MD;  Location: [facility];  Service: Endoscopy;  Laterality: N/A;  . Esophagogastroduodenoscopy N/A 04/17/2012    Procedure: ESOPHAGOGASTRODUODENOSCOPY (EGD);  Surgeon: Jeryl Columbia, MD;  Location: [facility];  Service: Endoscopy;  Laterality: N/A;  . Direct laryngoscopy N/A 07/13/2013    Procedure: DIRECT LARYNGOSCOPY WITH EXCISION TUMOR;  Surgeon: Rozetta Nunnery, MD;  Location: [facility];  Service: ENT;  Laterality: N/A;   History reviewed. No pertinent  family history. History  Substance Use Topics  . Smoking status: Current Every Day Smoker -- 1.00 packs/day    Types: Cigarettes  . Smokeless tobacco: Never Used  . Alcohol Use: No     Comment: over i yr    Review of Systems  All other systems reviewed and are negative.     Allergies  Pulmicort and Penicillins  Home Medications   Prior to Admission medications   Medication Sig Start Date End Date Taking? Authorizing Provider  alendronate (FOSAMAX) 70 MG tablet Take 1 tablet (70 mg total) by mouth once a week. Hold until antibiotic therapy is completed 11/09/13  Yes Barton Dubois, MD  diphenhydrAMINE (BENADRYL) 25 mg capsule Take 1 capsule (25 mg total) by mouth every 12 (twelve) hours as needed for itching. 11/09/13  Yes Barton Dubois, MD  ertapenem 1 g in sodium chloride 0.9 % 50 mL Inject 1 g into the vein daily. To be given for 20 more days (last antibiotic day is 11/6) 11/09/13 11/29/13 Yes Barton Dubois, MD  feeding supplement, RESOURCE BREEZE, (RESOURCE BREEZE) LIQD Take 1 Container by mouth 3 (three) times daily between meals. 11/09/13  Yes Barton Dubois, MD  ferrous sulfate 325 (65 FE) MG tablet Take 1 tablet (325 mg total) by mouth 2 (two) times daily with a meal. 11/09/13  Yes Barton Dubois, MD  furosemide (LASIX) 40 MG tablet Take 1 tablet (40 mg total) by mouth daily. 11/09/13  Yes Barton Dubois, MD  HYDROmorphone (DILAUDID) 2 MG tablet  Take 1-2 mg by mouth every 12 (twelve) hours as needed for severe pain.   Yes Historical Provider, MD  Magnesium 250 MG TABS Take 250 mg by mouth daily.   Yes Historical Provider, MD  pantoprazole (PROTONIX) 40 MG tablet Take 1 tablet (40 mg total) by mouth daily. 11/09/13  Yes Barton Dubois, MD  potassium chloride 20 MEQ/15ML (10%) solution Take 40 mEq by mouth daily.   Yes Historical Provider, MD  sorbitol 70 % SOLN Take 20 mLs by mouth daily as needed for moderate constipation (goal is 1-2 good BM's daily). 11/09/13  Yes Barton Dubois,  MD  spironolactone (ALDACTONE) 50 MG tablet Take 1 tablet (50 mg total) by mouth daily. 11/09/13  Yes Barton Dubois, MD  temazepam (RESTORIL) 7.5 MG capsule Take 1 capsule (7.5 mg total) by mouth at bedtime as needed for sleep. 11/09/13  Yes Mahima Bubba Camp, MD  traMADol (ULTRAM) 50 MG tablet Take 1 tablet (50 mg total) by mouth every 8 (eight) hours as needed for moderate pain or severe pain (pain). 11/09/13  Yes Blanchie Serve, MD  Vitamin D, Ergocalciferol, (DRISDOL) 50000 UNITS CAPS capsule Take 50,000 Units by mouth every Wednesday.    Yes Historical Provider, MD   BP 115/58  Pulse 112  Temp(Src) 98.2 F (36.8 C) (Oral)  Resp 18  SpO2 96% Physical Exam  Nursing note and vitals reviewed. Constitutional: He appears well-developed and well-nourished. No distress.  HENT:  Head: Normocephalic and atraumatic.  Neck: Neck supple.  Cardiovascular: Normal rate and regular rhythm.   Left upper extremity PICC line with dark red blood within bandages.    Pulmonary/Chest: Effort normal and breath sounds normal. No respiratory distress. He has no wheezes. He has no rales.  Abdominal: Soft. He exhibits distension. There is no tenderness. There is no rebound and no guarding.  Musculoskeletal: He exhibits edema (pitting edema of lower extremities).  LUE without edema, erythema, warmth.  Distal pulses and sensation intact.  Dark blood within LUE picc line tegaderm only.  No apparent active bleeding, no bright red blood.    Neurological: He is alert. He exhibits normal muscle tone.  Skin: He is not diaphoretic.  Psychiatric: He has a normal mood and affect. His behavior is normal.    ED Course  Procedures (including critical care time) Labs Review Labs Reviewed  CBC WITH DIFFERENTIAL - Abnormal; Notable for the following:    RBC 2.79 (*)    Hemoglobin 8.9 (*)    HCT 26.1 (*)    RDW 18.6 (*)    Platelets 93 (*)    Lymphocytes Relative 9 (*)    Monocytes Relative 13 (*)    Monocytes Absolute  1.1 (*)    All other components within normal limits  COMPREHENSIVE METABOLIC PANEL - Abnormal; Notable for the following:    Sodium 135 (*)    Potassium 3.5 (*)    Glucose, Bld 130 (*)    Calcium 7.2 (*)    Total Protein 5.1 (*)    Albumin 1.9 (*)    AST 51 (*)    Alkaline Phosphatase 151 (*)    Total Bilirubin 2.1 (*)    GFR calc non Af Amer 84 (*)    All other components within normal limits  PROTIME-INR - Abnormal; Notable for the following:    Prothrombin Time 20.8 (*)    INR 1.77 (*)    All other components within normal limits  APTT - Abnormal; Notable for the following:    aPTT  40 (*)    All other components within normal limits    Imaging Review No results found.   EKG Interpretation None      10:55 PM Discussed pt Dr Doy Mince  11:47 PM PICC dressing changed by nursing and no further bleeding noted.  Labs approximately baseline.  Pt continues to feel well, ready for d/c home.   MDM   Final diagnoses:  Bleeding from PICC line, initial encounter  Cirrhosis of liver with ascites, unspecified hepatic cirrhosis type    Afebrile nontoxic patient with recent history for sepsis, discharged two days ago to rehab.  Has PICC for abx infusion.  PICC line bleeding earlier tonight, stopped and no further bleeding in ED.  Labs largely unchanged. Pt does have coagulopathy due to cirrhosis.  Pt feeling well and otherwise no concerns.  PICC line has otherwise been functioning properly.  No pain, no e/o infection of PICC line.  Pt d/c home with close PCP follow up.  Discussed result, findings, treatment, and follow up  with patient.  Pt given return precautions.  Pt verbalizes understanding and agrees with plan.       Clayton Bibles, PA-C 11/11/13 2354

## 2013-11-11 NOTE — ED Provider Notes (Signed)
Medical screening examination/treatment/procedure(s) were conducted as a shared visit with non-physician practitioner(s) and myself.  I personally evaluated the patient during the encounter.   EKG Interpretation None      68 year old male who presents with bleeding from his PICC line on his left arm.  He has no other complaints, including no pain, fevers, numbness, weakness, or tingling. On exam, chronically ill appearing, nontoxic, not distressed, normal respiratory effort, normal perfusion, left arm with PICC line in place, no active bleeding, no drainage, no tenderness, NV intact distally.  Labs at baseline. He appears stable for discharge back to his rehabilitation facility.  Clinical Impression: 1. Bleeding from PICC line, initial encounter   2. Cirrhosis of liver with ascites, unspecified hepatic cirrhosis type       Artis Delay, MD 11/12/13 [phone]

## 2013-11-11 NOTE — Progress Notes (Signed)
Patient ID: Clifford Cook, male   DOB: 08-08-1945, 68 y.o.   MRN: [id number]   This is an acute visit.  Level of care skilled.  [facility].  Chief complaint-acute visit secondary to bleeding PICC line site.  History of present illness.  Patient is a pleasant 68 year old male who was recently admitted here for rehabilitation and IV antibiotic with a history of sepsis likely due to pneumonia and strep viridans bacteremia.  Apparently earlier this evening he developed significant bleeding at the PICC line site-he does not complain of any chest pain dizziness vital signs are relatively stable he does have some history of mild tachycardia which appears to be the case this evening as well as somewhat low blood pressures.  .  Family medical social history is been reviewed per discharge summary on 11/09/2013-again principal diagnosis is sepsis he does have a history of liver cirrhosis pneumonia ascites as well as anemia and hyponatremia and thrombocytopenia.  Medications have been reviewed per [facility] he is on Invanz through November 16  Review of systems.  In general is not complaining of any fever or chills.  Respiratory does not complain of shortness of breath.  Cardiac does not complain of chest pain.  Skin-again has developed some bleeding at his left upper arm PICC line site.  Neurologic is not complaining of dizziness or headache.  GI does not complain of abdominal pain does have a history of cirrhosis and ascites.  M- skeletal does not complain of joint pain.  Psych is not complaining of anxiety or depression appears to be in good spirits  Physical exam.  T- 97.7 pulse 106 respirations 18 blood pressure 96/56.  General this is a pleasant elderly male in no distress sitting comfortably on the side of the bed    .  His skin is warm and dry--there is some blood appreciated around his PICC line site this certainly does not appear to be uncontrolled bleeding-- still  appears to be somewhat significant  Radial pulse is intact on affected arm.  Chest is clear to auscultation with somewhat shallow air entry no labored breathing.  Heart is regular rate and rhythm slightly tachycardic  He has-2-3 plus lower extremity edema.  His abdomen is soft protuberant with positive bowel sounds.  Neurologic is grossly intact to speech is clear.  Psych he is alert and oriented pleasant and appropriate   Labs.  11/09/2013.  Sodium 133 potassium 3 BUN 28 creatinine 1.13.  11/04/2013.  WBC 10.0 hemoglobin 8.8 platelets 76,000.   Assessment and plan.  #1-history of bleeding around PICC line-we will sent to the ER for evaluation I suspect PICC line will need replacement as well as careful removal of the previous PICC line-clinically  he does not appear to   Be  unstable certainly.  #2 hypokalemia-it looks like this is being supplemented with potassium 40 mEq a day-when he returns we'll need lab work done again this may be done in the ER as well but will have to keep updated on this.  He is on numerous diuretics including Lasix 40 mg a day spironolactone 50 mg a day again does have history of liver cirrhosis with ascites which I suspect accounts for the edema that we see.--Do note he is on potassium and spironolactone and hopefully this will help with his potassium-we'll have to update metabolic panel tomorrow   #3 hypotension-this appears to be chronic he does not appear to be overtly symptomatic and per review of discharge summary apparently this  is nothing new in his baseline.  [id number] greater than 25 minutes spent in assessing patient-reviewing his medical records-and coordinating plan of care-of note greater than 50% of time spent coordinating plan of care        .

## 2013-11-12 ENCOUNTER — Other Ambulatory Visit: Payer: Self-pay | Admitting: Internal Medicine

## 2013-11-12 ENCOUNTER — Encounter: Payer: Self-pay | Admitting: Internal Medicine

## 2013-11-12 DIAGNOSIS — R188 Other ascites: Secondary | ICD-10-CM

## 2013-11-12 DIAGNOSIS — R531 Weakness: Secondary | ICD-10-CM | POA: Insufficient documentation

## 2013-11-12 DIAGNOSIS — D689 Coagulation defect, unspecified: Secondary | ICD-10-CM | POA: Insufficient documentation

## 2013-11-12 NOTE — Assessment & Plan Note (Signed)
-   heparin products avoided during admission. Continue Protonix 40 mg daily

## 2013-11-12 NOTE — Assessment & Plan Note (Signed)
no significant pain. Will recommend minimize use of narcotics; but will definitely NSAID's and tylenol (given risk for bleeding and cirrhosis

## 2013-11-12 NOTE — Assessment & Plan Note (Signed)
PT has evaluated patient and recommended SNF; will discharge to [facility] for rehab and further treatment

## 2013-11-12 NOTE — Assessment & Plan Note (Signed)
Considered 2/2 strep viridans;CXr 2 views confirms infiltrates. Continue abx's; on Invanz now. Continue flutter valve. No fever and normal WBC's

## 2013-11-12 NOTE — Assessment & Plan Note (Signed)
troponin due to Demand ischemia-troponins slightly elevated on admission. But then stabilize and further sets were neg (a total of 3). EKG w/o ischemic changes and no CP. Echo shows some WM anomally but poor acoustic windows, same as per contrast Echo; per cardiology no need of further workup.

## 2013-11-12 NOTE — Assessment & Plan Note (Addendum)
most likely due to pneumonia and strep viridans bacteremia (see below). Continue antibiotics. Now on invanz. Afebrile and WBC's WNL.  Positive strep viridans bacteremia: 2/2. Not a good candidate for TEE (too risky); after discussing with ID and given patient allergies, plan is to switch to invanz today (reaching discharge hopefully early next week, but short storage on primaxin and less fluid/volume infusion given invanz is once a day). Plan is for 4 weeks total. Repeated Blood cx's preliminary w/o growth. PICC placed on 10/25. On day # 8/28 of antibiotics. Last antibiotic day is 11/29/13

## 2013-11-12 NOTE — Assessment & Plan Note (Signed)
no frank SBP as per fluid analysis on first paracentesis. Patient has a total of 2 paracentesis and total fluid removal of 6.4 L

## 2013-11-12 NOTE — ED Provider Notes (Signed)
Medical screening examination/treatment/procedure(s) were conducted as a shared visit with non-physician practitioner(s) and myself.  I personally evaluated the patient during the encounter.   EKG Interpretation None        Artis Delay, MD 11/12/13 0003

## 2013-11-12 NOTE — Assessment & Plan Note (Signed)
2/2 NASH;will need periodic paracentesis;-Family mentions h/o Hemachromatosis-Ferritin is normal. His GI is Dr. Watt Climes and we will follow with him as an outpatient. Will resume aldactone at discharge and continue lasix. Patient advised to follow low sodium diet

## 2013-11-12 NOTE — Assessment & Plan Note (Signed)
secondary to primary liver disease. Stable currently. Follow CBC, PT/INR in 1 week

## 2013-11-15 ENCOUNTER — Encounter: Payer: Self-pay | Admitting: Internal Medicine

## 2013-11-15 ENCOUNTER — Non-Acute Institutional Stay (SKILLED_NURSING_FACILITY): Payer: Medicare Other | Admitting: Internal Medicine

## 2013-11-15 ENCOUNTER — Emergency Department (HOSPITAL_COMMUNITY)
Admission: EM | Admit: 2013-11-15 | Discharge: 2013-11-15 | Disposition: A | Discharge status: Home / Self Care | Payer: Medicare Other | Attending: Emergency Medicine | Admitting: Emergency Medicine

## 2013-11-15 ENCOUNTER — Encounter (HOSPITAL_COMMUNITY): Payer: Self-pay | Admitting: Emergency Medicine

## 2013-11-15 ENCOUNTER — Emergency Department (HOSPITAL_COMMUNITY): Payer: Medicare Other

## 2013-11-15 DIAGNOSIS — R0600 Dyspnea, unspecified: Secondary | ICD-10-CM | POA: Diagnosis not present

## 2013-11-15 DIAGNOSIS — R0602 Shortness of breath: Secondary | ICD-10-CM | POA: Diagnosis present

## 2013-11-15 DIAGNOSIS — K219 Gastro-esophageal reflux disease without esophagitis: Secondary | ICD-10-CM | POA: Insufficient documentation

## 2013-11-15 DIAGNOSIS — K7460 Unspecified cirrhosis of liver: Secondary | ICD-10-CM

## 2013-11-15 DIAGNOSIS — Z880 Allergy status to penicillin: Secondary | ICD-10-CM | POA: Diagnosis not present

## 2013-11-15 DIAGNOSIS — Z720 Tobacco use: Secondary | ICD-10-CM | POA: Insufficient documentation

## 2013-11-15 DIAGNOSIS — R188 Other ascites: Secondary | ICD-10-CM | POA: Diagnosis not present

## 2013-11-15 DIAGNOSIS — K7581 Nonalcoholic steatohepatitis (NASH): Secondary | ICD-10-CM

## 2013-11-15 DIAGNOSIS — Z79899 Other long term (current) drug therapy: Secondary | ICD-10-CM | POA: Insufficient documentation

## 2013-11-15 DIAGNOSIS — J9691 Respiratory failure, unspecified with hypoxia: Secondary | ICD-10-CM

## 2013-11-15 LAB — PROTIME-INR
INR: 1.45 (ref 0.00–1.49)
Prothrombin Time: 17.8 seconds — ABNORMAL HIGH (ref 11.6–15.2)

## 2013-11-15 LAB — CBC WITH DIFFERENTIAL/PLATELET
BASOS ABS: 0.1 10*3/uL (ref 0.0–0.1)
BASOS PCT: 1 % (ref 0–1)
Eosinophils Absolute: 0.5 10*3/uL (ref 0.0–0.7)
Eosinophils Relative: 8 % — ABNORMAL HIGH (ref 0–5)
HCT: 25.6 % — ABNORMAL LOW (ref 39.0–52.0)
HEMOGLOBIN: 8.6 g/dL — AB (ref 13.0–17.0)
LYMPHS PCT: 15 % (ref 12–46)
Lymphs Abs: 1 10*3/uL (ref 0.7–4.0)
MCH: 31.5 pg (ref 26.0–34.0)
MCHC: 33.6 g/dL (ref 30.0–36.0)
MCV: 93.8 fL (ref 78.0–100.0)
Monocytes Absolute: 0.9 10*3/uL (ref 0.1–1.0)
Monocytes Relative: 13 % — ABNORMAL HIGH (ref 3–12)
Neutro Abs: 4.2 10*3/uL (ref 1.7–7.7)
Neutrophils Relative %: 63 % (ref 43–77)
Platelets: 82 10*3/uL — ABNORMAL LOW (ref 150–400)
RBC: 2.73 MIL/uL — AB (ref 4.22–5.81)
RDW: 19.1 % — AB (ref 11.5–15.5)
WBC: 6.7 10*3/uL (ref 4.0–10.5)

## 2013-11-15 LAB — COMPREHENSIVE METABOLIC PANEL
ALT: 46 U/L (ref 0–53)
AST: 57 U/L — AB (ref 0–37)
Albumin: 1.9 g/dL — ABNORMAL LOW (ref 3.5–5.2)
Alkaline Phosphatase: 172 U/L — ABNORMAL HIGH (ref 39–117)
Anion gap: 10 (ref 5–15)
BILIRUBIN TOTAL: 2.1 mg/dL — AB (ref 0.3–1.2)
BUN: 17 mg/dL (ref 6–23)
CO2: 24 mEq/L (ref 19–32)
CREATININE: 1.06 mg/dL (ref 0.50–1.35)
Calcium: 7.6 mg/dL — ABNORMAL LOW (ref 8.4–10.5)
Chloride: 100 mEq/L (ref 96–112)
GFR calc Af Amer: 81 mL/min — ABNORMAL LOW (ref >90)
GFR calc non Af Amer: 70 mL/min — ABNORMAL LOW (ref >90)
Glucose, Bld: 117 mg/dL — ABNORMAL HIGH (ref 70–99)
Potassium: 4.3 mEq/L (ref 3.7–5.3)
Sodium: 134 mEq/L — ABNORMAL LOW (ref 137–147)
Total Protein: 5.4 g/dL — ABNORMAL LOW (ref 6.0–8.3)

## 2013-11-15 LAB — URINALYSIS, ROUTINE W REFLEX MICROSCOPIC
BILIRUBIN URINE: NEGATIVE
GLUCOSE, UA: NEGATIVE mg/dL
Hgb urine dipstick: NEGATIVE
KETONES UR: NEGATIVE mg/dL
Leukocytes, UA: NEGATIVE
Nitrite: NEGATIVE
Protein, ur: NEGATIVE mg/dL
Specific Gravity, Urine: 1.02 (ref 1.005–1.030)
Urobilinogen, UA: 0.2 mg/dL (ref 0.0–1.0)
pH: 6 (ref 5.0–8.0)

## 2013-11-15 LAB — BODY FLUID CELL COUNT WITH DIFFERENTIAL
Lymphs, Fluid: 30 %
MONOCYTE-MACROPHAGE-SEROUS FLUID: 62 % (ref 50–90)
NEUTROPHIL FLUID: 8 % (ref 0–25)
WBC FLUID: 67 uL (ref 0–1000)

## 2013-11-15 LAB — LIPASE, BLOOD: LIPASE: 92 U/L — AB (ref 11–59)

## 2013-11-15 LAB — CBG MONITORING, ED: Glucose-Capillary: 94 mg/dL (ref 70–99)

## 2013-11-15 LAB — PRO B NATRIURETIC PEPTIDE: PRO B NATRI PEPTIDE: 864.3 pg/mL — AB (ref 0–125)

## 2013-11-15 LAB — I-STAT CG4 LACTIC ACID, ED: LACTIC ACID, VENOUS: 1.45 mmol/L (ref 0.5–2.2)

## 2013-11-15 NOTE — ED Notes (Signed)
Bed: BD53 Expected date:  Expected time:  Means of arrival:  Comments: SOB abd pain

## 2013-11-15 NOTE — Procedures (Signed)
US guided diagnostic/therapeutic paracentesis performed yielding 4 liters slightly turbid, light yellow fluid. A portion of the fluid was sent to the lab for preordered studies. No immediate complications.

## 2013-11-15 NOTE — ED Notes (Addendum)
Per EMS pt from [facility] with abdominal pressure and fluid overload resulting in shortness of breath over the past four hours. Pt denies chest pain.

## 2013-11-15 NOTE — Progress Notes (Signed)
Patient ID: Clifford Cook, male   DOB: 03/20/1945, 68 y.o.   MRN: [id number]  this is an acute visit.  Level of care skilled.  [facility].  Chief complaint acute visit secondary to increased ascites with some shortness of breath  HPI: Patient is 68 y.o. 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  He is on spironolactone 50 mg a day and Lasix 40 mg a day-vital signs appear to be stable he has somewhat chronically low blood pressure most recently 100/62 Patient is concerned and would like to go to the hospital to get this evaluated which I am in agreement with  Patient did go to the ER late last week secondary to bleeding around his PICC line-however ER evaluation did not show any abnormalities or further bleeding-- apparently this has not been an issue since his return    .  Past Medical History  Diagnosis Date  . Stomach ulcer   . GI bleed   . Liver disorder   . Cirrhosis   . GERD (gastroesophageal reflux disease)     Past Surgical History  Procedure Laterality Date  . Cataract extraction, bilateral    . Surgery for lazy eye      age 1  . Esophagogastroduodenoscopy N/A 04/16/2012    Procedure: ESOPHAGOGASTRODUODENOSCOPY (EGD); Surgeon: Jeryl Columbia, MD; Location: [facility]; Service: Endoscopy; Laterality: N/A;  . Esophagogastroduodenoscopy N/A 04/17/2012    Procedure: ESOPHAGOGASTRODUODENOSCOPY (EGD); Surgeon: Jeryl Columbia, MD; Location: [facility]; Service: Endoscopy; Laterality: N/A;  . Direct laryngoscopy N/A 07/13/2013     Procedure: DIRECT LARYNGOSCOPY WITH EXCISION TUMOR; Surgeon: Rozetta Nunnery, MD; Location: [facility]; Service: ENT; Laterality: N/A;      Medication List       This list is accurate as of: 11/10/13 11:59 PM. Always use your most recent med list.              alendronate 70 MG tablet  Commonly known as: FOSAMAX  Take 1 tablet (70 mg total) by mouth once a week. Hold until antibiotic therapy is completed     diphenhydrAMINE 25 mg capsule  Commonly known as: BENADRYL  Take 1 capsule (25 mg total) by mouth every 12 (twelve) hours as needed for itching.     ertapenem 1 g in sodium chloride 0.9 % 50 mL  Inject 1 g into the vein daily. To be given for 20 more days (last antibiotic day is 11/6)     feeding supplement (RESOURCE BREEZE) Liqd  Take 1 Container by mouth 3 (three) times daily between meals.     ferrous sulfate 325 (65 FE) MG tablet  Take 1 tablet (325 mg total) by mouth 2 (two) times daily with a meal.     furosemide 40 MG tablet  Commonly known as: LASIX  Take 1 tablet (40 mg total) by mouth daily.     HYDROmorphone 2 MG tablet  Commonly known as: DILAUDID  Take 1/2 tablet by mouth every 12 hours as needed for severe pain     Magnesium 250 MG Tabs  Take 250 mg by mouth daily.     pantoprazole  40 MG tablet  Commonly known as: PROTONIX  Take 1 tablet (40 mg total) by mouth daily.     potassium chloride 20 MEQ packet  Commonly known as: KLOR-CON  Take 40 mEq by mouth daily.     sorbitol 70 % Soln  Take 20 mLs by mouth daily as needed for moderate constipation (goal is 1-2 good BM's daily).     spironolactone 50 MG tablet  Commonly known as: ALDACTONE  Take 1 tablet (50 mg total) by mouth daily.     temazepam 7.5 MG capsule  Commonly known as: RESTORIL  Take 1 capsule (7.5 mg total) by mouth at bedtime as needed for sleep.     traMADol 50 MG  tablet  Commonly known as: ULTRAM  Take 1 tablet (50 mg total) by mouth every 8 (eight) hours as needed for moderate pain or severe pain (pain).     Vitamin D (Ergocalciferol) 50000 UNITS Caps capsule  Commonly known as: DRISDOL  Take 50,000 Units by mouth every Wednesday.        No orders of the defined types were placed in this encounter.    There is no immunization history on file for this patient.  History  Substance Use Topics  . Smoking status: Current Every Day Smoker -- 1.00 packs/day    Types: Cigarettes  . Smokeless tobacco: Never Used  . Alcohol Use: No     Comment: over i yr    Family history is noncontributory   Review of Systems  DATA OBTAINED: from patient GENERAL: no fevers,has   fatigue, SKIN: No itching, rash or wounds EYES: No eye pain, redness, discharge EARS: No earache, tinnitus, change in hearing NOSE: No congestion, drainage or bleeding  MOUTH/THROAT: No mouth or tooth pain, No sore throat RESPIRATORY: No cough, wheezing, has inc SOB CARDIAC: No chest pain, palpitations, +lower extremity edema  GI: No abdominal pain, No N/V/D or constipation, No heartburn or reflux  GU: No dysuria, frequency or urgency, or incontinence  MUSCULOSKELETAL: No unrelieved bone/joint pain NEUROLOGIC: No headache, dizziness PSYCHIATRIC: No overt anxiety or sadness, No behavior issue.   There were no vitals filed for this visit.  Physical Exam Temperature 97.9 pulse 62 respirations 20 blood pressure 100/60 O2 saturating 90% on room air GENERAL APPEARANCE: Alert, conversant, No acute distress.  SKIN: No diaphoresis rash--PICC line looks within normal limits with no drainage or bleeding or surrounding erythema left upper arm HEAD: Normocephalic, atraumatic  EYES: Conjunctiva/lids clear. Pupils round, reactive. EOMs intact.  EARS: External exam WNL, canals clear. Hearing grossly normal.  NOSE: No deformity or discharge.   MOUTH/THROAT: Lips w/o lesions  RESPIRATORY: Breathing is even, unlaboredhe does have decreased breath sounds at the bases most noticeable  CARDIOVASCULAR: Heart RRR no murmurs, rubs or gallops. 2+ peripheral edema.  GASTROINTESTINAL: Abdomen is soft, non-tender, is distended--this appears increased since previous exam last week-bowel sounds are tympanic MUSCULOSKELETAL: No abnormal joints or musculature NEUROLOGIC: Cranial nerves 2-12 grossly intact. Moves all extremities  PSYCHIATRIC: Mood and affect appropriate to situation, no behavioral issues  Patient Active Problem List   Diagnosis Date Noted  . Coagulopathy 11/12/2013  . General weakness 11/12/2013  . Bleeding from PICC line 11/11/2013  . Hypokalemia 11/11/2013  . Sepsis 10/31/2013  . Liver cirrhosis secondary to NASH 10/31/2013  . Pneumonia due to streptococcus, group A 10/31/2013  . Ascites 10/31/2013  . Elevated troponin 10/31/2013  . Generalized weakness 10/31/2013  . Hyponatremia 10/31/2013  . Anemia 10/31/2013  . Thrombocytopenia  10/31/2013  . Respiratory failure with hypoxia 10/31/2013  . Rib fracture 10/31/2013  . H/O: GI bleed 04/16/2012  . Tobacco abuse 04/16/2012    CBC  11/12/2013.  WBC 9.2 hemoglobin 9.8 platelet count 91,000.  Sodium 133 potassium 3.5 creatinine 0.8 BUN 16.  11/11/2013-liver function tests notable for bilirubin of 2.1 alkaline phosphatase 151 AST 51 albumin 1.9    Labs (Brief)       Component Value Date/Time   WBC 8.8 11/11/2013 2257   RBC 2.79* 11/11/2013 2257   HGB 8.9* 11/11/2013 2257   HCT 26.1* 11/11/2013 2257   PLT 93* 11/11/2013 2257   MCV 93.5 11/11/2013 2257   LYMPHSABS 0.8 11/11/2013 2257   MONOABS 1.1* 11/11/2013 2257   EOSABS 0.4 11/11/2013 2257   BASOSABS 0.0 11/11/2013 2257      CMP  Labs (Brief)       Component Value Date/Time   NA  135* 11/11/2013 2257   K 3.5* 11/11/2013 2257   CL 101 11/11/2013 2257   CO2 24 11/11/2013 2257   GLUCOSE 130* 11/11/2013 2257   BUN 18 11/11/2013 2257   CREATININE 0.92 11/11/2013 2257   CALCIUM 7.2* 11/11/2013 2257   PROT 5.1* 11/11/2013 2257   ALBUMIN 1.9* 11/11/2013 2257   AST 51* 11/11/2013 2257   ALT 40 11/11/2013 2257   ALKPHOS 151* 11/11/2013 2257   BILITOT 2.1* 11/11/2013 2257   GFRNONAA 84* 11/11/2013 2257   GFRAA >90 11/11/2013 2257      Assessment and Plan  Sepsis most likely due to pneumonia and strep viridans bacteremia (see below). Continue antibiotics. Now on invanz. Afebrile and WBC's WNL.  Positive strep viridans bacteremia: 2/2. Not a good candidate for TEE (too risky); after discussing with ID and given patient allergies, plan is to switch to invanz today . Plan is for 4 weeks total. Repeated Blood cx's preliminary w/o growth. PICC placed on 10/25. On day # 8/28 of antibiotics. Last antibiotic day is 11/29/13   Pneumonia due to streptococcus, group A Considered 2/2 strep viridans;CXr 2 views confirms infiltrates. Continue abx's; on Invanz now. Continue flutter valve. No fever and normal WBC's   Ascites  Patient status post paracentesis before hospital discharge-however appears to be significantly accumulating fluid again-patient is having some shortness of breath although no signs of acute distress here-however concern here that this will have to be addressed expediently-discussed with patient-will send him for to the ER for evaluation--he continues on spironolactone and Lasix as noted above   Liver cirrhosis secondary to NASH 2/2 NASH;will need periodic paracentesis;-Family mentions h/o Hemachromatosis-Ferritin is normal. His GI is Dr. Watt Climes and we will follow with him as an outpatient. 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 Stable  currently.   H/O: GI bleed - heparin products avoided during admission. Continue Protonix 40 mg daily   Rib fracture no significant pain. Will recommend minimize use of narcotics; but will definitely NSAID's and tylenol (given risk for bleeding and cirrhosis   General weakness  Patient is undergoing rehabilitation here at skilled nursing apparently is doing a bit better   Elevated troponin troponin due to [place] ischemia-troponins slightly elevated on admission. But then stabilize and further sets were neg (a total of 3). EKG w/o ischemic changes and no CP. Echo shows some WM anomally but poor acoustic windows, same as per contrast Echo; per cardiology no need of further workup.   [id number]

## 2013-11-15 NOTE — Discharge Instructions (Signed)
As discussed, it is important that you follow up as soon as possible with your physician for continued management of your condition. ° °If you develop any new, or concerning changes in your condition, please return to the emergency department immediately. ° °

## 2013-11-15 NOTE — ED Notes (Signed)
Pt off the floor for US

## 2013-11-15 NOTE — ED Provider Notes (Signed)
CSN: [id number]     Arrival date & time 11/15/13  1202 History   First MD Initiated Contact with Patient 11/15/13 1210     Chief Complaint  Patient presents with  . Bloated  . Shortness of Breath     HPI  Patient presents with concerns of dyspnea, fatigue, weight gain. Patient has had symptoms for at least one week, but worse the past day. He has no new focal pain, but does describe generalized discomfort. He denies confusion, disorientation, fever, chills. Patient's history is most notable for recent episode of pneumonia requiring hospitalization. During that episode, patient had cessation of his diuretic.  Patient has subsequently begun his diuretic again. Regardless, patient feels symptoms are progressive.   Past Medical History  Diagnosis Date  . Stomach ulcer   . GI bleed   . Liver disorder   . Cirrhosis   . GERD (gastroesophageal reflux disease)    Past Surgical History  Procedure Laterality Date  . Cataract extraction, bilateral    . Surgery for lazy eye      age 68  . Esophagogastroduodenoscopy N/A 04/16/2012    Procedure: ESOPHAGOGASTRODUODENOSCOPY (EGD);  Surgeon: Jeryl Columbia, MD;  Location: [facility];  Service: Endoscopy;  Laterality: N/A;  . Esophagogastroduodenoscopy N/A 04/17/2012    Procedure: ESOPHAGOGASTRODUODENOSCOPY (EGD);  Surgeon: Jeryl Columbia, MD;  Location: [facility];  Service: Endoscopy;  Laterality: N/A;  . Direct laryngoscopy N/A 07/13/2013    Procedure: DIRECT LARYNGOSCOPY WITH EXCISION TUMOR;  Surgeon: Rozetta Nunnery, MD;  Location: [facility];  Service: ENT;  Laterality: N/A;   No family history on file. History  Substance Use Topics  . Smoking status: Current Every Day Smoker -- 1.00 packs/day    Types: Cigarettes  . Smokeless tobacco: Never Used  . Alcohol Use: No     Comment: over i yr    Review of Systems  Constitutional:       Per HPI, otherwise negative  HENT:       Per HPI, otherwise negative    Respiratory:       Per HPI, otherwise negative  Cardiovascular:       Per HPI, otherwise negative  Gastrointestinal: Negative for vomiting.  Endocrine:       Negative aside from HPI  Genitourinary:       Neg aside from HPI   Musculoskeletal:       Per HPI, otherwise negative  Skin: Negative.   Neurological: Negative for syncope.      Allergies  Pulmicort and Penicillins  Home Medications   Prior to Admission medications   Medication Sig Start Date End Date Taking? Authorizing Provider  alendronate (FOSAMAX) 70 MG tablet Take 1 tablet (70 mg total) by mouth once a week. Hold until antibiotic therapy is completed 11/09/13   Barton Dubois, MD  diphenhydrAMINE (BENADRYL) 25 mg capsule Take 1 capsule (25 mg total) by mouth every 12 (twelve) hours as needed for itching. 11/09/13   Barton Dubois, MD  ertapenem 1 g in sodium chloride 0.9 % 50 mL Inject 1 g into the vein daily. To be given for 20 more days (last antibiotic day is 11/6) 11/09/13 11/29/13  Barton Dubois, MD  feeding supplement, RESOURCE BREEZE, (RESOURCE BREEZE) LIQD Take 1 Container by mouth 3 (three) times daily between meals. 11/09/13   Barton Dubois, MD  ferrous sulfate 325 (65 FE) MG tablet Take 1 tablet (325 mg total) by mouth 2 (two) times daily with a meal. 11/09/13  Barton Dubois, MD  furosemide (LASIX) 40 MG tablet Take 1 tablet (40 mg total) by mouth daily. 11/09/13   Barton Dubois, MD  HYDROmorphone (DILAUDID) 2 MG tablet Take 1-2 mg by mouth every 12 (twelve) hours as needed for severe pain.    Historical Provider, MD  Magnesium 250 MG TABS Take 250 mg by mouth daily.    Historical Provider, MD  pantoprazole (PROTONIX) 40 MG tablet Take 1 tablet (40 mg total) by mouth daily. 11/09/13   Barton Dubois, MD  potassium chloride 20 MEQ/15ML (10%) solution Take 40 mEq by mouth daily.    Historical Provider, MD  sorbitol 70 % SOLN Take 20 mLs by mouth daily as needed for moderate constipation (goal is 1-2 good BM's  daily). 11/09/13   Barton Dubois, MD  spironolactone (ALDACTONE) 50 MG tablet Take 1 tablet (50 mg total) by mouth daily. 11/09/13   Barton Dubois, MD  temazepam (RESTORIL) 7.5 MG capsule Take 1 capsule (7.5 mg total) by mouth at bedtime as needed for sleep. 11/09/13   Blanchie Serve, MD  traMADol (ULTRAM) 50 MG tablet Take 1 tablet (50 mg total) by mouth every 8 (eight) hours as needed for moderate pain or severe pain (pain). 11/09/13   Blanchie Serve, MD  Vitamin D, Ergocalciferol, (DRISDOL) 50000 UNITS CAPS capsule Take 50,000 Units by mouth every Wednesday.     Historical Provider, MD   BP 95/59 mmHg  Pulse 103  Temp(Src) 97.4 F (36.3 C) (Oral)  Resp 14  SpO2 94% Physical Exam  Constitutional: He is oriented to person, place, and time. He appears well-developed. No distress.  HENT:  Head: Normocephalic and atraumatic.  Eyes: Conjunctivae and EOM are normal.  Cardiovascular: Normal rate and regular rhythm.   Pulmonary/Chest: No stridor. Tachypnea noted. He has decreased breath sounds.  Abdominal: He exhibits distension. There is tenderness. There is guarding.  Protuberant abdomen   Musculoskeletal: He exhibits edema.  Impressive symmetric bilateral LE edema ascending to the thighs.  Neurological: He is alert and oriented to person, place, and time.  Skin: Skin is warm and dry.     Psychiatric: He has a normal mood and affect.  Nursing note and vitals reviewed.   ED Course  Procedures (including critical care time) Labs Review Labs Reviewed  CBC WITH DIFFERENTIAL - Abnormal; Notable for the following:    RBC 2.73 (*)    Hemoglobin 8.6 (*)    HCT 25.6 (*)    RDW 19.1 (*)    Platelets 82 (*)    Monocytes Relative 13 (*)    Eosinophils Relative 8 (*)    All other components within normal limits  COMPREHENSIVE METABOLIC PANEL - Abnormal; Notable for the following:    Sodium 134 (*)    Glucose, Bld 117 (*)    Calcium 7.6 (*)    Total Protein 5.4 (*)    Albumin 1.9 (*)     AST 57 (*)    Alkaline Phosphatase 172 (*)    Total Bilirubin 2.1 (*)    GFR calc non Af Amer 70 (*)    GFR calc Af Amer 81 (*)    All other components within normal limits  LIPASE, BLOOD - Abnormal; Notable for the following:    Lipase 92 (*)    All other components within normal limits  URINALYSIS, ROUTINE W REFLEX MICROSCOPIC - Abnormal; Notable for the following:    Color, Urine AMBER (*)    APPearance CLOUDY (*)    All other components  within normal limits  PROTIME-INR - Abnormal; Notable for the following:    Prothrombin Time 17.8 (*)    All other components within normal limits  PRO B NATRIURETIC PEPTIDE - Abnormal; Notable for the following:    Pro B Natriuretic peptide (BNP) 864.3 (*)    All other components within normal limits  BODY FLUID CULTURE  GRAM STAIN  SYNOVIAL CELL COUNT + DIFF, W/ CRYSTALS  I-STAT CG4 LACTIC ACID, ED  CBG MONITORING, ED    Imaging Review Dg Chest 2 View  11/15/2013   CLINICAL DATA:  Dyspnea ; bilateral lower extremity edema  EXAM: CHEST  2 VIEW  COMPARISON:  November 04, 2013  FINDINGS: There has been marked interval resolution of pulmonary edema. Currently there is evidence of underlying emphysema with scattered areas of scarring bilaterally. There is currently trace interstitial edema and no consolidation. Heart is upper normal in size. The pulmonary vascularity reflects underlying emphysema. There is a minimal right effusion. No adenopathy. Central catheter tip in superior vena cava. No pneumothorax.  IMPRESSION: Significant interval resolution of pulmonary edema compared to prior study. Currently there is trace edema with heart upper normal in size and minimal right effusion. There is felt to be mild congestive heart failure superimposed on emphysema currently. No consolidation. Central catheter tip in superior vena cava. No pneumothorax.   Electronically Signed   By: Lowella Grip M.D.   On: 11/15/2013 13:29     EKG  Interpretation   Date/Time:  Monday November 15 2013 12:06:24 EST Ventricular Rate:  106 PR Interval:    QRS Duration: 120 QT Interval:  393 QTC Calculation: 522 R Axis:   -52 Text Interpretation:  Atrial flutter Paired ventricular premature  complexes Nonspecific IVCD with LAD Inferior infarct, old aflutter versus  intermittent ST w PAC (not substantially changed from prior) Artifact  Abnormal ekg Confirmed by Carmin Muskrat  MD [phone]) on 11/15/2013  12:53:23 PM     I reviewed the EMR  Patient continues to complain of dyspnea. He also complains of his abdominal distention. Patient has not had paracentesis in some time. He will have a therapeutic tap.   Update: On return from paracentesis patient was much improved MDM   Final diagnoses:  Dyspnea  Ascites    Patient with multiple medical probes, including cirrhosis, recent pneumonia now presents with dyspnea, abdominal discomfort. Patient has substantial abdominal girth, and labs are consistent with hepatic dysfunction. Given the patient's history, physical exam findings, patient had therapeutic paracentesis in addition to labs. Patient remained awake and alert throughout, had no fever, and there is low suspicion for peritonitis.  Patient was returned to his nursing facility in stable condition.    Carmin Muskrat, MD 11/16/13 [phone]

## 2013-11-15 NOTE — ED Notes (Signed)
CBG 94 

## 2013-11-15 NOTE — ED Notes (Signed)
Patient in radiology

## 2013-11-16 ENCOUNTER — Encounter (HOSPITAL_COMMUNITY): Payer: Self-pay | Admitting: Emergency Medicine

## 2013-11-16 ENCOUNTER — Emergency Department (HOSPITAL_COMMUNITY)
Admission: EM | Admit: 2013-11-16 | Discharge: 2013-11-16 | Disposition: A | Discharge status: Home / Self Care | Payer: Medicare Other | Attending: Emergency Medicine | Admitting: Emergency Medicine

## 2013-11-16 DIAGNOSIS — K219 Gastro-esophageal reflux disease without esophagitis: Secondary | ICD-10-CM | POA: Insufficient documentation

## 2013-11-16 DIAGNOSIS — Z79899 Other long term (current) drug therapy: Secondary | ICD-10-CM | POA: Insufficient documentation

## 2013-11-16 DIAGNOSIS — Z9889 Other specified postprocedural states: Secondary | ICD-10-CM

## 2013-11-16 DIAGNOSIS — Z720 Tobacco use: Secondary | ICD-10-CM | POA: Diagnosis not present

## 2013-11-16 DIAGNOSIS — Y838 Other surgical procedures as the cause of abnormal reaction of the patient, or of later complication, without mention of misadventure at the time of the procedure: Secondary | ICD-10-CM | POA: Insufficient documentation

## 2013-11-16 DIAGNOSIS — T8189XA Other complications of procedures, not elsewhere classified, initial encounter: Secondary | ICD-10-CM | POA: Insufficient documentation

## 2013-11-16 DIAGNOSIS — Z880 Allergy status to penicillin: Secondary | ICD-10-CM | POA: Diagnosis not present

## 2013-11-16 NOTE — ED Notes (Signed)
Bed: [facility]40 Expected date:  Expected time:  Means of arrival:  Comments: Ems- abdominal wound leaking

## 2013-11-16 NOTE — Discharge Instructions (Signed)
Follow up with dr Watt Climes

## 2013-11-16 NOTE — ED Notes (Addendum)
Per EMS pt from [facility] was seen yesterday in ED for drainage from previous paracentesis, was discharged home. Per EMS around 0200 today pt noticed paracentesis site was draining again, rehab facility has been managing drainage by changing bandages but wanted patient evaluated. Facility conferred with pt's PCP who recommended ED visit. Pt currently on last week of Abx for pneumonia.  On assessment, paracentesis site is draining minimal amount of serous fluid. No bleeding noted.

## 2013-11-16 NOTE — ED Provider Notes (Signed)
CSN: [id number]     Arrival date & time 11/16/13  109 History   First MD Initiated Contact with Patient 11/16/13 1537     Chief Complaint  Patient presents with  . Drainage post-paracentesis      (Consider location/radiation/quality/duration/timing/severity/associated sxs/prior Treatment) HPI Comments: Pt here due to leaking paracentesis site--had procedure yesterday and now has clear fluid drainage--no fever, abd pain, n/v--no surrounding erythema--has been using direct pressure and bandages--also, c/o worsening whole body edema--recently had dose of lasix and spironolactone increased--no cough, dyspnea, chest pain--sx persistent and nothing makes them worse  The history is provided by the patient.    Past Medical History  Diagnosis Date  . Stomach ulcer   . GI bleed   . Liver disorder   . Cirrhosis   . GERD (gastroesophageal reflux disease)    Past Surgical History  Procedure Laterality Date  . Cataract extraction, bilateral    . Surgery for lazy eye      age 68  . Esophagogastroduodenoscopy N/A 04/16/2012    Procedure: ESOPHAGOGASTRODUODENOSCOPY (EGD);  Surgeon: Jeryl Columbia, MD;  Location: [facility];  Service: Endoscopy;  Laterality: N/A;  . Esophagogastroduodenoscopy N/A 04/17/2012    Procedure: ESOPHAGOGASTRODUODENOSCOPY (EGD);  Surgeon: Jeryl Columbia, MD;  Location: [facility];  Service: Endoscopy;  Laterality: N/A;  . Direct laryngoscopy N/A 07/13/2013    Procedure: DIRECT LARYNGOSCOPY WITH EXCISION TUMOR;  Surgeon: Rozetta Nunnery, MD;  Location: [facility];  Service: ENT;  Laterality: N/A;   History reviewed. No pertinent family history. History  Substance Use Topics  . Smoking status: Current Every Day Smoker -- 1.00 packs/day    Types: Cigarettes  . Smokeless tobacco: Never Used  . Alcohol Use: No     Comment: over i yr    Review of Systems  All other systems reviewed and are negative.     Allergies  Pulmicort and Penicillins  Home  Medications   Prior to Admission medications   Medication Sig Start Date End Date Taking? Authorizing Provider  alendronate (FOSAMAX) 70 MG tablet Take 1 tablet (70 mg total) by mouth once a week. Hold until antibiotic therapy is completed 11/09/13   Barton Dubois, MD  diphenhydrAMINE (BENADRYL) 25 mg capsule Take 1 capsule (25 mg total) by mouth every 12 (twelve) hours as needed for itching. 11/09/13   Barton Dubois, MD  ertapenem 1 g in sodium chloride 0.9 % 50 mL Inject 1 g into the vein daily. To be given for 20 more days (last antibiotic day is 11/6) 11/09/13 11/29/13  Barton Dubois, MD  feeding supplement, RESOURCE BREEZE, (RESOURCE BREEZE) LIQD Take 1 Container by mouth 3 (three) times daily between meals. 11/09/13   Barton Dubois, MD  ferrous sulfate 325 (65 FE) MG tablet Take 1 tablet (325 mg total) by mouth 2 (two) times daily with a meal. 11/09/13   Barton Dubois, MD  furosemide (LASIX) 40 MG tablet Take 1 tablet (40 mg total) by mouth daily. 11/09/13   Barton Dubois, MD  HYDROmorphone (DILAUDID) 2 MG tablet Take 1-2 mg by mouth every 12 (twelve) hours as needed for severe pain.    Historical Provider, MD  Magnesium 250 MG TABS Take 250 mg by mouth daily.    Historical Provider, MD  pantoprazole (PROTONIX) 40 MG tablet Take 1 tablet (40 mg total) by mouth daily. 11/09/13   Barton Dubois, MD  potassium chloride 20 MEQ/15ML (10%) solution Take 40 mEq by mouth daily.    Historical Provider, MD  sorbitol 70 % SOLN Take 20 mLs by mouth daily as needed for moderate constipation (goal is 1-2 good BM's daily). 11/09/13   Barton Dubois, MD  spironolactone (ALDACTONE) 50 MG tablet Take 1 tablet (50 mg total) by mouth daily. 11/09/13   Barton Dubois, MD  temazepam (RESTORIL) 7.5 MG capsule Take 1 capsule (7.5 mg total) by mouth at bedtime as needed for sleep. 11/09/13   Blanchie Serve, MD  traMADol (ULTRAM) 50 MG tablet Take 1 tablet (50 mg total) by mouth every 8 (eight) hours as needed for moderate  pain or severe pain (pain). 11/09/13   Blanchie Serve, MD  Vitamin D, Ergocalciferol, (DRISDOL) 50000 UNITS CAPS capsule Take 50,000 Units by mouth every Wednesday.     Historical Provider, MD   BP 92/46 mmHg  Pulse 104  Temp(Src) 98.1 F (36.7 C) (Oral)  Resp 16  SpO2 95% Physical Exam  Constitutional: He is oriented to person, place, and time. He appears well-developed and well-nourished.  Non-toxic appearance. No distress.  HENT:  Head: Normocephalic and atraumatic.  Eyes: Conjunctivae, EOM and lids are normal. Pupils are equal, round, and reactive to light.  Neck: Normal range of motion. Neck supple. No tracheal deviation present. No thyroid mass present.  Cardiovascular: Normal rate, regular rhythm and normal heart sounds.  Exam reveals no gallop.   No murmur heard. Pulmonary/Chest: Effort normal and breath sounds normal. No stridor. No respiratory distress. He has no decreased breath sounds. He has no wheezes. He has no rhonchi. He has no rales.  Abdominal: Soft. Normal appearance and bowel sounds are normal. He exhibits ascites. He exhibits no distension. There is no tenderness. There is no rebound and no CVA tenderness.    Musculoskeletal: Normal range of motion. He exhibits no edema or tenderness.  Bilateral le edema  Neurological: He is alert and oriented to person, place, and time. He has normal strength. No cranial nerve deficit or sensory deficit. GCS eye subscore is 4. GCS verbal subscore is 5. GCS motor subscore is 6.  Skin: Skin is warm and dry. No abrasion and no rash noted.  Psychiatric: He has a normal mood and affect. His speech is normal and behavior is normal.  Nursing note and vitals reviewed.   ED Course  Procedures (including critical care time) Labs Review Labs Reviewed - No data to display  Imaging Review Dg Chest 2 View  11/15/2013   CLINICAL DATA:  Dyspnea ; bilateral lower extremity edema  EXAM: CHEST  2 VIEW  COMPARISON:  November 04, 2013  FINDINGS:  There has been marked interval resolution of pulmonary edema. Currently there is evidence of underlying emphysema with scattered areas of scarring bilaterally. There is currently trace interstitial edema and no consolidation. Heart is upper normal in size. The pulmonary vascularity reflects underlying emphysema. There is a minimal right effusion. No adenopathy. Central catheter tip in superior vena cava. No pneumothorax.  IMPRESSION: Significant interval resolution of pulmonary edema compared to prior study. Currently there is trace edema with heart upper normal in size and minimal right effusion. There is felt to be mild congestive heart failure superimposed on emphysema currently. No consolidation. Central catheter tip in superior vena cava. No pneumothorax.   Electronically Signed   By: Lowella Grip M.D.   On: 11/15/2013 13:29   US Paracentesis  11/15/2013   INDICATION: Cirrhosis, recurrent ascites. Request is made for diagnostic and therapeutic paracentesis.  EXAM: ULTRASOUND-GUIDED DIAGNOSTIC AND THERAPEUTIC PARACENTESIS  COMPARISON:  None.  MEDICATIONS: None.  COMPLICATIONS: None immediate  TECHNIQUE: Informed written consent was obtained from the patient after a discussion of the risks, benefits and alternatives to treatment. A timeout was performed prior to the initiation of the procedure.  Initial ultrasound scanning demonstrates a moderate amount of ascites within the right lower abdominal quadrant. The right lower abdomen was prepped and draped in the usual sterile fashion. 1% lidocaine was used for local anesthesia. Under direct ultrasound guidance, a 19 gauge, 7-cm, Yueh catheter was introduced. An ultrasound image was saved for documentation purposed. The paracentesis was performed. The catheter was removed and a dressing was applied. The patient tolerated the procedure well without immediate post procedural complication.  FINDINGS: A total of approximately 4 liters of slightly turbid, light  yellow fluid was removed. Samples were sent to the laboratory as requested by the clinical team.  IMPRESSION: Successful ultrasound-guided diagnostic and therapeutic paracentesis yielding 4 liters of peritoneal fluid.  Read by: Rowe Robert, PA-C   Electronically Signed   By: Maryclare Bean M.D.   On: 11/15/2013 16:40     EKG Interpretation None      MDM   Final diagnoses:  None    Labs from yesterday reviewed and renal fx nl, spoke with dr Watt Climes, pts gi md, he increased the dose of the patients diuretics yesterday and follow along in the office, colostomy bag applied to leaking site, plan d/w family and pt to be sent back to rehab--low bp and HR noted and this appears to be patients baseline    Leota Jacobsen, MD 11/16/13 1612

## 2013-11-19 ENCOUNTER — Ambulatory Visit (HOSPITAL_COMMUNITY): Admission: RE | Admit: 2013-11-19 | Payer: Medicare Other | Source: Ambulatory Visit

## 2013-11-19 LAB — BODY FLUID CULTURE
CULTURE: NO GROWTH
GRAM STAIN: NONE SEEN
Special Requests: NORMAL

## 2013-11-22 ENCOUNTER — Non-Acute Institutional Stay (SKILLED_NURSING_FACILITY): Payer: Medicare Other | Admitting: Internal Medicine

## 2013-11-22 ENCOUNTER — Encounter: Payer: Self-pay | Admitting: Internal Medicine

## 2013-11-22 DIAGNOSIS — K7460 Unspecified cirrhosis of liver: Secondary | ICD-10-CM

## 2013-11-22 DIAGNOSIS — R188 Other ascites: Secondary | ICD-10-CM

## 2013-11-22 DIAGNOSIS — K7581 Nonalcoholic steatohepatitis (NASH): Secondary | ICD-10-CM

## 2013-11-22 DIAGNOSIS — K7469 Other cirrhosis of liver: Secondary | ICD-10-CM

## 2013-11-22 DIAGNOSIS — J154 Pneumonia due to other streptococci: Secondary | ICD-10-CM

## 2013-11-22 DIAGNOSIS — R531 Weakness: Secondary | ICD-10-CM

## 2013-11-22 NOTE — Progress Notes (Signed)
Patient ID: Clifford Cook, male   DOB: 1945-02-10, 68 y.o.   MRN: [id number]   this is a discharge note  Level of care skilled.  [facility].  Chief complaint-discharge nnote  HPI: Patient is 68 y.o. 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  He does have a supportive family he will need extensive therapy as well as home health support for his significant medical issues also will need a top shower bench grab bar bedside commode and rolling walker again secondary to significant debility  Today he has no complaints he has gone to the hospital at times for a paracentesis this is followed closely by Dr. [place] Blas he has an appointment with him tomorrow.         .  Past Medical History  Diagnosis Date  . Stomach ulcer   . GI bleed   . Liver disorder   . Cirrhosis   . GERD (gastroesophageal reflux disease)     Past Surgical History  Procedure Laterality Date  . Cataract extraction, bilateral    . Surgery for lazy eye      age 88  . Esophagogastroduodenoscopy N/A 04/16/2012    Procedure: ESOPHAGOGASTRODUODENOSCOPY (EGD); Surgeon: Jeryl Columbia, MD; Location: [facility]; Service: Endoscopy; Laterality: N/A;  . Esophagogastroduodenoscopy N/A 04/17/2012    Procedure: ESOPHAGOGASTRODUODENOSCOPY (EGD); Surgeon: Jeryl Columbia, MD; Location: [facility]; Service: Endoscopy; Laterality: N/A;  . Direct laryngoscopy N/A 07/13/2013    Procedure: DIRECT LARYNGOSCOPY WITH EXCISION TUMOR; Surgeon: Rozetta Nunnery, MD; Location: [facility]; Service: ENT; Laterality: N/A;      Medication List        .              alendronate 70 MG  tablet  Commonly known as: FOSAMAX  Take 1 tablet (70 mg total) by mouth once a week. Hold until antibiotic therapy is completed     diphenhydrAMINE 25 mg capsule  Commonly known as: BENADRYL  Take 1 capsule (25 mg total) by mouth every 12 (twelve) hours as needed for itching.     ertapenem 1 g in sodium chloride 0.9 % 50 mL  Inject 1 g into the vein daily. To be given for 20 more days (last antibiotic day is 11/6)     feeding supplement (RESOURCE BREEZE) Liqd  Take 1 Container by mouth 3 (three) times daily between meals.     ferrous sulfate 325 (65 FE) MG tablet  Take 1 tablet (325 mg total) by mouth 2 (two) times daily with a meal.     furosemide 40 MG tablet  Commonly known as: LASIX  Take 1 tablet (40 mg total) by mouth daily.     HYDROmorphone 2 MG tablet  Commonly known as: DILAUDID  Take 1/2 tablet by mouth every 12 hours as needed for severe pain     Magnesium 250 MG Tabs  Take 250 mg by mouth daily.     pantoprazole 40 MG tablet  Commonly known as: PROTONIX  Take 1 tablet (40 mg total) by mouth daily.     potassium chloride 20 MEQ packet  Commonly known as: KLOR-CON  Take 40 mEq by mouth daily.     sorbitol 70 % Soln  Take 20 mLs by mouth daily as needed for moderate constipation (goal is 1-2 good  BM's daily).     spironolactone 50 MG tablet  Commonly known as: ALDACTONE  Take 1 tablet (50 mg total) by mouth daily.     temazepam 7.5 MG capsule  Commonly known as: RESTORIL  Take 1 capsule (7.5 mg total) by mouth at bedtime as needed for sleep.     traMADol 50 MG tablet  Commonly known as: ULTRAM  Take 1 tablet (50 mg total) by mouth every 8 (eight) hours as needed for moderate pain or severe pain (pain).     Vitamin D (Ergocalciferol) 50000 UNITS Caps capsule  Commonly known as: DRISDOL  Take 50,000 Units by mouth  every Wednesday.        No orders of the defined types were placed in this encounter.    There is no immunization history on file for this patient.  History  Substance Use Topics  . Smoking status: Current Every Day Smoker -- 1.00 packs/day    Types: Cigarettes  . Smokeless tobacco: Never Used  . Alcohol Use: No     Comment: over i yr    Family history is noncontributory   Review of Systems  DATA OBTAINED: from patient GENERAL: no fevers,has fatiguebut has gained some strength, SKIN: No itching, rash or wounds EYES: No eye pain, redness, discharge EARS: No earache, tinnitus, change in hearing NOSE: No congestion, drainage or bleeding  MOUTH/THROAT: No mouth or tooth pain, No sore throat RESPIRATORY: No cough, wheezing,no increase shortness of breath from baseline CARDIAC: No chest pain, palpitations, +lower extremity edema  GI: No abdominal pain, No N/V/D or constipation, No heartburn or reflux  GU: No dysuria, frequency or urgency, or incontinence  MUSCULOSKELETAL: No unrelieved bone/joint pain NEUROLOGIC: No headache, dizziness PSYCHIATRIC: No overt anxiety or sadness, No behavior issue.   Marland Kitchen  Physical Exam Temperature 96.8 pulse 92 respirations 18 blood pressure 107/60 GENERAL APPEARANCE: Alert, conversant, No acute distress.  SKIN: No diaphoresis rash--PICC line looks within normal limits with no drainage or bleeding or surrounding erythema left upper arm HEAD: Normocephalic, atraumatic  EYES: Conjunctiva/lids clear. Pupils round, reactive. EOMs intact.  EARS: External exam WNL, canals clear. Hearing grossly normal.  NOSE: No deformity or discharge.  MOUTH/THROAT: Lips w/o lesions  RESPIRATORY: Breathing is even, unlaboredhe does have decreased breath sounds at the bases most noticeable  CARDIOVASCULAR: Heart RRR no murmurs, rubs or gallops. 1-2 + peripheral edema.  GASTROINTESTINAL: Abdomen is  soft, non-tender, is distended--appears relatively at baseline he does have a drain right side of abdomen-bowel sounds are positive MUSCULOSKELETAL: No abnormal joints or musculature--is able to ambulate with a rolling walker NEUROLOGIC: Cranial nerves 2-12 grossly intact. Moves all extremities  PSYCHIATRIC: Mood and affect appropriate to situation, no behavioral issues--however is somewhat confused about the time of his appointment thinking it was today actually is tomorrow  Patient Active Problem List   Diagnosis Date Noted  . Coagulopathy 11/12/2013  . General weakness 11/12/2013  . Bleeding from PICC line 11/11/2013  . Hypokalemia 11/11/2013  . Sepsis 10/31/2013  . Liver cirrhosis secondary to NASH 10/31/2013  . Pneumonia due to streptococcus, group A 10/31/2013  . Ascites 10/31/2013  . Elevated troponin 10/31/2013  . Generalized weakness 10/31/2013  . Hyponatremia 10/31/2013  . Anemia 10/31/2013  . Thrombocytopenia 10/31/2013  . Respiratory failure with hypoxia 10/31/2013  . Rib fracture 10/31/2013  . H/O: GI bleed 04/16/2012  . Tobacco abuse 04/16/2012  Labs.  Number second 2015.  WBC 9.2 hemoglobin 9.8 platelets 91,000.  Sodium 133 potassium 3.5  BUN 16 creatinine 0.8.  Liver function tests show an albumin of 1.9 AST of 57 ALT of 46other phosphatase 172-bilirubin of 2.1  CBC  11/12/2013.  WBC 9.2 hemoglobin 9.8 platelet count 91,000.  Sodium 133 potassium 3.5 creatinine 0.8 BUN 16.  11/11/2013-liver function tests notable for bilirubin of 2.1 alkaline phosphatase 151 AST 51 albumin 1.9    Labs (Brief)      Component Value Date/Time   WBC 8.8 11/11/2013 2257   RBC 2.79* 11/11/2013 2257   HGB 8.9* 11/11/2013 2257   HCT 26.1* 11/11/2013 2257   PLT 93* 11/11/2013 2257   MCV 93.5 11/11/2013  2257   LYMPHSABS 0.8 11/11/2013 2257   MONOABS 1.1* 11/11/2013 2257   EOSABS 0.4 11/11/2013 2257   BASOSABS 0.0 11/11/2013 2257      CMP  Labs (Brief)      Component Value Date/Time   NA 135* 11/11/2013 2257   K 3.5* 11/11/2013 2257   CL 101 11/11/2013 2257   CO2 24 11/11/2013 2257   GLUCOSE 130* 11/11/2013 2257   BUN 18 11/11/2013 2257   CREATININE 0.92 11/11/2013 2257   CALCIUM 7.2* 11/11/2013 2257   PROT 5.1* 11/11/2013 2257   ALBUMIN 1.9* 11/11/2013 2257   AST 51* 11/11/2013 2257   ALT 40 11/11/2013 2257   ALKPHOS 151* 11/11/2013 2257   BILITOT 2.1* 11/11/2013 2257   GFRNONAA 84* 11/11/2013 2257   GFRAA >90 11/11/2013 2257      Assessment and Plan  Sepsis most likely due to pneumonia and strep viridans bacteremia (see below). Continue antibiotics. Now on invanz. Afebrile and WBC's WNL--he will continue Invanz until November 18-apparently there were a couple missed doses  and thus will give him antibiotics for 2 additional days.      Pneumonia due to streptococcus, group A Considered 2/2 strep viridans;CXr 2 views confirms infiltrates. Continue abx's; on Invanz now. Continue flutter valve. No fever and normal WBC's   Ascites   This is followed closely by GI-he has required paracentesis on a regular basis-she continues on Lasix as well as  spironolactone   Liver cirrhosis secondary to NASH 2/2 NASH;will need periodic paracentesis;-Family mentions h/o Hemachromatosis-Ferritin is normal. His GI is Dr. Watt Climes and we will follow with him as an outpatient. 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 Stable currently.   H/O: GI bleed - heparin products avoided during admission. Continue Protonix 40 mg daily       General  weakness  Continues to be significant he will need extensive support at home including home health nursing support PT and OT also will need a tub shower bench grab bar bedside commode and rolling walker   [id number]-of note greater than 35 minutes spent on this discharge summary-scripts of been written   Elevated troponin troponin due to [place] ischemia-troponins slightly elevated on admission. But then stabilize and further sets were neg (a total of 3). EKG w/o ischemic changes and no CP. Echo shows some WM anomally but poor acoustic windows, same as per contrast Echo; per cardiology no need of further workup

## 2013-11-24 ENCOUNTER — Other Ambulatory Visit: Payer: Self-pay | Admitting: Internal Medicine

## 2013-11-24 DIAGNOSIS — R188 Other ascites: Secondary | ICD-10-CM

## 2013-11-24 DIAGNOSIS — K7469 Other cirrhosis of liver: Secondary | ICD-10-CM

## 2013-11-25 ENCOUNTER — Inpatient Hospital Stay (HOSPITAL_COMMUNITY)
Admission: EM | Admit: 2013-11-25 | Discharge: 2013-11-25 | DRG: 432 | Disposition: A | Discharge status: Home / Self Care | Payer: Medicare Other | Attending: Emergency Medicine | Admitting: Emergency Medicine

## 2013-11-25 ENCOUNTER — Ambulatory Visit (HOSPITAL_COMMUNITY)
Admission: RE | Admit: 2013-11-25 | Discharge: 2013-11-25 | Disposition: A | Discharge status: Home / Self Care | Payer: Medicare Other | Source: Ambulatory Visit | Attending: Internal Medicine | Admitting: Internal Medicine

## 2013-11-25 ENCOUNTER — Emergency Department (HOSPITAL_COMMUNITY): Payer: Medicare Other

## 2013-11-25 ENCOUNTER — Encounter (HOSPITAL_COMMUNITY): Payer: Self-pay

## 2013-11-25 DIAGNOSIS — R188 Other ascites: Secondary | ICD-10-CM

## 2013-11-25 DIAGNOSIS — R531 Weakness: Secondary | ICD-10-CM | POA: Diagnosis present

## 2013-11-25 DIAGNOSIS — K7460 Unspecified cirrhosis of liver: Secondary | ICD-10-CM | POA: Insufficient documentation

## 2013-11-25 DIAGNOSIS — J189 Pneumonia, unspecified organism: Secondary | ICD-10-CM | POA: Diagnosis present

## 2013-11-25 DIAGNOSIS — Z9842 Cataract extraction status, left eye: Secondary | ICD-10-CM | POA: Diagnosis not present

## 2013-11-25 DIAGNOSIS — Z79899 Other long term (current) drug therapy: Secondary | ICD-10-CM

## 2013-11-25 DIAGNOSIS — G8929 Other chronic pain: Secondary | ICD-10-CM

## 2013-11-25 DIAGNOSIS — Z9841 Cataract extraction status, right eye: Secondary | ICD-10-CM

## 2013-11-25 DIAGNOSIS — K7469 Other cirrhosis of liver: Secondary | ICD-10-CM

## 2013-11-25 DIAGNOSIS — F17210 Nicotine dependence, cigarettes, uncomplicated: Secondary | ICD-10-CM | POA: Diagnosis present

## 2013-11-25 DIAGNOSIS — M549 Dorsalgia, unspecified: Secondary | ICD-10-CM

## 2013-11-25 DIAGNOSIS — K219 Gastro-esophageal reflux disease without esophagitis: Secondary | ICD-10-CM | POA: Diagnosis present

## 2013-11-25 DIAGNOSIS — R410 Disorientation, unspecified: Secondary | ICD-10-CM

## 2013-11-25 LAB — URINALYSIS, ROUTINE W REFLEX MICROSCOPIC
Bilirubin Urine: NEGATIVE
Glucose, UA: NEGATIVE mg/dL
Hgb urine dipstick: NEGATIVE
Ketones, ur: NEGATIVE mg/dL
LEUKOCYTES UA: NEGATIVE
NITRITE: NEGATIVE
PROTEIN: NEGATIVE mg/dL
SPECIFIC GRAVITY, URINE: 1.012 (ref 1.005–1.030)
UROBILINOGEN UA: 2 mg/dL — AB (ref 0.0–1.0)
pH: 8 (ref 5.0–8.0)

## 2013-11-25 LAB — CBC WITH DIFFERENTIAL/PLATELET
Basophils Absolute: 0.1 10*3/uL (ref 0.0–0.1)
Basophils Relative: 1 % (ref 0–1)
Eosinophils Absolute: 0.6 10*3/uL (ref 0.0–0.7)
Eosinophils Relative: 11 % — ABNORMAL HIGH (ref 0–5)
HEMATOCRIT: 23.8 % — AB (ref 39.0–52.0)
Hemoglobin: 8.1 g/dL — ABNORMAL LOW (ref 13.0–17.0)
LYMPHS PCT: 18 % (ref 12–46)
Lymphs Abs: 1 10*3/uL (ref 0.7–4.0)
MCH: 32.4 pg (ref 26.0–34.0)
MCHC: 34 g/dL (ref 30.0–36.0)
MCV: 95.2 fL (ref 78.0–100.0)
MONO ABS: 0.8 10*3/uL (ref 0.1–1.0)
Monocytes Relative: 14 % — ABNORMAL HIGH (ref 3–12)
NEUTROS ABS: 3 10*3/uL (ref 1.7–7.7)
Neutrophils Relative %: 55 % (ref 43–77)
Platelets: 72 10*3/uL — ABNORMAL LOW (ref 150–400)
RBC: 2.5 MIL/uL — ABNORMAL LOW (ref 4.22–5.81)
RDW: 20.5 % — ABNORMAL HIGH (ref 11.5–15.5)
WBC: 5.5 10*3/uL (ref 4.0–10.5)

## 2013-11-25 LAB — COMPREHENSIVE METABOLIC PANEL
ALBUMIN: 1.8 g/dL — AB (ref 3.5–5.2)
ALT: 41 U/L (ref 0–53)
ANION GAP: 11 (ref 5–15)
AST: 51 U/L — ABNORMAL HIGH (ref 0–37)
Alkaline Phosphatase: 172 U/L — ABNORMAL HIGH (ref 39–117)
BUN: 18 mg/dL (ref 6–23)
CALCIUM: 8.1 mg/dL — AB (ref 8.4–10.5)
CO2: 23 mEq/L (ref 19–32)
Chloride: 101 mEq/L (ref 96–112)
Creatinine, Ser: 1.14 mg/dL (ref 0.50–1.35)
GFR calc Af Amer: 74 mL/min — ABNORMAL LOW (ref >90)
GFR calc non Af Amer: 64 mL/min — ABNORMAL LOW (ref >90)
GLUCOSE: 84 mg/dL (ref 70–99)
POTASSIUM: 4.4 meq/L (ref 3.7–5.3)
SODIUM: 135 meq/L — AB (ref 137–147)
TOTAL PROTEIN: 5.5 g/dL — AB (ref 6.0–8.3)
Total Bilirubin: 3 mg/dL — ABNORMAL HIGH (ref 0.3–1.2)

## 2013-11-25 LAB — BLOOD GAS, VENOUS
ACID-BASE EXCESS: 1 mmol/L (ref 0.0–2.0)
BICARBONATE: 23.9 meq/L (ref 20.0–24.0)
O2 Saturation: 52.8 %
PCO2 VEN: 32.7 mmHg — AB (ref 45.0–50.0)
Patient temperature: 98.6
TCO2: 22.4 mmol/L (ref 0–100)
pH, Ven: 7.478 — ABNORMAL HIGH (ref 7.250–7.300)

## 2013-11-25 LAB — TYPE AND SCREEN
ABO/RH(D): O NEG
Antibody Screen: NEGATIVE

## 2013-11-25 LAB — I-STAT CG4 LACTIC ACID, ED: Lactic Acid, Venous: 1.49 mmol/L (ref 0.5–2.2)

## 2013-11-25 LAB — PROTIME-INR
INR: 1.54 — ABNORMAL HIGH (ref 0.00–1.49)
PROTHROMBIN TIME: 18.7 s — AB (ref 11.6–15.2)

## 2013-11-25 LAB — AMMONIA: AMMONIA: 68 umol/L — AB (ref 11–60)

## 2013-11-25 MED ORDER — HYDROMORPHONE HCL 1 MG/ML IJ SOLN: 1.0000 mg | Freq: Once | INTRAMUSCULAR | Status: DC

## 2013-11-25 MED ORDER — MORPHINE SULFATE 4 MG/ML IJ SOLN
4.0000 mg | Freq: Once | INTRAMUSCULAR | Status: AC
  Administered 2013-11-25: 4 mg via INTRAVENOUS
  Filled 2013-11-25: qty 1

## 2013-11-25 MED ORDER — SODIUM CHLORIDE 0.9 % IV SOLN
Freq: Once | INTRAVENOUS | Status: AC
  Administered 2013-11-25: 04:00:00 via INTRAVENOUS

## 2013-11-25 MED ORDER — VANCOMYCIN HCL 10 G IV SOLR
1500.0000 mg | Freq: Once | INTRAVENOUS | Status: DC
  Administered 2013-11-25: 1500 mg via INTRAVENOUS
  Filled 2013-11-25: qty 1500

## 2013-11-25 MED ORDER — CEFEPIME HCL 2 G IJ SOLR: 2.0000 g | Freq: Once | INTRAMUSCULAR | Status: DC

## 2013-11-25 NOTE — Discharge Instructions (Signed)
Chronic Back Pain Clifford Cook, you were seen today for chronic back pain.  You were given morphine and you can continue to take pain medication at home as prescribed.  Return to [facility] today for a paracentesis and besure to complete your antibiotics for your recent pneumonia.  If any symptoms worsen, come back to the ED for repeat evaluation.  Thank you.  When back pain lasts longer than 3 months, it is called chronic back pain.People with chronic back pain often go through certain periods that are more intense (flare-ups).  CAUSES Chronic back pain can be caused by wear and tear (degeneration) on different structures in your back. These structures include:  The bones of your spine (vertebrae) and the joints surrounding your spinal cord and nerve roots (facets).  The strong, fibrous tissues that connect your vertebrae (ligaments). Degeneration of these structures may result in pressure on your nerves. This can lead to constant pain. HOME CARE INSTRUCTIONS  Avoid bending, heavy lifting, prolonged sitting, and activities which make the problem worse.  Take brief periods of rest throughout the day to reduce your pain. Lying down or standing usually is better than sitting while you are resting.  Take over-the-counter or prescription medicines only as directed by your caregiver. SEEK IMMEDIATE MEDICAL CARE IF:   You have weakness or numbness in one of your legs or feet.  You have trouble controlling your bladder or bowels.  You have nausea, vomiting, abdominal pain, shortness of breath, or fainting. Document Released: 02/08/2004 Document Revised: 03/25/2011 Document Reviewed: 12/15/2010 [facility] Patient Information 2015 [place], [place]. This information is not intended to replace advice given to you by your health care provider. Make sure you discuss any questions you have with your health care provider.

## 2013-11-25 NOTE — ED Notes (Signed)
Report to [facility], ICU RN.

## 2013-11-25 NOTE — ED Notes (Signed)
Dr. Alcario Drought at bedside speaking with family.

## 2013-11-25 NOTE — Procedures (Signed)
Successful US guided paracentesis from RLQ.  Yielded 1.7L of clear yellow fluid.  No immediate complications.  Pt tolerated well.   Specimen was not sent for labs.  Ascencion Dike PA-C 11/25/2013 1:55 PM

## 2013-11-25 NOTE — ED Notes (Signed)
Communications notified of patient's need for transport back to facility.  Spoke with [facility].

## 2013-11-25 NOTE — ED Notes (Signed)
Patient is talkative and giddy at times.  Family reports that he had an elevated ammonia level yesterday which causes some confusion with the patient.

## 2013-11-25 NOTE — ED Notes (Signed)
Bed: WA05 Expected date:  Expected time:  Means of arrival:  Comments: EMS-weakness 

## 2013-11-25 NOTE — ED Notes (Signed)
Wife at bedside.  She reports she was at rehab facility with patient.  She states he was very restless and complaining of left sided abdominal pain.

## 2013-11-25 NOTE — ED Notes (Signed)
Clifford Cook from lab requests that all labs be redrawn.

## 2013-11-25 NOTE — ED Notes (Signed)
Per EMS, patient has been rehabbing at a facility and they called 911 when his weakness increased.  He has had several recent hospital visits for same.  Facility reports that patient has urinated today, wife states that he has not.

## 2013-11-25 NOTE — ED Notes (Signed)
Patient's wife and daughter verbalize to Dr. Alcario Drought that they would like to take patient home with home health as originally planned.

## 2013-11-25 NOTE — ED Provider Notes (Signed)
CSN: [id number]     Arrival date & time 11/25/13  0207 History   First MD Initiated Contact with Patient 11/25/13 0215     Chief Complaint  Patient presents with  . Weakness     (Consider location/radiation/quality/duration/timing/severity/associated sxs/prior Treatment) HPI  Clifford Cook is a 68 y.o. male with past medical history of liver cirrhosis presenting with weakness. Patient currently denies being in any pain. He is unable to tell me what brought him to the emergency department. Per nursing and EMS records, he was sent in for weakness. His wife also stated that he did not urinate today. Patient was recently admitted and discharged for pneumonia. He states he is still taking antibiotics through a PICC line. Patient tells me that he did in fact urinate today. He is denying any fevers, or changes in his bowel movements.    Past Medical History  Diagnosis Date  . Stomach ulcer   . GI bleed   . Liver disorder   . Cirrhosis   . GERD (gastroesophageal reflux disease)    Past Surgical History  Procedure Laterality Date  . Cataract extraction, bilateral    . Surgery for lazy eye      age 58  . Esophagogastroduodenoscopy N/A 04/16/2012    Procedure: ESOPHAGOGASTRODUODENOSCOPY (EGD);  Surgeon: Jeryl Columbia, MD;  Location: [facility];  Service: Endoscopy;  Laterality: N/A;  . Esophagogastroduodenoscopy N/A 04/17/2012    Procedure: ESOPHAGOGASTRODUODENOSCOPY (EGD);  Surgeon: Jeryl Columbia, MD;  Location: [facility];  Service: Endoscopy;  Laterality: N/A;  . Direct laryngoscopy N/A 07/13/2013    Procedure: DIRECT LARYNGOSCOPY WITH EXCISION TUMOR;  Surgeon: Rozetta Nunnery, MD;  Location: [facility];  Service: ENT;  Laterality: N/A;   History reviewed. No pertinent family history. History  Substance Use Topics  . Smoking status: Current Every Day Smoker -- 1.00 packs/day    Types: Cigarettes  . Smokeless tobacco: Never Used  . Alcohol Use: No     Comment: over i yr     Review of Systems  Unable to perform ROS: Acuity of condition      Allergies  Pulmicort and Penicillins  Home Medications   Prior to Admission medications   Medication Sig Start Date End Date Taking? Authorizing Provider  alendronate (FOSAMAX) 70 MG tablet Take 1 tablet (70 mg total) by mouth once a week. Hold until antibiotic therapy is completed 11/09/13   Barton Dubois, MD  diphenhydrAMINE (BENADRYL) 25 mg capsule Take 1 capsule (25 mg total) by mouth every 12 (twelve) hours as needed for itching. 11/09/13   Barton Dubois, MD  ertapenem 1 g in sodium chloride 0.9 % 50 mL Inject 1 g into the vein daily. To be given for 20 more days (last antibiotic day is 11/6) 11/09/13 11/29/13  Barton Dubois, MD  feeding supplement, RESOURCE BREEZE, (RESOURCE BREEZE) LIQD Take 1 Container by mouth 3 (three) times daily between meals. 11/09/13   Barton Dubois, MD  ferrous sulfate 325 (65 FE) MG tablet Take 1 tablet (325 mg total) by mouth 2 (two) times daily with a meal. 11/09/13   Barton Dubois, MD  furosemide (LASIX) 40 MG tablet Take 1 tablet (40 mg total) by mouth daily. 11/09/13   Barton Dubois, MD  HYDROmorphone (DILAUDID) 2 MG tablet Take 1-2 mg by mouth every 12 (twelve) hours as needed for severe pain.    Historical Provider, MD  Magnesium 250 MG TABS Take 250 mg by mouth daily.    Historical Provider,  MD  pantoprazole (PROTONIX) 40 MG tablet Take 1 tablet (40 mg total) by mouth daily. 11/09/13   Barton Dubois, MD  potassium chloride 20 MEQ/15ML (10%) solution Take 40 mEq by mouth daily.    Historical Provider, MD  sorbitol 70 % SOLN Take 20 mLs by mouth daily as needed for moderate constipation (goal is 1-2 good BM's daily). 11/09/13   Barton Dubois, MD  spironolactone (ALDACTONE) 50 MG tablet Take 1 tablet (50 mg total) by mouth daily. Patient taking differently: Take 100 mg by mouth daily. Take 2 --50 mg tabs to equal 100 mg QD total 11/09/13   Barton Dubois, MD  temazepam (RESTORIL)  7.5 MG capsule Take 1 capsule (7.5 mg total) by mouth at bedtime as needed for sleep. 11/09/13   Blanchie Serve, MD  traMADol (ULTRAM) 50 MG tablet Take 1 tablet (50 mg total) by mouth every 8 (eight) hours as needed for moderate pain or severe pain (pain). 11/09/13   Blanchie Serve, MD  Vitamin D, Ergocalciferol, (DRISDOL) 50000 UNITS CAPS capsule Take 50,000 Units by mouth every Wednesday.     Historical Provider, MD   BP 101/59 mmHg  Pulse 116  Temp(Src) 98.4 F (36.9 C) (Rectal)  Resp 22  SpO2 95% Physical Exam  Constitutional: He is oriented to person, place, and time. Vital signs are normal. He appears well-developed and well-nourished.  Non-toxic appearance. He does not appear ill. He appears distressed.  HENT:  Head: Normocephalic and atraumatic.  Nose: Nose normal.  Mouth/Throat: Oropharynx is clear and moist. No oropharyngeal exudate.  Eyes: Conjunctivae and EOM are normal. Pupils are equal, round, and reactive to light. No scleral icterus.  Neck: Normal range of motion. Neck supple. No tracheal deviation, no edema, no erythema and normal range of motion present. No thyroid mass and no thyromegaly present.  Cardiovascular: Regular rhythm, S1 normal, S2 normal, normal heart sounds, intact distal pulses and normal pulses.  Exam reveals no gallop and no friction rub.   No murmur heard. Pulses:      Radial pulses are 2+ on the right side, and 2+ on the left side.       Dorsalis pedis pulses are 2+ on the right side, and 2+ on the left side.  tachycardic  Pulmonary/Chest: Breath sounds normal. He is in respiratory distress. He has no wheezes. He has no rhonchi. He has no rales.  tachypnea, use of accessory muscles.audible wheezing heard, but no wheezing heard in lung fields with stethoscope.  Abdominal: Soft. Normal appearance and bowel sounds are normal. He exhibits no distension, no ascites and no mass. There is no hepatosplenomegaly. There is no tenderness. There is no rebound, no  guarding and no CVA tenderness.  Right lower quadrant bag attached to collect ascitic fluid leaking through the skin.  Musculoskeletal: Normal range of motion. He exhibits no edema or tenderness.  Left upper extremity PICC line, no signs of infection.  Lymphadenopathy:    He has no cervical adenopathy.  Neurological: He is alert and oriented to person, place, and time. He has normal strength. No sensory deficit. GCS eye subscore is 4. GCS verbal subscore is 5. GCS motor subscore is 6.  Skin: Skin is warm, dry and intact. No petechiae and no rash noted. He is not diaphoretic. No erythema. No pallor.  Tactile fever  Nursing note and vitals reviewed.   ED Course  Procedures (including critical care time) Labs Review Labs Reviewed  COMPREHENSIVE METABOLIC PANEL - Abnormal; Notable for the following:  Sodium 135 (*)    Calcium 8.1 (*)    Total Protein 5.5 (*)    Albumin 1.8 (*)    AST 51 (*)    Alkaline Phosphatase 172 (*)    Total Bilirubin 3.0 (*)    GFR calc non Af Amer 64 (*)    GFR calc Af Amer 74 (*)    All other components within normal limits  URINALYSIS, ROUTINE W REFLEX MICROSCOPIC - Abnormal; Notable for the following:    APPearance CLOUDY (*)    Urobilinogen, UA 2.0 (*)    All other components within normal limits  BLOOD GAS, VENOUS - Abnormal; Notable for the following:    pH, Ven 7.478 (*)    pCO2, Ven 32.7 (*)    All other components within normal limits  AMMONIA - Abnormal; Notable for the following:    Ammonia 68 (*)    All other components within normal limits  CBC WITH DIFFERENTIAL - Abnormal; Notable for the following:    RBC 2.50 (*)    Hemoglobin 8.1 (*)    HCT 23.8 (*)    RDW 20.5 (*)    Platelets 72 (*)    Monocytes Relative 14 (*)    Eosinophils Relative 11 (*)    All other components within normal limits  PROTIME-INR - Abnormal; Notable for the following:    Prothrombin Time 18.7 (*)    INR 1.54 (*)    All other components within normal limits   CULTURE, BLOOD (ROUTINE X 2)  CULTURE, BLOOD (ROUTINE X 2)  URINE CULTURE  CBC WITH DIFFERENTIAL  I-STAT CG4 LACTIC ACID, ED  I-STAT CG4 LACTIC ACID, ED  TYPE AND SCREEN    Imaging Review Dg Chest Port 1 View  11/25/2013   CLINICAL DATA:  Weakness and shortness of breath.  EXAM: PORTABLE CHEST - 1 VIEW  COMPARISON:  PA and lateral chest 11/15/2013.  FINDINGS: Small bilateral pleural effusions on the comparison study of increased. Bilateral interstitial opacities are identified. No pneumothorax is seen. Heart size is normal.  IMPRESSION: Bilateral interstitial opacities could be due to edema or infection.  Some increase in small bilateral pleural effusions, greater on the left.   Electronically Signed   By: Inge Rise M.D.   On: 11/25/2013 03:09     EKG Interpretation   Date/Time:  Thursday November 25 2013 02:13:07 EST Ventricular Rate:  113 PR Interval:    QRS Duration: 110 QT Interval:  343 QTC Calculation: 470 R Axis:   -62 Text Interpretation:  Atrial fibrillation LAD, consider left anterior  fascicular block Low voltage, extremity leads Abnormal R-wave progression  No significant change since last tracing Confirmed by Glynn Octave [phone]) on 11/25/2013 3:06:54 AM      MDM   Final diagnoses:  None    Patient presents emergency department for weakness.  Patient is very warm to the touch concerning for fever. He's also tachycardic. We'll treat as sepsis. Protocol has been ordered. He'll be given fluids, vancomycin, cefepime for treatment.  Rectal temperature is 98.4 however family states he never runs a fever.  Will hold on antibiotics for right now as he does not meet SIRS criteria. He was still given fluids. Family is more so concerned for his confusion and decreased number of bowel movements per day.CBC reveals a hemoglobin of 3. I am unsure if this is real and will send another CBC with type and screen.  I was told by staff that initial laboratory  studies  were drawn from the PICC line and were diluted. Currently resending all laboratory studies.  Repeat CBC 8.  Upon repeat discussion with family, they do not wish for admission.  He has a scheduled para today at the hospital and has another dose of ertapenem at his facility for previously diagnosed pneumonia.  Patient was given morphine for pain, his labs are at baseline and his vitals are too.  Patient is safe for discharge.  Everlene Balls, MD 11/25/13 1431

## 2013-11-25 NOTE — ED Notes (Signed)
Report called to [facility], nurse at [facility].

## 2013-11-25 NOTE — ED Notes (Signed)
Dr. Oni at bedside. 

## 2013-11-26 LAB — URINE CULTURE
Colony Count: NO GROWTH
Culture: NO GROWTH

## 2013-11-28 ENCOUNTER — Encounter: Payer: Self-pay | Admitting: Internal Medicine

## 2013-11-28 ENCOUNTER — Non-Acute Institutional Stay (SKILLED_NURSING_FACILITY): Payer: Medicare Other | Admitting: Internal Medicine

## 2013-11-28 DIAGNOSIS — R410 Disorientation, unspecified: Secondary | ICD-10-CM

## 2013-11-28 DIAGNOSIS — R531 Weakness: Secondary | ICD-10-CM

## 2013-11-28 DIAGNOSIS — R188 Other ascites: Secondary | ICD-10-CM

## 2013-11-28 NOTE — Assessment & Plan Note (Signed)
Pt has been reported to be with increased confusion. Workup was done to eval for possible infection as the source and ammonia. Pt was given lactulose pending results. Ammonia came back at 215. Pt's decline is more precipitous now, will need lactulose regularly, written for 2ml TID.

## 2013-11-28 NOTE — Progress Notes (Signed)
MRN: [id number] Name: Clifford Cook  Sex: male Age: 68 y.o. DOB: 1945/11/29  [facility] #: [facility] Facility/Room: 515 Level Of Care: SNF Provider: Inocencio Homes D Emergency Contacts: Extended Emergency Contact Information Primary Emergency Contact: Formby,Susan Address: [address]          [place], [place] [address] [place] Phone: [phone] Relation: Spouse Secondary Emergency Contact: Kelly Splinter Address: [address]          [place], [place] [address] [place] Phone: [phone] Work Phone: [phone] Mobile Phone: [phone] Relation: Daughter     Allergies: Pulmicort and Penicillins  Chief Complaint  Patient presents with  . Medical Management of Chronic Issues    HPI: Patient is 68 y.o. male who is being discharged tomorrow. I am following up on pt's mental status and new concerns for pt's ascites.  Past Medical History  Diagnosis Date  . Stomach ulcer   . GI bleed   . Liver disorder   . Cirrhosis   . GERD (gastroesophageal reflux disease)     Past Surgical History  Procedure Laterality Date  . Cataract extraction, bilateral    . Surgery for lazy eye      age 68  . Esophagogastroduodenoscopy N/A 04/16/2012    Procedure: ESOPHAGOGASTRODUODENOSCOPY (EGD);  Surgeon: Jeryl Columbia, MD;  Location: [facility];  Service: Endoscopy;  Laterality: N/A;  . Esophagogastroduodenoscopy N/A 04/17/2012    Procedure: ESOPHAGOGASTRODUODENOSCOPY (EGD);  Surgeon: Jeryl Columbia, MD;  Location: [facility];  Service: Endoscopy;  Laterality: N/A;  . Direct laryngoscopy N/A 07/13/2013    Procedure: DIRECT LARYNGOSCOPY WITH EXCISION TUMOR;  Surgeon: Rozetta Nunnery, MD;  Location: [facility];  Service: ENT;  Laterality: N/A;      Medication List       This list is accurate as of: 11/28/13  1:49 PM.  Always use your most recent med list.               alendronate 70 MG tablet  Commonly known as:  FOSAMAX  Take 1  tablet (70 mg total) by mouth once a week. Hold until antibiotic therapy is completed     diphenhydrAMINE 25 mg capsule  Commonly known as:  BENADRYL  Take 1 capsule (25 mg total) by mouth every 12 (twelve) hours as needed for itching.     ertapenem 1 g in sodium chloride 0.9 % 50 mL  Inject 1 g into the vein daily. To be given for 20 more days (last antibiotic day is 11/6)     feeding supplement (RESOURCE BREEZE) Liqd  Take 1 Container by mouth 3 (three) times daily between meals.     ferrous sulfate 325 (65 FE) MG tablet  Take 1 tablet (325 mg total) by mouth 2 (two) times daily with a meal.     furosemide 40 MG tablet  Commonly known as:  LASIX  Take 1 tablet (40 mg total) by mouth daily.     HYDROmorphone 2 MG tablet  Commonly known as:  DILAUDID  Take 1-2 mg by mouth every 12 (twelve) hours as needed for severe pain.     lactulose 10 GM/15ML solution  Commonly known as:  CHRONULAC  Take 15 g by mouth 3 (three) times daily.     Magnesium 250 MG Tabs  Take 250 mg by mouth daily.     pantoprazole 40 MG tablet  Commonly known as:  PROTONIX  Take 1 tablet (40 mg total) by  mouth daily.     potassium chloride 20 MEQ/15ML (10%) solution  Take 40 mEq by mouth daily.     potassium chloride SA 20 MEQ tablet  Commonly known as:  K-DUR,KLOR-CON  Take 40 mEq by mouth daily.     sorbitol 70 % Soln  Take 20 mLs by mouth daily as needed for moderate constipation (goal is 1-2 good BM's daily).     spironolactone 50 MG tablet  Commonly known as:  ALDACTONE  Take 1 tablet (50 mg total) by mouth daily.     temazepam 7.5 MG capsule  Commonly known as:  RESTORIL  Take 1 capsule (7.5 mg total) by mouth at bedtime as needed for sleep.     traMADol 50 MG tablet  Commonly known as:  ULTRAM  Take 1 tablet (50 mg total) by mouth every 8 (eight) hours as needed for moderate pain or severe pain (pain).     Vitamin D (Ergocalciferol) 50000 UNITS Caps capsule  Commonly known as:  DRISDOL   Take 50,000 Units by mouth every Wednesday.        No orders of the defined types were placed in this encounter.     There is no immunization history on file for this patient.  History  Substance Use Topics  . Smoking status: Current Every Day Smoker -- 1.00 packs/day    Types: Cigarettes  . Smokeless tobacco: Never Used  . Alcohol Use: No     Comment: over i yr    Review of Systems  DATA OBTAINED: from patient, nurse, daughter GENERAL:  no fevers, fatigue, appetite changes SKIN: No itching, rash HEENT: No complaint RESPIRATORY: No cough, wheezing, SOB CARDIAC: No chest pain, palpitations,+ lower extremity edema  GI: No abdominal pain, No N/V/D or constipation, No heartburn or reflux; increased girth by 2 cm from yesterday AND paracentesis site that had been draining into colostomy bag at least > 500 cc daily is closed GU: No dysuria, frequency or urgency, or incontinence  MUSCULOSKELETAL: No unrelieved bone/joint pain NEUROLOGIC: No headache, dizziness  PSYCHIATRIC: No overt anxiety or sadness; confusion improved  Filed Vitals:   11/28/13 1347  BP: 102/65  Pulse: 110  Temp: 97.8 F (36.6 C)  Resp: 20    Physical Exam  GENERAL APPEARANCE: Alert, conversant, No acute distress  SKIN: No diaphoresis rash HEENT: Unremarkable RESPIRATORY: Breathing is even, unlabored. Lung sounds are clear   CARDIOVASCULAR: Heart RRR no murmurs, rubs or gallops. 2+ peripheral edema  GASTROINTESTINAL: Abdomen is soft, non-tender, distended w/ normal bowel sounds.  GENITOURINARY: Bladder non tender, not distended  MUSCULOSKELETAL: No abnormal joints or musculature NEUROLOGIC: Cranial nerves 2-12 grossly intact. Moves all extremities PSYCHIATRIC: MS appears near baseline  Patient Active Problem List   Diagnosis Date Noted  . Acute confusion 11/25/2013  . Coagulopathy 11/12/2013  . General weakness 11/12/2013  . Bleeding from PICC line 11/11/2013  . Hypokalemia 11/11/2013  .  Sepsis 10/31/2013  . Liver cirrhosis secondary to NASH 10/31/2013  . Pneumonia due to streptococcus, group A 10/31/2013  . Ascites 10/31/2013  . Elevated troponin 10/31/2013  . Generalized weakness 10/31/2013  . Hyponatremia 10/31/2013  . Anemia 10/31/2013  . Thrombocytopenia 10/31/2013  . Respiratory failure with hypoxia 10/31/2013  . Rib fracture 10/31/2013  . H/O: GI bleed 04/16/2012  . Tobacco abuse 04/16/2012    CBC    Component Value Date/Time   WBC 5.5 11/25/2013 0420   RBC 2.50* 11/25/2013 0420   HGB 8.1* 11/25/2013 0420  HCT 23.8* 11/25/2013 0420   PLT 72* 11/25/2013 0420   MCV 95.2 11/25/2013 0420   LYMPHSABS 1.0 11/25/2013 0420   MONOABS 0.8 11/25/2013 0420   EOSABS 0.6 11/25/2013 0420   BASOSABS 0.1 11/25/2013 0420    CMP     Component Value Date/Time   NA 135* 11/25/2013 0251   K 4.4 11/25/2013 0251   CL 101 11/25/2013 0251   CO2 23 11/25/2013 0251   GLUCOSE 84 11/25/2013 0251   BUN 18 11/25/2013 0251   CREATININE 1.14 11/25/2013 0251   CALCIUM 8.1* 11/25/2013 0251   PROT 5.5* 11/25/2013 0251   ALBUMIN 1.8* 11/25/2013 0251   AST 51* 11/25/2013 0251   ALT 41 11/25/2013 0251   ALKPHOS 172* 11/25/2013 0251   BILITOT 3.0* 11/25/2013 0251   GFRNONAA 64* 11/25/2013 0251   GFRAA 74* 11/25/2013 0251    Assessment and Plan  No problem-specific assessment & plan notes found for this encounter.  Date of service 11/24/2013 Hennie Duos, MD

## 2013-11-28 NOTE — Assessment & Plan Note (Signed)
Pt's decline is more precipitous now. Expect his weakness to continue and probably worsen.

## 2013-11-28 NOTE — Assessment & Plan Note (Signed)
Pt will require paracentesis on a regular basis until the end. Per pt family pt's GI MD is not on board fully with this so we have been making arrangments. The leaking paracentesis site had turned out to be a blessing but it has finally closed. Even though pt not c/o SOB more than usual CXR was read with small  B effusions, so paracentesis is priority. Fortunately we were able to get pt set up for tomorrow.

## 2013-12-01 LAB — CULTURE, BLOOD (ROUTINE X 2)
CULTURE: NO GROWTH
CULTURE: NO GROWTH

## 2013-12-05 ENCOUNTER — Encounter (HOSPITAL_COMMUNITY): Payer: Self-pay | Admitting: Emergency Medicine

## 2013-12-05 ENCOUNTER — Emergency Department (HOSPITAL_COMMUNITY): Payer: Medicare Other

## 2013-12-05 ENCOUNTER — Inpatient Hospital Stay (HOSPITAL_COMMUNITY): Payer: Medicare Other

## 2013-12-05 ENCOUNTER — Inpatient Hospital Stay (HOSPITAL_COMMUNITY)
Admission: EM | Admit: 2013-12-05 | Discharge: 2013-12-14 | DRG: 853 | Disposition: A | Discharge status: Home Health | Payer: Medicare Other | Attending: Family Medicine | Admitting: Family Medicine

## 2013-12-05 DIAGNOSIS — Q211 Atrial septal defect: Secondary | ICD-10-CM | POA: Diagnosis not present

## 2013-12-05 DIAGNOSIS — R000 Tachycardia, unspecified: Secondary | ICD-10-CM | POA: Diagnosis present

## 2013-12-05 DIAGNOSIS — K7460 Unspecified cirrhosis of liver: Secondary | ICD-10-CM | POA: Diagnosis present

## 2013-12-05 DIAGNOSIS — K219 Gastro-esophageal reflux disease without esophagitis: Secondary | ICD-10-CM | POA: Diagnosis present

## 2013-12-05 DIAGNOSIS — D696 Thrombocytopenia, unspecified: Secondary | ICD-10-CM | POA: Diagnosis present

## 2013-12-05 DIAGNOSIS — D689 Coagulation defect, unspecified: Secondary | ICD-10-CM | POA: Diagnosis present

## 2013-12-05 DIAGNOSIS — E871 Hypo-osmolality and hyponatremia: Secondary | ICD-10-CM | POA: Diagnosis present

## 2013-12-05 DIAGNOSIS — K7290 Hepatic failure, unspecified without coma: Secondary | ICD-10-CM | POA: Diagnosis present

## 2013-12-05 DIAGNOSIS — D6959 Other secondary thrombocytopenia: Secondary | ICD-10-CM | POA: Diagnosis present

## 2013-12-05 DIAGNOSIS — I4892 Unspecified atrial flutter: Secondary | ICD-10-CM | POA: Diagnosis present

## 2013-12-05 DIAGNOSIS — K7581 Nonalcoholic steatohepatitis (NASH): Secondary | ICD-10-CM | POA: Diagnosis present

## 2013-12-05 DIAGNOSIS — R188 Other ascites: Secondary | ICD-10-CM | POA: Diagnosis present

## 2013-12-05 DIAGNOSIS — D509 Iron deficiency anemia, unspecified: Secondary | ICD-10-CM | POA: Diagnosis present

## 2013-12-05 DIAGNOSIS — F17210 Nicotine dependence, cigarettes, uncomplicated: Secondary | ICD-10-CM | POA: Diagnosis present

## 2013-12-05 DIAGNOSIS — R6510 Systemic inflammatory response syndrome (SIRS) of non-infectious origin without acute organ dysfunction: Secondary | ICD-10-CM | POA: Diagnosis present

## 2013-12-05 DIAGNOSIS — I4891 Unspecified atrial fibrillation: Secondary | ICD-10-CM | POA: Diagnosis present

## 2013-12-05 DIAGNOSIS — Z8719 Personal history of other diseases of the digestive system: Secondary | ICD-10-CM

## 2013-12-05 DIAGNOSIS — I472 Ventricular tachycardia: Secondary | ICD-10-CM | POA: Diagnosis present

## 2013-12-05 DIAGNOSIS — S72002A Fracture of unspecified part of neck of left femur, initial encounter for closed fracture: Secondary | ICD-10-CM

## 2013-12-05 DIAGNOSIS — S72009A Fracture of unspecified part of neck of unspecified femur, initial encounter for closed fracture: Secondary | ICD-10-CM

## 2013-12-05 DIAGNOSIS — K7682 Hepatic encephalopathy: Secondary | ICD-10-CM

## 2013-12-05 DIAGNOSIS — Z8701 Personal history of pneumonia (recurrent): Secondary | ICD-10-CM

## 2013-12-05 DIAGNOSIS — T148XXA Other injury of unspecified body region, initial encounter: Secondary | ICD-10-CM

## 2013-12-05 DIAGNOSIS — I959 Hypotension, unspecified: Secondary | ICD-10-CM | POA: Diagnosis present

## 2013-12-05 DIAGNOSIS — R410 Disorientation, unspecified: Secondary | ICD-10-CM | POA: Diagnosis present

## 2013-12-05 DIAGNOSIS — I5033 Acute on chronic diastolic (congestive) heart failure: Secondary | ICD-10-CM | POA: Diagnosis present

## 2013-12-05 DIAGNOSIS — E872 Acidosis, unspecified: Secondary | ICD-10-CM | POA: Diagnosis present

## 2013-12-05 DIAGNOSIS — I253 Aneurysm of heart: Secondary | ICD-10-CM | POA: Diagnosis present

## 2013-12-05 DIAGNOSIS — R531 Weakness: Secondary | ICD-10-CM

## 2013-12-05 DIAGNOSIS — D6489 Other specified anemias: Secondary | ICD-10-CM

## 2013-12-05 DIAGNOSIS — B954 Other streptococcus as the cause of diseases classified elsewhere: Secondary | ICD-10-CM | POA: Diagnosis present

## 2013-12-05 DIAGNOSIS — A419 Sepsis, unspecified organism: Secondary | ICD-10-CM | POA: Diagnosis present

## 2013-12-05 DIAGNOSIS — B955 Unspecified streptococcus as the cause of diseases classified elsewhere: Secondary | ICD-10-CM

## 2013-12-05 DIAGNOSIS — W010XXA Fall on same level from slipping, tripping and stumbling without subsequent striking against object, initial encounter: Secondary | ICD-10-CM | POA: Diagnosis present

## 2013-12-05 DIAGNOSIS — D638 Anemia in other chronic diseases classified elsewhere: Secondary | ICD-10-CM | POA: Diagnosis present

## 2013-12-05 DIAGNOSIS — R7881 Bacteremia: Secondary | ICD-10-CM | POA: Diagnosis present

## 2013-12-05 HISTORY — DX: Fracture of unspecified part of neck of unspecified femur, initial encounter for closed fracture: S72.009A

## 2013-12-05 LAB — CBC WITH DIFFERENTIAL/PLATELET
Basophils Absolute: 0.1 10*3/uL (ref 0.0–0.1)
Basophils Relative: 1 % (ref 0–1)
EOS ABS: 0.2 10*3/uL (ref 0.0–0.7)
Eosinophils Relative: 4 % (ref 0–5)
HCT: 23.9 % — ABNORMAL LOW (ref 39.0–52.0)
Hemoglobin: 8.1 g/dL — ABNORMAL LOW (ref 13.0–17.0)
LYMPHS ABS: 0.8 10*3/uL (ref 0.7–4.0)
Lymphocytes Relative: 13 % (ref 12–46)
MCH: 32.1 pg (ref 26.0–34.0)
MCHC: 33.9 g/dL (ref 30.0–36.0)
MCV: 94.8 fL (ref 78.0–100.0)
Monocytes Absolute: 0.8 10*3/uL (ref 0.1–1.0)
Monocytes Relative: 13 % — ABNORMAL HIGH (ref 3–12)
NEUTROS PCT: 69 % (ref 43–77)
Neutro Abs: 4.4 10*3/uL (ref 1.7–7.7)
PLATELETS: DECREASED 10*3/uL (ref 150–400)
RBC: 2.52 MIL/uL — AB (ref 4.22–5.81)
RDW: 18.8 % — ABNORMAL HIGH (ref 11.5–15.5)
WBC: 6.3 10*3/uL (ref 4.0–10.5)

## 2013-12-05 LAB — URINALYSIS, ROUTINE W REFLEX MICROSCOPIC
Bilirubin Urine: NEGATIVE
Glucose, UA: NEGATIVE mg/dL
Hgb urine dipstick: NEGATIVE
Ketones, ur: NEGATIVE mg/dL
LEUKOCYTES UA: NEGATIVE
Nitrite: NEGATIVE
PROTEIN: NEGATIVE mg/dL
Specific Gravity, Urine: 1.008 (ref 1.005–1.030)
UROBILINOGEN UA: 1 mg/dL (ref 0.0–1.0)
pH: 7 (ref 5.0–8.0)

## 2013-12-05 LAB — COMPREHENSIVE METABOLIC PANEL
ALT: 34 U/L (ref 0–53)
ANION GAP: 12 (ref 5–15)
AST: 40 U/L — ABNORMAL HIGH (ref 0–37)
Albumin: 1.8 g/dL — ABNORMAL LOW (ref 3.5–5.2)
Alkaline Phosphatase: 166 U/L — ABNORMAL HIGH (ref 39–117)
BUN: 14 mg/dL (ref 6–23)
CALCIUM: 8.1 mg/dL — AB (ref 8.4–10.5)
CO2: 26 mEq/L (ref 19–32)
CREATININE: 1.16 mg/dL (ref 0.50–1.35)
Chloride: 95 mEq/L — ABNORMAL LOW (ref 96–112)
GFR, EST AFRICAN AMERICAN: 72 mL/min — AB (ref >90)
GFR, EST NON AFRICAN AMERICAN: 62 mL/min — AB (ref >90)
GLUCOSE: 146 mg/dL — AB (ref 70–99)
Potassium: 4.4 mEq/L (ref 3.7–5.3)
Sodium: 133 mEq/L — ABNORMAL LOW (ref 137–147)
Total Bilirubin: 2.3 mg/dL — ABNORMAL HIGH (ref 0.3–1.2)
Total Protein: 5.5 g/dL — ABNORMAL LOW (ref 6.0–8.3)

## 2013-12-05 LAB — LACTIC ACID, PLASMA: LACTIC ACID, VENOUS: 2.5 mmol/L — AB (ref 0.5–2.2)

## 2013-12-05 LAB — TYPE AND SCREEN
ABO/RH(D): O NEG
Antibody Screen: NEGATIVE

## 2013-12-05 LAB — PROTIME-INR
INR: 1.5 — ABNORMAL HIGH (ref 0.00–1.49)
Prothrombin Time: 18.2 seconds — ABNORMAL HIGH (ref 11.6–15.2)

## 2013-12-05 LAB — TROPONIN I: Troponin I: <0.3 ng/mL (ref <0.30)

## 2013-12-05 LAB — AMMONIA: Ammonia: 95 umol/L — ABNORMAL HIGH (ref 11–60)

## 2013-12-05 LAB — APTT: APTT: 43 s — AB (ref 24–37)

## 2013-12-05 MED ORDER — SODIUM CHLORIDE 0.9 % IV SOLN
500.0000 mg | Freq: Three times a day (TID) | INTRAVENOUS | Status: DC
  Administered 2013-12-06 (×2): 500 mg via INTRAVENOUS
  Filled 2013-12-05 (×4): qty 500

## 2013-12-05 MED ORDER — SODIUM CHLORIDE 0.9 % IV BOLUS (SEPSIS)
250.0000 mL | Freq: Once | INTRAVENOUS | Status: AC
  Administered 2013-12-05: 250 mL via INTRAVENOUS

## 2013-12-05 MED ORDER — SODIUM CHLORIDE 0.9 % IV SOLN
500.0000 mg | INTRAVENOUS | Status: AC
  Administered 2013-12-05: 500 mg via INTRAVENOUS
  Filled 2013-12-05: qty 500

## 2013-12-05 MED ORDER — TEMAZEPAM 7.5 MG PO CAPS
7.5000 mg | ORAL_CAPSULE | Freq: Every evening | ORAL | Status: DC | PRN
  Administered 2013-12-05: 7.5 mg via ORAL
  Filled 2013-12-05: qty 1

## 2013-12-05 MED ORDER — LACTULOSE 10 GM/15ML PO SOLN
20.0000 g | Freq: Three times a day (TID) | ORAL | Status: DC
  Administered 2013-12-05 – 2013-12-14 (×23): 20 g via ORAL
  Filled 2013-12-05 (×29): qty 30

## 2013-12-05 MED ORDER — VITAMIN K1 10 MG/ML IJ SOLN
5.0000 mg | Freq: Once | INTRAVENOUS | Status: AC
  Administered 2013-12-05: 5 mg via INTRAVENOUS
  Filled 2013-12-05: qty 0.5

## 2013-12-05 MED ORDER — URSODIOL 300 MG PO CAPS
300.0000 mg | ORAL_CAPSULE | Freq: Two times a day (BID) | ORAL | Status: DC
  Administered 2013-12-05 – 2013-12-14 (×17): 300 mg via ORAL
  Filled 2013-12-05 (×20): qty 1

## 2013-12-05 MED ORDER — MAGNESIUM OXIDE 400 (241.3 MG) MG PO TABS
400.0000 mg | ORAL_TABLET | Freq: Every day | ORAL | Status: DC
  Administered 2013-12-07 – 2013-12-14 (×8): 400 mg via ORAL
  Filled 2013-12-05 (×9): qty 1

## 2013-12-05 MED ORDER — PANTOPRAZOLE SODIUM 40 MG PO TBEC
40.0000 mg | DELAYED_RELEASE_TABLET | Freq: Every day | ORAL | Status: DC
  Administered 2013-12-07 – 2013-12-14 (×8): 40 mg via ORAL
  Filled 2013-12-05 (×8): qty 1

## 2013-12-05 MED ORDER — HYDROMORPHONE HCL 2 MG PO TABS: 1.0000 mg | ORAL_TABLET | Freq: Two times a day (BID) | ORAL | Status: DC | PRN

## 2013-12-05 MED ORDER — VITAMIN D (ERGOCALCIFEROL) 1.25 MG (50000 UNIT) PO CAPS
50000.0000 [IU] | ORAL_CAPSULE | ORAL | Status: DC
  Administered 2013-12-08: 50000 [IU] via ORAL
  Filled 2013-12-05: qty 1

## 2013-12-05 MED ORDER — SODIUM CHLORIDE 0.9 % IJ SOLN
3.0000 mL | Freq: Two times a day (BID) | INTRAMUSCULAR | Status: DC
  Administered 2013-12-07 – 2013-12-12 (×8): 3 mL via INTRAVENOUS

## 2013-12-05 MED ORDER — VANCOMYCIN HCL IN DEXTROSE 1-5 GM/200ML-% IV SOLN
1000.0000 mg | Freq: Two times a day (BID) | INTRAVENOUS | Status: DC
  Administered 2013-12-06 – 2013-12-07 (×4): 1000 mg via INTRAVENOUS
  Filled 2013-12-05 (×5): qty 200

## 2013-12-05 MED ORDER — DIPHENHYDRAMINE HCL 12.5 MG/5ML PO ELIX
6.2500 mg | ORAL_SOLUTION | Freq: Four times a day (QID) | ORAL | Status: DC | PRN
  Administered 2013-12-05 – 2013-12-09 (×5): 6.25 mg via ORAL
  Filled 2013-12-05 (×8): qty 5

## 2013-12-05 MED ORDER — VANCOMYCIN HCL 10 G IV SOLR
2000.0000 mg | INTRAVENOUS | Status: AC
  Administered 2013-12-05: 2000 mg via INTRAVENOUS
  Filled 2013-12-05: qty 2000

## 2013-12-05 MED ORDER — FERROUS SULFATE 325 (65 FE) MG PO TABS
325.0000 mg | ORAL_TABLET | Freq: Two times a day (BID) | ORAL | Status: DC
  Administered 2013-12-06 – 2013-12-14 (×16): 325 mg via ORAL
  Filled 2013-12-05 (×20): qty 1

## 2013-12-05 MED ORDER — DIPHENHYDRAMINE HCL 12.5 MG/5ML PO ELIX: 12.5000 mg | ORAL_SOLUTION | Freq: Four times a day (QID) | ORAL | Status: DC | PRN

## 2013-12-05 MED ORDER — BOOST / RESOURCE BREEZE PO LIQD
1.0000 | Freq: Three times a day (TID) | ORAL | Status: DC
  Administered 2013-12-07 – 2013-12-09 (×3): 1 via ORAL

## 2013-12-05 NOTE — H&P (Addendum)
History and Physical   CULLIN DISHMAN YIR:[id number] DOB: [birth year] DOA: 12/05/2013  Referring physician: Dr. Kathrynn Humble  PCP: [name], MD  Specialists: orthopedic surgery, Dr. Percell Miller  Chief Complaint: fall  HPI: Clifford Cook is a 68 y.o. male has a past medical history significant for liver cirrhosis (?NASH), recent hospitalization with sepsis due to PNA with strep viridans bacteremia, discharged on 4 weeks on Invanz (just finished antibiotics last week), prior GI bleed, recurrent ascites, coagulopathy, comes in to the ED after a mechanical fall yesterday and with left hip pain. He denies any fevers or chills, denies any shortness of breath, congestion or cough, denies chest pain. He has mild abdominal discomfort due to his ascites however appreciates that his abdomen feels better than in the past. He denies any nausea or vomiting and denies any blood in his stool or dark tarry stools. He has a history of hepatic encephalopathy and an episode of confusion last week however per family he is clear now and at baseline. In the ED, patient was found to have a left femoral neck fracture, Dr. Percell Miller was consulted by EDP and plans surgical repair tomorrow and [facility] asked for admission. He is tachycardic to 120s, hypotensive to 50-09F systolic, however family says that this is his baseline. He is rather asymptomatic with this and clinically he looks well. On blood work he has mild hyponatremia, normal renal function, mildly elevated bilirubin, anemia with a hemoglobin of 8.1, borderline elevation of lactic acid 2.5 and INR of 1.5.   Review of Systems: As per history of present illness, otherwise negative   Past Medical History  Diagnosis Date  . Stomach ulcer   . GI bleed   . Liver disorder   . Cirrhosis   . GERD (gastroesophageal reflux disease)    Past Surgical History  Procedure Laterality Date  . Cataract extraction, bilateral    . Surgery for lazy eye      age 66  .  Esophagogastroduodenoscopy N/A 04/16/2012    Procedure: ESOPHAGOGASTRODUODENOSCOPY (EGD);  Surgeon: Jeryl Columbia, MD;  Location: [facility];  Service: Endoscopy;  Laterality: N/A;  . Esophagogastroduodenoscopy N/A 04/17/2012    Procedure: ESOPHAGOGASTRODUODENOSCOPY (EGD);  Surgeon: Jeryl Columbia, MD;  Location: [facility];  Service: Endoscopy;  Laterality: N/A;  . Direct laryngoscopy N/A 07/13/2013    Procedure: DIRECT LARYNGOSCOPY WITH EXCISION TUMOR;  Surgeon: Rozetta Nunnery, MD;  Location: [facility];  Service: ENT;  Laterality: N/A;   Social History:  reports that he has been smoking Cigarettes.  He has been smoking about 1.00 pack per day. He has never used smokeless tobacco. He reports that he does not drink alcohol or use illicit drugs.  Allergies  Allergen Reactions  . Pulmicort [Budesonide] Shortness Of Breath    Causes stridor  . Penicillins Other (See Comments)    Rash hives, white spots.    Prior to Admission medications   Medication Sig Start Date End Date Taking? Authorizing Provider  alendronate (FOSAMAX) 70 MG tablet Take 1 tablet (70 mg total) by mouth once a week. Hold until antibiotic therapy is completed 11/09/13  Yes Barton Dubois, MD  diphenhydrAMINE (BENADRYL) 25 mg capsule Take 1 capsule (25 mg total) by mouth every 12 (twelve) hours as needed for itching. 11/09/13  Yes Barton Dubois, MD  ferrous sulfate 325 (65 FE) MG tablet Take 1 tablet (325 mg total) by mouth 2 (two) times daily with a meal. 11/09/13  Yes Barton Dubois, MD  furosemide (LASIX) 40 MG tablet Take 1 tablet (40 mg total) by mouth daily. 11/09/13  Yes Barton Dubois, MD  HYDROmorphone (DILAUDID) 2 MG tablet Take 1-2 mg by mouth every 12 (twelve) hours as needed for severe pain.   Yes Historical Provider, MD  lactulose (CHRONULAC) 10 GM/15ML solution Take 20 g by mouth 3 (three) times daily.    Yes Historical Provider, MD  Magnesium 250 MG TABS Take 250 mg by mouth daily.   Yes  Historical Provider, MD  pantoprazole (PROTONIX) 40 MG tablet Take 1 tablet (40 mg total) by mouth daily. 11/09/13  Yes Barton Dubois, MD  potassium chloride SA (K-DUR,KLOR-CON) 20 MEQ tablet Take 40 mEq by mouth daily.   Yes Historical Provider, MD  sorbitol 70 % SOLN Take 20 mLs by mouth daily as needed for moderate constipation (goal is 1-2 good BM's daily). 11/09/13  Yes Barton Dubois, MD  spironolactone (ALDACTONE) 50 MG tablet Take 1 tablet (50 mg total) by mouth daily. Patient taking differently: Take 100 mg by mouth daily. Take 2 --50 mg tabs to equal 100 mg QD total 11/09/13  Yes Barton Dubois, MD  temazepam (RESTORIL) 7.5 MG capsule Take 1 capsule (7.5 mg total) by mouth at bedtime as needed for sleep. 11/09/13  Yes Mahima Bubba Camp, MD  traMADol (ULTRAM) 50 MG tablet Take 1 tablet (50 mg total) by mouth every 8 (eight) hours as needed for moderate pain or severe pain (pain). 11/09/13  Yes Mahima Bubba Camp, MD  ursodiol (ACTIGALL) 500 MG tablet Take 250 mg by mouth 2 (two) times daily.   Yes Historical Provider, MD  Vitamin D, Ergocalciferol, (DRISDOL) 50000 UNITS CAPS capsule Take 50,000 Units by mouth every Wednesday.    Yes Historical Provider, MD  feeding supplement, RESOURCE BREEZE, (RESOURCE BREEZE) LIQD Take 1 Container by mouth 3 (three) times daily between meals. Patient not taking: Reported on 12/05/2013 11/09/13   Barton Dubois, MD  potassium chloride 20 MEQ/15ML (10%) solution Take 40 mEq by mouth daily.    Historical Provider, MD   Physical Exam: Filed Vitals:   12/05/13 1230 12/05/13 1300 12/05/13 1400 12/05/13 1500  BP: 92/59 107/76 86/54 94/55   Pulse: 124 113 124 122  Temp:      TempSrc:      Resp: 13 20 21 16   SpO2: 95% 94% 95% 92%     General:  No apparent distress, pleasant and conversant   Eyes: no scleral icterus  ENT: moist oropharynx  Neck: no JVD  Cardiovascular: regular rate without MRG; 2+ peripheral pulses; 2+ pitting edema   Respiratory: good air  movement, very faint wheezing bilaterally   Abdomen: with mild distention, soft, ascites present   Skin: no rashes  Psychiatric: normal mood and affect  Neurologic: Nonfocal   Labs on Admission:  Basic Metabolic Panel:  Recent Labs Lab 12/05/13 1233  NA 133*  K 4.4  CL 95*  CO2 26  GLUCOSE 146*  BUN 14  CREATININE 1.16  CALCIUM 8.1*   Liver Function Tests:  Recent Labs Lab 12/05/13 1233  AST 40*  ALT 34  ALKPHOS 166*  BILITOT 2.3*  PROT 5.5*  ALBUMIN 1.8*   CBC:  Recent Labs Lab 12/05/13 1233  WBC 6.3  NEUTROABS 4.4  HGB 8.1*  HCT 23.9*  MCV 94.8  PLT PLATELET CLUMPS NOTED ON SMEAR, COUNT APPEARS DECREASED   Cardiac Enzymes:  Recent Labs Lab 12/05/13 1233  TROPONINI <0.30    BNP (last 3 results)  Recent Labs  11/15/13  Melvin 864.3*   CBG: No results for input(s): GLUCAP in the last 168 hours.  Radiological Exams on Admission: Dg Hip Complete Left  12/05/2013   CLINICAL DATA:  Fall 2 days ago on left side. Left posterior left upper leg pain radiating to groin area. Bruising. Initial encounter.  EXAM: LEFT HIP - COMPLETE 2+ VIEW  COMPARISON:  None.  FINDINGS: There is a left femoral neck fracture. Mild valgus angulation. No subluxation or dislocation. Mild degenerative changes in the hips bilaterally.  IMPRESSION: Left femoral neck fracture with mild valgus angulation.   Electronically Signed   By: Rolm Baptise M.D.   On: 12/05/2013 14:11   Dg Femur Left  12/05/2013   CLINICAL DATA:  Fall onto left side.  Left hip and groin pain.  EXAM: LEFT FEMUR - 2 VIEW  COMPARISON:  Left hip series performed today.  FINDINGS: There is a left femoral neck fracture. Mild valgus angulation. No subluxation or dislocation. No additional femoral abnormality.  IMPRESSION: Left femoral neck fracture with mild valgus angulation.   Electronically Signed   By: Rolm Baptise M.D.   On: 12/05/2013 14:11   EKG: Independently reviewed. Sinus tachycardia    Assessment/Plan Principal Problem:   Hip fracture Active Problems:   H/O: GI bleed   Liver cirrhosis secondary to NASH   Ascites   Generalized weakness   Hyponatremia   Anemia   Thrombocytopenia   Coagulopathy   Tachycardia   SIRS (systemic inflammatory response syndrome)   Lactic acidosis  Hip fracture - status post mechanical fall. Orthopedic surgery (Dr. Percell Miller) was consulted by the ED physician and patient will be transferred to [facility] for surgical repair tomorrow.   Liver cirrhosis - he has elements of decompensation with the presence of ascites, elevated bilirubin, coagulopathy. His MELD is 17 and will be at moderate to high risk for surgery. However I am not sure how much we could reverse and from this standpoint he is as optimal as he could be for surgery.  SIRS - patient with tachycardia and hypotension. He is fairly asymptomatic, per family he is at baseline. We'll discontinue Lasix and spironolactone for now, obtain blood cultures. His chest x-ray is without edema, however has left basilar atelectasis, given recent history would favor restarting broad-spectrum antibiotics which can be later discontinued if blood cultures remain negative. Small 250 cc bolus.  Addendum: Blood cultures appear in Epic as drawn from central line only, however after talking to the RN one was from line one was from periphery but they appear both labeled as from the central line. I have asked RN to clarify and correct.  Ascites - not tense, has just recently had the paracentesis on 11/25/2013. He may need another paracentesis prior to discharge this time around.   Coagulopathy - Due to liver disease, will administer vitamin K 5 mg in preparation for surgery.   Anemia - His hemoglobin is 8.1 currently, stable for him, no evidence of an active bleed. Continue iron supplements.  Thrombocytopenia - Due to his liver disease, platelet count now clumped, we'll repeat in the morning   Hyponatremia  - Mild, continue to monitor   Lactic acidosis - Borderline, his liver disease and unable to clear the lactic acid.  History of hepatic encephalopathy - continue lactulose. Check ammonia level prior to surgery.     Diet: low sodium Fluids: none DVT Prophylaxis: SCD  Code Status: Full Family Communication: Discussed with his wife and daughter at the bedside  Disposition Plan: Admit to step down at [facility]   Time spent: 90 minutes  Costin M. Cruzita Lederer, MD [facility] Pager [phone]  If 7PM-7AM, please contact night-coverage [email or web address] Password [facility] 12/05/2013, 4:28 PM

## 2013-12-05 NOTE — Consult Note (Signed)
ORTHOPAEDIC CONSULTATION  REQUESTING PHYSICIAN: Costin Karlyne Greenspan, MD  Chief Complaint: left femoral neck fracture  HPI: Clifford Cook is a 68 y.o. male who complains of a mechanical fall and left hip pain. 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 Stomach ulcer   . GI bleed   . Liver disorder   . Cirrhosis   . GERD (gastroesophageal reflux disease)    Past Surgical History  Procedure Laterality Date  . Cataract extraction, bilateral    . Surgery for lazy eye      age 51  . Esophagogastroduodenoscopy N/A 04/16/2012    Procedure: ESOPHAGOGASTRODUODENOSCOPY (EGD);  Surgeon: Jeryl Columbia, MD;  Location: [facility];  Service: Endoscopy;  Laterality: N/A;  . Esophagogastroduodenoscopy N/A 04/17/2012    Procedure: ESOPHAGOGASTRODUODENOSCOPY (EGD);  Surgeon: Jeryl Columbia, MD;  Location: [facility];  Service: Endoscopy;  Laterality: N/A;  . Direct laryngoscopy N/A 07/13/2013    Procedure: DIRECT LARYNGOSCOPY WITH EXCISION TUMOR;  Surgeon: Rozetta Nunnery, MD;  Location: [facility];  Service: ENT;  Laterality: N/A;   History   Social History  . Marital Status: Married    Spouse Name: N/A    Number of Children: N/A  . Years of Education: N/A   Social History Main Topics  . Smoking status: Current Every Day Smoker -- 1.00 packs/day    Types: Cigarettes  . Smokeless tobacco: Never Used  . Alcohol Use: No     Comment: over i yr  . Drug Use: No  . Sexual Activity: No   Other Topics Concern  . None   Social History Narrative   Patient is married. He is independent.   No family history on file. Allergies  Allergen Reactions  . Pulmicort [Budesonide] Shortness Of Breath    Causes stridor  . Penicillins Other (See Comments)    Rash hives, white spots.   Prior to Admission medications   Medication Sig Start Date End Date Taking? Authorizing Provider  alendronate (FOSAMAX) 70 MG tablet Take 1 tablet  (70 mg total) by mouth once a week. Hold until antibiotic therapy is completed 11/09/13  Yes Barton Dubois, MD  diphenhydrAMINE (BENADRYL) 25 mg capsule Take 1 capsule (25 mg total) by mouth every 12 (twelve) hours as needed for itching. 11/09/13  Yes Barton Dubois, MD  ferrous sulfate 325 (65 FE) MG tablet Take 1 tablet (325 mg total) by mouth 2 (two) times daily with a meal. 11/09/13  Yes Barton Dubois, MD  furosemide (LASIX) 40 MG tablet Take 1 tablet (40 mg total) by mouth daily. 11/09/13  Yes Barton Dubois, MD  HYDROmorphone (DILAUDID) 2 MG tablet Take 1-2 mg by mouth every 12 (twelve) hours as needed for severe pain.   Yes Historical Provider, MD  lactulose (CHRONULAC) 10 GM/15ML solution Take 20 g by mouth 3 (three) times daily.    Yes Historical Provider, MD  Magnesium 250 MG TABS Take 250 mg by mouth daily.   Yes Historical Provider, MD  pantoprazole (PROTONIX) 40 MG tablet Take 1 tablet (40 mg total) by mouth daily. 11/09/13  Yes Barton Dubois, MD  potassium chloride SA (K-DUR,KLOR-CON) 20 MEQ tablet Take 40 mEq by mouth daily.   Yes Historical Provider, MD  sorbitol 70 % SOLN Take 20 mLs by mouth daily as needed for moderate constipation (goal is 1-2 good BM's daily). 11/09/13  Yes Barton Dubois, MD  spironolactone (ALDACTONE) 50 MG  tablet Take 1 tablet (50 mg total) by mouth daily. Patient taking differently: Take 100 mg by mouth daily. Take 2 --50 mg tabs to equal 100 mg QD total 11/09/13  Yes Barton Dubois, MD  temazepam (RESTORIL) 7.5 MG capsule Take 1 capsule (7.5 mg total) by mouth at bedtime as needed for sleep. 11/09/13  Yes Mahima Bubba Camp, MD  traMADol (ULTRAM) 50 MG tablet Take 1 tablet (50 mg total) by mouth every 8 (eight) hours as needed for moderate pain or severe pain (pain). 11/09/13  Yes Mahima Bubba Camp, MD  ursodiol (ACTIGALL) 500 MG tablet Take 250 mg by mouth 2 (two) times daily.   Yes Historical Provider, MD  Vitamin D, Ergocalciferol, (DRISDOL) 50000 UNITS CAPS capsule  Take 50,000 Units by mouth every Wednesday.    Yes Historical Provider, MD  feeding supplement, RESOURCE BREEZE, (RESOURCE BREEZE) LIQD Take 1 Container by mouth 3 (three) times daily between meals. Patient not taking: Reported on 12/05/2013 11/09/13   Barton Dubois, MD  potassium chloride 20 MEQ/15ML (10%) solution Take 40 mEq by mouth daily.    Historical Provider, MD   Dg Hip Complete Left  12/05/2013   CLINICAL DATA:  Fall 2 days ago on left side. Left posterior left upper leg pain radiating to groin area. Bruising. Initial encounter.  EXAM: LEFT HIP - COMPLETE 2+ VIEW  COMPARISON:  None.  FINDINGS: There is a left femoral neck fracture. Mild valgus angulation. No subluxation or dislocation. Mild degenerative changes in the hips bilaterally.  IMPRESSION: Left femoral neck fracture with mild valgus angulation.   Electronically Signed   By: Rolm Baptise M.D.   On: 12/05/2013 14:11   Dg Femur Left  12/05/2013   CLINICAL DATA:  Fall onto left side.  Left hip and groin pain.  EXAM: LEFT FEMUR - 2 VIEW  COMPARISON:  Left hip series performed today.  FINDINGS: There is a left femoral neck fracture. Mild valgus angulation. No subluxation or dislocation. No additional femoral abnormality.  IMPRESSION: Left femoral neck fracture with mild valgus angulation.   Electronically Signed   By: Rolm Baptise M.D.   On: 12/05/2013 14:11   Dg Chest Port 1 View  12/05/2013   CLINICAL DATA:  Tachycardia, hip fracture  EXAM: PORTABLE CHEST - 1 VIEW  COMPARISON:  None.  FINDINGS: Normal cardiac silhouette. There is a left PICC line with tip in distal SVC. Chronic bronchitic markings are noted. No pulmonary edema or pneumothorax. Mild left basilar atelectasis.  IMPRESSION: No interval change. Chronic bronchitic markings and left basilar atelectasis.   Electronically Signed   By: Suzy Bouchard M.D.   On: 12/05/2013 16:39    Positive ROS: All other systems have been reviewed and were otherwise negative with the  exception of those mentioned in the HPI and as above.  Labs cbc  Recent Labs  12/05/13 1233  WBC 6.3  HGB 8.1*  HCT 23.9*  PLT PLATELET CLUMPS NOTED ON SMEAR, COUNT APPEARS DECREASED    Labs inflam No results for input(s): CRP in the last 72 hours.  Invalid input(s): ESR  Labs coag  Recent Labs  12/05/13 1233  INR 1.50*     Recent Labs  12/05/13 1233  NA 133*  K 4.4  CL 95*  CO2 26  GLUCOSE 146*  BUN 14  CREATININE 1.16  CALCIUM 8.1*    Physical Exam: Filed Vitals:   12/05/13 1500  BP: 94/55  Pulse: 122  Temp:   Resp: 16   General: Alert,  no acute distress Cardiovascular: No pedal edema Respiratory: No cyanosis, no use of accessory musculature GI: No organomegaly, abdomen is soft and non-tender Skin: No lesions in the area of chief complaint other than those listed below in MSK exam.  Neurologic: Sensation intact distally Psychiatric: Patient is competent for consent with normal mood and affect Lymphatic: No axillary or cervical lymphadenopathy  MUSCULOSKELETAL:  LLE: SILT DP/SP/S/S/T nerve, 2+ DP, +TA/GS/EHL Compartments soft Skin benign Other extremities are atraumatic with painless ROM and NVI.  Assessment: Left femoral neck fracture  Plan: Pinning of left hip fracture on 11/23 Weight Bearing Status: WBAT post op PT VTE px: SCD's and will likely hold chemical px due to his auto-anticoagulation   Edmonia Lynch, D, MD Cell [phone]   12/05/2013 5:03 PM

## 2013-12-05 NOTE — ED Notes (Signed)
Bed: WA02 Expected date:  Expected time:  Means of arrival:  Comments: Ems fall

## 2013-12-05 NOTE — Progress Notes (Signed)
ANTIBIOTIC CONSULT NOTE - INITIAL  Pharmacy Consult for Vancomycin / Primaxin Indication: Sepsis  Allergies  Allergen Reactions  . Pulmicort [Budesonide] Shortness Of Breath    Causes stridor  . Penicillins Other (See Comments)    Rash hives, white spots.    Patient Measurements:   Adjusted Body Weight:   Vital Signs: Temp: 98.2 F (36.8 C) (11/22 1123) Temp Source: Oral (11/22 1123) BP: 80/58 mmHg (11/22 1630) Pulse Rate: 121 (11/22 1630) Intake/Output from previous day:   Intake/Output from this shift:    Labs:  Recent Labs  12/05/13 1233  WBC 6.3  HGB 8.1*  PLT PLATELET CLUMPS NOTED ON SMEAR, COUNT APPEARS DECREASED  CREATININE 1.16   Estimated Creatinine Clearance: 71.5 mL/min (by C-G formula based on Cr of 1.16). No results for input(s): VANCOTROUGH, VANCOPEAK, VANCORANDOM, GENTTROUGH, GENTPEAK, GENTRANDOM, TOBRATROUGH, TOBRAPEAK, TOBRARND, AMIKACINPEAK, AMIKACINTROU, AMIKACIN in the last 72 hours.   Microbiology: Recent Results (from the past 720 hour(s))  Body fluid culture     Status: None   Collection Time: 11/15/13  5:06 PM  Result Value Ref Range Status   Specimen Description ABDOMEN ASCITES  Final   Special Requests Normal  Final   Gram Stain   Final    NO WBC SEEN NO ORGANISMS SEEN Performed at [facility]    Culture   Final    NO GROWTH 3 DAYS Performed at [facility]    Report Status 11/19/2013 FINAL  Final  Blood Culture (routine x 2)     Status: None   Collection Time: 11/25/13  2:29 AM  Result Value Ref Range Status   Specimen Description BLOOD RIGHT ANTECUBITAL  Final   Special Requests BOTTLES DRAWN AEROBIC AND ANAEROBIC 8CC  Final   Culture  Setup Time   Final    11/25/2013 09:03 Performed at [facility]    Culture   Final    NO GROWTH 5 DAYS Performed at [facility]    Report Status 12/01/2013 FINAL  Final  Blood Culture (routine x 2)     Status: None   Collection Time: 11/25/13  2:51 AM   Result Value Ref Range Status   Specimen Description BLOOD PORTA CATH PICC  Final   Special Requests BOTTLES DRAWN AEROBIC AND ANAEROBIC 3CC  Final   Culture  Setup Time   Final    11/25/2013 09:02 Performed at [facility]    Culture   Final    NO GROWTH 5 DAYS Performed at [facility]    Report Status 12/01/2013 FINAL  Final  Urine culture     Status: None   Collection Time: 11/25/13  3:46 AM  Result Value Ref Range Status   Specimen Description URINE, CATHETERIZED  Final   Special Requests NONE  Final   Culture  Setup Time   Final    11/25/2013 09:10 Performed at [facility] Performed at [facility]   Final   Culture NO GROWTH Performed at [facility]   Final   Report Status 11/26/2013 FINAL  Final    Medical History: Past Medical History  Diagnosis Date  . Stomach ulcer   . GI bleed   . Liver disorder   . Cirrhosis   . GERD (gastroesophageal reflux disease)    Assessment: 59 yoM with PMHx liver cirrhosis, recurrent ascites, coagulopathy, and recent hospitalization with sepsis 2/2 PNA with strep viridans bacteremia s/p 4 weeks of  Colbert Ewing presents to [facility] with left hip pain after a fall.  Noted to be tachycardic and hypotensive with borderline lactic acid elevation.  Pharmacy consulted to dose vancomycin and primaxin for SIRS.  Noted allergy to penicillins - pt has tolerated carbapenems in the past.   11/22 >> Vancomycin  >> 11/22 >> Primaxin >>    Tmax: Afebrile WBCs: WNL Renal: SCr 1.16, CrCl 72 (N61)  11/22 blood: IP  Goal of Therapy:  Vancomycin trough level 15-20 mcg/ml Eradication of infection  Plan:  Vancomycin 2g IV x 1, then 1g IV q12h  Primaxin 500mg  IV q8h F/u renal fxn, cultures, clinical course, vancomycin trough as indicated  Ralene Bathe, PharmD, BCPS 12/05/2013, 6:05 PM  Pager: [phone]

## 2013-12-05 NOTE — ED Notes (Signed)
Awake. Verbally responsive. Resp even and unlabored. ABC's intact. IV infusing ABT without difficulty. Family at bedside. NAD noted.

## 2013-12-05 NOTE — ED Notes (Signed)
Patient fell two days ago at home on his left side.  Reports the worse pain is in the back of his left upper leg.  Bruising present.  Pain worse with movement.  Not evaluated at the time of fall.  Had just been released from hospital for pneumonia, picc line left arm.

## 2013-12-05 NOTE — ED Notes (Signed)
Resting quietly with eye closed. Easily arousable. Verbally responsive. Resp even and unlabored. ABC's intact. No adverse reaction to ABT (Primaxin). Family at bedside. NAD noted.

## 2013-12-05 NOTE — ED Provider Notes (Signed)
CSN: [id number]     Arrival date & time 12/05/13  1112 History   First MD Initiated Contact with Patient 12/05/13 1147     Chief Complaint  Patient presents with  . Fall   Clifford Cook is a 68 y.o. male with a hx of liver cirrhosis and recent pneumonia infection presents the ED complaining of a recent slip and fall 2 days ago. He reports he was getting up to get something to eat when he slipped on a door frame and fell to the ground striking his left side. He is complaining of 2 out of 10 left upper leg pain. He reports his only been able to take a few steps since the fall. He denies loss of range of motion. Denies numbness or tingling. He denies head injury.  Patient states that he feels weak, but that he feels better than his last visit her on 11/29/13. He reports his abdominal swelling has decreased. He reports his last paracentesis was 11/30/2013.  Patient denies feeling short of breath currently. He reports he became short of breath when moving to the bed today, but this is usual for him. Patient is not on home oxygen. He reports having good appetite recently. He denies fevers, chills, cough, shortness of breath, wheezing, dysuria, hematuria, diarrhea, abdominal pain, numbness, tingling, loss of consciousness, dizziness or lightheadedness. Family states that his blood pressure is typically low with an elevated heart rate. Patient does not wish for pain medicine at this time.   (Consider location/radiation/quality/duration/timing/severity/associated sxs/prior Treatment) HPI  Past Medical History  Diagnosis Date  . Stomach ulcer   . GI bleed   . Liver disorder   . Cirrhosis   . GERD (gastroesophageal reflux disease)    Past Surgical History  Procedure Laterality Date  . Cataract extraction, bilateral    . Surgery for lazy eye      age 75  . Esophagogastroduodenoscopy N/A 04/16/2012    Procedure: ESOPHAGOGASTRODUODENOSCOPY (EGD);  Surgeon: Jeryl Columbia, MD;  Location: [facility];  Service:  Endoscopy;  Laterality: N/A;  . Esophagogastroduodenoscopy N/A 04/17/2012    Procedure: ESOPHAGOGASTRODUODENOSCOPY (EGD);  Surgeon: Jeryl Columbia, MD;  Location: [facility];  Service: Endoscopy;  Laterality: N/A;  . Direct laryngoscopy N/A 07/13/2013    Procedure: DIRECT LARYNGOSCOPY WITH EXCISION TUMOR;  Surgeon: Rozetta Nunnery, MD;  Location: [facility];  Service: ENT;  Laterality: N/A;   No family history on file. History  Substance Use Topics  . Smoking status: Current Every Day Smoker -- 1.00 packs/day    Types: Cigarettes  . Smokeless tobacco: Never Used  . Alcohol Use: No     Comment: over i yr    Review of Systems  Constitutional: Positive for chills and fatigue. Negative for fever.  HENT: Negative for congestion, ear pain, sore throat and trouble swallowing.   Eyes: Negative for pain and visual disturbance.  Respiratory: Positive for shortness of breath. Negative for cough and wheezing.   Cardiovascular: Negative for chest pain, palpitations and leg swelling.  Gastrointestinal: Negative for nausea, vomiting, abdominal pain and diarrhea.  Genitourinary: Negative for dysuria, decreased urine volume and difficulty urinating.  Musculoskeletal: Negative for back pain and neck pain.       Left leg pain  Skin: Negative for rash and wound.  Neurological: Positive for weakness. Negative for dizziness, syncope, light-headedness, numbness and headaches.  All other systems reviewed and are negative.     Allergies  Pulmicort and Penicillins  Home Medications  Prior to Admission medications   Medication Sig Start Date End Date Taking? Authorizing Provider  alendronate (FOSAMAX) 70 MG tablet Take 1 tablet (70 mg total) by mouth once a week. Hold until antibiotic therapy is completed 11/09/13  Yes Barton Dubois, MD  diphenhydrAMINE (BENADRYL) 25 mg capsule Take 1 capsule (25 mg total) by mouth every 12 (twelve) hours as needed for itching. 11/09/13  Yes Barton Dubois, MD  ferrous sulfate 325 (65 FE) MG tablet Take 1 tablet (325 mg total) by mouth 2 (two) times daily with a meal. 11/09/13  Yes Barton Dubois, MD  furosemide (LASIX) 40 MG tablet Take 1 tablet (40 mg total) by mouth daily. 11/09/13  Yes Barton Dubois, MD  HYDROmorphone (DILAUDID) 2 MG tablet Take 1-2 mg by mouth every 12 (twelve) hours as needed for severe pain.   Yes Historical Provider, MD  lactulose (CHRONULAC) 10 GM/15ML solution Take 20 g by mouth 3 (three) times daily.    Yes Historical Provider, MD  Magnesium 250 MG TABS Take 250 mg by mouth daily.   Yes Historical Provider, MD  pantoprazole (PROTONIX) 40 MG tablet Take 1 tablet (40 mg total) by mouth daily. 11/09/13  Yes Barton Dubois, MD  potassium chloride SA (K-DUR,KLOR-CON) 20 MEQ tablet Take 40 mEq by mouth daily.   Yes Historical Provider, MD  sorbitol 70 % SOLN Take 20 mLs by mouth daily as needed for moderate constipation (goal is 1-2 good BM's daily). 11/09/13  Yes Barton Dubois, MD  spironolactone (ALDACTONE) 50 MG tablet Take 1 tablet (50 mg total) by mouth daily. Patient taking differently: Take 100 mg by mouth daily. Take 2 --50 mg tabs to equal 100 mg QD total 11/09/13  Yes Barton Dubois, MD  temazepam (RESTORIL) 7.5 MG capsule Take 1 capsule (7.5 mg total) by mouth at bedtime as needed for sleep. 11/09/13  Yes Mahima Bubba Camp, MD  traMADol (ULTRAM) 50 MG tablet Take 1 tablet (50 mg total) by mouth every 8 (eight) hours as needed for moderate pain or severe pain (pain). 11/09/13  Yes Mahima Bubba Camp, MD  ursodiol (ACTIGALL) 500 MG tablet Take 250 mg by mouth 2 (two) times daily.   Yes Historical Provider, MD  Vitamin D, Ergocalciferol, (DRISDOL) 50000 UNITS CAPS capsule Take 50,000 Units by mouth every Wednesday.    Yes Historical Provider, MD  feeding supplement, RESOURCE BREEZE, (RESOURCE BREEZE) LIQD Take 1 Container by mouth 3 (three) times daily between meals. Patient not taking: Reported on 12/05/2013 11/09/13   Barton Dubois, MD  potassium chloride 20 MEQ/15ML (10%) solution Take 40 mEq by mouth daily.    Historical Provider, MD   BP 94/55 mmHg  Pulse 122  Temp(Src) 98.2 F (36.8 C) (Oral)  Resp 16  SpO2 92% Physical Exam  Constitutional: He is oriented to person, place, and time. He appears well-developed and well-nourished. No distress.  Patient is on 3 L oxygen via nasal cannula.   HENT:  Head: Normocephalic and atraumatic.  Mouth/Throat: Oropharynx is clear and moist. No oropharyngeal exudate.  Eyes: Conjunctivae are normal. Pupils are equal, round, and reactive to light. Right eye exhibits no discharge. Left eye exhibits no discharge.  Neck: Neck supple.  Cardiovascular: Regular rhythm, normal heart sounds and intact distal pulses.  Exam reveals no gallop and no friction rub.   No murmur heard. Tachycardic at 124. Bilateral posterior tibialis pulses intact.   Pulmonary/Chest: Effort normal and breath sounds normal. No respiratory distress. He has no wheezes. He has no rales.  Abdominal: Soft. He exhibits distension. There is no tenderness.  Moderate abdominal distention  Musculoskeletal: He exhibits tenderness. He exhibits no edema.  Tenderness to palpation over his left proximal thigh. No deformity, edema, or ecchymosis noted. Patient has no tenderness in his hips. No pelvic instability noted. Patient is able to move his bilateral lower legs without difficulty.  1 + pitting edema in his bilateral lower extremities.   Lymphadenopathy:    He has no cervical adenopathy.  Neurological: He is alert and oriented to person, place, and time. No cranial nerve deficit. Coordination normal.  Skin: Skin is warm and dry. No rash noted. He is not diaphoretic. No erythema. No pallor.  Psychiatric: He has a normal mood and affect. His behavior is normal.  Nursing note and vitals reviewed.   ED Course  Procedures (including critical care time) Labs Review Labs Reviewed  COMPREHENSIVE METABOLIC PANEL -  Abnormal; Notable for the following:    Sodium 133 (*)    Chloride 95 (*)    Glucose, Bld 146 (*)    Calcium 8.1 (*)    Total Protein 5.5 (*)    Albumin 1.8 (*)    AST 40 (*)    Alkaline Phosphatase 166 (*)    Total Bilirubin 2.3 (*)    GFR calc non Af Amer 62 (*)    GFR calc Af Amer 72 (*)    All other components within normal limits  CBC WITH DIFFERENTIAL - Abnormal; Notable for the following:    RBC 2.52 (*)    Hemoglobin 8.1 (*)    HCT 23.9 (*)    RDW 18.8 (*)    Monocytes Relative 13 (*)    All other components within normal limits  LACTIC ACID, PLASMA - Abnormal; Notable for the following:    Lactic Acid, Venous 2.5 (*)    All other components within normal limits  PROTIME-INR - Abnormal; Notable for the following:    Prothrombin Time 18.2 (*)    INR 1.50 (*)    All other components within normal limits  APTT - Abnormal; Notable for the following:    aPTT 43 (*)    All other components within normal limits  URINALYSIS, ROUTINE W REFLEX MICROSCOPIC  TROPONIN I  TYPE AND SCREEN    Imaging Review Dg Hip Complete Left  12/05/2013   CLINICAL DATA:  Fall 2 days ago on left side. Left posterior left upper leg pain radiating to groin area. Bruising. Initial encounter.  EXAM: LEFT HIP - COMPLETE 2+ VIEW  COMPARISON:  None.  FINDINGS: There is a left femoral neck fracture. Mild valgus angulation. No subluxation or dislocation. Mild degenerative changes in the hips bilaterally.  IMPRESSION: Left femoral neck fracture with mild valgus angulation.   Electronically Signed   By: Rolm Baptise M.D.   On: 12/05/2013 14:11   Dg Femur Left  12/05/2013   CLINICAL DATA:  Fall onto left side.  Left hip and groin pain.  EXAM: LEFT FEMUR - 2 VIEW  COMPARISON:  Left hip series performed today.  FINDINGS: There is a left femoral neck fracture. Mild valgus angulation. No subluxation or dislocation. No additional femoral abnormality.  IMPRESSION: Left femoral neck fracture with mild valgus  angulation.   Electronically Signed   By: Rolm Baptise M.D.   On: 12/05/2013 14:11     EKG Interpretation   Date/Time:  Sunday December 05 2013 11:22:42 EST Ventricular Rate:  125 PR Interval:  119 QRS Duration: 97 QT Interval:  349 QTC Calculation: 503 R Axis:   -77 Text Interpretation:  Sinus tachycardia Abnormal R-wave progression, early  transition Inferior infarct, age indeterminate Prolonged QT interval Sinus  tachycardia QT prolonged No significant change since 12/2012 Abnormal ekg  Confirmed by Carmin Muskrat  MD [phone]) on 12/05/2013 12:46:05 PM      Filed Vitals:   12/05/13 1230 12/05/13 1300 12/05/13 1400 12/05/13 1500  BP: 92/59 107/76 86/54 94/55   Pulse: 124 113 124 122  Temp:      TempSrc:      Resp: 13 20 21 16   SpO2: 95% 94% 95% 92%     MDM   Meds given in ED:  Medications - No data to display  New Prescriptions   No medications on file    Final diagnoses:  Femoral neck fracture, left, closed, initial encounter   Clifford Cook is a 68 y.o. male with a hx of liver cirrhosis and recent pneumonia infection presents the ED complaining of a recent slip and fall 2 days ago. He denies LOC or head injury. Patient slipped on door frame. Patient has pain to his left proximal thigh. There is no deformity noted.  Patient has required 3 L O2 via nasal cannula during this ED stay. Patient denies SOB except on exertion. He is not on home oxygen at this time.  The patient's UA was unremarkable. His lactic acid was 2.5. His hemoglobin was 8.1, but this around his baseline.  Left femur x-ray indicated a left femoral neck fracture. Consult was done by Dr. Kathrynn Humble for admission to internal medicine for orthopedic consult. Patient and family are in agreement for admission.  The patient will be transferred to [facility] for surgical intervention.   The patient was discussed with and evaluated by Dr. Kathrynn Humble who agrees with assessment and plan.       Hanley Hays,  PA-C 12/05/13 [place], MD 12/05/13 [phone]

## 2013-12-05 NOTE — ED Notes (Signed)
Family and patient report that he normally has a low blood pressure and high heart rate.  His doctor reported that his pneumonia had resolved.  Patient rates left leg pain as a 4/10.

## 2013-12-05 NOTE — ED Notes (Signed)
Called and gave report to [place], Therapist, sports at [facility]

## 2013-12-05 NOTE — ED Notes (Signed)
Unable to void in urinal at current time

## 2013-12-06 ENCOUNTER — Encounter (HOSPITAL_COMMUNITY): Payer: Self-pay | Admitting: *Deleted

## 2013-12-06 ENCOUNTER — Inpatient Hospital Stay (HOSPITAL_COMMUNITY): Payer: Medicare Other

## 2013-12-06 ENCOUNTER — Encounter (HOSPITAL_COMMUNITY)
Admission: EM | Disposition: A | Discharge status: Home Health | Payer: Self-pay | Source: Home / Self Care | Attending: Internal Medicine

## 2013-12-06 ENCOUNTER — Inpatient Hospital Stay (HOSPITAL_COMMUNITY): Payer: Medicare Other | Admitting: Certified Registered Nurse Anesthetist

## 2013-12-06 DIAGNOSIS — S72009A Fracture of unspecified part of neck of unspecified femur, initial encounter for closed fracture: Secondary | ICD-10-CM

## 2013-12-06 DIAGNOSIS — D689 Coagulation defect, unspecified: Secondary | ICD-10-CM

## 2013-12-06 HISTORY — PX: HIP PINNING,CANNULATED: SHX1758

## 2013-12-06 LAB — CBC
HCT: 24.3 % — ABNORMAL LOW (ref 39.0–52.0)
HEMATOCRIT: 23.3 % — AB (ref 39.0–52.0)
Hemoglobin: 7.8 g/dL — ABNORMAL LOW (ref 13.0–17.0)
Hemoglobin: 8.1 g/dL — ABNORMAL LOW (ref 13.0–17.0)
MCH: 30.8 pg (ref 26.0–34.0)
MCH: 30.9 pg (ref 26.0–34.0)
MCHC: 33.5 g/dL (ref 30.0–36.0)
MCHC: 33.3 g/dL (ref 30.0–36.0)
MCV: 92.1 fL (ref 78.0–100.0)
MCV: 92.7 fL (ref 78.0–100.0)
PLATELETS: 72 10*3/uL — AB (ref 150–400)
Platelets: 80 10*3/uL — ABNORMAL LOW (ref 150–400)
RBC: 2.53 MIL/uL — ABNORMAL LOW (ref 4.22–5.81)
RBC: 2.62 MIL/uL — AB (ref 4.22–5.81)
RDW: 18.9 % — AB (ref 11.5–15.5)
RDW: 18.9 % — AB (ref 11.5–15.5)
WBC: 6.1 10*3/uL (ref 4.0–10.5)
WBC: 6.7 10*3/uL (ref 4.0–10.5)

## 2013-12-06 LAB — COMPREHENSIVE METABOLIC PANEL
ALK PHOS: 156 U/L — AB (ref 39–117)
ALT: 31 U/L (ref 0–53)
AST: 37 U/L (ref 0–37)
Albumin: 1.7 g/dL — ABNORMAL LOW (ref 3.5–5.2)
Anion gap: 10 (ref 5–15)
BILIRUBIN TOTAL: 2.2 mg/dL — AB (ref 0.3–1.2)
BUN: 14 mg/dL (ref 6–23)
CHLORIDE: 98 meq/L (ref 96–112)
CO2: 26 meq/L (ref 19–32)
Calcium: 7.7 mg/dL — ABNORMAL LOW (ref 8.4–10.5)
Creatinine, Ser: 1.1 mg/dL (ref 0.50–1.35)
GFR calc non Af Amer: 67 mL/min — ABNORMAL LOW (ref >90)
GFR, EST AFRICAN AMERICAN: 77 mL/min — AB (ref >90)
GLUCOSE: 75 mg/dL (ref 70–99)
POTASSIUM: 4.3 meq/L (ref 3.7–5.3)
Sodium: 134 mEq/L — ABNORMAL LOW (ref 137–147)
TOTAL PROTEIN: 5.2 g/dL — AB (ref 6.0–8.3)

## 2013-12-06 LAB — PROTIME-INR
INR: 1.58 — ABNORMAL HIGH (ref 0.00–1.49)
Prothrombin Time: 19 seconds — ABNORMAL HIGH (ref 11.6–15.2)

## 2013-12-06 LAB — MRSA PCR SCREENING: MRSA by PCR: NEGATIVE

## 2013-12-06 LAB — ABO/RH: ABO/RH(D): O NEG

## 2013-12-06 LAB — PREPARE RBC (CROSSMATCH)

## 2013-12-06 SURGERY — FIXATION, FEMUR, NECK, PERCUTANEOUS, USING SCREW
Anesthesia: General | Site: Hip | Laterality: Left

## 2013-12-06 MED ORDER — FENTANYL CITRATE 0.05 MG/ML IJ SOLN
INTRAMUSCULAR | Status: DC | PRN
  Administered 2013-12-06: 25 ug via INTRAVENOUS
  Administered 2013-12-06: 75 ug via INTRAVENOUS

## 2013-12-06 MED ORDER — PROPOFOL 10 MG/ML IV BOLUS
INTRAVENOUS | Status: AC
  Filled 2013-12-06: qty 20

## 2013-12-06 MED ORDER — CEFAZOLIN SODIUM-DEXTROSE 2-3 GM-% IV SOLR
2.0000 g | Freq: Once | INTRAVENOUS | Status: AC
  Administered 2013-12-06: 2 g via INTRAVENOUS
  Filled 2013-12-06: qty 50

## 2013-12-06 MED ORDER — PROPOFOL 10 MG/ML IV BOLUS
INTRAVENOUS | Status: DC | PRN
  Administered 2013-12-06: 80 mg via INTRAVENOUS

## 2013-12-06 MED ORDER — MIDAZOLAM HCL 2 MG/2ML IJ SOLN
INTRAMUSCULAR | Status: AC
  Filled 2013-12-06: qty 2

## 2013-12-06 MED ORDER — SODIUM CHLORIDE 0.9 % IV SOLN: Freq: Once | INTRAVENOUS | Status: DC

## 2013-12-06 MED ORDER — ACETAMINOPHEN 650 MG RE SUPP: 650.0000 mg | Freq: Four times a day (QID) | RECTAL | Status: DC | PRN

## 2013-12-06 MED ORDER — SODIUM CHLORIDE 0.9 % IV SOLN
500.0000 mg | Freq: Four times a day (QID) | INTRAVENOUS | Status: DC
  Administered 2013-12-06 – 2013-12-08 (×7): 500 mg via INTRAVENOUS
  Filled 2013-12-06 (×9): qty 500

## 2013-12-06 MED ORDER — PHENYLEPHRINE HCL 10 MG/ML IJ SOLN
INTRAMUSCULAR | Status: DC | PRN
  Administered 2013-12-06: 40 ug via INTRAVENOUS
  Administered 2013-12-06: 80 ug via INTRAVENOUS

## 2013-12-06 MED ORDER — BUPIVACAINE-EPINEPHRINE (PF) 0.25% -1:200000 IJ SOLN
INTRAMUSCULAR | Status: AC
  Filled 2013-12-06: qty 30

## 2013-12-06 MED ORDER — FENTANYL CITRATE 0.05 MG/ML IJ SOLN
INTRAMUSCULAR | Status: AC
  Filled 2013-12-06: qty 5

## 2013-12-06 MED ORDER — MENTHOL 3 MG MT LOZG
1.0000 | LOZENGE | OROMUCOSAL | Status: DC | PRN
  Filled 2013-12-06: qty 9

## 2013-12-06 MED ORDER — LIDOCAINE HCL (CARDIAC) 20 MG/ML IV SOLN
INTRAVENOUS | Status: DC | PRN
  Administered 2013-12-06: 80 mg via INTRAVENOUS

## 2013-12-06 MED ORDER — CEFAZOLIN SODIUM-DEXTROSE 2-3 GM-% IV SOLR
2.0000 g | Freq: Four times a day (QID) | INTRAVENOUS | Status: AC
  Administered 2013-12-06 – 2013-12-07 (×2): 2 g via INTRAVENOUS
  Filled 2013-12-06 (×2): qty 50

## 2013-12-06 MED ORDER — ONDANSETRON HCL 4 MG/2ML IJ SOLN
INTRAMUSCULAR | Status: DC | PRN
  Administered 2013-12-06: 4 mg via INTRAVENOUS

## 2013-12-06 MED ORDER — HYDROCODONE-ACETAMINOPHEN 5-325 MG PO TABS
1.0000 | ORAL_TABLET | Freq: Four times a day (QID) | ORAL | Status: DC | PRN
  Administered 2013-12-07: 1 via ORAL
  Administered 2013-12-08 – 2013-12-11 (×5): 2 via ORAL
  Filled 2013-12-06 (×4): qty 2
  Filled 2013-12-06: qty 1
  Filled 2013-12-06: qty 2

## 2013-12-06 MED ORDER — RIFAXIMIN 550 MG PO TABS
550.0000 mg | ORAL_TABLET | Freq: Two times a day (BID) | ORAL | Status: DC
  Administered 2013-12-06 – 2013-12-14 (×16): 550 mg via ORAL
  Filled 2013-12-06 (×18): qty 1

## 2013-12-06 MED ORDER — ROCURONIUM BROMIDE 50 MG/5ML IV SOLN
INTRAVENOUS | Status: AC
  Filled 2013-12-06: qty 1

## 2013-12-06 MED ORDER — PHENYLEPHRINE HCL 10 MG/ML IJ SOLN
10.0000 mg | INTRAVENOUS | Status: DC | PRN
  Administered 2013-12-06: 20 ug/min via INTRAVENOUS

## 2013-12-06 MED ORDER — SUCCINYLCHOLINE CHLORIDE 20 MG/ML IJ SOLN
INTRAMUSCULAR | Status: DC | PRN
  Administered 2013-12-06: 100 mg via INTRAVENOUS

## 2013-12-06 MED ORDER — DOCUSATE SODIUM 100 MG PO CAPS: 100.0000 mg | ORAL_CAPSULE | Freq: Two times a day (BID) | ORAL | Status: AC

## 2013-12-06 MED ORDER — LACTATED RINGERS IV SOLN
INTRAVENOUS | Status: DC | PRN
  Administered 2013-12-06: 16:00:00 via INTRAVENOUS

## 2013-12-06 MED ORDER — LACTULOSE ENEMA
300.0000 mL | Freq: Once | ORAL | Status: AC
  Administered 2013-12-06: 300 mL via RECTAL
  Filled 2013-12-06: qty 300

## 2013-12-06 MED ORDER — HYDROCODONE-ACETAMINOPHEN 5-325 MG PO TABS: 1.0000 | ORAL_TABLET | ORAL | Status: DC | PRN

## 2013-12-06 MED ORDER — ALBUMIN HUMAN 5 % IV SOLN
INTRAVENOUS | Status: DC | PRN
  Administered 2013-12-06: 17:00:00 via INTRAVENOUS

## 2013-12-06 MED ORDER — LACTATED RINGERS IV SOLN
INTRAVENOUS | Status: DC
  Administered 2013-12-06: 16:00:00 via INTRAVENOUS

## 2013-12-06 MED ORDER — PHENOL 1.4 % MT LIQD
1.0000 | OROMUCOSAL | Status: DC | PRN
Start: 2013-12-06 — End: 2013-12-14

## 2013-12-06 MED ORDER — ACETAMINOPHEN 325 MG PO TABS
650.0000 mg | ORAL_TABLET | Freq: Four times a day (QID) | ORAL | Status: DC | PRN
  Filled 2013-12-06: qty 2

## 2013-12-06 MED ORDER — 0.9 % SODIUM CHLORIDE (POUR BTL) OPTIME
TOPICAL | Status: DC | PRN
  Administered 2013-12-06: 1000 mL

## 2013-12-06 MED ORDER — ONDANSETRON HCL 4 MG/2ML IJ SOLN
INTRAMUSCULAR | Status: AC
  Filled 2013-12-06: qty 2

## 2013-12-06 MED ORDER — MORPHINE SULFATE 2 MG/ML IJ SOLN: 0.5000 mg | INTRAMUSCULAR | Status: DC | PRN

## 2013-12-06 SURGICAL SUPPLY — 41 items
BIT DRILL 4.9 CANNULATED (BIT) ×1
BIT DRILL CANN QC 4.9 LRG (BIT) ×1 IMPLANT
CANISTER SUCTION 2500CC (MISCELLANEOUS) ×3 IMPLANT
CLOSURE STERI-STRIP 1/2X4 (GAUZE/BANDAGES/DRESSINGS) ×1
CLSR STERI-STRIP ANTIMIC 1/2X4 (GAUZE/BANDAGES/DRESSINGS) ×2 IMPLANT
COVER PERINEAL POST (MISCELLANEOUS) ×3 IMPLANT
COVER SURGICAL LIGHT HANDLE (MISCELLANEOUS) ×3 IMPLANT
DRAPE IMP U-DRAPE 54X76 (DRAPES) ×3 IMPLANT
DRAPE STERI IOBAN 125X83 (DRAPES) ×3 IMPLANT
DRILL BIT CANNULATED 4.9 (BIT) ×2
DRSG EMULSION OIL 3X3 NADH (GAUZE/BANDAGES/DRESSINGS) IMPLANT
DRSG MEPILEX BORDER 4X4 (GAUZE/BANDAGES/DRESSINGS) ×3 IMPLANT
DRSG TEGADERM 4X4.75 (GAUZE/BANDAGES/DRESSINGS) IMPLANT
DURAPREP 26ML APPLICATOR (WOUND CARE) ×3 IMPLANT
ELECT REM PT RETURN 9FT ADLT (ELECTROSURGICAL)
ELECTRODE REM PT RTRN 9FT ADLT (ELECTROSURGICAL) IMPLANT
GAUZE SPONGE 4X4 12PLY STRL (GAUZE/BANDAGES/DRESSINGS) IMPLANT
GLOVE BIO SURGEON STRL SZ7.5 (GLOVE) ×6 IMPLANT
GLOVE BIOGEL PI IND STRL 8 (GLOVE) ×1 IMPLANT
GLOVE BIOGEL PI INDICATOR 8 (GLOVE) ×2
GOWN STRL REUS W/ TWL LRG LVL3 (GOWN DISPOSABLE) ×1 IMPLANT
GOWN STRL REUS W/TWL LRG LVL3 (GOWN DISPOSABLE) ×2
GUIDEWIRE ASNIS 3.2 NONCAL (WIRE) ×9 IMPLANT
KIT BASIN OR (CUSTOM PROCEDURE TRAY) ×3 IMPLANT
KIT ROOM TURNOVER OR (KITS) ×3 IMPLANT
LINER BOOT UNIVERSAL DISP (MISCELLANEOUS) ×3 IMPLANT
MANIFOLD NEPTUNE II (INSTRUMENTS) IMPLANT
NS IRRIG 1000ML POUR BTL (IV SOLUTION) ×3 IMPLANT
PACK GENERAL/GYN (CUSTOM PROCEDURE TRAY) ×3 IMPLANT
PAD ARMBOARD 7.5X6 YLW CONV (MISCELLANEOUS) ×6 IMPLANT
SCREW ASNIS 6.5X105MM (Screw) ×2 IMPLANT
SCREW CANN ASNIS 6.5X105 STRL (Screw) ×1 IMPLANT
SCREW CANNULATED ASNIS 6.5X100 (Screw) ×6 IMPLANT
STAPLER VISISTAT 35W (STAPLE) ×3 IMPLANT
SUT MON AB 2-0 CT1 36 (SUTURE) ×3 IMPLANT
SUT VIC AB 0 CT1 27 (SUTURE) ×2
SUT VIC AB 0 CT1 27XBRD ANBCTR (SUTURE) ×1 IMPLANT
TOWEL OR 17X24 6PK STRL BLUE (TOWEL DISPOSABLE) ×3 IMPLANT
TOWEL OR 17X26 10 PK STRL BLUE (TOWEL DISPOSABLE) ×3 IMPLANT
TOWEL OR NON WOVEN STRL DISP B (DISPOSABLE) ×3 IMPLANT
WATER STERILE IRR 1000ML POUR (IV SOLUTION) IMPLANT

## 2013-12-06 NOTE — Anesthesia Postprocedure Evaluation (Signed)
Anesthesia Post Note  Patient: Clifford Cook  Procedure(s) Performed: Procedure(s) (LRB): CANNULATED HIP PINNING (Left)  Anesthesia type: general  Patient location: PACU  Post pain: Pain level controlled  Post assessment: Patient's Cardiovascular Status Stable  Last Vitals:  Filed Vitals:   12/06/13 1754  BP: 98/66  Pulse:   Temp:   Resp:     Post vital signs: Reviewed and stable  Level of consciousness: sedated  Complications: No apparent anesthesia complications

## 2013-12-06 NOTE — Progress Notes (Signed)
Utilization review completed. Zamere Pasternak, RN, BSN. 

## 2013-12-06 NOTE — Op Note (Signed)
12/05/2013 - 12/06/2013  5:14 PM  PATIENT:  Clifford Cook    PRE-OPERATIVE DIAGNOSIS:  left femoral neck fracture  POST-OPERATIVE DIAGNOSIS:  Same  PROCEDURE:  CANNULATED HIP PINNING  SURGEON:  Ayva Veilleux, D, MD  ASSISTANT: Joya Gaskins, OPA, He was necessary for efficiency and safety of the case.   ANESTHESIA:   General  PREOPERATIVE INDICATIONS:  Clifford Cook is a  68 y.o. male who fell and was found to have a diagnosis of left femoral neck fracture who elected for surgical management.    The risks benefits and alternatives were discussed with the patient preoperatively including but not limited to the risks of infection, bleeding, nerve injury, cardiopulmonary complications, blood clots, malunion, nonunion, avascular necrosis, the need for revision surgery, the potential for conversion to hemiarthroplasty, among others, and the patient was willing to proceed.  OPERATIVE IMPLANTS: 6.5 mm cannulated screws x3  OPERATIVE FINDINGS: Clinical osteoporosis with weak bone, proximal femur  OPERATIVE PROCEDURE: The patient was brought to the operating room and placed in supine position. IV antibiotics were given. General anesthesia administered. Foley was also given. The patient was placed on the fracture table. The operative extremity was positioned, without any significant reduction maneuver and was prepped and draped in usual sterile fashion.  Time out was performed.  Small incisions were made distal to the greater trochanter, and 3 guidewires were introduced Into an inverted triangle configuration. The lengths were measured. The reduction was slightly valgus, and near-anatomic. I opened the cortex with a cannulated drill, and then placed the screws into position. Satisfactory fixation was achieved. I sequentially tightened the screws by hand.  I performed a live fluoroscopic exam and no screw penetrance was noted. All threads crossed the fracture site.   The wounds were irrigated  copiously, and repaired with Vicryl with Steri-Strips and sterile gauze. There no complications and the patient tolerated the procedure well.  The patient will be weightbearing as tolerated, VTE prophylaxis will be: SCD's and he is autocoagulated because of his liver disease   This note was generated using a template and dragon dictation system. In light of that, I have reviewed the note and all aspects of it are applicable to this case. Any dictation errors are due to the computerized dictation system.

## 2013-12-06 NOTE — Progress Notes (Signed)
CRITICAL VALUE ALERT  Critical value received:  Positive Blood Cultures in aerobic bottle. Gram + cocci in pairs and clusters. Date of notification:  12/06/2013  Time of notification:  2200  Critical value read back:Yes.    Nurse who received alert:  K. Royden Purl  MD notified (1st page):  Dr. Carles Collet  Time of first page:  2205  MD notified (2nd page):  Time of second page:  Responding MD:  Dr. Carles Collet  Time MD responded:  2210

## 2013-12-06 NOTE — Transfer of Care (Signed)
Immediate Anesthesia Transfer of Care Note  Patient: Clifford Cook  Procedure(s) Performed: Procedure(s): CANNULATED HIP PINNING (Left)  Patient Location: PACU  Anesthesia Type:General  Level of Consciousness: awake and alert   Airway & Oxygen Therapy: Patient Spontanous Breathing and Patient connected to nasal cannula oxygen  Post-op Assessment: Report given to PACU RN, Post -op Vital signs reviewed and stable and Patient moving all extremities X 4  Post vital signs: Reviewed and stable  Complications: No apparent anesthesia complications

## 2013-12-06 NOTE — Anesthesia Procedure Notes (Signed)
Procedure Name: Intubation Date/Time: 12/06/2013 4:25 PM Performed by: Garrison Columbus T Pre-anesthesia Checklist: Patient identified, Emergency Drugs available, Suction available and Patient being monitored Patient Re-evaluated:Patient Re-evaluated prior to inductionOxygen Delivery Method: Circle system utilized Preoxygenation: Pre-oxygenation with 100% oxygen Intubation Type: IV induction and Rapid sequence Ventilation: Mask ventilation without difficulty and Oral airway inserted - appropriate to patient size Grade View: Grade I Tube type: Oral Tube size: 7.5 mm Number of attempts: 1 Airway Equipment and Method: Stylet,  Video-laryngoscopy and Oral airway Placement Confirmation: ETT inserted through vocal cords under direct vision,  positive ETCO2 and breath sounds checked- equal and bilateral Secured at: 22 cm Tube secured with: Tape Dental Injury: Teeth and Oropharynx as per pre-operative assessment

## 2013-12-06 NOTE — Anesthesia Preprocedure Evaluation (Addendum)
Anesthesia Evaluation  Patient identified by MRN, date of birth, ID band Patient awake and Patient confused  General Assessment Comment:Generalized weakness  Reviewed: Allergy & Precautions, H&P , NPO status , Patient's Chart, lab work & pertinent test results  History of Anesthesia Complications Negative for: history of anesthetic complications  Airway Mallampati: II  TM Distance: >3 FB Neck ROM: Full    Dental  (+) Poor Dentition, Loose, Dental Advisory Given,    Pulmonary COPDCurrent Smoker,          Cardiovascular Rhythm:Regular Rate:Tachycardia  Abnormal EKG , tachy. Has been hypotensive on floor   Neuro/Psych Pt has been confused    GI/Hepatic PUD, GERD-  ,(+) Cirrhosis -      ,   Endo/Other    Renal/GU      Musculoskeletal   Abdominal   Peds  Hematology  (+) anemia , Coagulopathy likely   Anesthesia Other Findings Low plts. elevated ammonia levels  Reproductive/Obstetrics                        Anesthesia Physical Anesthesia Plan  ASA: IV  Anesthesia Plan: General   Post-op Pain Management:    Induction: Intravenous  Airway Management Planned: Oral ETT  Additional Equipment:   Intra-op Plan:   Post-operative Plan: Extubation in OR and Possible Post-op intubation/ventilation  Informed Consent: I have reviewed the patients History and Physical, chart, labs and discussed the procedure including the risks, benefits and alternatives for the proposed anesthesia with the patient or authorized representative who has indicated his/her understanding and acceptance.   Dental advisory given  Plan Discussed with: CRNA and Surgeon  Anesthesia Plan Comments:       Anesthesia Quick Evaluation

## 2013-12-06 NOTE — Progress Notes (Signed)
ANTIBIOTIC CONSULT NOTE - Follow up  Pharmacy Consult for Vancomycin / Primaxin Indication: Sepsis  Allergies  Allergen Reactions  . Pulmicort [Budesonide] Shortness Of Breath    Causes stridor  . Penicillins Other (See Comments)    Rash hives, white spots.    Patient Measurements: Height: 5\' 9"  (175.3 cm) Weight: 216 lb 7.9 oz (98.2 kg) IBW/kg (Calculated) : 70.7 Adjusted Body Weight:   Vital Signs: Temp: 98 F (36.7 C) (11/23 1526) Temp Source: Oral (11/23 1526) BP: 95/54 mmHg (11/23 1526) Pulse Rate: 128 (11/23 0415) Intake/Output from previous day: 11/22 0701 - 11/23 0700 In: 240 [P.O.:240] Out: 400 [Urine:400] Intake/Output from this shift:    Labs:  Recent Labs  12/05/13 1233 12/06/13 0630  WBC 6.3 6.1  HGB 8.1* 7.8*  PLT PLATELET CLUMPS NOTED ON SMEAR, COUNT APPEARS DECREASED 80*  CREATININE 1.16 1.10   Estimated Creatinine Clearance: 73.2 mL/min (by C-G formula based on Cr of 1.1). No results for input(s): VANCOTROUGH, VANCOPEAK, VANCORANDOM, GENTTROUGH, GENTPEAK, GENTRANDOM, TOBRATROUGH, TOBRAPEAK, TOBRARND, AMIKACINPEAK, AMIKACINTROU, AMIKACIN in the last 72 hours.   Microbiology: Recent Results (from the past 720 hour(s))  Body fluid culture     Status: None   Collection Time: 11/15/13  5:06 PM  Result Value Ref Range Status   Specimen Description ABDOMEN ASCITES  Final   Special Requests Normal  Final   Gram Stain   Final    NO WBC SEEN NO ORGANISMS SEEN Performed at [facility]    Culture   Final    NO GROWTH 3 DAYS Performed at [facility]    Report Status 11/19/2013 FINAL  Final  Blood Culture (routine x 2)     Status: None   Collection Time: 11/25/13  2:29 AM  Result Value Ref Range Status   Specimen Description BLOOD RIGHT ANTECUBITAL  Final   Special Requests BOTTLES DRAWN AEROBIC AND ANAEROBIC 8CC  Final   Culture  Setup Time   Final    11/25/2013 09:03 Performed at [facility]    Culture   Final   NO GROWTH 5 DAYS Performed at [facility]    Report Status 12/01/2013 FINAL  Final  Blood Culture (routine x 2)     Status: None   Collection Time: 11/25/13  2:51 AM  Result Value Ref Range Status   Specimen Description BLOOD PORTA CATH PICC  Final   Special Requests BOTTLES DRAWN AEROBIC AND ANAEROBIC 3CC  Final   Culture  Setup Time   Final    11/25/2013 09:02 Performed at [facility]    Culture   Final    NO GROWTH 5 DAYS Performed at [facility]    Report Status 12/01/2013 FINAL  Final  Urine culture     Status: None   Collection Time: 11/25/13  3:46 AM  Result Value Ref Range Status   Specimen Description URINE, CATHETERIZED  Final   Special Requests NONE  Final   Culture  Setup Time   Final    11/25/2013 09:10 Performed at [place] Performed at [facility]   Final   Culture NO GROWTH Performed at [facility]   Final   Report Status 11/26/2013 FINAL  Final  Culture, blood (routine x 2)     Status: None (Preliminary result)   Collection Time: 12/05/13  5:08 PM  Result Value Ref Range Status   Specimen Description BLOOD LEFT HAND  4 ML IN [facility] BOTTLE  Final   Special Requests NONE  Final   Culture  Setup Time   Final    12/05/2013 21:36 Performed at [facility]    Culture   Final           BLOOD CULTURE RECEIVED NO GROWTH TO DATE CULTURE WILL BE HELD FOR 5 DAYS BEFORE ISSUING A FINAL NEGATIVE REPORT Performed at [facility]    Report Status PENDING  Incomplete  Culture, blood (routine x 2)     Status: None (Preliminary result)   Collection Time: 12/05/13  5:11 PM  Result Value Ref Range Status   Specimen Description BLOOD PICC  5 ML IN [facility] BOTTLE  Final   Special Requests NONE  Final   Culture  Setup Time   Final    12/05/2013 21:36 Performed at [facility]    Culture   Final           BLOOD CULTURE RECEIVED NO GROWTH TO DATE CULTURE WILL BE HELD  FOR 5 DAYS BEFORE ISSUING A FINAL NEGATIVE REPORT Performed at [facility]    Report Status PENDING  Incomplete  MRSA PCR Screening     Status: None   Collection Time: 12/06/13 12:48 AM  Result Value Ref Range Status   MRSA by PCR NEGATIVE NEGATIVE Final    Comment:        The GeneXpert MRSA Assay (FDA approved for NASAL specimens only), is one component of a comprehensive MRSA colonization surveillance program. It is not intended to diagnose MRSA infection nor to guide or monitor treatment for MRSA infections.     Medical History: Past Medical History  Diagnosis Date  . Stomach ulcer   . GI bleed   . Liver disorder   . Cirrhosis   . GERD (gastroesophageal reflux disease)    Assessment: 66 yoM with PMHx liver cirrhosis, recurrent ascites, coagulopathy, and recent hospitalization with sepsis 2/2 PNA with strep viridans bacteremia s/p 4 weeks of Invanz presents to [facility] with left hip pain after a fall.  Noted to be tachycardic and hypotensive with borderline lactic acid elevation.  Pharmacy consulted to dose vancomycin and primaxin for SIRS.  Noted allergy to penicillins - pt has tolerated carbapenems in the past.   11/22 >> Vancomycin  >> 11/22 >> Primaxin >>    Tmax: Afebrile WBCs: WNL Renal: SCr 1.16, CrCl 73  11/22 blood: IP  Goal of Therapy:  Vancomycin trough level 15-20 mcg/ml Eradication of infection  Plan:  Vancomycin 2g IV x 1, then 1g IV q12h  Increase Primaxin to 500mg  IV q6h F/u renal fxn, cultures, clinical course, vancomycin trough as indicated  Ralene Bathe, PharmD, BCPS 12/06/2013, 3:54 PM  Pager: [phone]

## 2013-12-06 NOTE — Discharge Instructions (Signed)
Bear weight as tolerated  Keep your insicion covered and dry until follow up

## 2013-12-06 NOTE — Progress Notes (Signed)
PROGRESS NOTE  Clifford Cook OXB:[id number] DOB: 01-Oct-1945 DOA: 12/05/2013 PCP: [name], MD  Assessment/Plan: Hip fracture - status post mechanical fall. Orthopedic surgery (Dr. Percell Miller) was consulted for repair  Liver cirrhosis - he has elements of decompensation with the presence of ascites, elevated bilirubin, coagulopathy. His MELD is 17 and will be at moderate to high risk for surgery.  -from this standpoint he is as optimal as he could be for surgery.  SIRS - patient with tachycardia and hypotension. He is fairly asymptomatic, per family he is at baseline.  -discontinue Lasix and spironolactone for now, obtain blood cultures. His chest x-ray is without edema, however has left basilar atelectasis, given recent history would favor restarting broad-spectrum antibiotics for possible PNA which can be later discontinued if blood cultures remain negative. Small 250 cc bolus.  Ascites - not tense, has just recently had the paracentesis on 11/25/2013. He may need another paracentesis prior to discharge this time around.   Coagulopathy - Due to liver disease, s/p vitamin K 5 mg in preparation for surgery.   Anemia - His hemoglobin is 8.1 currently, stable for him, no evidence of an active bleed. Continue iron supplements. - may need transfusion after surgery if < 7  Thrombocytopenia - Due to his liver disease  Hyponatremia - Mild, continue to monitor   History of hepatic encephalopathy - continue lactulose. Ammonia level high- will do enema as patient is NPO  Code Status: full Family Communication: wife/daughter Disposition Plan:    Consultants:  ortho  Procedures:      HPI/Subjective: Doing well, mouth dry  Objective: Filed Vitals:   12/06/13 0700  BP:   Pulse:   Temp: 98.4 F (36.9 C)  Resp:     Intake/Output Summary (Last 24 hours) at 12/06/13 1014 Last data filed at 12/06/13 0602  Gross per 24 hour  Intake    240 ml  Output    400 ml  Net   -160 ml     Filed Weights   12/05/13 2218  Weight: 98.2 kg (216 lb 7.9 oz)    Exam:   General:  A+Ox3, NAD  Cardiovascular: rrr  Respiratory: clear  Abdomen: +BS, soft- fluid  Musculoskeletal: no edema   Data Reviewed: Basic Metabolic Panel:  Recent Labs Lab 12/05/13 1233 12/06/13 0630  NA 133* 134*  K 4.4 4.3  CL 95* 98  CO2 26 26  GLUCOSE 146* 75  BUN 14 14  CREATININE 1.16 1.10  CALCIUM 8.1* 7.7*   Liver Function Tests:  Recent Labs Lab 12/05/13 1233 12/06/13 0630  AST 40* 37  ALT 34 31  ALKPHOS 166* 156*  BILITOT 2.3* 2.2*  PROT 5.5* 5.2*  ALBUMIN 1.8* 1.7*   No results for input(s): LIPASE, AMYLASE in the last 168 hours.  Recent Labs Lab 12/05/13 1708  AMMONIA 95*   CBC:  Recent Labs Lab 12/05/13 1233 12/06/13 0630  WBC 6.3 6.1  NEUTROABS 4.4  --   HGB 8.1* 7.8*  HCT 23.9* 23.3*  MCV 94.8 92.1  PLT PLATELET CLUMPS NOTED ON SMEAR, COUNT APPEARS DECREASED 80*   Cardiac Enzymes:  Recent Labs Lab 12/05/13 1233  TROPONINI <0.30   BNP (last 3 results)  Recent Labs  11/15/13 1237  PROBNP 864.3*   CBG: No results for input(s): GLUCAP in the last 168 hours.  Recent Results (from the past 240 hour(s))  MRSA PCR Screening     Status: None   Collection Time: 12/06/13 12:48 AM  Result Value  Ref Range Status   MRSA by PCR NEGATIVE NEGATIVE Final    Comment:        The GeneXpert MRSA Assay (FDA approved for NASAL specimens only), is one component of a comprehensive MRSA colonization surveillance program. It is not intended to diagnose MRSA infection nor to guide or monitor treatment for MRSA infections.      Studies: Dg Hip Complete Left  12/05/2013   CLINICAL DATA:  Fall 2 days ago on left side. Left posterior left upper leg pain radiating to groin area. Bruising. Initial encounter.  EXAM: LEFT HIP - COMPLETE 2+ VIEW  COMPARISON:  None.  FINDINGS: There is a left femoral neck fracture. Mild valgus angulation. No subluxation or  dislocation. Mild degenerative changes in the hips bilaterally.  IMPRESSION: Left femoral neck fracture with mild valgus angulation.   Electronically Signed   By: Rolm Baptise M.D.   On: 12/05/2013 14:11   Dg Femur Left  12/05/2013   CLINICAL DATA:  Fall onto left side.  Left hip and groin pain.  EXAM: LEFT FEMUR - 2 VIEW  COMPARISON:  Left hip series performed today.  FINDINGS: There is a left femoral neck fracture. Mild valgus angulation. No subluxation or dislocation. No additional femoral abnormality.  IMPRESSION: Left femoral neck fracture with mild valgus angulation.   Electronically Signed   By: Rolm Baptise M.D.   On: 12/05/2013 14:11   Dg Chest Port 1 View  12/05/2013   CLINICAL DATA:  Tachycardia, hip fracture  EXAM: PORTABLE CHEST - 1 VIEW  COMPARISON:  None.  FINDINGS: Normal cardiac silhouette. There is a left PICC line with tip in distal SVC. Chronic bronchitic markings are noted. No pulmonary edema or pneumothorax. Mild left basilar atelectasis.  IMPRESSION: No interval change. Chronic bronchitic markings and left basilar atelectasis.   Electronically Signed   By: Suzy Bouchard M.D.   On: 12/05/2013 16:39    Scheduled Meds: . feeding supplement (RESOURCE BREEZE)  1 Container Oral TID BM  . ferrous sulfate  325 mg Oral BID WC  . imipenem-cilastatin  500 mg Intravenous Q8H  . lactulose  20 g Oral TID  . magnesium oxide  400 mg Oral Daily  . pantoprazole  40 mg Oral Daily  . sodium chloride  3 mL Intravenous Q12H  . ursodiol  300 mg Oral BID  . vancomycin  1,000 mg Intravenous Q12H  . [START ON 12/08/2013] Vitamin D (Ergocalciferol)  50,000 Units Oral Q Wed   Continuous Infusions:  Antibiotics Given (last 72 hours)    Date/Time Action Medication Dose Rate   12/06/13 0400 Given   imipenem-cilastatin (PRIMAXIN) 500 mg in sodium chloride 0.9 % 100 mL IVPB 500 mg 200 mL/hr      Principal Problem:   Hip fracture Active Problems:   H/O: GI bleed   Liver cirrhosis secondary  to NASH   Ascites   Generalized weakness   Hyponatremia   Anemia   Thrombocytopenia   Coagulopathy   Tachycardia   SIRS (systemic inflammatory response syndrome)   Lactic acidosis    Time spent: 35 min    Clifford Cook, [place] [facility] Pager [phone]. If 7PM-7AM, please contact night-coverage at [email or web address], password [facility] 12/06/2013, 10:14 AM  LOS: 1 day

## 2013-12-07 DIAGNOSIS — E871 Hypo-osmolality and hyponatremia: Secondary | ICD-10-CM

## 2013-12-07 LAB — VANCOMYCIN, TROUGH: VANCOMYCIN TR: 21.9 ug/mL — AB (ref 10.0–20.0)

## 2013-12-07 MED ORDER — POTASSIUM CHLORIDE CRYS ER 20 MEQ PO TBCR
40.0000 meq | EXTENDED_RELEASE_TABLET | Freq: Every day | ORAL | Status: DC
  Administered 2013-12-07 – 2013-12-09 (×3): 40 meq via ORAL
  Filled 2013-12-07 (×3): qty 2

## 2013-12-07 MED ORDER — VANCOMYCIN HCL IN DEXTROSE 750-5 MG/150ML-% IV SOLN
750.0000 mg | Freq: Two times a day (BID) | INTRAVENOUS | Status: DC
  Administered 2013-12-08: 750 mg via INTRAVENOUS
  Filled 2013-12-07 (×2): qty 150

## 2013-12-07 MED ORDER — SPIRONOLACTONE 100 MG PO TABS
100.0000 mg | ORAL_TABLET | Freq: Every day | ORAL | Status: DC
  Administered 2013-12-07 – 2013-12-13 (×7): 100 mg via ORAL
  Filled 2013-12-07 (×8): qty 1

## 2013-12-07 MED ORDER — FUROSEMIDE 40 MG PO TABS
40.0000 mg | ORAL_TABLET | Freq: Every day | ORAL | Status: DC
  Administered 2013-12-07 – 2013-12-08 (×2): 40 mg via ORAL
  Filled 2013-12-07 (×2): qty 1

## 2013-12-07 NOTE — Progress Notes (Signed)
ANTIBIOTIC CONSULT NOTE - Follow up  Pharmacy Consult for Vancomycin / Primaxin Indication: Sepsis  Allergies  Allergen Reactions  . Pulmicort [Budesonide] Shortness Of Breath    Causes stridor  . Penicillins Other (See Comments)    Rash hives, white spots.    Patient Measurements: Height: 5\' 9"  (175.3 cm) Weight: 216 lb 7.9 oz (98.2 kg) IBW/kg (Calculated) : 70.7 Adjusted Body Weight:   Vital Signs: Temp: 98.2 F (36.8 C) (11/24 2039) Temp Source: Oral (11/24 2039) BP: 95/51 mmHg (11/24 2039) Pulse Rate: 117 (11/24 2039) Intake/Output from previous day: 11/23 0701 - 11/24 0700 In: 1261.5 [I.V.:561.5; IV [id number]:379] Out: 650 [Urine:600; Blood:50] Intake/Output from this shift:    Labs:  Recent Labs  12/05/13 1233 12/06/13 0630 12/06/13 1950  WBC 6.3 6.1 6.7  HGB 8.1* 7.8* 8.1*  PLT PLATELET CLUMPS NOTED ON SMEAR, COUNT APPEARS DECREASED 80* 72*  CREATININE 1.16 1.10  --    Estimated Creatinine Clearance: 73.2 mL/min (by C-G formula based on Cr of 1.1).  Recent Labs  12/07/13 1930  [place] 21.9*     Microbiology: Recent Results (from the past 720 hour(s))  Body fluid culture     Status: None   Collection Time: 11/15/13  5:06 PM  Result Value Ref Range Status   Specimen Description ABDOMEN ASCITES  Final   Special Requests Normal  Final   Gram Stain   Final    NO WBC SEEN NO ORGANISMS SEEN Performed at [facility]    Culture   Final    NO GROWTH 3 DAYS Performed at [facility]    Report Status 11/19/2013 FINAL  Final  Blood Culture (routine x 2)     Status: None   Collection Time: 11/25/13  2:29 AM  Result Value Ref Range Status   Specimen Description BLOOD RIGHT ANTECUBITAL  Final   Special Requests BOTTLES DRAWN AEROBIC AND ANAEROBIC 8CC  Final   Culture  Setup Time   Final    11/25/2013 09:03 Performed at [facility]    Culture   Final    NO GROWTH 5 DAYS Performed at [facility]    Report  Status 12/01/2013 FINAL  Final  Blood Culture (routine x 2)     Status: None   Collection Time: 11/25/13  2:51 AM  Result Value Ref Range Status   Specimen Description BLOOD PORTA CATH PICC  Final   Special Requests BOTTLES DRAWN AEROBIC AND ANAEROBIC 3CC  Final   Culture  Setup Time   Final    11/25/2013 09:02 Performed at [facility]    Culture   Final    NO GROWTH 5 DAYS Performed at [facility]    Report Status 12/01/2013 FINAL  Final  Urine culture     Status: None   Collection Time: 11/25/13  3:46 AM  Result Value Ref Range Status   Specimen Description URINE, CATHETERIZED  Final   Special Requests NONE  Final   Culture  Setup Time   Final    11/25/2013 09:10 Performed at [place] Performed at [facility]   Final   Culture NO GROWTH Performed at [facility]   Final   Report Status 11/26/2013 FINAL  Final  Culture, blood (routine x 2)     Status: None (Preliminary result)   Collection Time: 12/05/13  5:08 PM  Result Value Ref Range Status   Specimen Description BLOOD  LEFT HAND  4 ML IN [facility] BOTTLE  Final   Special Requests NONE  Final   Culture  Setup Time   Final    12/05/2013 21:36 Performed at [facility]    Culture   Final           BLOOD CULTURE RECEIVED NO GROWTH TO DATE CULTURE WILL BE HELD FOR 5 DAYS BEFORE ISSUING A FINAL NEGATIVE REPORT Performed at [facility]    Report Status PENDING  Incomplete  Culture, blood (routine x 2)     Status: None (Preliminary result)   Collection Time: 12/05/13  5:11 PM  Result Value Ref Range Status   Specimen Description BLOOD PICC  5 ML IN [facility] BOTTLE  Final   Special Requests NONE  Final   Culture  Setup Time   Final    12/05/2013 21:36 Performed at [facility]    Culture   Final    [place] IN PAIRS AND CHAINS Note: Gram Stain Report Called to,Read Back By and Verified With: KENYAN USSERY ON  12/06/2013 AT 9:36P BY WILEJ Performed at [facility]    Report Status PENDING  Incomplete  MRSA PCR Screening     Status: None   Collection Time: 12/06/13 12:48 AM  Result Value Ref Range Status   MRSA by PCR NEGATIVE NEGATIVE Final    Comment:        The GeneXpert MRSA Assay (FDA approved for NASAL specimens only), is one component of a comprehensive MRSA colonization surveillance program. It is not intended to diagnose MRSA infection nor to guide or monitor treatment for MRSA infections.     Medical History: Past Medical History  Diagnosis Date  . Stomach ulcer   . GI bleed   . Liver disorder   . Cirrhosis   . GERD (gastroesophageal reflux disease)    Assessment: 17 yoM with PMHx liver cirrhosis, recurrent ascites, coagulopathy, and recent hospitalization with sepsis 2/2 PNA with strep viridans bacteremia s/p 4 weeks of Invanz presents to [facility] with left hip pain after a fall.  Noted to be tachycardic and hypotensive with borderline lactic acid elevation.  Pharmacy consulted to dose vancomycin and primaxin for SIRS.  Noted allergy to penicillins - pt has tolerated carbapenems in the past.   11/22 >> Vancomycin  >> 11/22 >> Primaxin >>    Pt remains afebrile, WBC wnl, sCr 1.1 with CrCl ~ 65 mL/min. Initial vancomycin trough is supratherapeutic at 21.9 on vancomycin 1g q12h. The subsequent dose of vancomycin was given before the level finalized so will reduce next dose.   Goal of Therapy:  Vancomycin trough level 15-20 mcg/ml Eradication of infection  Plan:  Decrease vancomycin to 750mg  IV q12h Continue primaxin to 500mg  IV q6h F/u renal fxn, cultures, clinical course, vancomycin trough as indicated  Andrey Cota. Diona Foley, PharmD Clinical Pharmacist Pager [phone] 12/07/2013, 8:45 PM

## 2013-12-07 NOTE — Progress Notes (Signed)
PT Cancellation Note  Patient Details Name: Clifford Cook MRN: [id number] DOB: 01-Sep-1945   Cancelled Treatment:    Reason Eval/Treat Not Completed: Patient not medically ready.  Pt continues to be on strict bedrest.  Please advance activity order when appropriate for mobility.     Aibhlinn Kalmar, Thornton Papas 12/07/2013, 12:24 PM

## 2013-12-07 NOTE — Clinical Social Work Note (Signed)
CSW Consult Acknowledged:   CSW received consult for SNF placement. CSW reviewed chart. PT's recommendation reflects Home Health PT. Given PT's recommendation CSW will signed off. If needed please reconsult.   [place], MSW, [place]

## 2013-12-07 NOTE — Progress Notes (Signed)
Orthopedic Tech Progress Note Patient Details:  Clifford Cook 04-26-1945 [id number]  Ortho Devices Ortho Device/Splint Location: trapeze bar patient helper Ortho Device/Splint Interventions: Application   Hildred Priest 12/07/2013, 8:43 AM

## 2013-12-07 NOTE — Evaluation (Signed)
Occupational Therapy Evaluation Patient Details Name: Clifford Cook MRN: [id number] DOB: 08/27/1945 Today's Date: 12/07/2013    History of Present Illness pt presents post fall with L hip fx post cannulated pinning.     Clinical Impression   Pt s/p above. Pt moving well. Education provided to pt and family and they verbalize understanding. OT signing off. Recommending 24/7 supervision/assist upon d/c.     Follow Up Recommendations  No OT follow up;Supervision/Assistance - 24 hour    Equipment Recommendations  None recommended by OT    Recommendations for Other Services       Precautions / Restrictions Precautions Precautions: Fall Restrictions Weight Bearing Restrictions: Yes LLE Weight Bearing: Weight bearing as tolerated      Mobility Bed Mobility Overal bed mobility: Needs Assistance Bed Mobility: Supine to Sit;Sit to Supine     Supine to sit: Supervision Sit to supine: Modified independent (Device/Increase time)     Transfers Overall transfer level: Needs assistance Equipment used: Rolling walker (2 wheeled) Transfers: Sit to/from Stand Sit to Stand: Min guard                 ADL Overall ADL's : Needs assistance/impaired     Grooming: Wash/dry face;Oral care;Min guard;Standing               Lower Body Dressing: Moderate assistance;Sit to/from stand   Toilet Transfer: Min guard;Ambulation;RW (bed)           Functional mobility during ADLs: Min guard;Rolling walker General ADL Comments: Educated on safety tips for home (safe shoewear, use of bag on walker, rugs, sitting for most of LB ADLs, recommended someone be with pt for shower transfer). Educated on LB dressing technique and AE.  educated on tub transfer technique and options for shower chair. Pt performed grooming at sink. Discussed d/c plans.     Vision                     Perception     Praxis      Pertinent Vitals/Pain Pain Assessment: 0-10 Pain Score: 4  Pain  Location: left hip Pain Descriptors / Indicators: Sore Pain Intervention(s): Monitored during session   HR around 120 when OT arrived and elevated to 140's session. Nurse notified. O2 in 80's-90's on RA. O2 placed back on at end of session. Educated on breathing technique.      Hand Dominance     Extremity/Trunk Assessment Upper Extremity Assessment Upper Extremity Assessment: Overall WFL for tasks assessed   Lower Extremity Assessment Lower Extremity Assessment: Defer to PT evaluation LLE Deficits / Details: AROM WFL, Strength grossly limited post-op.   LLE Coordination: decreased fine motor   Cervical / Trunk Assessment Cervical / Trunk Assessment: Normal   Communication Communication Communication: No difficulties   Cognition Arousal/Alertness: Awake/alert Behavior During Therapy: WFL for tasks assessed/performed Overall Cognitive Status: Within Functional Limits for tasks assessed                     General Comments          Shoulder Instructions      Home Living Family/patient expects to be discharged to:: Private residence Living Arrangements: Spouse/significant other Available Help at Discharge: Family;Available 24 hours/day Type of Home: House Home Access: Stairs to enter [employer] of Steps: 1 Entrance Stairs-Rails: None Home Layout: One level     Bathroom Shower/Tub: Teacher, early years/pre: Handicapped height     Home Equipment: Environmental consultant -  2 wheels;Bedside commode;Adaptive equipment Adaptive Equipment: Reacher;Sock aid;Long-handled shoe horn;Long-handled sponge Additional Comments: pt has a single step down to the den.        Prior Functioning/Environment Level of Independence: Independent             OT Diagnosis: Acute pain   OT Problem List:     OT Treatment/Interventions:      OT Goals(Current goals can be found in the care plan section)   OT Frequency:     Barriers to D/C:             Co-evaluation              End of Session Equipment Utilized During Treatment: Gait belt;Rolling walker Nurse Communication: Mobility status;Other (comment) (HR and hand bleeding, leaking IV as well)  Activity Tolerance: Patient tolerated treatment well Patient left: in bed;with call bell/phone within reach;with family/visitor present   Time: 0454-0981 OT Time Calculation (min): 25 min Charges:  OT General Charges $OT Visit: 1 Procedure OT Evaluation $Initial OT Evaluation Tier I: 1 Procedure OT Treatments $Self Care/Home Management : 8-22 mins G-CodesBenito Mccreedy OTR/L [phone] 12/07/2013, 5:45 PM

## 2013-12-07 NOTE — Evaluation (Signed)
Physical Therapy Evaluation Patient Details Name: Clifford Cook MRN: [id number] DOB: 24-Feb-1945 Today's Date: 12/07/2013   History of Present Illness  pt presents post fall with L hip fx post cannulated pinning.    Clinical Impression  Pt generally weak and debilitated post fall with hip fx.  Pt states his wife and daughter are able to help him at D/C and that he is not interested in going to a facility for further rehab.  Pt has all needed DME and would benefit from HHPT at D/C.  Will follow acutely.      Follow Up Recommendations Home health PT;Supervision/Assistance - 24 hour    Equipment Recommendations  None recommended by PT    Recommendations for Other Services       Precautions / Restrictions Precautions Precautions: Fall Restrictions Weight Bearing Restrictions: Yes LLE Weight Bearing: Weight bearing as tolerated      Mobility  Bed Mobility Overal bed mobility: Needs Assistance Bed Mobility: Supine to Sit     Supine to sit: Min assist     General bed mobility comments: A with L LE only.  pt utilizes bed rails for A.    Transfers Overall transfer level: Needs assistance Equipment used: Rolling walker (2 wheeled) Transfers: Sit to/from Stand Sit to Stand: Min assist         General transfer comment: cues for UE use and controlling descent to sitting.    Ambulation/Gait Ambulation/Gait assistance: Min guard Ambulation Distance (Feet): 10 Feet Assistive device: Rolling walker (2 wheeled) Gait Pattern/deviations: Step-to pattern;Decreased step length - right;Decreased stance time - left;Trunk flexed     General Gait Details: pt moves slowly and ambulation limited by overall feeling weak per pt.  pt demos good use of RW.    Stairs            Wheelchair Mobility    Modified Rankin (Stroke Patients Only)       Balance Overall balance assessment: Needs assistance Sitting-balance support: No upper extremity supported;Feet supported Sitting  balance-Leahy Scale: Fair     Standing balance support: Bilateral upper extremity supported;During functional activity Standing balance-Leahy Scale: Poor                               Pertinent Vitals/Pain Pain Assessment: 0-10 Pain Score: 4  Pain Location: L hip Pain Descriptors / Indicators: Aching Pain Intervention(s): Monitored during session;Premedicated before session    Home Living Family/patient expects to be discharged to:: Private residence Living Arrangements: Spouse/significant other Available Help at Discharge: Family;Available 24 hours/day Type of Home: House Home Access: Stairs to enter Entrance Stairs-Rails: None Entrance Stairs-Number of Steps: 1 Home Layout: One level Home Equipment: Walker - 2 wheels;Bedside commode Additional Comments: pt has a single step down to the den.      Prior Function Level of Independence: Independent               Hand Dominance        Extremity/Trunk Assessment   Upper Extremity Assessment: Defer to OT evaluation           Lower Extremity Assessment: LLE deficits/detail   LLE Deficits / Details: AROM WFL, Strength grossly limited post-op.    Cervical / Trunk Assessment: Normal  Communication   Communication: No difficulties  Cognition Arousal/Alertness: Awake/alert Behavior During Therapy: WFL for tasks assessed/performed Overall Cognitive Status: Within Functional Limits for tasks assessed  General Comments      Exercises General Exercises - Lower Extremity Ankle Circles/Pumps: AROM;Both;10 reps Quad Sets: AROM;Left;10 reps Long Arc Quad: AROM;Left;10 reps Hip ABduction/ADduction: AAROM;Left;10 reps Hip Flexion/Marching: AROM;Both;10 reps      Assessment/Plan    PT Assessment Patient needs continued PT services  PT Diagnosis Difficulty walking   PT Problem List Decreased strength;Decreased activity tolerance;Decreased balance;Decreased  mobility;Decreased knowledge of use of DME  PT Treatment Interventions DME instruction;Gait training;Stair training;Functional mobility training;Therapeutic activities;Therapeutic exercise;Balance training;Patient/family education   PT Goals (Current goals can be found in the Care Plan section) Acute Rehab PT Goals Patient Stated Goal: Home.   PT Goal Formulation: With patient Time For Goal Achievement: 12/14/13 Potential to Achieve Goals: Good    Frequency Min 5X/week   Barriers to discharge        Co-evaluation               End of Session Equipment Utilized During Treatment: Gait belt;Oxygen Activity Tolerance: Patient limited by fatigue Patient left: in chair;with call bell/phone within reach Nurse Communication: Mobility status         Time: 0100-7121 PT Time Calculation (min) (ACUTE ONLY): 29 min   Charges:   PT Evaluation $Initial PT Evaluation Tier I: 1 Procedure PT Treatments $Gait Training: 8-22 mins $Therapeutic Exercise: 8-22 mins   PT G CodesCatarina Hartshorn, Crockett 12/07/2013, 3:05 PM

## 2013-12-07 NOTE — Progress Notes (Signed)
Patient ID: Clifford Cook, male   DOB: Aug 19, 1945, 68 y.o.   MRN: [id number]     Subjective:  Patient reports pain as mild to moderate.  Patient not aware of time and place.  Does not follow commands.  Objective:   VITALS:   Filed Vitals:   12/07/13 0400 12/07/13 0700 12/07/13 0749 12/07/13 1121  BP:   92/46   Pulse:   129   Temp:  98.3 F (36.8 C)  98.3 F (36.8 C)  TempSrc:  Oral  Oral  Resp: 13  21   Height:      Weight:      SpO2: 91%  97%     ABD soft Sensation intact distally Dorsiflexion/Plantar flexion intact Incision: dressing C/D/I and no drainage   Lab Results  Component Value Date   WBC 6.7 12/06/2013   HGB 8.1* 12/06/2013   HCT 24.3* 12/06/2013   MCV 92.7 12/06/2013   PLT 72* 12/06/2013     Assessment/Plan: 1 Day Post-Op   Principal Problem:   Hip fracture Active Problems:   H/O: GI bleed   Liver cirrhosis secondary to NASH   Ascites   Generalized weakness   Hyponatremia   Anemia   Thrombocytopenia   Coagulopathy   Tachycardia   SIRS (systemic inflammatory response syndrome)   Lactic acidosis   Advance diet Up with therapy  Continue plan per medicine WBAT Dry dressing PRN ABLA continue to monitor per Medicine   Remonia Richter 12/07/2013, 1:27 PM   Edmonia Lynch MD [phone]

## 2013-12-07 NOTE — Progress Notes (Signed)
PROGRESS NOTE  Clifford Cook POE:[id number] DOB: [birth year] DOA: 12/05/2013 PCP: [name], MD  Assessment/Plan: Hip fracture - status post mechanical fall. Orthopedic surgery (Dr. Percell Miller) was consulted for repair  Liver cirrhosis - stable  SIRS - patient with tachycardia and hypotension. He is fairly asymptomatic, per family he is at baseline.  -chest x-ray is without edema, however has left basilar atelectasis, given recent history would favor restarting broad-spectrum antibiotics for possible PNA which can be later discontinued if blood cultures remain negative. Small 250 cc bolus. 1/2 BC +: monitor- ? Contaminant  Ascites - not tense, has just recently had the paracentesis on 11/25/2013. He may need another paracentesis prior to discharge this time around.  -resume diuretics  Coagulopathy - Due to liver disease, s/p vitamin K 5 mg in preparation for surgery.   Anemia - His hemoglobin is 8.1 currently, stable for him, no evidence of an active bleed. Continue iron supplements. - may need transfusion  if < 7  Thrombocytopenia - Due to his liver disease  Hyponatremia - Mild, continue to monitor   History of hepatic encephalopathy - continue lactulose.  -added xifaxan  Code Status: full Family Communication: wife/daughter 11/23 Disposition Plan: watch in SDU overnight and transfer to floor in AM   Consultants:  ortho  Procedures:  Cannulated hip pinning    HPI/Subjective: SOB at night  Objective: Filed Vitals:   12/07/13 0400  BP:   Pulse:   Temp:   Resp: 13    Intake/Output Summary (Last 24 hours) at 12/07/13 0752 Last data filed at 12/07/13 0650  Gross per 24 hour  Intake 1251.5 ml  Output    650 ml  Net  601.5 ml   Filed Weights   12/05/13 2218  Weight: 98.2 kg (216 lb 7.9 oz)    Exam:   General:  A+Ox3, NAD  Cardiovascular: rrr  Respiratory: clear  Abdomen: +BS, soft- fluid  Musculoskeletal: no edema   Data Reviewed: Basic  Metabolic Panel:  Recent Labs Lab 12/05/13 1233 12/06/13 0630  NA 133* 134*  K 4.4 4.3  CL 95* 98  CO2 26 26  GLUCOSE 146* 75  BUN 14 14  CREATININE 1.16 1.10  CALCIUM 8.1* 7.7*   Liver Function Tests:  Recent Labs Lab 12/05/13 1233 12/06/13 0630  AST 40* 37  ALT 34 31  ALKPHOS 166* 156*  BILITOT 2.3* 2.2*  PROT 5.5* 5.2*  ALBUMIN 1.8* 1.7*   No results for input(s): LIPASE, AMYLASE in the last 168 hours.  Recent Labs Lab 12/05/13 1708  AMMONIA 95*   CBC:  Recent Labs Lab 12/05/13 1233 12/06/13 0630 12/06/13 1950  WBC 6.3 6.1 6.7  NEUTROABS 4.4  --   --   HGB 8.1* 7.8* 8.1*  HCT 23.9* 23.3* 24.3*  MCV 94.8 92.1 92.7  PLT PLATELET CLUMPS NOTED ON SMEAR, COUNT APPEARS DECREASED 80* 72*   Cardiac Enzymes:  Recent Labs Lab 12/05/13 1233  TROPONINI <0.30   BNP (last 3 results)  Recent Labs  11/15/13 1237  PROBNP 864.3*   CBG: No results for input(s): GLUCAP in the last 168 hours.  Recent Results (from the past 240 hour(s))  Culture, blood (routine x 2)     Status: None (Preliminary result)   Collection Time: 12/05/13  5:08 PM  Result Value Ref Range Status   Specimen Description BLOOD LEFT HAND  4 ML IN [facility] BOTTLE  Final   Special Requests NONE  Final   Culture  Setup Time  Final    12/05/2013 21:36 Performed at [facility]    Culture   Final           BLOOD CULTURE RECEIVED NO GROWTH TO DATE CULTURE WILL BE HELD FOR 5 DAYS BEFORE ISSUING A FINAL NEGATIVE REPORT Performed at [facility]    Report Status PENDING  Incomplete  Culture, blood (routine x 2)     Status: None (Preliminary result)   Collection Time: 12/05/13  5:11 PM  Result Value Ref Range Status   Specimen Description BLOOD PICC  5 ML IN [facility] BOTTLE  Final   Special Requests NONE  Final   Culture  Setup Time   Final    12/05/2013 21:36 Performed at [facility]    Culture   Final    [place] IN PAIRS AND CHAINS Note: Gram Stain  Report Called to,Read Back By and Verified With: KENYAN USSERY ON 12/06/2013 AT 9:36P BY WILEJ Performed at [facility]    Report Status PENDING  Incomplete  MRSA PCR Screening     Status: None   Collection Time: 12/06/13 12:48 AM  Result Value Ref Range Status   MRSA by PCR NEGATIVE NEGATIVE Final    Comment:        The GeneXpert MRSA Assay (FDA approved for NASAL specimens only), is one component of a comprehensive MRSA colonization surveillance program. It is not intended to diagnose MRSA infection nor to guide or monitor treatment for MRSA infections.      Studies: Dg Hip Complete Left  12/05/2013   CLINICAL DATA:  Fall 2 days ago on left side. Left posterior left upper leg pain radiating to groin area. Bruising. Initial encounter.  EXAM: LEFT HIP - COMPLETE 2+ VIEW  COMPARISON:  None.  FINDINGS: There is a left femoral neck fracture. Mild valgus angulation. No subluxation or dislocation. Mild degenerative changes in the hips bilaterally.  IMPRESSION: Left femoral neck fracture with mild valgus angulation.   Electronically Signed   By: Rolm Baptise M.D.   On: 12/05/2013 14:11   Dg Hip Operative Left  12/06/2013   CLINICAL DATA:  Left femoral neck fracture.  EXAM: OPERATIVE LEFT HIP  COMPARISON:  Radiographs dated 12/05/2013  FINDINGS: AP and lateral C-arm images demonstrate 3 pins have been inserted across the left femoral neck fracture. Alignment and position of the fracture fragments is near anatomic.  IMPRESSION: Open reduction and internal fixation of left femoral neck fracture.   Electronically Signed   By: Rozetta Nunnery M.D.   On: 12/06/2013 18:07   Dg Femur Left  12/05/2013   CLINICAL DATA:  Fall onto left side.  Left hip and groin pain.  EXAM: LEFT FEMUR - 2 VIEW  COMPARISON:  Left hip series performed today.  FINDINGS: There is a left femoral neck fracture. Mild valgus angulation. No subluxation or dislocation. No additional femoral abnormality.  IMPRESSION: Left  femoral neck fracture with mild valgus angulation.   Electronically Signed   By: Rolm Baptise M.D.   On: 12/05/2013 14:11   Pelvis Portable  12/06/2013   CLINICAL DATA:  Status post cannulated hip pinning  EXAM: PORTABLE PELVIS 1-2 VIEWS  COMPARISON:  12/05/2013  FINDINGS: Three Knowles pins have then used to reduce the left subcapital femoral neck fracture. The fracture fragments are in anatomic alignment. There is no complication identified.  IMPRESSION: 1. Status post ORIF of left hip fracture.   Electronically Signed   By: Queen Slough.D.  On: 12/06/2013 23:13   Dg Chest Port 1 View  12/05/2013   CLINICAL DATA:  Tachycardia, hip fracture  EXAM: PORTABLE CHEST - 1 VIEW  COMPARISON:  None.  FINDINGS: Normal cardiac silhouette. There is a left PICC line with tip in distal SVC. Chronic bronchitic markings are noted. No pulmonary edema or pneumothorax. Mild left basilar atelectasis.  IMPRESSION: No interval change. Chronic bronchitic markings and left basilar atelectasis.   Electronically Signed   By: Suzy Bouchard M.D.   On: 12/05/2013 16:39    Scheduled Meds: . sodium chloride   Intravenous Once  . sodium chloride   Intravenous Once  . feeding supplement (RESOURCE BREEZE)  1 Container Oral TID BM  . ferrous sulfate  325 mg Oral BID WC  . imipenem-cilastatin  500 mg Intravenous 4 times per day  . lactulose  20 g Oral TID  . magnesium oxide  400 mg Oral Daily  . pantoprazole  40 mg Oral Daily  . rifaximin  550 mg Oral BID  . sodium chloride  3 mL Intravenous Q12H  . ursodiol  300 mg Oral BID  . vancomycin  1,000 mg Intravenous Q12H  . [START ON 12/08/2013] Vitamin D (Ergocalciferol)  50,000 Units Oral Q Wed   Continuous Infusions: . lactated ringers 10 mL/hr at 12/06/13 1900   Antibiotics Given (last 72 hours)    Date/Time Action Medication Dose Rate   12/06/13 0400 Given   imipenem-cilastatin (PRIMAXIN) 500 mg in sodium chloride 0.9 % 100 mL IVPB 500 mg 200 mL/hr   12/06/13  1116 Given   vancomycin (VANCOCIN) IVPB 1000 mg/200 mL premix 1,000 mg 200 mL/hr   12/06/13 1321 Given   imipenem-cilastatin (PRIMAXIN) 500 mg in sodium chloride 0.9 % 100 mL IVPB 500 mg 200 mL/hr   12/06/13 1651 Given   ceFAZolin (ANCEF) IVPB 2 g/50 mL premix 2 g    12/06/13 1850 Given   imipenem-cilastatin (PRIMAXIN) 500 mg in sodium chloride 0.9 % 100 mL IVPB 500 mg 200 mL/hr   12/06/13 1951 Given   vancomycin (VANCOCIN) IVPB 1000 mg/200 mL premix 1,000 mg 200 mL/hr   12/06/13 2201 Given   rifaximin (XIFAXAN) tablet 550 mg 550 mg    12/06/13 2202 Given   ceFAZolin (ANCEF) IVPB 2 g/50 mL premix 2 g 100 mL/hr   12/07/13 0100 Given   imipenem-cilastatin (PRIMAXIN) 500 mg in sodium chloride 0.9 % 100 mL IVPB 500 mg 200 mL/hr   12/07/13 8299 Given   ceFAZolin (ANCEF) IVPB 2 g/50 mL premix 2 g 100 mL/hr   12/07/13 0557 Given   imipenem-cilastatin (PRIMAXIN) 500 mg in sodium chloride 0.9 % 100 mL IVPB 500 mg 200 mL/hr      Principal Problem:   Hip fracture Active Problems:   H/O: GI bleed   Liver cirrhosis secondary to NASH   Ascites   Generalized weakness   Hyponatremia   Anemia   Thrombocytopenia   Coagulopathy   Tachycardia   SIRS (systemic inflammatory response syndrome)   Lactic acidosis    Time spent: 35 min    VANN, [facility] Pager [phone]. If 7PM-7AM, please contact night-coverage at [email or web address], password [facility] 12/07/2013, 7:52 AM  LOS: 2 days

## 2013-12-08 ENCOUNTER — Inpatient Hospital Stay (HOSPITAL_COMMUNITY): Payer: Medicare Other

## 2013-12-08 ENCOUNTER — Encounter (HOSPITAL_COMMUNITY): Payer: Self-pay | Admitting: Orthopedic Surgery

## 2013-12-08 DIAGNOSIS — I5033 Acute on chronic diastolic (congestive) heart failure: Secondary | ICD-10-CM

## 2013-12-08 DIAGNOSIS — I4892 Unspecified atrial flutter: Secondary | ICD-10-CM

## 2013-12-08 DIAGNOSIS — S72001D Fracture of unspecified part of neck of right femur, subsequent encounter for closed fracture with routine healing: Secondary | ICD-10-CM

## 2013-12-08 DIAGNOSIS — R7881 Bacteremia: Secondary | ICD-10-CM

## 2013-12-08 DIAGNOSIS — S7290XA Unspecified fracture of unspecified femur, initial encounter for closed fracture: Secondary | ICD-10-CM

## 2013-12-08 DIAGNOSIS — D649 Anemia, unspecified: Secondary | ICD-10-CM

## 2013-12-08 DIAGNOSIS — B955 Unspecified streptococcus as the cause of diseases classified elsewhere: Secondary | ICD-10-CM

## 2013-12-08 DIAGNOSIS — K7460 Unspecified cirrhosis of liver: Secondary | ICD-10-CM

## 2013-12-08 DIAGNOSIS — K7290 Hepatic failure, unspecified without coma: Secondary | ICD-10-CM

## 2013-12-08 DIAGNOSIS — K7682 Hepatic encephalopathy: Secondary | ICD-10-CM

## 2013-12-08 HISTORY — DX: Unspecified atrial flutter: I48.92

## 2013-12-08 LAB — CULTURE, BLOOD (ROUTINE X 2)

## 2013-12-08 LAB — TSH: TSH: 4.23 u[IU]/mL (ref 0.350–4.500)

## 2013-12-08 LAB — BASIC METABOLIC PANEL
Anion gap: 9 (ref 5–15)
BUN: 11 mg/dL (ref 6–23)
CHLORIDE: 98 meq/L (ref 96–112)
CO2: 25 mEq/L (ref 19–32)
Calcium: 7.5 mg/dL — ABNORMAL LOW (ref 8.4–10.5)
Creatinine, Ser: 0.92 mg/dL (ref 0.50–1.35)
GFR calc non Af Amer: 84 mL/min — ABNORMAL LOW (ref >90)
GLUCOSE: 107 mg/dL — AB (ref 70–99)
POTASSIUM: 4 meq/L (ref 3.7–5.3)
Sodium: 132 mEq/L — ABNORMAL LOW (ref 137–147)

## 2013-12-08 LAB — PREPARE RBC (CROSSMATCH)

## 2013-12-08 LAB — CBC
HCT: 23 % — ABNORMAL LOW (ref 39.0–52.0)
HEMOGLOBIN: 7.7 g/dL — AB (ref 13.0–17.0)
MCH: 31.2 pg (ref 26.0–34.0)
MCHC: 33.5 g/dL (ref 30.0–36.0)
MCV: 93.1 fL (ref 78.0–100.0)
Platelets: 71 10*3/uL — ABNORMAL LOW (ref 150–400)
RBC: 2.47 MIL/uL — ABNORMAL LOW (ref 4.22–5.81)
RDW: 18.8 % — ABNORMAL HIGH (ref 11.5–15.5)
WBC: 6.2 10*3/uL (ref 4.0–10.5)

## 2013-12-08 LAB — MAGNESIUM: Magnesium: 1.8 mg/dL (ref 1.5–2.5)

## 2013-12-08 MED ORDER — CEFTRIAXONE SODIUM IN DEXTROSE 20 MG/ML IV SOLN
1.0000 g | INTRAVENOUS | Status: DC
  Administered 2013-12-08: 1 g via INTRAVENOUS
  Filled 2013-12-08: qty 50

## 2013-12-08 MED ORDER — SODIUM CHLORIDE 0.9 % IV SOLN: Freq: Once | INTRAVENOUS | Status: DC

## 2013-12-08 MED ORDER — CEFTRIAXONE SODIUM IN DEXTROSE 40 MG/ML IV SOLN
2.0000 g | INTRAVENOUS | Status: DC
  Administered 2013-12-09 – 2013-12-14 (×6): 2 g via INTRAVENOUS
  Filled 2013-12-08 (×6): qty 50

## 2013-12-08 MED ORDER — FUROSEMIDE 10 MG/ML IJ SOLN
40.0000 mg | Freq: Two times a day (BID) | INTRAMUSCULAR | Status: DC
  Administered 2013-12-08 – 2013-12-11 (×6): 40 mg via INTRAVENOUS
  Filled 2013-12-08 (×10): qty 4

## 2013-12-08 MED ORDER — FUROSEMIDE 10 MG/ML IJ SOLN
20.0000 mg | Freq: Once | INTRAMUSCULAR | Status: AC
  Administered 2013-12-08: 20 mg via INTRAVENOUS

## 2013-12-08 MED ORDER — METOPROLOL SUCCINATE ER 25 MG PO TB24
25.0000 mg | ORAL_TABLET | Freq: Two times a day (BID) | ORAL | Status: DC
  Administered 2013-12-08: 25 mg via ORAL
  Filled 2013-12-08 (×3): qty 1

## 2013-12-08 NOTE — Care Management Note (Addendum)
    Page 1 of 2   12/14/2013     2:43:09 PM CARE MANAGEMENT NOTE 12/14/2013  Patient:  Clifford Cook, Clifford Cook   Account Number:  [id number]  Date Initiated:  12/06/2013  Documentation initiated by:  [facility]  Subjective/Objective Assessment:   mech fall, hip fracture, SIRS  pt is active with [facility] for [facility] and HHPT, will resume.     Action/Plan:   PT/OT evals-hhpt   Anticipated DC Date:  12/14/2013   Anticipated DC Plan:  [place]  CM consult      [facility] Choice  Resumption Of Svcs/PTA Provider   Choice offered to / List presented to:  C-1 Patient   DME arranged  [facility]      DME agency  [place] arranged  HH-1 RN  IV Antibiotics  HH-2 PT  HH-3 OT  HH-4 [place]   Status of service:  Completed, signed off Medicare Important Message given?  YES (If response is "NO", the following Medicare IM given date fields will be blank) Date Medicare IM given:  12/08/2013 Medicare IM given by:  Marvetta Gibbons Date Additional Medicare IM given:  12/13/2013 Additional Medicare IM given by:  Tomi Bamberger  Discharge Disposition:  [employer]  Per UR Regulation:  Reviewed for med. necessity/level of care/duration of stay  If discussed at [place] of Stay Meetings, dates discussed:    Comments:  12/14/13 [place], BSN [place]  and [place] notified of patient's dc today.  NCM informed that patient will need home oxygen, referral made to [facility], [place] notified.  12/13/13 [place], BSN [phone] patient for tee today, awaiting duration time of iv abx from id.  plan is to resume  HHRN and HHPT with [facility] , pt for possible dc tomorrow. NCM notified Pam with [facility] and Mary with [place].  12/08/13- 54- Marvetta Gibbons RN, BSN [phone] Spoke with pt, wife and daughter at  bedside- per conversation  pt states that he is already active  with [place] for HH-RN/PT- will need resumption orders for discharge and add OT- pt already has needed DME to include BSC, RW< and shower chair- no other DME needs noted- pt has PICC line and may need additional IV abx at discharge- to have TEE tomorrow- NCM to f/u for additional needs-

## 2013-12-08 NOTE — Progress Notes (Signed)
Patient ID: Clifford Cook, male   DOB: Mar 15, 1945, 68 y.o.   MRN: [id number]     Subjective:  Patient sleeping but does wake does not follow commands.   Objective:   VITALS:   Filed Vitals:   12/08/13 0002 12/08/13 0353 12/08/13 0744 12/08/13 0800  BP: 100/53 91/52 95/58    Pulse: 118 128 123   Temp: 97.8 F (36.6 C) 97.7 F (36.5 C) 98.1 F (36.7 C)   TempSrc: Oral Axillary Oral   Resp: 19 19 11 12   Height:      Weight:      SpO2: 93% 97% 96% 96%    ABD soft Sensation intact distally Dorsiflexion/Plantar flexion intact Incision: dressing C/D/I and no drainage   Lab Results  Component Value Date   WBC 6.2 12/08/2013   HGB 7.7* 12/08/2013   HCT 23.0* 12/08/2013   MCV 93.1 12/08/2013   PLT 71* 12/08/2013     Assessment/Plan: 2 Days Post-Op   Principal Problem:   Hip fracture Active Problems:   H/O: GI bleed   Liver cirrhosis secondary to NASH   Ascites   Generalized weakness   Hyponatremia   Anemia   Thrombocytopenia   Coagulopathy   SIRS (systemic inflammatory response syndrome)   Lactic acidosis   Atrial flutter   Bacteremia   Advance diet Up with therapy  ABLA stable continue to monitor per Medicine Continue plan per medicine WBAT Dry dressing PRN Follow up with Dr Edmonia Lynch in two weeks   Rande Brunt, Erlene Quan 12/08/2013, 8:55 AM   Edmonia Lynch MD [phone]

## 2013-12-08 NOTE — Consult Note (Signed)
[facility]  Total days of antibiotics 4        Day 4 ceftriaxone        Day 4 vanco        Day 4 rifaximin       Reason for Consult: streptococcal bacteremia    Referring Physician: Conley Canal  Principal Problem:   Hip fracture Active Problems:   H/O: GI bleed   Liver cirrhosis secondary to NASH   Ascites   Generalized weakness   Hyponatremia   Anemia   Thrombocytopenia   Coagulopathy   SIRS (systemic inflammatory response syndrome)   Lactic acidosis   Atrial flutter   Bacteremia    HPI: Clifford Cook is a 68 y.o. male with history of cirrhosis 2/2 NASH, c/b thrombocytopenia, ascites, UGIB, hepatic encephalopathy. He was recently hospitalized in [facility] October for sepsis due to PNA,  Strep viridans bacteremia. He was deemed too high risk for TEE and TTE did not show evidence of vegetation. He did have LVP of 6.4L which was not c/w with SBP. He did state that the entry site for paracentesis took a while to heal since he had an ostomy bag collecting the ascites. He was treated for Strep viridans PNA/bacteremia for a total of 4 wk with ertapenem (abtx choice thought to be due to PCN allergy and minimizing kidney toxicity with vanco) to end of 11/29/13. He still has his picc line in place from that admission. He also reports increasing lower extremity edema bilaterally since being discontinued off of diuretics which may also contribute to abdominal girth. He denies fever, chills, abdominal pain. Has been using lactulose to have frequent BMs. he was readmitted on 11/23 after sustained a Left femoral neck fracture from ground level fall on 11/21. He underwent left hip pinning on 11/23. Cardiology consulted because patient was noted to be in atrial flutter with RVR 110s-120s, which possibly existed back in early November. Infectious diseases is consulted since he has 1 of 2 blood cx again positive for viridans strep.    Past Medical History  Diagnosis Date  . Stomach  ulcer   . GI bleed   . Liver disorder   . Cirrhosis   . GERD (gastroesophageal reflux disease)     Allergies:  Allergies  Allergen Reactions  . Pulmicort [Budesonide] Shortness Of Breath    Causes stridor  . Penicillins Other (See Comments)    Rash hives, white spots.    MEDICATIONS: . sodium chloride   Intravenous Once  . cefTRIAXone (ROCEPHIN)  IV  1 g Intravenous Q24H  . feeding supplement (RESOURCE BREEZE)  1 Container Oral TID BM  . ferrous sulfate  325 mg Oral BID WC  . furosemide  40 mg Intravenous BID  . lactulose  20 g Oral TID  . magnesium oxide  400 mg Oral Daily  . metoprolol succinate  25 mg Oral BID  . pantoprazole  40 mg Oral Daily  . potassium chloride SA  40 mEq Oral Daily  . rifaximin  550 mg Oral BID  . sodium chloride  3 mL Intravenous Q12H  . spironolactone  100 mg Oral Daily  . ursodiol  300 mg Oral BID  . Vitamin D (Ergocalciferol)  50,000 Units Oral Q Wed    History  Substance Use Topics  . Smoking status: Current Every Day Smoker -- 1.00 packs/day    Types: Cigarettes  . Smokeless tobacco: Never Used  . Alcohol Use: No  Comment: over i yr    History reviewed. No pertinent family history.  Review of Systems  Constitutional: Negative for fever, chills, diaphoresis, activity change, appetite change, fatigue and unexpected weight change.  HENT: Negative for congestion, sore throat, rhinorrhea, sneezing, trouble swallowing and sinus pressure.  Eyes: Negative for photophobia and visual disturbance.  Respiratory: Negative for cough, chest tightness, shortness of breath, wheezing and stridor.  Cardiovascular: Negative for chest pain, palpitations and leg swelling.  Gastrointestinal: Negative for nausea, vomiting, abdominal pain, diarrhea, constipation, blood in stool, abdominal distention and anal bleeding.  Genitourinary: Negative for dysuria, hematuria, flank pain and difficulty urinating.  Musculoskeletal: leg pain from fall Skin: Negative  for color change, pallor, rash and wound.  Neurological: Negative for dizziness, tremors, weakness and light-headedness.  Hematological: Negative for adenopathy. Does not bruise/bleed easily.  Psychiatric/Behavioral: Negative for behavioral problems, confusion, sleep disturbance, dysphoric mood, decreased concentration and agitation.    OBJECTIVE: Temp:  [97.7 F (36.5 C)-98.2 F (36.8 C)] 97.8 F (36.6 C) (11/25 1337) Pulse Rate:  [117-137] 137 (11/25 1337) Resp:  [11-21] 18 (11/25 1337) BP: (91-108)/(51-66) 98/66 mmHg (11/25 1337) SpO2:  [90 %-99 %] 90 % (11/25 1337) Weight:  [226 lb 13.7 oz (102.9 kg)] 226 lb 13.7 oz (102.9 kg) (11/25 1337) Physical Exam  Constitutional: He is oriented to person, place, and time. He appears well-developed and well-nourished. No distress.  HENT:  Mouth/Throat: Oropharynx is clear and moist. No oropharyngeal exudate. No scleral icterus Cardiovascular: tachy,irreg. Exam reveals no gallop and no friction rub.  No murmur heard. Elevated jvp Pulmonary/Chest: Effort normal and breath sounds normal. No respiratory distress. He has no wheezes.  Abdominal: Soft. Bowel sounds are normal. Protuberant abdomen with fluid wave. Lymphadenopathy:  He has no cervical adenopathy.  Neurological: He is alert and oriented to person, place, and time.  Skin: Scattered skin tears and echymosis to extremities. picc line on left upper arm c/d/i Psychiatric: He has a normal mood and affect. His behavior is normal.    LABS: Results for orders placed or performed during the hospital encounter of 12/05/13 (from the past 48 hour(s))  Prepare RBC     Status: None   Collection Time: 12/06/13  4:40 PM  Result Value Ref Range   Order Confirmation ORDER PROCESSED BY BLOOD BANK   Type and screen     Status: None (Preliminary result)   Collection Time: 12/06/13  4:40 PM  Result Value Ref Range   ABO/RH(D) O NEG    Antibody Screen NEG    Sample Expiration 12/09/2013    Unit  Number [id number]    Blood Component Type RED CELLS,LR    Unit division 00    Status of Unit ISSUED    Transfusion Status OK TO TRANSFUSE    Crossmatch Result Compatible    Unit Number [id number]    Blood Component Type RED CELLS,LR    Unit division 00    Status of Unit ALLOCATED    Transfusion Status OK TO TRANSFUSE    Crossmatch Result Compatible   ABO/Rh     Status: None   Collection Time: 12/06/13  4:40 PM  Result Value Ref Range   ABO/RH(D) O NEG   CBC     Status: Abnormal   Collection Time: 12/06/13  7:50 PM  Result Value Ref Range   WBC 6.7 4.0 - 10.5 K/uL   RBC 2.62 (L) 4.22 - 5.81 MIL/uL   Hemoglobin 8.1 (L) 13.0 - 17.0 g/dL   HCT 24.3 (L)  39.0 - 52.0 %   MCV 92.7 78.0 - 100.0 fL   MCH 30.9 26.0 - 34.0 pg   MCHC 33.3 30.0 - 36.0 g/dL   RDW 18.9 (H) 11.5 - 15.5 %   Platelets 72 (L) 150 - 400 K/uL    Comment: PLATELET COUNT CONFIRMED BY SMEAR  Vancomycin, trough     Status: Abnormal   Collection Time: 12/07/13  7:30 PM  Result Value Ref Range   Vancomycin Tr 21.9 (H) 10.0 - 20.0 ug/mL  CBC     Status: Abnormal   Collection Time: 12/08/13  6:09 AM  Result Value Ref Range   WBC 6.2 4.0 - 10.5 K/uL   RBC 2.47 (L) 4.22 - 5.81 MIL/uL   Hemoglobin 7.7 (L) 13.0 - 17.0 g/dL    Comment: CONSISTENT WITH PREVIOUS RESULT   HCT 23.0 (L) 39.0 - 52.0 %   MCV 93.1 78.0 - 100.0 fL   MCH 31.2 26.0 - 34.0 pg   MCHC 33.5 30.0 - 36.0 g/dL   RDW 18.8 (H) 11.5 - 15.5 %   Platelets 71 (L) 150 - 400 K/uL    Comment: CONSISTENT WITH PREVIOUS RESULT  Basic metabolic panel     Status: Abnormal   Collection Time: 12/08/13  6:09 AM  Result Value Ref Range   Sodium 132 (L) 137 - 147 mEq/L   Potassium 4.0 3.7 - 5.3 mEq/L   Chloride 98 96 - 112 mEq/L   CO2 25 19 - 32 mEq/L   Glucose, Bld 107 (H) 70 - 99 mg/dL   BUN 11 6 - 23 mg/dL   Creatinine, Ser 0.92 0.50 - 1.35 mg/dL   Calcium 7.5 (L) 8.4 - 10.5 mg/dL   GFR calc non Af Amer 84 (L) >90 mL/min   GFR calc Af Amer >90 >90  mL/min    Comment: (NOTE) The eGFR has been calculated using the CKD EPI equation. This calculation has not been validated in all clinical situations. eGFR's persistently <90 mL/min signify possible Chronic Kidney Disease.    Anion gap 9 5 - 15  Prepare RBC     Status: None   Collection Time: 12/08/13 10:00 AM  Result Value Ref Range   Order Confirmation ORDER PROCESSED BY BLOOD BANK     MICRO: 11/22 blood cx 1 of 2 strep viridans IMAGING: Dg Hip Operative Left  12/06/2013   CLINICAL DATA:  Left femoral neck fracture.  EXAM: OPERATIVE LEFT HIP  COMPARISON:  Radiographs dated 12/05/2013  FINDINGS: AP and lateral C-arm images demonstrate 3 pins have been inserted across the left femoral neck fracture. Alignment and position of the fracture fragments is near anatomic.  IMPRESSION: Open reduction and internal fixation of left femoral neck fracture.   Electronically Signed   By: Rozetta Nunnery M.D.   On: 12/06/2013 18:07   Pelvis Portable  12/06/2013   CLINICAL DATA:  Status post cannulated hip pinning  EXAM: PORTABLE PELVIS 1-2 VIEWS  COMPARISON:  12/05/2013  FINDINGS: Three Knowles pins have then used to reduce the left subcapital femoral neck fracture. The fracture fragments are in anatomic alignment. There is no complication identified.  IMPRESSION: 1. Status post ORIF of left hip fracture.   Electronically Signed   By: Kerby Moors M.D.   On: 12/06/2013 23:13    HISTORICAL MICRO/IMAGING 10/18 blood cx x 2 strep viridans (PCN 0.016)  Assessment/Plan:  68yo M with cirrhosis 2/2 NASH complicated by ascites, PSE, thrombocytopenia, recently treated for strep viridans bacteremia with carbapenem  until 11/16 who is readmitted for left femur fracture but also found to have recurrent strep viridans bacteremia. Concerning for endocarditis or other ongoing metastatic site as source of infection  - we will have his picc line pulled out now, place peripheral iv. Will have repeat blood culture  tomorrow - recommend to get TEE, as planned by cardiology - increase ceftriaxone to 2 gm iv daily. And d/c vancomycin - recommend getting diagnostic paracentesis using smallest gauge needle if possible  for cell count ,gram stain and culture. 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 Megan Salon to provide further recs over the holiday weekend  [place] [phone]

## 2013-12-08 NOTE — Progress Notes (Addendum)
Current and old chart reviewed.   PROGRESS NOTE  Clifford Cook BJS:[id number] DOB: [birth year] DOA: 12/05/2013 PCP: [name], MD  Assessment/Plan: Hip fracture - status post mechanical fall. S/p pinning.  SCDs only due to coagulopathy, thrombocytopenia and risk for bleed.  SIRS - 1/2 BC with GPC chains and pairs. I am concerned it may be same organism (strep viridans). Await culture results. May need ID consult.  Did not have TTE last admission, as felt to be too high risk.  D/c vancomycin. Narrow primaxin once culture results back.  Atrial fib/flutter:  No mention in previous notes, but EKG from 11/12 and 11/2 shows atrial fib/flutter. Not a candidate for anticoagulation due to coagulopathy and thrombocytopenia and history of GI bleed. Heart rate currently 120 to 130. Rate control problematic due to chronically low blood pressures. Will consult cardiology. May need amiodarone. Had an echocardiogram last admission which showed poor acoustic windows, but probably normal ejection fraction and dilated left atrium. Hemoglobin 7.7 today. Will transfuse a unit of blood which may help rate. Check TSH  NASH cirrhosis: diuretics resumed  Ascites - not tense, has just recently had the paracentesis on 11/25/2013, culture negative  Coagulopathy - Due to liver disease, s/p vitamin K 5 mg in preparation for surgery.   Anemia - hemoglobin 7.7. We'll transfuse 1 unit due to atrial fib flutter with rapid rate.  S/p 2 units earlier this admission  Thrombocytopenia - Due to his liver disease  Hyponatremia - Mild, continue to monitor   History of hepatic encephalopathy - continue lactulose. No clinical signs of encephalopathy currently. -added xifaxan  Continue step down unit today.  Code Status: full Family Communication: wife/daughter 11/23 Disposition Plan: home with home health   Consultants:  Ortho  cardiology  Procedures:  Cannulated hip pinning    HPI/Subjective: No  complaints  Objective: Filed Vitals:   12/08/13 0800  BP:   Pulse:   Temp:   Resp: 12    Intake/Output Summary (Last 24 hours) at 12/08/13 0854 Last data filed at 12/08/13 0748  Gross per 24 hour  Intake    470 ml  Output   1400 ml  Net   -930 ml   Filed Weights   12/05/13 2218  Weight: 98.2 kg (216 lb 7.9 oz)   tele: atrial fib/flutter rate 120 - 130 Exam:   General:  A+Ox3, NAD  Cardiovascular: tachycardic no mgr  Respiratory: clear without WRR  Abdomen: +BS, soft, nt, ascites present  Musculoskeletal: 2+ edema, no clubbing or cyanosis  Data Reviewed: Basic Metabolic Panel:  Recent Labs Lab 12/05/13 1233 12/06/13 0630 12/08/13 0609  NA 133* 134* 132*  K 4.4 4.3 4.0  CL 95* 98 98  CO2 26 26 25   GLUCOSE 146* 75 107*  BUN 14 14 11   CREATININE 1.16 1.10 0.92  CALCIUM 8.1* 7.7* 7.5*   Liver Function Tests:  Recent Labs Lab 12/05/13 1233 12/06/13 0630  AST 40* 37  ALT 34 31  ALKPHOS 166* 156*  BILITOT 2.3* 2.2*  PROT 5.5* 5.2*  ALBUMIN 1.8* 1.7*   No results for input(s): LIPASE, AMYLASE in the last 168 hours.  Recent Labs Lab 12/05/13 1708  AMMONIA 95*   CBC:  Recent Labs Lab 12/05/13 1233 12/06/13 0630 12/06/13 1950 12/08/13 0609  WBC 6.3 6.1 6.7 6.2  NEUTROABS 4.4  --   --   --   HGB 8.1* 7.8* 8.1* 7.7*  HCT 23.9* 23.3* 24.3* 23.0*  MCV 94.8 92.1 92.7 93.1  PLT PLATELET CLUMPS NOTED ON SMEAR, COUNT APPEARS DECREASED 80* 72* 71*   Cardiac Enzymes:  Recent Labs Lab 12/05/13 1233  TROPONINI <0.30   BNP (last 3 results)  Recent Labs  11/15/13 1237  PROBNP 864.3*   CBG: No results for input(s): GLUCAP in the last 168 hours.  Recent Results (from the past 240 hour(s))  Culture, blood (routine x 2)     Status: None (Preliminary result)   Collection Time: 12/05/13  5:08 PM  Result Value Ref Range Status   Specimen Description BLOOD LEFT HAND  4 ML IN [facility] BOTTLE  Final   Special Requests NONE  Final   Culture  Setup  Time   Final    12/05/2013 21:36 Performed at [facility]    Culture   Final           BLOOD CULTURE RECEIVED NO GROWTH TO DATE CULTURE WILL BE HELD FOR 5 DAYS BEFORE ISSUING A FINAL NEGATIVE REPORT Performed at [facility]    Report Status PENDING  Incomplete  Culture, blood (routine x 2)     Status: None (Preliminary result)   Collection Time: 12/05/13  5:11 PM  Result Value Ref Range Status   Specimen Description BLOOD PICC  5 ML IN [facility] BOTTLE  Final   Special Requests NONE  Final   Culture  Setup Time   Final    12/05/2013 21:36 Performed at [facility]   Final    Roy AND CHAINS Note: Gram Stain Report Called to,Read Back By and Verified With: KENYAN USSERY ON 12/06/2013 AT 9:36P BY WILEJ Performed at [facility]    Report Status PENDING  Incomplete  MRSA PCR Screening     Status: None   Collection Time: 12/06/13 12:48 AM  Result Value Ref Range Status   MRSA by PCR NEGATIVE NEGATIVE Final    Comment:        The GeneXpert MRSA Assay (FDA approved for NASAL specimens only), is one component of a comprehensive MRSA colonization surveillance program. It is not intended to diagnose MRSA infection nor to guide or monitor treatment for MRSA infections.      Studies: Dg Hip Operative Left  12/06/2013   CLINICAL DATA:  Left femoral neck fracture.  EXAM: OPERATIVE LEFT HIP  COMPARISON:  Radiographs dated 12/05/2013  FINDINGS: AP and lateral C-arm images demonstrate 3 pins have been inserted across the left femoral neck fracture. Alignment and position of the fracture fragments is near anatomic.  IMPRESSION: Open reduction and internal fixation of left femoral neck fracture.   Electronically Signed   By: Rozetta Nunnery M.D.   On: 12/06/2013 18:07   Pelvis Portable  12/06/2013   CLINICAL DATA:  Status post cannulated hip pinning  EXAM: PORTABLE PELVIS 1-2 VIEWS  COMPARISON:  12/05/2013  FINDINGS: Three  Knowles pins have then used to reduce the left subcapital femoral neck fracture. The fracture fragments are in anatomic alignment. There is no complication identified.  IMPRESSION: 1. Status post ORIF of left hip fracture.   Electronically Signed   By: Kerby Moors M.D.   On: 12/06/2013 23:13    Scheduled Meds: . sodium chloride   Intravenous Once  . sodium chloride   Intravenous Once  . feeding supplement (RESOURCE BREEZE)  1 Container Oral TID BM  . ferrous sulfate  325 mg Oral BID WC  . furosemide  40 mg Oral Daily  . imipenem-cilastatin  500 mg Intravenous 4 times per day  . lactulose  20 g Oral TID  . magnesium oxide  400 mg Oral Daily  . pantoprazole  40 mg Oral Daily  . potassium chloride SA  40 mEq Oral Daily  . rifaximin  550 mg Oral BID  . sodium chloride  3 mL Intravenous Q12H  . spironolactone  100 mg Oral Daily  . ursodiol  300 mg Oral BID  . vancomycin  750 mg Intravenous Q12H  . Vitamin D (Ergocalciferol)  50,000 Units Oral Q Wed   Continuous Infusions: . lactated ringers 10 mL/hr at 12/06/13 1900   Antibiotics Given (last 72 hours)    Date/Time Action Medication Dose Rate   12/06/13 0400 Given   imipenem-cilastatin (PRIMAXIN) 500 mg in sodium chloride 0.9 % 100 mL IVPB 500 mg 200 mL/hr   12/06/13 1116 Given   vancomycin (VANCOCIN) IVPB 1000 mg/200 mL premix 1,000 mg 200 mL/hr   12/06/13 1321 Given   imipenem-cilastatin (PRIMAXIN) 500 mg in sodium chloride 0.9 % 100 mL IVPB 500 mg 200 mL/hr   12/06/13 1651 Given   ceFAZolin (ANCEF) IVPB 2 g/50 mL premix 2 g    12/06/13 1850 Given   imipenem-cilastatin (PRIMAXIN) 500 mg in sodium chloride 0.9 % 100 mL IVPB 500 mg 200 mL/hr   12/06/13 1951 Given   vancomycin (VANCOCIN) IVPB 1000 mg/200 mL premix 1,000 mg 200 mL/hr   12/06/13 2201 Given   rifaximin (XIFAXAN) tablet 550 mg 550 mg    12/06/13 2202 Given   ceFAZolin (ANCEF) IVPB 2 g/50 mL premix 2 g 100 mL/hr   12/07/13 0100 Given   imipenem-cilastatin (PRIMAXIN)  500 mg in sodium chloride 0.9 % 100 mL IVPB 500 mg 200 mL/hr   12/07/13 4888 Given   ceFAZolin (ANCEF) IVPB 2 g/50 mL premix 2 g 100 mL/hr   12/07/13 0557 Given   imipenem-cilastatin (PRIMAXIN) 500 mg in sodium chloride 0.9 % 100 mL IVPB 500 mg 200 mL/hr   12/07/13 0900 Given   vancomycin (VANCOCIN) IVPB 1000 mg/200 mL premix 1,000 mg 200 mL/hr   12/07/13 0910 Given   rifaximin (XIFAXAN) tablet 550 mg 550 mg    12/07/13 1300 Given   imipenem-cilastatin (PRIMAXIN) 500 mg in sodium chloride 0.9 % 100 mL IVPB 500 mg 200 mL/hr   12/07/13 1800 Given   imipenem-cilastatin (PRIMAXIN) 500 mg in sodium chloride 0.9 % 100 mL IVPB 500 mg 200 mL/hr   12/07/13 1936 Given   vancomycin (VANCOCIN) IVPB 1000 mg/200 mL premix 1,000 mg 200 mL/hr   12/07/13 2125 Given   rifaximin (XIFAXAN) tablet 550 mg 550 mg    12/08/13 0003 Given   imipenem-cilastatin (PRIMAXIN) 500 mg in sodium chloride 0.9 % 100 mL IVPB 500 mg 200 mL/hr   12/08/13 0531 Given   imipenem-cilastatin (PRIMAXIN) 500 mg in sodium chloride 0.9 % 100 mL IVPB 500 mg 200 mL/hr   12/08/13 0748 Given   vancomycin (VANCOCIN) IVPB 750 mg/150 ml premix 750 mg 150 mL/hr     Time spent: 35 min  Jasan Doughtie L  [facility] Contact:  [email or web address], password [facility] 12/08/2013, 8:54 AM  LOS: 3 days

## 2013-12-08 NOTE — Progress Notes (Signed)
Physical Therapy Treatment Patient Details Name: Clifford Cook MRN: [id number] DOB: [birth year] Today's Date: 12/08/2013    History of Present Illness pt presents post fall with L hip fx post cannulated pinning.      PT Comments    Pt session limited to bed exercises today as pt in afib with HR to 130's while at rest.  During exercises HR max was 137 nonsustained.  Encouraged pt to perform LE exercises on his own throughout the day.  Will continue to follow.    Follow Up Recommendations  Home health PT;Supervision/Assistance - 24 hour     Equipment Recommendations  None recommended by PT    Recommendations for Other Services       Precautions / Restrictions Precautions Precautions: Fall Restrictions Weight Bearing Restrictions: Yes LLE Weight Bearing: Weight bearing as tolerated    Mobility  Bed Mobility                  Transfers                    Ambulation/Gait                 Stairs            Wheelchair Mobility    Modified Rankin (Stroke Patients Only)       Balance                                    Cognition Arousal/Alertness: Awake/alert Behavior During Therapy: WFL for tasks assessed/performed Overall Cognitive Status: Within Functional Limits for tasks assessed                      Exercises General Exercises - Lower Extremity Ankle Circles/Pumps: AROM;Both;15 reps Quad Sets: AROM;Left;15 reps Short Arc Quad: AROM;Left;15 reps Heel Slides: AROM;Left;10 reps Hip ABduction/ADduction: AROM;Left;10 reps Straight Leg Raises: AROM;Left;10 reps    General Comments        Pertinent Vitals/Pain Pain Assessment: 0-10 Pain Score: 2  Pain Location: L hip Pain Descriptors / Indicators: Sore Pain Intervention(s): Monitored during session;Premedicated before session    Home Living                      Prior Function            PT Goals (current goals can now be found in the care  plan section) Acute Rehab PT Goals Patient Stated Goal: Home.   PT Goal Formulation: With patient Time For Goal Achievement: 12/14/13 Potential to Achieve Goals: Good Progress towards PT goals: Not progressing toward goals - comment (Afib)    Frequency  Min 5X/week    PT Plan Current plan remains appropriate    Co-evaluation             End of Session Equipment Utilized During Treatment: Oxygen Activity Tolerance: Treatment limited secondary to medical complications (Comment) (pt in afib this morning.) Patient left: in bed;with call bell/phone within reach     Time: 0911-0922 PT Time Calculation (min) (ACUTE ONLY): 11 min  Charges:  $Therapeutic Exercise: 8-22 mins                    G CodesCatarina Hartshorn, [place] 12/08/2013, 9:56 AM

## 2013-12-08 NOTE — Progress Notes (Signed)
Medicare Important Message given? YES  (If response is "NO", the following Medicare IM given date fields will be blank)  Date Medicare IM given: 12/08/13 Medicare IM given by:  Frenchie Pribyl  

## 2013-12-08 NOTE — Consult Note (Signed)
CARDIOLOGY CONSULT NOTE  Patient ID: Clifford Cook MRN: [id number] DOB/AGE: Jan 02, 1946 68 y.o.  Admit date: 12/05/2013 Primary Physician: Kathyrn Lass Primary Cardiologist: New Reason for Consultation: Atrial flutter  HPI: 68 yo with history of cirrhosis from NASH, anemia, thrombocytopenia, ascites, hepatic encephalopathy, and recent Strep viridans PNA was readmitted after a fall and left hip fracture.  Patient was treated for Strep viridans PNA in 10/15.  Echo was very difficult study.  He did not have TEE as this was deemed too high risk.  He was put on Invanz via PICC to complete 4 weeks of therapy.  On 11/21, patient fell (tripped) and fractured left hip.  He had left hip pinning on 11/23.  Cardiology was called because patient was noted to be in atrial flutter with RVR 110s-120s.  I looked back, and he was in atrial flutter with RVR on his last 2 ECGs prior to admission (11/2 and 11/12).  He is currently not on rate control medications with SBP chronically in the 90s.    Prior to fall, patient reports chronic exertional dyspnea.  He is able to walk around the house without much trouble, however.  No chest pain.  He does not feel palpitations.  No history of cardiac disease that he knows of.  At last admission, troponin was mildly elevated but no workup was done.  Echo this admission was technically difficult but probably showed normal EF.  There was question of apical hypokinesis.   Review of systems complete and found to be negative unless listed above in HPI  Past Medical History: 1. Cirrhosis: Probably NASH. No history of varices.  Has anemia and thrombocytopenia.  Ascites, last paracentesis 11/25/13.  H/o hepatic encephalopathy. Coagulopathy with elevated INR. No history of varices.  2. Strep viridans PNA in 10/15, was on 4 weeks Invanz. 3. GERD 4. H/o GI bleed from gastric ulcer.  5. Anemia 6. Thrombocytopenia in setting of cirrhosis 7. Left hip fracture and repair 11/15.   FH: No  premature CAD  History   Social History  . Marital Status: Married    Spouse Name: N/A    Number of Children: N/A  . Years of Education: N/A   Occupational History  . Not on file.   Social History Main Topics  . Smoking status: Current Every Day Smoker -- 1.00 packs/day    Types: Cigarettes  . Smokeless tobacco: Never Used  . Alcohol Use: No     Comment: over i yr  . Drug Use: No  . Sexual Activity: No   Other Topics Concern  . Not on file   Social History Narrative   Patient is married. He is independent.     Prescriptions prior to admission  Medication Sig Dispense Refill Last Dose  . alendronate (FOSAMAX) 70 MG tablet Take 1 tablet (70 mg total) by mouth once a week. Hold until antibiotic therapy is completed   12/01/2013 at Unknown time  . diphenhydrAMINE (BENADRYL) 25 mg capsule Take 1 capsule (25 mg total) by mouth every 12 (twelve) hours as needed for itching. 30 capsule 0 12/04/2013 at qpm  . ferrous sulfate 325 (65 FE) MG tablet Take 1 tablet (325 mg total) by mouth 2 (two) times daily with a meal.  3 12/04/2013 at Unknown time  . furosemide (LASIX) 40 MG tablet Take 1 tablet (40 mg total) by mouth daily.   12/05/2013 at Unknown time  . HYDROmorphone (DILAUDID) 2 MG tablet Take 1-2 mg by mouth every 12 (twelve)  hours as needed for severe pain.   12/04/2013 at 0200  . lactulose (CHRONULAC) 10 GM/15ML solution Take 20 g by mouth 3 (three) times daily.    12/05/2013 at Unknown time  . Magnesium 250 MG TABS Take 250 mg by mouth daily.   12/04/2013 at Unknown time  . pantoprazole (PROTONIX) 40 MG tablet Take 1 tablet (40 mg total) by mouth daily.   12/05/2013 at Unknown time  . potassium chloride SA (K-DUR,KLOR-CON) 20 MEQ tablet Take 40 mEq by mouth daily.   12/05/2013 at Unknown time  . sorbitol 70 % SOLN Take 20 mLs by mouth daily as needed for moderate constipation (goal is 1-2 good BM's daily).   Past Week at Unknown time  . spironolactone (ALDACTONE) 50 MG tablet  Take 1 tablet (50 mg total) by mouth daily. (Patient taking differently: Take 100 mg by mouth daily. Take 2 --50 mg tabs to equal 100 mg QD total)   12/05/2013 at Unknown time  . temazepam (RESTORIL) 7.5 MG capsule Take 1 capsule (7.5 mg total) by mouth at bedtime as needed for sleep. 30 capsule 0 12/04/2013 at Unknown time  . traMADol (ULTRAM) 50 MG tablet Take 1 tablet (50 mg total) by mouth every 8 (eight) hours as needed for moderate pain or severe pain (pain). 90 tablet 5 12/05/2013 at Unknown time  . ursodiol (ACTIGALL) 500 MG tablet Take 250 mg by mouth 2 (two) times daily.   12/05/2013 at 1000  . Vitamin D, Ergocalciferol, (DRISDOL) 50000 UNITS CAPS capsule Take 50,000 Units by mouth every Wednesday.    12/01/2013  . feeding supplement, RESOURCE BREEZE, (RESOURCE BREEZE) LIQD Take 1 Container by mouth 3 (three) times daily between meals. (Patient not taking: Reported on 12/05/2013)  0 11/24/2013 at Unknown time  . potassium chloride 20 MEQ/15ML (10%) solution Take 40 mEq by mouth daily.   11/11/2013 at Unknown time   Current Scheduled Meds: . sodium chloride   Intravenous Once  . feeding supplement (RESOURCE BREEZE)  1 Container Oral TID BM  . ferrous sulfate  325 mg Oral BID WC  . furosemide  20 mg Intravenous Once  . furosemide  40 mg Intravenous BID  . imipenem-cilastatin  500 mg Intravenous 4 times per day  . lactulose  20 g Oral TID  . magnesium oxide  400 mg Oral Daily  . metoprolol succinate  25 mg Oral BID  . pantoprazole  40 mg Oral Daily  . potassium chloride SA  40 mEq Oral Daily  . rifaximin  550 mg Oral BID  . sodium chloride  3 mL Intravenous Q12H  . spironolactone  100 mg Oral Daily  . ursodiol  300 mg Oral BID  . Vitamin D (Ergocalciferol)  50,000 Units Oral Q Wed   Continuous Infusions: . lactated ringers 10 mL/hr at 12/06/13 1900   PRN Meds:.acetaminophen **OR** acetaminophen, diphenhydrAMINE, HYDROcodone-acetaminophen, HYDROmorphone, menthol-cetylpyridinium  **OR** phenol, morphine injection, temazepam   Physical exam Blood pressure 95/58, pulse 123, temperature 98.1 F (36.7 C), temperature source Oral, resp. rate 12, height 5\' 9"  (1.753 m), weight 216 lb 7.9 oz (98.2 kg), SpO2 96 %. General: NAD Neck: JVP 9-10 cm, no thyromegaly or thyroid nodule.  Lungs: Decreased breath sounds at bases bilaterally. CV: Nondisplaced PMI.  Heart tachy, irregular S1/S2, no S3/S4, no definite murmur heard.  1+ edema to knees bilaterally.  No carotid bruit.  Normal pedal pulses.  Abdomen: Distended, nontender.  Unable to palpate liver or spleen due to distention.  Skin:  Intact without lesions or rashes.  Neurologic: Alert and oriented x 3.  Psych: Normal affect. Extremities: No clubbing or cyanosis.  HEENT: Normal.   Labs:   Lab Results  Component Value Date   WBC 6.2 12/08/2013   HGB 7.7* 12/08/2013   HCT 23.0* 12/08/2013   MCV 93.1 12/08/2013   PLT 71* 12/08/2013    Recent Labs Lab 12/06/13 0630 12/08/13 0609  NA 134* 132*  K 4.3 4.0  CL 98 98  CO2 26 25  BUN 14 11  CREATININE 1.10 0.92  CALCIUM 7.7* 7.5*  PROT 5.2*  --   BILITOT 2.2*  --   ALKPHOS 156*  --   ALT 31  --   AST 37  --   GLUCOSE 75 107*   Lab Results  Component Value Date   TROPONINI <0.30 12/05/2013    EKG: ECGs from 11/2 and 11/12 showed atrial flutter.  ECG 11/25 with atrial flutter, rate 119  ASSESSMENT AND PLAN: 68 yo with history of cirrhosis from NASH, anemia, thrombocytopenia, ascites, hepatic encephalopathy, and recent Strep viridans PNA was readmitted after a fall and left hip fracture.  He was found to be in atrial flutter. 1. Atrial flutter: ECGs show atrial flutter with mild RVR back at least to 11/2.  HR currently 110s-120s.  He is cirrhotic and SBP runs in the 90s range.  He is not an anticoagulation candidate: fall risk, anemia, thrombocytopenia, coagulopathy, and h/o GI bleeding.  We discussed stroke risk and why he is not a good candidate for  anticoagulation.  He does need better rate control.  I will try him on Toprol XL 25 mg bid to see if his BP will tolerate.  Could add digoxin if needed.  Amiodarone would be difficult given his liver disease (and we do not envision cardioverting him to NSR) but if nothing else works we can consider it.  2. Strep viridans bacteremia: Patient has Strep viridans in his blood again.  I do not think that TEE is unreasonable here.  He has no history of significant esophageal varices and has had regular EGDs.  I would like a TEE both to look for endocarditis and to assess his LV/RV function (TTE very difficult).  As tomorrow is Thanksgiving, will arrange for Friday.  3. Acute on chronic ?diastolic CHF: Patient is volume overloaded with peripheral edema and elevated JVP.  Also with significant ascites.  - Lasix 40 mg IV bid and follow creatinine, I/Os.  - Continue spironolactone - Would consider another paracentesis.  - TEE to assess LV and RV.  4. Cirrhosis: Prominent ascites currently.  Would consider another paracentesis.   Loralie Champagne 12/08/2013 10:56 AM

## 2013-12-09 ENCOUNTER — Inpatient Hospital Stay (HOSPITAL_COMMUNITY): Payer: Medicare Other

## 2013-12-09 DIAGNOSIS — B954 Other streptococcus as the cause of diseases classified elsewhere: Secondary | ICD-10-CM

## 2013-12-09 LAB — BODY FLUID CELL COUNT WITH DIFFERENTIAL
Eos, Fluid: 0 %
Lymphs, Fluid: 13 %
Monocyte-Macrophage-Serous Fluid: 65 % (ref 50–90)
Neutrophil Count, Fluid: 22 % (ref 0–25)
Total Nucleated Cell Count, Fluid: 113 cu mm (ref 0–1000)

## 2013-12-09 LAB — COMPREHENSIVE METABOLIC PANEL
ALBUMIN: 1.6 g/dL — AB (ref 3.5–5.2)
ALT: 17 U/L (ref 0–53)
AST: 29 U/L (ref 0–37)
Alkaline Phosphatase: 163 U/L — ABNORMAL HIGH (ref 39–117)
Anion gap: 9 (ref 5–15)
BUN: 9 mg/dL (ref 6–23)
CALCIUM: 7.5 mg/dL — AB (ref 8.4–10.5)
CO2: 26 mEq/L (ref 19–32)
Chloride: 98 mEq/L (ref 96–112)
Creatinine, Ser: 0.96 mg/dL (ref 0.50–1.35)
GFR calc Af Amer: >90 mL/min (ref >90)
GFR calc non Af Amer: 83 mL/min — ABNORMAL LOW (ref >90)
Glucose, Bld: 149 mg/dL — ABNORMAL HIGH (ref 70–99)
Potassium: 3.6 mEq/L — ABNORMAL LOW (ref 3.7–5.3)
SODIUM: 133 meq/L — AB (ref 137–147)
Total Bilirubin: 1.8 mg/dL — ABNORMAL HIGH (ref 0.3–1.2)
Total Protein: 5.2 g/dL — ABNORMAL LOW (ref 6.0–8.3)

## 2013-12-09 LAB — CBC
HCT: 25.5 % — ABNORMAL LOW (ref 39.0–52.0)
Hemoglobin: 8.5 g/dL — ABNORMAL LOW (ref 13.0–17.0)
MCH: 30.8 pg (ref 26.0–34.0)
MCHC: 33.3 g/dL (ref 30.0–36.0)
MCV: 92.4 fL (ref 78.0–100.0)
PLATELETS: 70 10*3/uL — AB (ref 150–400)
RBC: 2.76 MIL/uL — ABNORMAL LOW (ref 4.22–5.81)
RDW: 19.2 % — ABNORMAL HIGH (ref 11.5–15.5)
WBC: 6.7 10*3/uL (ref 4.0–10.5)

## 2013-12-09 LAB — HIV ANTIBODY (ROUTINE TESTING W REFLEX): HIV 1&2 Ab, 4th Generation: NONREACTIVE

## 2013-12-09 LAB — HEPATITIS C ANTIBODY: HCV AB: NEGATIVE

## 2013-12-09 MED ORDER — METOPROLOL SUCCINATE 12.5 MG HALF TABLET
12.5000 mg | ORAL_TABLET | Freq: Two times a day (BID) | ORAL | Status: DC
  Administered 2013-12-09 – 2013-12-13 (×7): 12.5 mg via ORAL
  Filled 2013-12-09 (×15): qty 1

## 2013-12-09 MED ORDER — LIDOCAINE HCL (PF) 1 % IJ SOLN
INTRAMUSCULAR | Status: AC
  Filled 2013-12-09: qty 10

## 2013-12-09 MED ORDER — POTASSIUM CHLORIDE CRYS ER 20 MEQ PO TBCR
40.0000 meq | EXTENDED_RELEASE_TABLET | Freq: Two times a day (BID) | ORAL | Status: DC
  Administered 2013-12-09 – 2013-12-11 (×5): 40 meq via ORAL
  Filled 2013-12-09 (×8): qty 2

## 2013-12-09 NOTE — Procedures (Addendum)
Request for paracentesis, diagnostic only. Moderate volume of ascites noted. Successful US guided diagnostic paracentesis from RLQ.  Yielded 115mL of clear yellow fluid.  No immediate complications.  Pt tolerated well.   Specimen was sent for labs as ordered.  Ascencion Dike PA-C 12/09/2013 9:15 AM

## 2013-12-09 NOTE — Progress Notes (Signed)
Subjective:  Patient currently getting paracentesis.   Objective:  Vital Signs in the last 24 hours: Temp:  [97.8 F (36.6 C)-98.3 F (36.8 C)] 98.2 F (36.8 C) (11/26 0246) Pulse Rate:  [52-137] 106 (11/26 0600) Resp:  [11-18] 15 (11/26 0600) BP: (81-114)/(38-67) 85/52 mmHg (11/26 0600) SpO2:  [90 %-100 %] 93 % (11/26 0750) Weight:  [213 lb 3.2 oz (96.707 kg)-226 lb 13.7 oz (102.9 kg)] 213 lb 3.2 oz (96.707 kg) (11/26 0500)  Intake/Output from previous day: 11/25 0701 - 11/26 0700 In: 1348 [P.O.:525; I.V.:293; Blood:330; IV Piggyback:200] Out: 2100 [Urine:2100]  Lab Results:  Recent Labs  12/08/13 0609 12/09/13 0230  WBC 6.2 6.7  HGB 7.7* 8.5*  PLT 71* 70*    Recent Labs  12/08/13 0609 12/09/13 0230  NA 132* 133*  K 4.0 3.6*  CL 98 98  CO2 25 26  GLUCOSE 107* 149*  BUN 11 9  CREATININE 0.92 0.96   Hepatic Function Panel  Recent Labs  12/09/13 0230  PROT 5.2*  ALBUMIN 1.6*  AST 29  ALT 17  ALKPHOS 163*  BILITOT 1.8*    Imaging: Dg Orthopantogram  12/08/2013   CLINICAL DATA:  Bacteremia  EXAM: ORTHOPANTOGRAM/PANORAMIC  COMPARISON:  None.  FINDINGS: There appears to be caries involvement of the left upper tooth # 12, i.e. first bicuspid. Also several teeth in the parasymphysis of the mandible are broken at the level of the bone of the mandible i.e. # 23 lateral incisor, # 22 lateral incisor, and #21 first bicuspid. Caries involvement of these teeth cannot be excluded. No fracture of the mandible is seen. The mandibular condyles appear to be normally position. Multiple teeth are missing.  IMPRESSION: Caries involvement as noted above with several teeth which are broken at the level of the bone of the lower mandible.   Electronically Signed   By: Ivar Drape M.D.   On: 12/08/2013 15:51    Telemetry: AFLUTTER 100-110 Personally viewed.   Scheduled Meds: . sodium chloride   Intravenous Once  . cefTRIAXone (ROCEPHIN)  IV  2 g Intravenous Q24H  .  feeding supplement (RESOURCE BREEZE)  1 Container Oral TID BM  . ferrous sulfate  325 mg Oral BID WC  . furosemide  40 mg Intravenous BID  . lactulose  20 g Oral TID  . lidocaine (PF)      . magnesium oxide  400 mg Oral Daily  . metoprolol succinate  12.5 mg Oral BID  . pantoprazole  40 mg Oral Daily  . potassium chloride SA  40 mEq Oral Daily  . rifaximin  550 mg Oral BID  . sodium chloride  3 mL Intravenous Q12H  . spironolactone  100 mg Oral Daily  . ursodiol  300 mg Oral BID  . Vitamin D (Ergocalciferol)  50,000 Units Oral Q Wed   Continuous Infusions: . lactated ringers 10 mL/hr at 12/06/13 1900   PRN Meds:.acetaminophen **OR** acetaminophen, diphenhydrAMINE, HYDROcodone-acetaminophen, HYDROmorphone, menthol-cetylpyridinium **OR** phenol, morphine injection, temazepam   Assessment/Plan:   68 yo with history of cirrhosis from NASH, anemia, thrombocytopenia, ascites, hepatic encephalopathy, and recent Strep viridans PNA was readmitted after a fall and left hip fracture. He was found to be in atrial flutter.  1. Atrial flutter: ECGs show atrial flutter with mild RVR back at least to 11/2. HR currently 100-110 improved from 110s-120s. He is cirrhotic and SBP runs in the 90s range. He is not an anticoagulation candidate: fall risk, anemia, thrombocytopenia, coagulopathy, and h/o  GI bleeding. Dr. Aundra Dubin discussed stroke risk and why he is not a good candidate for anticoagulation.   -Tolerating Toprol XL 25 mg bid  -Could add digoxin if needed.  -Amiodarone would be difficult given his liver disease (and we do not envision cardioverting him to NSR) but if nothing else works we can consider it.   2. Strep viridans bacteremia: Patient has Strep viridans in his blood again. TEE is unreasonable here. He has no history of significant esophageal varices and has had regular EGDs.   -TEE both to look for endocarditis and to assess his LV/RV function (TTE very difficult).Friday.    3. Acute on chronic ?diastolic CHF: Patient is volume overloaded with peripheral edema and elevated JVP. Also with significant ascites.  - Lasix 40 mg IV bid and follow creatinine, I/Os.  - Continue spironolactone - Replete K as needed.  - paracentesis today.  - TEE to assess LV and RV Friday.   4. Cirrhosis   Rache Klimaszewski 12/09/2013, 9:19 AM

## 2013-12-09 NOTE — Progress Notes (Signed)
Current and old chart reviewed.   PROGRESS NOTE  Clifford Cook FAO:[id number] DOB: August 12, 1945 DOA: 12/05/2013 PCP: [name], MD  Assessment/Plan: Hip fracture - status post mechanical fall. S/p pinning.  SCDs only due to coagulopathy, thrombocytopenia and risk for bleed.  Sepsis/bacteremia:  Culture confirms persistent strep viridans.  Patient reports about 5 teeth fell out over the past month or so. No mouth pain. Paracentesis shows <250 PMN, but has been on abx since admission. For TEE tomorrow. Continue rocephin. ID following. Appreciate assistance.  PICC out for line holiday  Atrial fib/flutter:  No mention in previous notes, but EKG from 11/12 and 11/2 shows atrial fib/flutter. Not a candidate for anticoagulation due to coagulopathy and thrombocytopenia and history of GI bleed. HR now about 100. Blood pressure borderline, but asymptomatic. Appreciate cardiology. TEE tomorrow.  NASH cirrhosis: diuretics resumed  Ascites - s/p diagnositic paracentesis with PMN <250  Coagulopathy - Due to liver disease  Anemia - s/p 3 units pRBC. No evidence of ongoing bleeding  Thrombocytopenia - Due to his liver disease  Hyponatremia - Mild, continue to monitor   History of hepatic encephalopathy - continue lactulose. No clinical signs of encephalopathy currently. -added xifaxan  Continue step down unit today.  Code Status: full Family Communication: wife/daughter 11/23 Disposition Plan: home with home health   Consultants:  Ortho  cardiology  Procedures:  Cannulated hip pinning    HPI/Subjective: No complaints  Objective: Filed Vitals:   12/09/13 1242  BP:   Pulse:   Temp: 98.4 F (36.9 C)  Resp:     Intake/Output Summary (Last 24 hours) at 12/09/13 1322 Last data filed at 12/09/13 1243  Gross per 24 hour  Intake    588 ml  Output   2175 ml  Net  -1587 ml   Filed Weights   12/05/13 2218 12/08/13 1337 12/09/13 0500  Weight: 98.2 kg (216 lb 7.9 oz) 102.9  kg (226 lb 13.7 oz) 96.707 kg (213 lb 3.2 oz)   tele: atrial fib rate around 100 Exam:   General:  A+Ox3, NAD  Cardiovascular: irreg irreg no MGR  Respiratory: clear without WRR  Abdomen: +BS, soft, nt, ascites present  Musculoskeletal: 2+ edema, no clubbing or cyanosis  Data Reviewed: Basic Metabolic Panel:  Recent Labs Lab 12/05/13 1233 12/06/13 0630 12/08/13 0609 12/08/13 1615 12/09/13 0230  NA 133* 134* 132*  --  133*  K 4.4 4.3 4.0  --  3.6*  CL 95* 98 98  --  98  CO2 26 26 25   --  26  GLUCOSE 146* 75 107*  --  149*  BUN 14 14 11   --  9  CREATININE 1.16 1.10 0.92  --  0.96  CALCIUM 8.1* 7.7* 7.5*  --  7.5*  MG  --   --   --  1.8  --    Liver Function Tests:  Recent Labs Lab 12/05/13 1233 12/06/13 0630 12/09/13 0230  AST 40* 37 29  ALT 34 31 17  ALKPHOS 166* 156* 163*  BILITOT 2.3* 2.2* 1.8*  PROT 5.5* 5.2* 5.2*  ALBUMIN 1.8* 1.7* 1.6*   No results for input(s): LIPASE, AMYLASE in the last 168 hours.  Recent Labs Lab 12/05/13 1708  AMMONIA 95*   CBC:  Recent Labs Lab 12/05/13 1233 12/06/13 0630 12/06/13 1950 12/08/13 0609 12/09/13 0230  WBC 6.3 6.1 6.7 6.2 6.7  NEUTROABS 4.4  --   --   --   --   HGB 8.1* 7.8* 8.1*  7.7* 8.5*  HCT 23.9* 23.3* 24.3* 23.0* 25.5*  MCV 94.8 92.1 92.7 93.1 92.4  PLT PLATELET CLUMPS NOTED ON SMEAR, COUNT APPEARS DECREASED 80* 72* 71* 70*   Cardiac Enzymes:  Recent Labs Lab 12/05/13 1233  TROPONINI <0.30   BNP (last 3 results)  Recent Labs  11/15/13 1237  PROBNP 864.3*   CBG: No results for input(s): GLUCAP in the last 168 hours.  Recent Results (from the past 240 hour(s))  Culture, blood (routine x 2)     Status: None (Preliminary result)   Collection Time: 12/05/13  5:08 PM  Result Value Ref Range Status   Specimen Description BLOOD LEFT HAND  4 ML IN [facility] BOTTLE  Final   Special Requests NONE  Final   Culture  Setup Time   Final    12/05/2013 21:36 Performed at [facility]     Culture   Final           BLOOD CULTURE RECEIVED NO GROWTH TO DATE CULTURE WILL BE HELD FOR 5 DAYS BEFORE ISSUING A FINAL NEGATIVE REPORT Performed at [facility]    Report Status PENDING  Incomplete  Culture, blood (routine x 2)     Status: None   Collection Time: 12/05/13  5:11 PM  Result Value Ref Range Status   Specimen Description BLOOD PICC  5 ML IN [facility] BOTTLE  Final   Special Requests NONE  Final   Culture  Setup Time   Final    12/05/2013 21:36 Performed at [facility]    Culture   Final    VIRIDANS STREPTOCOCCUS Note: THE SIGNIFICANCE OF ISOLATING THIS ORGANISM FROM A SINGLE SET OF BLOOD CULTURES WHEN MULTIPLE SETS ARE DRAWN IS UNCERTAIN. PLEASE NOTIFY THE MICROBIOLOGY DEPARTMENT WITHIN ONE WEEK IF SPECIATION AND SENSITIVITIES ARE REQUIRED. Note: Gram Stain Report Called to,Read Back By and Verified With: KENYAN USSERY ON 12/06/2013 AT 9:36P BY Dennard Nip Performed at [facility]    Report Status 12/08/2013 FINAL  Final  MRSA PCR Screening     Status: None   Collection Time: 12/06/13 12:48 AM  Result Value Ref Range Status   MRSA by PCR NEGATIVE NEGATIVE Final    Comment:        The GeneXpert MRSA Assay (FDA approved for NASAL specimens only), is one component of a comprehensive MRSA colonization surveillance program. It is not intended to diagnose MRSA infection nor to guide or monitor treatment for MRSA infections.   Culture, blood (routine x 2)     Status: None (Preliminary result)   Collection Time: 12/08/13  2:53 PM  Result Value Ref Range Status   Specimen Description BLOOD RIGHT HAND  Final   Special Requests BOTTLES DRAWN AEROBIC AND ANAEROBIC 10CC  Final   Culture  Setup Time   Final    12/08/2013 21:50 Performed at [facility]    Culture   Final           BLOOD CULTURE RECEIVED NO GROWTH TO DATE CULTURE WILL BE HELD FOR 5 DAYS BEFORE ISSUING A FINAL NEGATIVE REPORT Performed at [facility]    Report Status  PENDING  Incomplete     Studies: Dg Orthopantogram  12/08/2013   CLINICAL DATA:  Bacteremia  EXAM: ORTHOPANTOGRAM/PANORAMIC  COMPARISON:  None.  FINDINGS: There appears to be caries involvement of the left upper tooth # 12, i.e. first bicuspid. Also several teeth in the parasymphysis of the mandible are broken at the level of the  bone of the mandible i.e. # 23 lateral incisor, # 22 lateral incisor, and #21 first bicuspid. Caries involvement of these teeth cannot be excluded. No fracture of the mandible is seen. The mandibular condyles appear to be normally position. Multiple teeth are missing.  IMPRESSION: Caries involvement as noted above with several teeth which are broken at the level of the bone of the lower mandible.   Electronically Signed   By: Ivar Drape M.D.   On: 12/08/2013 15:51   US Paracentesis  12/09/2013   CLINICAL DATA:  Request for paracentesis, diagnostic only.  EXAM: ULTRASOUND GUIDED PARACENTESIS  COMPARISON:  Previous paracentesis  PROCEDURE: An ultrasound guided paracentesis was thoroughly discussed with the patient and questions answered. The benefits, risks, alternatives and complications were also discussed. The patient understands and wishes to proceed with the procedure. Written consent was obtained.  Ultrasound was performed to localize and mark an adequate pocket of fluid in the right lower quadrant of the abdomen. The area was then prepped and draped in the normal sterile fashion. 1% Lidocaine was used for local anesthesia. Under ultrasound guidance a 19 gauge Yueh catheter was introduced. Paracentesis was performed. The catheter was removed and a dressing applied.  COMPLICATIONS: None immediate  FINDINGS: Moderate volume of ascites is present.  A total of approximately 120 mL of clear yellow fluid was removed. A fluid sample was sent for laboratory analysis.  IMPRESSION: Successful ultrasound guided paracentesis yielding 120 mL of ascites.  Read by: Ascencion Dike PA-C    Electronically Signed   By: Daryll Brod M.D.   On: 12/09/2013 10:27    Scheduled Meds: . sodium chloride   Intravenous Once  . cefTRIAXone (ROCEPHIN)  IV  2 g Intravenous Q24H  . feeding supplement (RESOURCE BREEZE)  1 Container Oral TID BM  . ferrous sulfate  325 mg Oral BID WC  . furosemide  40 mg Intravenous BID  . lactulose  20 g Oral TID  . lidocaine (PF)      . magnesium oxide  400 mg Oral Daily  . metoprolol succinate  12.5 mg Oral BID  . pantoprazole  40 mg Oral Daily  . potassium chloride SA  40 mEq Oral Daily  . rifaximin  550 mg Oral BID  . sodium chloride  3 mL Intravenous Q12H  . spironolactone  100 mg Oral Daily  . ursodiol  300 mg Oral BID  . Vitamin D (Ergocalciferol)  50,000 Units Oral Q Wed   Continuous Infusions: . lactated ringers 10 mL/hr at 12/06/13 1900   Antibiotics Given (last 72 hours)    Date/Time Action Medication Dose Rate   12/06/13 1651 Given   ceFAZolin (ANCEF) IVPB 2 g/50 mL premix 2 g    12/06/13 1850 Given   imipenem-cilastatin (PRIMAXIN) 500 mg in sodium chloride 0.9 % 100 mL IVPB 500 mg 200 mL/hr   12/06/13 1951 Given   vancomycin (VANCOCIN) IVPB 1000 mg/200 mL premix 1,000 mg 200 mL/hr   12/06/13 2201 Given   rifaximin (XIFAXAN) tablet 550 mg 550 mg    12/06/13 2202 Given   ceFAZolin (ANCEF) IVPB 2 g/50 mL premix 2 g 100 mL/hr   12/07/13 0100 Given   imipenem-cilastatin (PRIMAXIN) 500 mg in sodium chloride 0.9 % 100 mL IVPB 500 mg 200 mL/hr   12/07/13 1610 Given   ceFAZolin (ANCEF) IVPB 2 g/50 mL premix 2 g 100 mL/hr   12/07/13 0557 Given   imipenem-cilastatin (PRIMAXIN) 500 mg in sodium chloride 0.9 % 100  mL IVPB 500 mg 200 mL/hr   12/07/13 0900 Given   vancomycin (VANCOCIN) IVPB 1000 mg/200 mL premix 1,000 mg 200 mL/hr   12/07/13 0910 Given   rifaximin (XIFAXAN) tablet 550 mg 550 mg    12/07/13 1300 Given   imipenem-cilastatin (PRIMAXIN) 500 mg in sodium chloride 0.9 % 100 mL IVPB 500 mg 200 mL/hr   12/07/13 1800 Given    imipenem-cilastatin (PRIMAXIN) 500 mg in sodium chloride 0.9 % 100 mL IVPB 500 mg 200 mL/hr   12/07/13 1936 Given   vancomycin (VANCOCIN) IVPB 1000 mg/200 mL premix 1,000 mg 200 mL/hr   12/07/13 2125 Given   rifaximin (XIFAXAN) tablet 550 mg 550 mg    12/08/13 0003 Given   imipenem-cilastatin (PRIMAXIN) 500 mg in sodium chloride 0.9 % 100 mL IVPB 500 mg 200 mL/hr   12/08/13 0531 Given   imipenem-cilastatin (PRIMAXIN) 500 mg in sodium chloride 0.9 % 100 mL IVPB 500 mg 200 mL/hr   12/08/13 0748 Given   vancomycin (VANCOCIN) IVPB 750 mg/150 ml premix 750 mg 150 mL/hr   12/08/13 0957 Given   rifaximin (XIFAXAN) tablet 550 mg 550 mg    12/08/13 1345 Given   cefTRIAXone (ROCEPHIN) 1 g in dextrose 5 % 50 mL IVPB - Premix 1 g 100 mL/hr   12/08/13 2142 Given   rifaximin (XIFAXAN) tablet 550 mg 550 mg    12/09/13 0945 Given   rifaximin (XIFAXAN) tablet 550 mg 550 mg      Time spent: 25 min  Lashaya Kienitz L  [facility] Contact:  [email or web address], password [facility] 12/09/2013, 1:22 PM  LOS: 4 days

## 2013-12-09 NOTE — Progress Notes (Signed)
Evening dose of lasix held due to blood pressure at 2000, BP was 82/50, HR 105.  Toprol 12.5mg  given at 2200 when BP was 98/54, HR 110.  Earlier in the day Lasix was given and Toprol was held due to blood pressure issues.  Noted drop in blood pressure after receiving lasix previously.  Lungs are clear and oxygen saturation is 98% on 2L.  I will continue to monitor.

## 2013-12-10 LAB — CBC
HCT: 27 % — ABNORMAL LOW (ref 39.0–52.0)
Hemoglobin: 8.9 g/dL — ABNORMAL LOW (ref 13.0–17.0)
MCH: 30.9 pg (ref 26.0–34.0)
MCHC: 33 g/dL (ref 30.0–36.0)
MCV: 93.8 fL (ref 78.0–100.0)
PLATELETS: 64 10*3/uL — AB (ref 150–400)
RBC: 2.88 MIL/uL — ABNORMAL LOW (ref 4.22–5.81)
RDW: 19.4 % — ABNORMAL HIGH (ref 11.5–15.5)
WBC: 6.7 10*3/uL (ref 4.0–10.5)

## 2013-12-10 LAB — TYPE AND SCREEN
ABO/RH(D): O NEG
Antibody Screen: NEGATIVE
UNIT DIVISION: 0
Unit division: 0

## 2013-12-10 LAB — BASIC METABOLIC PANEL
Anion gap: 9 (ref 5–15)
BUN: 10 mg/dL (ref 6–23)
CO2: 25 mEq/L (ref 19–32)
CREATININE: 0.9 mg/dL (ref 0.50–1.35)
Calcium: 7.6 mg/dL — ABNORMAL LOW (ref 8.4–10.5)
Chloride: 101 mEq/L (ref 96–112)
GFR calc non Af Amer: 85 mL/min — ABNORMAL LOW (ref >90)
Glucose, Bld: 101 mg/dL — ABNORMAL HIGH (ref 70–99)
Potassium: 4.7 mEq/L (ref 3.7–5.3)
Sodium: 135 mEq/L — ABNORMAL LOW (ref 137–147)

## 2013-12-10 LAB — PATHOLOGIST SMEAR REVIEW: PATH REVIEW: REACTIVE

## 2013-12-10 MED ORDER — DIPHENHYDRAMINE HCL 25 MG PO CAPS
25.0000 mg | ORAL_CAPSULE | Freq: Three times a day (TID) | ORAL | Status: DC | PRN
  Administered 2013-12-10 – 2013-12-12 (×3): 25 mg via ORAL
  Filled 2013-12-10 (×3): qty 1

## 2013-12-10 MED ORDER — SODIUM CHLORIDE 0.9 % IV SOLN: INTRAVENOUS | Status: DC

## 2013-12-10 NOTE — Progress Notes (Signed)
Physical Therapy Treatment Patient Details Name: Clifford Cook MRN: [id number] DOB: 09-29-1945 Today's Date: 12/10/2013    History of Present Illness pt presents post fall with L hip fx post cannulated pinning.  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    PT Comments    Progressing with ambulation despite O2 sat decline.  Able to bear most of weight through left LE without c/o severe pain.  Will need HHPT and family assist at d/c.   Follow Up Recommendations  Home health PT;Supervision/Assistance - 24 hour     Equipment Recommendations  None recommended by PT    Recommendations for Other Services       Precautions / Restrictions Precautions Precautions: Fall Restrictions LLE Weight Bearing: Weight bearing as tolerated    Mobility  Bed Mobility Overal bed mobility: Needs Assistance       Supine to sit: Min assist Sit to supine: Min guard   General bed mobility comments: assist with left LE  Transfers Overall transfer level: Needs assistance Equipment used: Pushed w/c Transfers: Sit to/from Stand Sit to Stand: Min assist         General transfer comment: cues for hand placement  Ambulation/Gait Ambulation/Gait assistance: Min guard Ambulation Distance (Feet): 150 Feet Assistive device:  (pushed wheelchair) Gait Pattern/deviations: Step-through pattern;Antalgic     General Gait Details: only mild antalgia and c/o pain despite not being able to use wheelchair to take weight off sore leg; on 3L O2 with ambulation and SpO2 down to 80%, stopped and cues for breathing up to 91%   Stairs            Wheelchair Mobility    Modified Rankin (Stroke Patients Only)       Balance Overall balance assessment: Needs assistance   Sitting balance-Leahy Scale: Fair Sitting balance - Comments: UE support needed for safety     Standing balance-Leahy Scale: Poor                      Cognition Arousal/Alertness:  Awake/alert Behavior During Therapy: WFL for tasks assessed/performed Overall Cognitive Status: Within Functional Limits for tasks assessed                      Exercises Total Joint Exercises Ankle Circles/Pumps: AROM;10 reps;Both;Supine Short Arc Quad: AROM;Left;10 reps;Supine Heel Slides: AROM;Both;10 reps;Supine Knee Flexion: AROM    General Comments        Pertinent Vitals/Pain Pain Score: 4  Pain Location: left hip with weight bearing Pain Descriptors / Indicators: Sore Pain Intervention(s): RN gave pain meds during session;Repositioned    Home Living                      Prior Function            PT Goals (current goals can now be found in the care plan section) Progress towards PT goals: Progressing toward goals    Frequency  Min 5X/week    PT Plan Current plan remains appropriate    Co-evaluation             End of Session Equipment Utilized During Treatment: Gait belt;Oxygen Activity Tolerance: Patient tolerated treatment well Patient left: in bed;with call bell/phone within reach     Time: 4401-0272 PT Time Calculation (min) (ACUTE ONLY): 26 min  Charges:  $Gait Training: 8-22 mins $Therapeutic Exercise: 8-22 mins  G Codes:      Clifford Cook,Clifford Cook 12/10/2013, 1:29 PM Magda Kiel, [place] 12/10/2013

## 2013-12-10 NOTE — Progress Notes (Signed)
PROGRESS NOTE  Clifford Cook KVQ:[id number] DOB: 1945-02-14 DOA: 12/05/2013 PCP: [name], MD  Assessment/Plan: Hip fracture - status post mechanical fall. S/p pinning.  SCDs only due to coagulopathy, thrombocytopenia and risk for bleed.  Sepsis/bacteremia:  Culture confirms persistent strep viridans.  Patient reports about 5 teeth fell out over the past month or so. No mouth pain. Paracentesis shows <250 PMN, but has been on abx since admission. For TEE Monday. Continue rocephin. ID following. Appreciate assistance.  Ok to replace picc per ID  Atrial fib/flutter:  No mention in previous notes, but EKG from 11/12 and 11/2 shows atrial fib/flutter. Not a candidate for anticoagulation due to coagulopathy and thrombocytopenia and history of GI bleed. HR now about 100. Blood pressure borderline, but asymptomatic. Appreciate cardiology. TEE monday.  NASH cirrhosis: diuretics resumed  Ascites - s/p diagnositic paracentesis with PMN <250  Coagulopathy - Due to liver disease  Anemia - s/p 3 units pRBC. No evidence of ongoing bleeding  Thrombocytopenia - Due to his liver disease  Hyponatremia - Mild, continue to monitor   History of hepatic encephalopathy - continue lactulose. No clinical signs of encephalopathy currently. -added xifaxan  Chronically low blood pressure. Ambulated with PT down the hall. Asymptomatic. Transfer to tele  Code Status: full Family Communication: at bedside Disposition Plan: home with home health   Consultants:  Ortho  cardiology  Procedures:  Cannulated hip pinning    HPI/Subjective: No complaints  Objective: Filed Vitals:   12/10/13 1500  BP: 90/53  Pulse: 109  Temp: 97.8 F (36.6 C)  Resp: 26    Intake/Output Summary (Last 24 hours) at 12/10/13 1701 Last data filed at 12/10/13 1500  Gross per 24 hour  Intake 513.33 ml  Output   1150 ml  Net -636.67 ml   Filed Weights   12/05/13 2218 12/08/13 1337 12/09/13 0500  Weight:  98.2 kg (216 lb 7.9 oz) 102.9 kg (226 lb 13.7 oz) 96.707 kg (213 lb 3.2 oz)   tele: atrial fib rate around 100 Exam:   General:  A+Ox3, NAD  Cardiovascular: irreg irreg no MGR  Respiratory: clear without WRR  Abdomen: +BS, soft, nt, ascites present  Musculoskeletal: 1+ edema, no clubbing or cyanosis  Data Reviewed: Basic Metabolic Panel:  Recent Labs Lab 12/05/13 1233 12/06/13 0630 12/08/13 0609 12/08/13 1615 12/09/13 0230 12/10/13 0235  NA 133* 134* 132*  --  133* 135*  K 4.4 4.3 4.0  --  3.6* 4.7  CL 95* 98 98  --  98 101  CO2 26 26 25   --  26 25  GLUCOSE 146* 75 107*  --  149* 101*  BUN 14 14 11   --  9 10  CREATININE 1.16 1.10 0.92  --  0.96 0.90  CALCIUM 8.1* 7.7* 7.5*  --  7.5* 7.6*  MG  --   --   --  1.8  --   --    Liver Function Tests:  Recent Labs Lab 12/05/13 1233 12/06/13 0630 12/09/13 0230  AST 40* 37 29  ALT 34 31 17  ALKPHOS 166* 156* 163*  BILITOT 2.3* 2.2* 1.8*  PROT 5.5* 5.2* 5.2*  ALBUMIN 1.8* 1.7* 1.6*   No results for input(s): LIPASE, AMYLASE in the last 168 hours.  Recent Labs Lab 12/05/13 1708  AMMONIA 95*   CBC:  Recent Labs Lab 12/05/13 1233 12/06/13 0630 12/06/13 1950 12/08/13 0609 12/09/13 0230 12/10/13 0235  WBC 6.3 6.1 6.7 6.2 6.7 6.7  NEUTROABS 4.4  --   --   --   --   --  HGB 8.1* 7.8* 8.1* 7.7* 8.5* 8.9*  HCT 23.9* 23.3* 24.3* 23.0* 25.5* 27.0*  MCV 94.8 92.1 92.7 93.1 92.4 93.8  PLT PLATELET CLUMPS NOTED ON SMEAR, COUNT APPEARS DECREASED 80* 72* 71* 70* 64*   Cardiac Enzymes:  Recent Labs Lab 12/05/13 1233  TROPONINI <0.30   BNP (last 3 results)  Recent Labs  11/15/13 1237  PROBNP 864.3*   CBG: No results for input(s): GLUCAP in the last 168 hours.  Recent Results (from the past 240 hour(s))  Culture, blood (routine x 2)     Status: None (Preliminary result)   Collection Time: 12/05/13  5:08 PM  Result Value Ref Range Status   Specimen Description BLOOD LEFT HAND  4 ML IN [facility] BOTTLE   Final   Special Requests NONE  Final   Culture  Setup Time   Final    12/05/2013 21:36 Performed at [facility]    Culture   Final           BLOOD CULTURE RECEIVED NO GROWTH TO DATE CULTURE WILL BE HELD FOR 5 DAYS BEFORE ISSUING A FINAL NEGATIVE REPORT Performed at [facility]    Report Status PENDING  Incomplete  Culture, blood (routine x 2)     Status: None   Collection Time: 12/05/13  5:11 PM  Result Value Ref Range Status   Specimen Description BLOOD PICC  5 ML IN [facility] BOTTLE  Final   Special Requests NONE  Final   Culture  Setup Time   Final    12/05/2013 21:36 Performed at [facility]    Culture   Final    VIRIDANS STREPTOCOCCUS Note: THE SIGNIFICANCE OF ISOLATING THIS ORGANISM FROM A SINGLE SET OF BLOOD CULTURES WHEN MULTIPLE SETS ARE DRAWN IS UNCERTAIN. PLEASE NOTIFY THE MICROBIOLOGY DEPARTMENT WITHIN ONE WEEK IF SPECIATION AND SENSITIVITIES ARE REQUIRED. Note: Gram Stain Report Called to,Read Back By and Verified With: KENYAN USSERY ON 12/06/2013 AT 9:36P BY Dennard Nip Performed at [facility]    Report Status 12/08/2013 FINAL  Final  MRSA PCR Screening     Status: None   Collection Time: 12/06/13 12:48 AM  Result Value Ref Range Status   MRSA by PCR NEGATIVE NEGATIVE Final    Comment:        The GeneXpert MRSA Assay (FDA approved for NASAL specimens only), is one component of a comprehensive MRSA colonization surveillance program. It is not intended to diagnose MRSA infection nor to guide or monitor treatment for MRSA infections.   Culture, blood (routine x 2)     Status: None (Preliminary result)   Collection Time: 12/08/13  2:53 PM  Result Value Ref Range Status   Specimen Description BLOOD RIGHT HAND  Final   Special Requests BOTTLES DRAWN AEROBIC AND ANAEROBIC 10CC  Final   Culture  Setup Time   Final    12/08/2013 21:50 Performed at [facility]    Culture   Final           BLOOD CULTURE RECEIVED NO GROWTH TO  DATE CULTURE WILL BE HELD FOR 5 DAYS BEFORE ISSUING A FINAL NEGATIVE REPORT Performed at [facility]    Report Status PENDING  Incomplete  Culture, blood (routine x 2)     Status: None (Preliminary result)   Collection Time: 12/08/13 11:00 PM  Result Value Ref Range Status   Specimen Description BLOOD RIGHT ARM  Final   Special Requests BOTTLES DRAWN AEROBIC AND ANAEROBIC 10CC  Final  Culture  Setup Time   Final    12/09/2013 04:48 Performed at [facility]    Culture   Final           BLOOD CULTURE RECEIVED NO GROWTH TO DATE CULTURE WILL BE HELD FOR 5 DAYS BEFORE ISSUING A FINAL NEGATIVE REPORT Performed at [facility]    Report Status PENDING  Incomplete  Body fluid culture     Status: None (Preliminary result)   Collection Time: 12/09/13  9:18 AM  Result Value Ref Range Status   Specimen Description FLUID ASCITES  Final   Special Requests NONE  Final   Gram Stain   Final    NO WBC SEEN NO ORGANISMS SEEN Performed at [facility]    Culture   Final    NO GROWTH 1 DAY Performed at [facility]    Report Status PENDING  Incomplete  Culture, blood (routine x 2)     Status: None (Preliminary result)   Collection Time: 12/09/13 10:36 AM  Result Value Ref Range Status   Specimen Description BLOOD RIGHT WRIST  Final   Special Requests BOTTLES DRAWN AEROBIC AND ANAEROBIC 5CC  Final   Culture  Setup Time   Final    12/09/2013 14:04 Performed at [facility]    Culture   Final           BLOOD CULTURE RECEIVED NO GROWTH TO DATE CULTURE WILL BE HELD FOR 5 DAYS BEFORE ISSUING A FINAL NEGATIVE REPORT Performed at [facility]    Report Status PENDING  Incomplete  Culture, blood (routine x 2)     Status: None (Preliminary result)   Collection Time: 12/09/13 10:41 AM  Result Value Ref Range Status   Specimen Description BLOOD LEFT WRIST  Final   Special Requests BOTTLES DRAWN AEROBIC AND ANAEROBIC 5CC  Final   Culture   Setup Time   Final    12/09/2013 14:05 Performed at [facility]    Culture   Final           BLOOD CULTURE RECEIVED NO GROWTH TO DATE CULTURE WILL BE HELD FOR 5 DAYS BEFORE ISSUING A FINAL NEGATIVE REPORT Performed at [facility]    Report Status PENDING  Incomplete     Studies: US Paracentesis  12/09/2013   CLINICAL DATA:  Request for paracentesis, diagnostic only.  EXAM: ULTRASOUND GUIDED PARACENTESIS  COMPARISON:  Previous paracentesis  PROCEDURE: An ultrasound guided paracentesis was thoroughly discussed with the patient and questions answered. The benefits, risks, alternatives and complications were also discussed. The patient understands and wishes to proceed with the procedure. Written consent was obtained.  Ultrasound was performed to localize and mark an adequate pocket of fluid in the right lower quadrant of the abdomen. The area was then prepped and draped in the normal sterile fashion. 1% Lidocaine was used for local anesthesia. Under ultrasound guidance a 19 gauge Yueh catheter was introduced. Paracentesis was performed. The catheter was removed and a dressing applied.  COMPLICATIONS: None immediate  FINDINGS: Moderate volume of ascites is present.  A total of approximately 120 mL of clear yellow fluid was removed. A fluid sample was sent for laboratory analysis.  IMPRESSION: Successful ultrasound guided paracentesis yielding 120 mL of ascites.  Read by: Ascencion Dike PA-C   Electronically Signed   By: Daryll Brod M.D.   On: 12/09/2013 10:27    Scheduled Meds: . sodium chloride   Intravenous Once  . cefTRIAXone (  ROCEPHIN)  IV  2 g Intravenous Q24H  . feeding supplement (RESOURCE BREEZE)  1 Container Oral TID BM  . ferrous sulfate  325 mg Oral BID WC  . furosemide  40 mg Intravenous BID  . lactulose  20 g Oral TID  . magnesium oxide  400 mg Oral Daily  . metoprolol succinate  12.5 mg Oral BID  . pantoprazole  40 mg Oral Daily  . potassium chloride SA  40 mEq  Oral BID  . rifaximin  550 mg Oral BID  . sodium chloride  3 mL Intravenous Q12H  . spironolactone  100 mg Oral Daily  . ursodiol  300 mg Oral BID  . Vitamin D (Ergocalciferol)  50,000 Units Oral Q Wed   Continuous Infusions: . lactated ringers 10 mL/hr at 12/06/13 1900   Antibiotics Given (last 72 hours)    Date/Time Action Medication Dose Rate   12/07/13 1800 Given   imipenem-cilastatin (PRIMAXIN) 500 mg in sodium chloride 0.9 % 100 mL IVPB 500 mg 200 mL/hr   12/07/13 1936 Given   vancomycin (VANCOCIN) IVPB 1000 mg/200 mL premix 1,000 mg 200 mL/hr   12/07/13 2125 Given   rifaximin (XIFAXAN) tablet 550 mg 550 mg    12/08/13 0003 Given   imipenem-cilastatin (PRIMAXIN) 500 mg in sodium chloride 0.9 % 100 mL IVPB 500 mg 200 mL/hr   12/08/13 0531 Given   imipenem-cilastatin (PRIMAXIN) 500 mg in sodium chloride 0.9 % 100 mL IVPB 500 mg 200 mL/hr   12/08/13 0748 Given   vancomycin (VANCOCIN) IVPB 750 mg/150 ml premix 750 mg 150 mL/hr   12/08/13 0957 Given   rifaximin (XIFAXAN) tablet 550 mg 550 mg    12/08/13 1345 Given   cefTRIAXone (ROCEPHIN) 1 g in dextrose 5 % 50 mL IVPB - Premix 1 g 100 mL/hr   12/08/13 2142 Given   rifaximin (XIFAXAN) tablet 550 mg 550 mg    12/09/13 0945 Given   rifaximin (XIFAXAN) tablet 550 mg 550 mg    12/09/13 1459 Given   cefTRIAXone (ROCEPHIN) 2 g in dextrose 5 % 50 mL IVPB - Premix 2 g 100 mL/hr   12/09/13 2226 Given   rifaximin (XIFAXAN) tablet 550 mg 550 mg    12/10/13 1057 Given   rifaximin (XIFAXAN) tablet 550 mg 550 mg    12/10/13 1319 Given   cefTRIAXone (ROCEPHIN) 2 g in dextrose 5 % 50 mL IVPB - Premix 2 g 100 mL/hr     Time spent: 25 min  Addalynn Kumari L  [facility] Contact:  [email or web address], password [facility] 12/10/2013, 5:01 PM  LOS: 5 days

## 2013-12-10 NOTE — Progress Notes (Signed)
Patient Name: Clifford Cook Date of Encounter: 12/10/2013     Principal Problem:   Hip fracture Active Problems:   H/O: GI bleed   Liver cirrhosis secondary to NASH   Ascites   Generalized weakness   Hyponatremia   Anemia   Thrombocytopenia   Coagulopathy   SIRS (systemic inflammatory response syndrome)   Lactic acidosis   Atrial flutter   Bacteremia   Bacteremia due to Streptococcus   Encephalopathy, hepatic    SUBJECTIVE  Pt has a hx of bacteremia.  He is scheduled for a TEE to evaluate for endocarditis.  Denies any CP or SOB. Ambulated with PT this morning.   CURRENT MEDS . sodium chloride   Intravenous Once  . cefTRIAXone (ROCEPHIN)  IV  2 g Intravenous Q24H  . feeding supplement (RESOURCE BREEZE)  1 Container Oral TID BM  . ferrous sulfate  325 mg Oral BID WC  . furosemide  40 mg Intravenous BID  . lactulose  20 g Oral TID  . magnesium oxide  400 mg Oral Daily  . metoprolol succinate  12.5 mg Oral BID  . pantoprazole  40 mg Oral Daily  . potassium chloride SA  40 mEq Oral BID  . rifaximin  550 mg Oral BID  . sodium chloride  3 mL Intravenous Q12H  . spironolactone  100 mg Oral Daily  . ursodiol  300 mg Oral BID  . Vitamin D (Ergocalciferol)  50,000 Units Oral Q Wed    OBJECTIVE  Filed Vitals:   12/09/13 2300 12/10/13 0000 12/10/13 0431 12/10/13 0700  BP: 92/50 94/51 98/58  102/62  Pulse: 104 92 94 85  Temp:  99 F (37.2 C) 98.6 F (37 C) 97.8 F (36.6 C)  TempSrc:  Oral Oral Oral  Resp: 16 15 12 13   Height:      Weight:      SpO2: 96% 96% 92% 99%    Intake/Output Summary (Last 24 hours) at 12/10/13 1132 Last data filed at 12/10/13 0900  Gross per 24 hour  Intake 1043.33 ml  Output    975 ml  Net  68.33 ml   Filed Weights   12/05/13 2218 12/08/13 1337 12/09/13 0500  Weight: 216 lb 7.9 oz (98.2 kg) 226 lb 13.7 oz (102.9 kg) 213 lb 3.2 oz (96.707 kg)    PHYSICAL EXAM  General: Pleasant, NAD. Neuro: Alert and oriented X 3. Moves all  extremities spontaneously. Psych: Normal affect. HEENT:  Normal  Neck: Supple without bruits. + JVD more noticeable on L Lungs:  Resp regular and unlabored, on [place], rale in R base, markedly decreased breath sound in L base Heart:  Mildly tachycardic, irregular, no s3, s4, or murmurs. Abdomen: Soft, non-tender, non-distended, BS + x 4.  Extremities: No clubbing, cyanosis or edema. DP/PT/Radials 2+ and equal bilaterally. No obvious edema seen today.  Accessory Clinical Findings  CBC  Recent Labs  12/09/13 0230 12/10/13 0235  WBC 6.7 6.7  HGB 8.5* 8.9*  HCT 25.5* 27.0*  MCV 92.4 93.8  PLT 70* 64*   Basic Metabolic Panel  Recent Labs  12/08/13 1615 12/09/13 0230 12/10/13 0235  NA  --  133* 135*  K  --  3.6* 4.7  CL  --  98 101  CO2  --  26 25  GLUCOSE  --  149* 101*  BUN  --  9 10  CREATININE  --  0.96 0.90  CALCIUM  --  7.5* 7.6*  MG 1.8  --   --  Liver Function Tests  Recent Labs  12/09/13 0230  AST 29  ALT 17  ALKPHOS 163*  BILITOT 1.8*  PROT 5.2*  ALBUMIN 1.6*   Thyroid Function Tests  Recent Labs  12/08/13 1000  TSH 4.230    TELE A-flutter/a-fib with HR 90s to low 100    ECG  No new EKG  Echocardiogram 11/03/2013  Study Conclusions  - Impressions: Images are obtained with contrast to further assess LV wall motion. Unfortunately, these images are also very limited. I can not add anything to the reading of the study of 11/01/2013. There is overall good LV function. Wall motion abnormalities can not be ruled out.  Impressions:  - Images are obtained with contrast to further assess LV wall motion. Unfortunately, these images are also very limited. I can not add anything to the reading of the study of 11/01/2013. There is overall good LV function. Wall motion abnormalities can not be ruled out.    Radiology/Studies  Dg Orthopantogram  12/08/2013   CLINICAL DATA:  Bacteremia  EXAM: ORTHOPANTOGRAM/PANORAMIC   COMPARISON:  None.  FINDINGS: There appears to be caries involvement of the left upper tooth # 12, i.e. first bicuspid. Also several teeth in the parasymphysis of the mandible are broken at the level of the bone of the mandible i.e. # 23 lateral incisor, # 22 lateral incisor, and #21 first bicuspid. Caries involvement of these teeth cannot be excluded. No fracture of the mandible is seen. The mandibular condyles appear to be normally position. Multiple teeth are missing.  IMPRESSION: Caries involvement as noted above with several teeth which are broken at the level of the bone of the lower mandible.   Electronically Signed   By: Ivar Drape M.D.   On: 12/08/2013 15:51   Dg Chest 2 View  11/15/2013   CLINICAL DATA:  Dyspnea ; bilateral lower extremity edema  EXAM: CHEST  2 VIEW  COMPARISON:  November 04, 2013  FINDINGS: There has been marked interval resolution of pulmonary edema. Currently there is evidence of underlying emphysema with scattered areas of scarring bilaterally. There is currently trace interstitial edema and no consolidation. Heart is upper normal in size. The pulmonary vascularity reflects underlying emphysema. There is a minimal right effusion. No adenopathy. Central catheter tip in superior vena cava. No pneumothorax.  IMPRESSION: Significant interval resolution of pulmonary edema compared to prior study. Currently there is trace edema with heart upper normal in size and minimal right effusion. There is felt to be mild congestive heart failure superimposed on emphysema currently. No consolidation. Central catheter tip in superior vena cava. No pneumothorax.   Electronically Signed   By: Lowella Grip M.D.   On: 11/15/2013 13:29   Dg Hip Complete Left  12/05/2013   CLINICAL DATA:  Fall 2 days ago on left side. Left posterior left upper leg pain radiating to groin area. Bruising. Initial encounter.  EXAM: LEFT HIP - COMPLETE 2+ VIEW  COMPARISON:  None.  FINDINGS: There is a left femoral  neck fracture. Mild valgus angulation. No subluxation or dislocation. Mild degenerative changes in the hips bilaterally.  IMPRESSION: Left femoral neck fracture with mild valgus angulation.   Electronically Signed   By: Rolm Baptise M.D.   On: 12/05/2013 14:11   Dg Hip Operative Left  12/06/2013   CLINICAL DATA:  Left femoral neck fracture.  EXAM: OPERATIVE LEFT HIP  COMPARISON:  Radiographs dated 12/05/2013  FINDINGS: AP and lateral C-arm images demonstrate 3 pins have been inserted across  the left femoral neck fracture. Alignment and position of the fracture fragments is near anatomic.  IMPRESSION: Open reduction and internal fixation of left femoral neck fracture.   Electronically Signed   By: Rozetta Nunnery M.D.   On: 12/06/2013 18:07   Dg Femur Left  12/05/2013   CLINICAL DATA:  Fall onto left side.  Left hip and groin pain.  EXAM: LEFT FEMUR - 2 VIEW  COMPARISON:  Left hip series performed today.  FINDINGS: There is a left femoral neck fracture. Mild valgus angulation. No subluxation or dislocation. No additional femoral abnormality.  IMPRESSION: Left femoral neck fracture with mild valgus angulation.   Electronically Signed   By: Rolm Baptise M.D.   On: 12/05/2013 14:11   Pelvis Portable  12/06/2013   CLINICAL DATA:  Status post cannulated hip pinning  EXAM: PORTABLE PELVIS 1-2 VIEWS  COMPARISON:  12/05/2013  FINDINGS: Three Knowles pins have then used to reduce the left subcapital femoral neck fracture. The fracture fragments are in anatomic alignment. There is no complication identified.  IMPRESSION: 1. Status post ORIF of left hip fracture.   Electronically Signed   By: Kerby Moors M.D.   On: 12/06/2013 23:13   US Paracentesis  12/09/2013   CLINICAL DATA:  Request for paracentesis, diagnostic only.  EXAM: ULTRASOUND GUIDED PARACENTESIS  COMPARISON:  Previous paracentesis  PROCEDURE: An ultrasound guided paracentesis was thoroughly discussed with the patient and questions answered. The  benefits, risks, alternatives and complications were also discussed. The patient understands and wishes to proceed with the procedure. Written consent was obtained.  Ultrasound was performed to localize and mark an adequate pocket of fluid in the right lower quadrant of the abdomen. The area was then prepped and draped in the normal sterile fashion. 1% Lidocaine was used for local anesthesia. Under ultrasound guidance a 19 gauge Yueh catheter was introduced. Paracentesis was performed. The catheter was removed and a dressing applied.  COMPLICATIONS: None immediate  FINDINGS: Moderate volume of ascites is present.  A total of approximately 120 mL of clear yellow fluid was removed. A fluid sample was sent for laboratory analysis.  IMPRESSION: Successful ultrasound guided paracentesis yielding 120 mL of ascites.  Read by: Ascencion Dike PA-C   Electronically Signed   By: Daryll Brod M.D.   On: 12/09/2013 10:27   US Paracentesis  11/25/2013   CLINICAL DATA:  Cirrhosis, recurrent ascites. Request is made for therapeutic paracentesis.  EXAM: ULTRASOUND GUIDED PARACENTESIS  COMPARISON:  Previous paracentesis  PROCEDURE: An ultrasound guided paracentesis was thoroughly discussed with the patient and questions answered. The benefits, risks, alternatives and complications were also discussed. The patient understands and wishes to proceed with the procedure. Written consent was obtained.  Ultrasound was performed to localize and mark an adequate pocket of fluid in the right lower quadrant of the abdomen. The area was then prepped and draped in the normal sterile fashion. 1% Lidocaine was used for local anesthesia. Under ultrasound guidance a 6 French Safe-T-Centesis catheter was introduced. Paracentesis was performed. The catheter was removed and a dressing applied. Dermabond was used to form a puncture site seal, as the patient has significant soft tissue edema that is weeping.  COMPLICATIONS: None.  FINDINGS: A total of  approximately 1.7 L of clear yellow fluid was removed. A fluid sample was not sent for laboratory analysis.  IMPRESSION: Successful ultrasound guided paracentesis yielding 1.7 L of ascites.  Read by: Ascencion Dike PA-C   Electronically Signed   By: York Cerise  Earleen Newport D.O.   On: 11/25/2013 13:57   US Paracentesis  11/15/2013   INDICATION: Cirrhosis, recurrent ascites. Request is made for diagnostic and therapeutic paracentesis.  EXAM: ULTRASOUND-GUIDED DIAGNOSTIC AND THERAPEUTIC PARACENTESIS  COMPARISON:  None.  MEDICATIONS: None.  COMPLICATIONS: None immediate  TECHNIQUE: Informed written consent was obtained from the patient after a discussion of the risks, benefits and alternatives to treatment. A timeout was performed prior to the initiation of the procedure.  Initial ultrasound scanning demonstrates a moderate amount of ascites within the right lower abdominal quadrant. The right lower abdomen was prepped and draped in the usual sterile fashion. 1% lidocaine was used for local anesthesia. Under direct ultrasound guidance, a 19 gauge, 7-cm, Yueh catheter was introduced. An ultrasound image was saved for documentation purposed. The paracentesis was performed. The catheter was removed and a dressing was applied. The patient tolerated the procedure well without immediate post procedural complication.  FINDINGS: A total of approximately 4 liters of slightly turbid, light yellow fluid was removed. Samples were sent to the laboratory as requested by the clinical team.  IMPRESSION: Successful ultrasound-guided diagnostic and therapeutic paracentesis yielding 4 liters of peritoneal fluid.  Read by: Rowe Robert, PA-C   Electronically Signed   By: Maryclare Bean M.D.   On: 11/15/2013 16:40   Dg Chest Port 1 View  12/05/2013   CLINICAL DATA:  Tachycardia, hip fracture  EXAM: PORTABLE CHEST - 1 VIEW  COMPARISON:  None.  FINDINGS: Normal cardiac silhouette. There is a left PICC line with tip in distal SVC. Chronic bronchitic  markings are noted. No pulmonary edema or pneumothorax. Mild left basilar atelectasis.  IMPRESSION: No interval change. Chronic bronchitic markings and left basilar atelectasis.   Electronically Signed   By: Suzy Bouchard M.D.   On: 12/05/2013 16:39   Dg Chest Port 1 View  11/25/2013   CLINICAL DATA:  Weakness and shortness of breath.  EXAM: PORTABLE CHEST - 1 VIEW  COMPARISON:  PA and lateral chest 11/15/2013.  FINDINGS: Small bilateral pleural effusions on the comparison study of increased. Bilateral interstitial opacities are identified. No pneumothorax is seen. Heart size is normal.  IMPRESSION: Bilateral interstitial opacities could be due to edema or infection.  Some increase in small bilateral pleural effusions, greater on the left.   Electronically Signed   By: Inge Rise M.D.   On: 11/25/2013 03:09    ASSESSMENT AND PLAN  68 yo with history of cirrhosis from NASH, anemia, thrombocytopenia, ascites, hepatic encephalopathy, and recent Strep viridans PNA was readmitted after a fall and left hip fracture. He was found to be in atrial flutter.  1. Atrial flutter: ECGs show atrial flutter with mild RVR back at least to 11/2. HR currently 100-110 improved from 110s-120s. He is cirrhotic and SBP runs in the 90s range. He is not an anticoagulation candidate: fall risk, anemia, thrombocytopenia, coagulopathy, and h/o GI bleeding. Dr. Aundra Dubin discussed stroke risk and why he is not a good candidate for anticoagulation.   - some hypotension overnight, Toprol XL decreased to 12.5 mg BID. HR mainly in 90s, occasionally in 100s  - HR currently controlled, although may add digoxin if necessary - Amiodarone would be difficult given his liver disease (and we do not envision cardioverting him to NSR) but if nothing else works we can consider it.  - if hypotension persist, may consider decrease spironolactone down to 25mg   2. Strep viridans bacteremia: Patient has Strep viridans in his blood  again. He has no history of  significant esophageal varices and has had regular EGDs.   -TEE both to look for endocarditis and to assess his LV/RV function (TTE very difficult) on Monday - I have discussed risk and benefit with patient and family including esophageal bleeding/rupture risk and risk associated with sedation, they display clear understanding and agree to proceed. TEE scheduled for Monday. I will place order.   3. Acute on chronic ?diastolic CHF - difficult to assess volume status. JVD seen, however maybe related to cirrhosis. Pulm exam shows decreased breath sound and rale, LE edema appears to have resolved, Cr stable, will continue IV lasix for now.   - Lasix 40 mg IV bid and follow creatinine, I/Os.  - Continue spironolactone - Replete K as needed.  - paracentesis today.  - TEE to assess LV and RV  4. Cirrhosis 5. Anemia: hgb 8.5, stable 6. Thrombocytopenia: platelet [address]  Hilbert Corrigan PA-C Pager: [phone]   Attending Note:   The patient was seen and examined.  Agree with assessment and plan as noted above.  Changes made to the above note as needed.  I have discussed risk / benefits / risks of TEE.  He understands and agrees to proceed.   Thayer Headings, Brooke Bonito., MD, [facility] 12/10/2013, 1:59 PM [address],  [place] Pager [phone]

## 2013-12-10 NOTE — Progress Notes (Signed)
Patient to transfer to 5W11 report given to receiving nurse Thelma questions answered at this time.  Patient stable at transfer.

## 2013-12-10 NOTE — Progress Notes (Signed)
Patient ID: Clifford Cook, male   DOB: Jul 13, 1945, 68 y.o.   MRN: [id number]         [facility] for Infectious Disease    Date of Admission:  12/05/2013   Total days of antibiotics 6        Day 3 ceftriaxone         Patient Active Problem List   Diagnosis Date Noted  . Bacteremia due to Streptococcus     Priority: High  . Atrial flutter 12/08/2013  . Encephalopathy, hepatic   . Hip fracture 12/05/2013  . SIRS (systemic inflammatory response syndrome) 12/05/2013  . Lactic acidosis 12/05/2013  . Acute confusion 11/25/2013  . Coagulopathy 11/12/2013  . General weakness 11/12/2013  . Bleeding from PICC line 11/11/2013  . Hypokalemia 11/11/2013  . Sepsis 10/31/2013  . Liver cirrhosis secondary to NASH 10/31/2013  . Pneumonia due to streptococcus, group A 10/31/2013  . Ascites 10/31/2013  . Elevated troponin 10/31/2013  . Generalized weakness 10/31/2013  . Hyponatremia 10/31/2013  . Anemia 10/31/2013  . Thrombocytopenia 10/31/2013  . Respiratory failure with hypoxia 10/31/2013  . Rib fracture 10/31/2013  . H/O: GI bleed 04/16/2012  . Tobacco abuse 04/16/2012    . sodium chloride   Intravenous Once  . cefTRIAXone (ROCEPHIN)  IV  2 g Intravenous Q24H  . feeding supplement (RESOURCE BREEZE)  1 Container Oral TID BM  . ferrous sulfate  325 mg Oral BID WC  . furosemide  40 mg Intravenous BID  . lactulose  20 g Oral TID  . magnesium oxide  400 mg Oral Daily  . metoprolol succinate  12.5 mg Oral BID  . pantoprazole  40 mg Oral Daily  . potassium chloride SA  40 mEq Oral BID  . rifaximin  550 mg Oral BID  . sodium chloride  3 mL Intravenous Q12H  . spironolactone  100 mg Oral Daily  . ursodiol  300 mg Oral BID  . Vitamin D (Ergocalciferol)  50,000 Units Oral Q Wed    OBJECTIVE: Blood pressure 90/53, pulse 109, temperature 97.8 F (36.6 C), temperature source Oral, resp. rate 26, height 5\' 9"  (1.753 m), weight 213 lb 3.2 oz (96.707 kg), SpO2 95 %. General: He is alert  and comfortable visiting with family   Lab Results Lab Results  Component Value Date   WBC 6.7 12/10/2013   HGB 8.9* 12/10/2013   HCT 27.0* 12/10/2013   MCV 93.8 12/10/2013   PLT 64* 12/10/2013    Lab Results  Component Value Date   CREATININE 0.90 12/10/2013   BUN 10 12/10/2013   NA 135* 12/10/2013   K 4.7 12/10/2013   CL 101 12/10/2013   CO2 25 12/10/2013    Lab Results  Component Value Date   ALT 17 12/09/2013   AST 29 12/09/2013   ALKPHOS 163* 12/09/2013   BILITOT 1.8* 12/09/2013     Microbiology: Recent Results (from the past 240 hour(s))  Culture, blood (routine x 2)     Status: None (Preliminary result)   Collection Time: 12/05/13  5:08 PM  Result Value Ref Range Status   Specimen Description BLOOD LEFT HAND  4 ML IN [facility] BOTTLE  Final   Special Requests NONE  Final   Culture  Setup Time   Final    12/05/2013 21:36 Performed at [facility]    Culture   Final           BLOOD CULTURE RECEIVED NO GROWTH TO DATE CULTURE WILL  BE HELD FOR 5 DAYS BEFORE ISSUING A FINAL NEGATIVE REPORT Performed at [facility]    Report Status PENDING  Incomplete  Culture, blood (routine x 2)     Status: None   Collection Time: 12/05/13  5:11 PM  Result Value Ref Range Status   Specimen Description BLOOD PICC  5 ML IN [facility] BOTTLE  Final   Special Requests NONE  Final   Culture  Setup Time   Final    12/05/2013 21:36 Performed at [facility]    Culture   Final    VIRIDANS STREPTOCOCCUS Note: THE SIGNIFICANCE OF ISOLATING THIS ORGANISM FROM A SINGLE SET OF BLOOD CULTURES WHEN MULTIPLE SETS ARE DRAWN IS UNCERTAIN. PLEASE NOTIFY THE MICROBIOLOGY DEPARTMENT WITHIN ONE WEEK IF SPECIATION AND SENSITIVITIES ARE REQUIRED. Note: Gram Stain Report Called to,Read Back By and Verified With: KENYAN USSERY ON 12/06/2013 AT 9:36P BY Dennard Nip Performed at [facility]    Report Status 12/08/2013 FINAL  Final  MRSA PCR Screening     Status: None   Collection  Time: 12/06/13 12:48 AM  Result Value Ref Range Status   MRSA by PCR NEGATIVE NEGATIVE Final    Comment:        The GeneXpert MRSA Assay (FDA approved for NASAL specimens only), is one component of a comprehensive MRSA colonization surveillance program. It is not intended to diagnose MRSA infection nor to guide or monitor treatment for MRSA infections.   Culture, blood (routine x 2)     Status: None (Preliminary result)   Collection Time: 12/08/13  2:53 PM  Result Value Ref Range Status   Specimen Description BLOOD RIGHT HAND  Final   Special Requests BOTTLES DRAWN AEROBIC AND ANAEROBIC 10CC  Final   Culture  Setup Time   Final    12/08/2013 21:50 Performed at [facility]    Culture   Final           BLOOD CULTURE RECEIVED NO GROWTH TO DATE CULTURE WILL BE HELD FOR 5 DAYS BEFORE ISSUING A FINAL NEGATIVE REPORT Performed at [facility]    Report Status PENDING  Incomplete  Culture, blood (routine x 2)     Status: None (Preliminary result)   Collection Time: 12/08/13 11:00 PM  Result Value Ref Range Status   Specimen Description BLOOD RIGHT ARM  Final   Special Requests BOTTLES DRAWN AEROBIC AND ANAEROBIC 10CC  Final   Culture  Setup Time   Final    12/09/2013 04:48 Performed at [facility]    Culture   Final           BLOOD CULTURE RECEIVED NO GROWTH TO DATE CULTURE WILL BE HELD FOR 5 DAYS BEFORE ISSUING A FINAL NEGATIVE REPORT Performed at [facility]    Report Status PENDING  Incomplete  Body fluid culture     Status: None (Preliminary result)   Collection Time: 12/09/13  9:18 AM  Result Value Ref Range Status   Specimen Description FLUID ASCITES  Final   Special Requests NONE  Final   Gram Stain   Final    NO WBC SEEN NO ORGANISMS SEEN Performed at [facility]    Culture   Final    NO GROWTH 1 DAY Performed at [facility]    Report Status PENDING  Incomplete  Culture, blood (routine x 2)     Status: None  (Preliminary result)   Collection Time: 12/09/13 10:36 AM  Result Value  Ref Range Status   Specimen Description BLOOD RIGHT WRIST  Final   Special Requests BOTTLES DRAWN AEROBIC AND ANAEROBIC 5CC  Final   Culture  Setup Time   Final    12/09/2013 14:04 Performed at [facility]    Culture   Final           BLOOD CULTURE RECEIVED NO GROWTH TO DATE CULTURE WILL BE HELD FOR 5 DAYS BEFORE ISSUING A FINAL NEGATIVE REPORT Performed at [facility]    Report Status PENDING  Incomplete  Culture, blood (routine x 2)     Status: None (Preliminary result)   Collection Time: 12/09/13 10:41 AM  Result Value Ref Range Status   Specimen Description BLOOD LEFT WRIST  Final   Special Requests BOTTLES DRAWN AEROBIC AND ANAEROBIC 5CC  Final   Culture  Setup Time   Final    12/09/2013 14:05 Performed at [facility]    Culture   Final           BLOOD CULTURE RECEIVED NO GROWTH TO DATE CULTURE WILL BE HELD FOR 5 DAYS BEFORE ISSUING A FINAL NEGATIVE REPORT Performed at [facility]    Report Status PENDING  Incomplete    Assessment: He needs to continue ceftriaxone for persistent viridans strep bacteremia. Repeat blood cultures are negative and he can have a new PICC placed at any time. Duration of therapy will be determined after the results of the TEE are reviewed next week.  Plan: 1. Continue ceftriaxone 2. TEE on Monday, November 30 3. Please call me for any questions this weekend  Michel Bickers, [place] for [place] [phone] pager   [phone] cell 12/10/2013, 4:04 PM

## 2013-12-10 NOTE — Plan of Care (Signed)
Problem: Phase I Progression Outcomes Goal: Pre op pain controlled with appropriate interventions Outcome: Completed/Met Date Met:  12/10/13

## 2013-12-10 NOTE — Progress Notes (Signed)
OT Cancellation Note and Discharge  Patient Details Name: Clifford Cook MRN: [id number] DOB: Nov 15, 1945   Cancelled Treatment:    Reason Eval/Treat Not Completed: Other (comment). Pt evaled and D/c'd on 12/07/13 with plan with HHOT and 24 hour care, this has not changed, so we will not re-eval the pt--PT continuing to follow.  Almon Register [id number] 12/10/2013, 7:29 AM

## 2013-12-10 NOTE — Plan of Care (Signed)
Problem: Phase I Progression Outcomes Goal: Post op clear liquids, advance diet as tolerated Outcome: Completed/Met Date Met:  12/10/13

## 2013-12-10 NOTE — Progress Notes (Signed)
Pt with tele strip showing A. Fib with RVR. Pt is asymptomatic.  Paged provider on call P. Le who stated that is ok. Will continue to monitor pt.

## 2013-12-11 LAB — COMPREHENSIVE METABOLIC PANEL
ALK PHOS: 164 U/L — AB (ref 39–117)
ALT: 17 U/L (ref 0–53)
AST: 40 U/L — AB (ref 0–37)
Albumin: 1.8 g/dL — ABNORMAL LOW (ref 3.5–5.2)
Anion gap: 8 (ref 5–15)
BILIRUBIN TOTAL: 1.9 mg/dL — AB (ref 0.3–1.2)
BUN: 10 mg/dL (ref 6–23)
CHLORIDE: 99 meq/L (ref 96–112)
CO2: 27 meq/L (ref 19–32)
Calcium: 7.9 mg/dL — ABNORMAL LOW (ref 8.4–10.5)
Creatinine, Ser: 0.95 mg/dL (ref 0.50–1.35)
GFR calc Af Amer: >90 mL/min (ref >90)
GFR, EST NON AFRICAN AMERICAN: 83 mL/min — AB (ref >90)
Glucose, Bld: 105 mg/dL — ABNORMAL HIGH (ref 70–99)
POTASSIUM: 4.7 meq/L (ref 3.7–5.3)
Sodium: 134 mEq/L — ABNORMAL LOW (ref 137–147)
Total Protein: 5.5 g/dL — ABNORMAL LOW (ref 6.0–8.3)

## 2013-12-11 LAB — CULTURE, BLOOD (ROUTINE X 2): Culture: NO GROWTH

## 2013-12-11 NOTE — Progress Notes (Signed)
Consulting cardiologist:  Dr. Loralie Champagne  Seen for followup: AF/flutter, bacteremia - request for TEE  Subjective:    No chest pain or palpitations.   Objective:   Temp:  [97.8 F (36.6 C)-98.8 F (37.1 C)] 98.3 F (36.8 C) (11/28 0541) Pulse Rate:  [101-116] 102 (11/27 2201) Resp:  [17-26] 18 (11/28 0541) BP: (90-104)/(53-63) 102/62 mmHg (11/28 0600) SpO2:  [92 %-96 %] 94 % (11/28 0541) Weight:  [216 lb 4.8 oz (98.113 kg)-217 lb 2.5 oz (98.5 kg)] 217 lb 2.5 oz (98.5 kg) (11/28 0541) Last BM Date: 12/10/13  Filed Weights   12/09/13 0500 12/10/13 1950 12/11/13 0541  Weight: 213 lb 3.2 oz (96.707 kg) 216 lb 4.8 oz (98.113 kg) 217 lb 2.5 oz (98.5 kg)    Intake/Output Summary (Last 24 hours) at 12/11/13 0938 Last data filed at 12/11/13 0900  Gross per 24 hour  Intake    320 ml  Output   1290 ml  Net   -970 ml    Telemetry: Atrial fibrillation.  Exam:  General: No distress.  Lungs: Clear, nonlabored.  Cardiac: Irregular, no gallop.  Extremities: No pitting edema.   Lab Results:  Basic Metabolic Panel:  Recent Labs Lab 12/08/13 1615 12/09/13 0230 12/10/13 0235 12/11/13 0427  NA  --  133* 135* 134*  K  --  3.6* 4.7 4.7  CL  --  98 101 99  CO2  --  26 25 27   GLUCOSE  --  149* 101* 105*  BUN  --  9 10 10   CREATININE  --  0.96 0.90 0.95  CALCIUM  --  7.5* 7.6* 7.9*  MG 1.8  --   --   --     Liver Function Tests:  Recent Labs Lab 12/06/13 0630 12/09/13 0230 12/11/13 0427  AST 37 29 40*  ALT 31 17 17   ALKPHOS 156* 163* 164*  BILITOT 2.2* 1.8* 1.9*  PROT 5.2* 5.2* 5.5*  ALBUMIN 1.7* 1.6* 1.8*    CBC:  Recent Labs Lab 12/08/13 0609 12/09/13 0230 12/10/13 0235  WBC 6.2 6.7 6.7  HGB 7.7* 8.5* 8.9*  HCT 23.0* 25.5* 27.0*  MCV 93.1 92.4 93.8  PLT 71* 70* 64*    Coagulation:  Recent Labs Lab 12/05/13 1233 12/06/13 0630  INR 1.50* 1.58*     Medications:   Scheduled Medications: . cefTRIAXone (ROCEPHIN)  IV  2 g  Intravenous Q24H  . feeding supplement (RESOURCE BREEZE)  1 Container Oral TID BM  . ferrous sulfate  325 mg Oral BID WC  . furosemide  40 mg Intravenous BID  . lactulose  20 g Oral TID  . magnesium oxide  400 mg Oral Daily  . metoprolol succinate  12.5 mg Oral BID  . pantoprazole  40 mg Oral Daily  . potassium chloride SA  40 mEq Oral BID  . rifaximin  550 mg Oral BID  . sodium chloride  3 mL Intravenous Q12H  . spironolactone  100 mg Oral Daily  . ursodiol  300 mg Oral BID  . Vitamin D (Ergocalciferol)  50,000 Units Oral Q Wed     Infusions: . sodium chloride       PRN Medications:  acetaminophen **OR** acetaminophen, diphenhydrAMINE, HYDROcodone-acetaminophen, HYDROmorphone, menthol-cetylpyridinium **OR** phenol, morphine injection, temazepam   Assessment:   1. AF/flutter, continue Toprol XL for rate control. Not good candidate for anticoagulation based on chart review. Low normal to low blood pressure has limited up-titration of heart rate control somewhat. No  plan to cardiovert.  2. Strep viridans bacteremia, scheduled for TEE on Monday.  3. Suspected acute on chronic diastolic heart failure. Further clarify LV/RV function with TEE. Continues on Lasix and Aldactone.  4. Cirrhosis, no known esophageal varices.  5. Anemia and thrombocytopenia.   Plan/Discussion:    Continue Toprol XL. Holding off Amiodarone with liver disease and no plan to cardiovert. If cannot tolerate further increase in beta blocker, might consider adding digoxin if further rate control needed. For TEE on Monday as noted above.   Satira Sark, M.D., F.A.C.C.

## 2013-12-11 NOTE — Plan of Care (Signed)
Problem: Phase II Progression Outcomes Goal: Bed to chair Outcome: Completed/Met Date Met:  12/11/13

## 2013-12-11 NOTE — Plan of Care (Signed)
Problem: Phase II Progression Outcomes Goal: Tolerating diet Outcome: Completed/Met Date Met:  12/11/13     

## 2013-12-11 NOTE — Plan of Care (Signed)
Problem: Phase III Progression Outcomes Goal: Discharge plan remains appropriate-arrangements made Outcome: Progressing     

## 2013-12-11 NOTE — Plan of Care (Signed)
Problem: Phase III Progression Outcomes Goal: Pain controlled on oral analgesia Outcome: Completed/Met Date Met:  12/11/13

## 2013-12-11 NOTE — Progress Notes (Signed)
PROGRESS NOTE  Clifford Cook EYC:[id number] DOB: 1945/06/11 DOA: 12/05/2013 PCP: [name], MD  Assessment/Plan: Hip fracture - status post mechanical fall. S/p pinning.  SCDs only due to coagulopathy, thrombocytopenia and risk for bleed.  Sepsis/bacteremia:  Culture confirms persistent strep viridans.  Patient reports about 5 teeth fell out over the past month or so. No mouth pain. Paracentesis shows <250 PMN, but has been on abx since admission. For TEE Monday. Continue rocephin. ID following. Appreciate assistance.  Ok to replace picc per ID  Atrial fib/flutter:  No mention in previous notes, but EKG from 11/12 and 11/2 shows atrial fib/flutter. Not a candidate for anticoagulation due to coagulopathy and thrombocytopenia and history of GI bleed. NSR on monitor currently. TEE monday.  NASH cirrhosis: diuretics resumed  Ascites - s/p diagnositic paracentesis with PMN <250  Coagulopathy - Due to liver disease  Anemia - s/p 3 units pRBC. No evidence of ongoing bleeding  Thrombocytopenia - Due to his liver disease  Hyponatremia - Mild, continue to monitor   History of hepatic encephalopathy - continue lactulose. No clinical signs of encephalopathy currently. -added xifaxan  Chronically low blood pressure. Ambulated with PT down the hall. Asymptomatic.   Code Status: full Family Communication: at bedside Disposition Plan: home with home health   Consultants:  Ortho  cardiology  Procedures:  Cannulated hip pinning    HPI/Subjective: No complaints  Objective: Filed Vitals:   12/11/13 0600  BP: 102/62  Pulse:   Temp:   Resp:     Intake/Output Summary (Last 24 hours) at 12/11/13 1058 Last data filed at 12/11/13 0900  Gross per 24 hour  Intake    320 ml  Output   1290 ml  Net   -970 ml   Filed Weights   12/09/13 0500 12/10/13 1950 12/11/13 0541  Weight: 96.707 kg (213 lb 3.2 oz) 98.113 kg (216 lb 4.8 oz) 98.5 kg (217 lb 2.5 oz)   tele: atrial fib  rate around 100 Exam:   General:  A+Ox3, NAD  Cardiovascular: irreg irreg no MGR  Respiratory: clear without WRR  Abdomen: +BS, soft, nt, ascites present  Musculoskeletal: 1+ edema, no clubbing or cyanosis  Data Reviewed: Basic Metabolic Panel:  Recent Labs Lab 12/06/13 0630 12/08/13 0609 12/08/13 1615 12/09/13 0230 12/10/13 0235 12/11/13 0427  NA 134* 132*  --  133* 135* 134*  K 4.3 4.0  --  3.6* 4.7 4.7  CL 98 98  --  98 101 99  CO2 26 25  --  26 25 27   GLUCOSE 75 107*  --  149* 101* 105*  BUN 14 11  --  9 10 10   CREATININE 1.10 0.92  --  0.96 0.90 0.95  CALCIUM 7.7* 7.5*  --  7.5* 7.6* 7.9*  MG  --   --  1.8  --   --   --    Liver Function Tests:  Recent Labs Lab 12/05/13 1233 12/06/13 0630 12/09/13 0230 12/11/13 0427  AST 40* 37 29 40*  ALT 34 31 17 17   ALKPHOS 166* 156* 163* 164*  BILITOT 2.3* 2.2* 1.8* 1.9*  PROT 5.5* 5.2* 5.2* 5.5*  ALBUMIN 1.8* 1.7* 1.6* 1.8*   No results for input(s): LIPASE, AMYLASE in the last 168 hours.  Recent Labs Lab 12/05/13 1708  AMMONIA 95*   CBC:  Recent Labs Lab 12/05/13 1233 12/06/13 0630 12/06/13 1950 12/08/13 0609 12/09/13 0230 12/10/13 0235  WBC 6.3 6.1 6.7 6.2 6.7 6.7  NEUTROABS 4.4  --   --   --   --   --  HGB 8.1* 7.8* 8.1* 7.7* 8.5* 8.9*  HCT 23.9* 23.3* 24.3* 23.0* 25.5* 27.0*  MCV 94.8 92.1 92.7 93.1 92.4 93.8  PLT PLATELET CLUMPS NOTED ON SMEAR, COUNT APPEARS DECREASED 80* 72* 71* 70* 64*   Cardiac Enzymes:  Recent Labs Lab 12/05/13 1233  TROPONINI <0.30   BNP (last 3 results)  Recent Labs  11/15/13 1237  PROBNP 864.3*   CBG: No results for input(s): GLUCAP in the last 168 hours.  Recent Results (from the past 240 hour(s))  Culture, blood (routine x 2)     Status: None (Preliminary result)   Collection Time: 12/05/13  5:08 PM  Result Value Ref Range Status   Specimen Description BLOOD LEFT HAND  4 ML IN [facility] BOTTLE  Final   Special Requests NONE  Final   Culture  Setup  Time   Final    12/05/2013 21:36 Performed at [facility]    Culture   Final           BLOOD CULTURE RECEIVED NO GROWTH TO DATE CULTURE WILL BE HELD FOR 5 DAYS BEFORE ISSUING A FINAL NEGATIVE REPORT Performed at [facility]    Report Status PENDING  Incomplete  Culture, blood (routine x 2)     Status: None   Collection Time: 12/05/13  5:11 PM  Result Value Ref Range Status   Specimen Description BLOOD PICC  5 ML IN [facility] BOTTLE  Final   Special Requests NONE  Final   Culture  Setup Time   Final    12/05/2013 21:36 Performed at [facility]    Culture   Final    VIRIDANS STREPTOCOCCUS Note: THE SIGNIFICANCE OF ISOLATING THIS ORGANISM FROM A SINGLE SET OF BLOOD CULTURES WHEN MULTIPLE SETS ARE DRAWN IS UNCERTAIN. PLEASE NOTIFY THE MICROBIOLOGY DEPARTMENT WITHIN ONE WEEK IF SPECIATION AND SENSITIVITIES ARE REQUIRED. Note: Gram Stain Report Called to,Read Back By and Verified With: KENYAN USSERY ON 12/06/2013 AT 9:36P BY Dennard Nip Performed at [facility]    Report Status 12/08/2013 FINAL  Final  MRSA PCR Screening     Status: None   Collection Time: 12/06/13 12:48 AM  Result Value Ref Range Status   MRSA by PCR NEGATIVE NEGATIVE Final    Comment:        The GeneXpert MRSA Assay (FDA approved for NASAL specimens only), is one component of a comprehensive MRSA colonization surveillance program. It is not intended to diagnose MRSA infection nor to guide or monitor treatment for MRSA infections.   Culture, blood (routine x 2)     Status: None (Preliminary result)   Collection Time: 12/08/13  2:53 PM  Result Value Ref Range Status   Specimen Description BLOOD RIGHT HAND  Final   Special Requests BOTTLES DRAWN AEROBIC AND ANAEROBIC 10CC  Final   Culture  Setup Time   Final    12/08/2013 21:50 Performed at [facility]    Culture   Final           BLOOD CULTURE RECEIVED NO GROWTH TO DATE CULTURE WILL BE HELD FOR 5 DAYS BEFORE ISSUING A  FINAL NEGATIVE REPORT Performed at [facility]    Report Status PENDING  Incomplete  Culture, blood (routine x 2)     Status: None (Preliminary result)   Collection Time: 12/08/13 11:00 PM  Result Value Ref Range Status   Specimen Description BLOOD RIGHT ARM  Final   Special Requests BOTTLES DRAWN AEROBIC AND ANAEROBIC 10CC  Final  Culture  Setup Time   Final    12/09/2013 04:48 Performed at [facility]    Culture   Final           BLOOD CULTURE RECEIVED NO GROWTH TO DATE CULTURE WILL BE HELD FOR 5 DAYS BEFORE ISSUING A FINAL NEGATIVE REPORT Performed at [facility]    Report Status PENDING  Incomplete  Body fluid culture     Status: None (Preliminary result)   Collection Time: 12/09/13  9:18 AM  Result Value Ref Range Status   Specimen Description FLUID ASCITES  Final   Special Requests NONE  Final   Gram Stain   Final    NO WBC SEEN NO ORGANISMS SEEN Performed at [facility]    Culture   Final    NO GROWTH 1 DAY Performed at [facility]    Report Status PENDING  Incomplete  Culture, blood (routine x 2)     Status: None (Preliminary result)   Collection Time: 12/09/13 10:36 AM  Result Value Ref Range Status   Specimen Description BLOOD RIGHT WRIST  Final   Special Requests BOTTLES DRAWN AEROBIC AND ANAEROBIC 5CC  Final   Culture  Setup Time   Final    12/09/2013 14:04 Performed at [facility]    Culture   Final           BLOOD CULTURE RECEIVED NO GROWTH TO DATE CULTURE WILL BE HELD FOR 5 DAYS BEFORE ISSUING A FINAL NEGATIVE REPORT Performed at [facility]    Report Status PENDING  Incomplete  Culture, blood (routine x 2)     Status: None (Preliminary result)   Collection Time: 12/09/13 10:41 AM  Result Value Ref Range Status   Specimen Description BLOOD LEFT WRIST  Final   Special Requests BOTTLES DRAWN AEROBIC AND ANAEROBIC 5CC  Final   Culture  Setup Time   Final    12/09/2013 14:05 Performed at  [facility]    Culture   Final           BLOOD CULTURE RECEIVED NO GROWTH TO DATE CULTURE WILL BE HELD FOR 5 DAYS BEFORE ISSUING A FINAL NEGATIVE REPORT Performed at [facility]    Report Status PENDING  Incomplete     Studies: No results found.  Scheduled Meds: . cefTRIAXone (ROCEPHIN)  IV  2 g Intravenous Q24H  . feeding supplement (RESOURCE BREEZE)  1 Container Oral TID BM  . ferrous sulfate  325 mg Oral BID WC  . furosemide  40 mg Intravenous BID  . lactulose  20 g Oral TID  . magnesium oxide  400 mg Oral Daily  . metoprolol succinate  12.5 mg Oral BID  . pantoprazole  40 mg Oral Daily  . potassium chloride SA  40 mEq Oral BID  . rifaximin  550 mg Oral BID  . sodium chloride  3 mL Intravenous Q12H  . spironolactone  100 mg Oral Daily  . ursodiol  300 mg Oral BID  . Vitamin D (Ergocalciferol)  50,000 Units Oral Q Wed   Continuous Infusions:   Antibiotics Given (last 72 hours)    Date/Time Action Medication Dose Rate   12/08/13 1345 Given   cefTRIAXone (ROCEPHIN) 1 g in dextrose 5 % 50 mL IVPB - Premix 1 g 100 mL/hr   12/08/13 2142 Given   rifaximin (XIFAXAN) tablet 550 mg 550 mg    12/09/13 0945 Given   rifaximin (XIFAXAN) tablet 550 mg  550 mg    12/09/13 1459 Given   cefTRIAXone (ROCEPHIN) 2 g in dextrose 5 % 50 mL IVPB - Premix 2 g 100 mL/hr   12/09/13 2226 Given   rifaximin (XIFAXAN) tablet 550 mg 550 mg    12/10/13 1057 Given   rifaximin (XIFAXAN) tablet 550 mg 550 mg    12/10/13 1319 Given   cefTRIAXone (ROCEPHIN) 2 g in dextrose 5 % 50 mL IVPB - Premix 2 g 100 mL/hr   12/10/13 2202 Given   rifaximin (XIFAXAN) tablet 550 mg 550 mg    12/11/13 1039 Given   rifaximin (XIFAXAN) tablet 550 mg 550 mg      Time spent: 25 min  Kahner Yanik L  [facility] Contact:  [email or web address], password [facility] 12/11/2013, 10:58 AM  LOS: 6 days

## 2013-12-11 NOTE — Progress Notes (Signed)
Physical Therapy Treatment Patient Details Name: Clifford Cook MRN: [id number] DOB: Oct 10, 1945 Today's Date: 12/11/2013    History of Present Illness pt presents post fall with L hip fx post cannulated pinning.  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    PT Comments    Overall, patient is progressing well towards physical therapy goals. Required min assist briefly for balance today while ambulating. SpO2 down to 87% on room air but quickly elevated >92% with pursed lip breathing and 1L supplemental O2. Patient will continue to benefit from skilled physical therapy services to further improve independence with functional mobility.  Follow Up Recommendations  Home health PT;Supervision/Assistance - 24 hour     Equipment Recommendations  None recommended by PT    Recommendations for Other Services       Precautions / Restrictions Precautions Precautions: Fall Restrictions Weight Bearing Restrictions: Yes LLE Weight Bearing: Weight bearing as tolerated    Mobility  Bed Mobility Overal bed mobility: Modified Independent                Transfers Overall transfer level: Needs assistance Equipment used: Rolling walker (2 wheeled) Transfers: Sit to/from Stand Sit to Stand: Min guard         General transfer comment: Close guard for safety. VC for hand placement. Good stability once holding rolling walker. Fair control with descent into chair but complains of hip pain upon sitting.   Ambulation/Gait Ambulation/Gait assistance: Min assist Ambulation Distance (Feet): 125 Feet Assistive device: Rolling walker (2 wheeled) Gait Pattern/deviations: Step-through pattern;Decreased stride length;Antalgic;Staggering right   Gait velocity interpretation: Below normal speed for age/gender General Gait Details: Continues to demonstrate mildly antalgic gait pattern. Improved SpO2 today with 93%  on room air, down to 87% but returns to 92% and greater  with 1L supplemental O2 and cues for pursed lip breathing technique. pt required  min assist for one instance of loss of balance towards his right side with cues to keep RW on ground while ambulating. HR did elevated to 130 with monitor indicating a-fib. HR was 90s at rest.   Financial trader Rankin (Stroke Patients Only)       Balance                                    Cognition Arousal/Alertness: Awake/alert Behavior During Therapy: WFL for tasks assessed/performed Overall Cognitive Status: Within Functional Limits for tasks assessed                      Exercises      General Comments General comments (skin integrity, edema, etc.): Closely monitored SpO2 and HR while supine, sitting, standing, and ambulating. 93%, 92%, 90 - 92%, 87% respectively.       Pertinent Vitals/Pain Pain Assessment: 0-10 Pain Score:  ("Hurts when I stand on it") Pain Location: Lt hip Pain Descriptors / Indicators: Sore Pain Intervention(s): Monitored during session;Repositioned    Home Living                      Prior Function            PT Goals (current goals can now be found in the care plan section) Acute Rehab PT Goals Patient Stated Goal: Go home PT Goal Formulation: With patient Time  For Goal Achievement: 12/14/13 Potential to Achieve Goals: Good Progress towards PT goals: Progressing toward goals    Frequency  Min 5X/week    PT Plan Current plan remains appropriate    Co-evaluation             End of Session Equipment Utilized During Treatment: Gait belt;Oxygen Activity Tolerance: Patient tolerated treatment well Patient left: in chair;with call bell/phone within reach;with family/visitor present     Time: 1225-1251 PT Time Calculation (min) (ACUTE ONLY): 26 min  Charges:  $Gait Training: 8-22 mins $Therapeutic Activity: 8-22 mins                    G Codes:      Ellouise Newer Dec 23, 2013, 2:00 PM  Camille Bal [place], [place]

## 2013-12-11 NOTE — Plan of Care (Signed)
Problem: Acute Rehab PT Goals(only PT should resolve) Goal: Pt Will Go Supine/Side To Sit Outcome: Completed/Met Date Met:  12/11/13

## 2013-12-12 ENCOUNTER — Inpatient Hospital Stay (HOSPITAL_COMMUNITY): Payer: Medicare Other

## 2013-12-12 LAB — BODY FLUID CULTURE
CULTURE: NO GROWTH
GRAM STAIN: NONE SEEN

## 2013-12-12 MED ORDER — OXYCODONE HCL 5 MG PO TABS: 10.0000 mg | ORAL_TABLET | Freq: Once | ORAL | Status: DC

## 2013-12-12 MED ORDER — OXYCODONE HCL 5 MG PO TABS
5.0000 mg | ORAL_TABLET | Freq: Once | ORAL | Status: AC
  Administered 2013-12-12: 5 mg via ORAL
  Filled 2013-12-12: qty 1

## 2013-12-12 MED ORDER — OXYCODONE HCL 5 MG PO TABS
5.0000 mg | ORAL_TABLET | Freq: Four times a day (QID) | ORAL | Status: DC | PRN
  Administered 2013-12-14: 5 mg via ORAL
  Filled 2013-12-12 (×2): qty 1

## 2013-12-12 MED ORDER — TRAMADOL HCL 50 MG PO TABS: 50.0000 mg | ORAL_TABLET | ORAL | Status: DC | PRN

## 2013-12-12 MED ORDER — TRAMADOL HCL 50 MG PO TABS: 50.0000 mg | ORAL_TABLET | Freq: Once | ORAL | Status: DC

## 2013-12-12 MED ORDER — SODIUM CHLORIDE 0.9 % IV BOLUS (SEPSIS)
250.0000 mL | Freq: Once | INTRAVENOUS | Status: AC
  Administered 2013-12-12: 250 mL via INTRAVENOUS

## 2013-12-12 MED ORDER — HYDROMORPHONE HCL 2 MG PO TABS: 1.0000 mg | ORAL_TABLET | Freq: Four times a day (QID) | ORAL | Status: DC | PRN

## 2013-12-12 MED ORDER — OXYCODONE HCL 5 MG PO TABS: 5.0000 mg | ORAL_TABLET | ORAL | Status: DC | PRN

## 2013-12-12 MED ORDER — FUROSEMIDE 40 MG PO TABS
40.0000 mg | ORAL_TABLET | Freq: Two times a day (BID) | ORAL | Status: DC
  Administered 2013-12-12 – 2013-12-13 (×3): 40 mg via ORAL
  Filled 2013-12-12 (×6): qty 1

## 2013-12-12 NOTE — Plan of Care (Signed)
Problem: Consults Goal: Hip/Femur Fracture Patient Education See Patient Education Module for education specifics.  Outcome: Completed/Met Date Met:  12/12/13 Goal: Skin Care Protocol Initiated - if Braden Score 18 or less If consults are not indicated, leave blank or document N/A  Outcome: Completed/Met Date Met:  12/12/13 Goal: Nutrition Consult-if indicated Outcome: Completed/Met Date Met:  12/12/13 Goal: Diabetes Guidelines if Diabetic/Glucose > 140 If diabetic or lab glucose is > 140 mg/dl - Initiate Diabetes/Hyperglycemia Guidelines & Document Interventions  Outcome: Not Applicable Date Met:  28/63/81  Problem: Phase I Progression Outcomes Goal: Pre op Medical MD consult, if indicated Outcome: Completed/Met Date Met:  12/12/13 Goal: Pre op labs/procedures/consults per MD order Outcome: Completed/Met Date Met:  12/12/13 Goal: Pre op Protime within normal limits Outcome: Completed/Met Date Met:  12/12/13 Goal: Pre op NPO per MD orders Outcome: Completed/Met Date Met:  12/12/13 Goal: Pre op-initial discharge plan identified Outcome: Completed/Met Date Met:  12/12/13 Goal: Pre op hemodynamically stable Outcome: Completed/Met Date Met:  12/12/13 Goal: Post op pain controlled with appropriate interventions Outcome: Completed/Met Date Met:  12/12/13 Goal: Post op hemodynamically stable Outcome: Completed/Met Date Met:  12/12/13 Goal: Post op CMS/Neurovascular status WDL Outcome: Completed/Met Date Met:  12/12/13 Goal: Other Phase I Outcomes/Goals Outcome: Completed/Met Date Met:  12/12/13

## 2013-12-12 NOTE — Progress Notes (Signed)
Peripherally Inserted Central Catheter/Midline Placement  The IV Nurse has discussed with the patient and/or persons authorized to consent for the patient, the purpose of this procedure and the potential benefits and risks involved with this procedure.  The benefits include less needle sticks, lab draws from the catheter and patient may be discharged home with the catheter.  Risks include, but not limited to, infection, bleeding, blood clot (thrombus formation), and puncture of an artery; nerve damage and irregular heat beat.  Alternatives to this procedure were also discussed.  PICC/Midline Placement Documentation  PICC / Midline Single Lumen 93/90/30 PICC Right Basilic 41 cm 0 cm (Active)  Indication for Insertion or Continuance of Line Home intravenous therapies (PICC only) 12/12/2013  5:01 PM  Exposed Catheter (cm) 0 cm 12/12/2013  5:01 PM  Site Assessment Clean;Dry;Intact 12/12/2013  5:01 PM  Line Status Flushed;Saline locked;Blood return noted 12/12/2013  5:01 PM  Dressing Type Transparent 12/12/2013  5:01 PM  Dressing Change Due 12/19/13 12/12/2013  5:01 PM       Rolena Infante 12/12/2013, 5:03 PM

## 2013-12-12 NOTE — Progress Notes (Signed)
PROGRESS NOTE  Clifford Cook UXN:[id number] DOB: 1945-04-13 DOA: 12/05/2013 PCP: [name], MD Summary 68 year old male with Clifford Cook cirrhosis and recent strep viridans bacteremia treated with 4 weeks of antibiotics fell and fractured left hip. Noted to be in atrial flutter with rapid ventricular response. Upon further review, last several EKGs prior to admission have showed same. Blood culture positive for strep viridans. ID and cardiology following.  Assessment/Plan: L Hip fracture - status post mechanical fall. S/p pinning.  SCDs only due to coagulopathy, thrombocytopenia and risk for bleed.  Patient will eventually go home with home health once stable.  Sepsis/bacteremia:  One out of 2 blood cultures on admission positive for persistent strep viridans.  Repeat paracenteses have been negative for SBP. For TEE tomorrow. Patient reports about 5 teeth fell out over the past month or so. No mouth pain. Continue rocephin. ID following. Appreciate assistance.  Ok to replace picc per ID  Atrial fib/flutter:  No mention in previous notes, but EKG from 11/12 and 11/2 shows atrial fib/flutter. Not a candidate for anticoagulation due to coagulopathy and thrombocytopenia and history of GI bleed. Currently rate controlled. TEE monday.  Transthoracic echocardiogram with poor acoustic windows but probably preserved ejection fraction  NASH cirrhosis: diuretics resumed. Change to by mouth Lasix.  Ascites - s/p diagnositic paracentesis with PMN <250  Coagulopathy - Due to cirrhosis  Anemia - s/p 3 units pRBC. No evidence of ongoing bleeding  Thrombocytopenia - Due to cirrhosis  Hyponatremia - Mild, continue to monitor   History of hepatic encephalopathy - continue lactulose. No asterixis, but sleepier today. -added xifaxan. Check ammonia level in the morning  Chronically low blood pressure. Ambulated with PT down the hall. Asymptomatic. Received fluid bolus last night for systolic blood pressure of  72.  Code Status: full Family Communication: at bedside Disposition Plan: home with home health   Consultants:  Ortho  Cardiology  Infectious disease  Procedures:  Left Cannulated hip pinning  HPI/Subjective: Sleepy today. No other complaints.  Objective: Filed Vitals:   12/12/13 0955  BP: 93/44  Pulse: 92  Temp:   Resp:     Intake/Output Summary (Last 24 hours) at 12/12/13 1151 Last data filed at 12/12/13 1019  Gross per 24 hour  Intake    723 ml  Output    825 ml  Net   -102 ml   Filed Weights   12/09/13 0500 12/10/13 1950 12/11/13 0541  Weight: 96.707 kg (213 lb 3.2 oz) 98.113 kg (216 lb 4.8 oz) 98.5 kg (217 lb 2.5 oz)   tele: atrial fib rate around 70 Exam:   General:  A+Ox3, NAD  Cardiovascular: irreg irreg no MGR  Respiratory: clear without WRR  Abdomen: +BS, soft, nt, ascites present  Musculoskeletal: 1+ edema, no clubbing or cyanosis. Dressing clean dry and intact.  Data Reviewed: Basic Metabolic Panel:  Recent Labs Lab 12/06/13 0630 12/08/13 0609 12/08/13 1615 12/09/13 0230 12/10/13 0235 12/11/13 0427  NA 134* 132*  --  133* 135* 134*  K 4.3 4.0  --  3.6* 4.7 4.7  CL 98 98  --  98 101 99  CO2 26 25  --  26 25 27   GLUCOSE 75 107*  --  149* 101* 105*  BUN 14 11  --  9 10 10   CREATININE 1.10 0.92  --  0.96 0.90 0.95  CALCIUM 7.7* 7.5*  --  7.5* 7.6* 7.9*  MG  --   --  1.8  --   --   --  Liver Function Tests:  Recent Labs Lab 12/05/13 1233 12/06/13 0630 12/09/13 0230 12/11/13 0427  AST 40* 37 29 40*  ALT 34 31 17 17   ALKPHOS 166* 156* 163* 164*  BILITOT 2.3* 2.2* 1.8* 1.9*  PROT 5.5* 5.2* 5.2* 5.5*  ALBUMIN 1.8* 1.7* 1.6* 1.8*   No results for input(s): LIPASE, AMYLASE in the last 168 hours.  Recent Labs Lab 12/05/13 1708  AMMONIA 95*   CBC:  Recent Labs Lab 12/05/13 1233 12/06/13 0630 12/06/13 1950 12/08/13 0609 12/09/13 0230 12/10/13 0235  WBC 6.3 6.1 6.7 6.2 6.7 6.7  NEUTROABS 4.4  --   --   --    --   --   HGB 8.1* 7.8* 8.1* 7.7* 8.5* 8.9*  HCT 23.9* 23.3* 24.3* 23.0* 25.5* 27.0*  MCV 94.8 92.1 92.7 93.1 92.4 93.8  PLT PLATELET CLUMPS NOTED ON SMEAR, COUNT APPEARS DECREASED 80* 72* 71* 70* 64*   Cardiac Enzymes:  Recent Labs Lab 12/05/13 1233  TROPONINI <0.30   BNP (last 3 results)  Recent Labs  11/15/13 1237  PROBNP 864.3*   CBG: No results for input(s): GLUCAP in the last 168 hours.  Recent Results (from the past 240 hour(s))  Culture, blood (routine x 2)     Status: None   Collection Time: 12/05/13  5:08 PM  Result Value Ref Range Status   Specimen Description BLOOD LEFT HAND  4 ML IN [facility] BOTTLE  Final   Special Requests NONE  Final   Culture  Setup Time   Final    12/05/2013 21:36 Performed at [place]   Final    NO GROWTH 5 DAYS Performed at [facility]    Report Status 12/11/2013 FINAL  Final  Culture, blood (routine x 2)     Status: None   Collection Time: 12/05/13  5:11 PM  Result Value Ref Range Status   Specimen Description BLOOD PICC  5 ML IN [facility] BOTTLE  Final   Special Requests NONE  Final   Culture  Setup Time   Final    12/05/2013 21:36 Performed at [facility]    Culture   Final    VIRIDANS STREPTOCOCCUS Note: THE SIGNIFICANCE OF ISOLATING THIS ORGANISM FROM A SINGLE SET OF BLOOD CULTURES WHEN MULTIPLE SETS ARE DRAWN IS UNCERTAIN. PLEASE NOTIFY THE MICROBIOLOGY DEPARTMENT WITHIN ONE WEEK IF SPECIATION AND SENSITIVITIES ARE REQUIRED. Note: Gram Stain Report Called to,Read Back By and Verified With: KENYAN USSERY ON 12/06/2013 AT 9:36P BY Dennard Nip Performed at [facility]    Report Status 12/08/2013 FINAL  Final  MRSA PCR Screening     Status: None   Collection Time: 12/06/13 12:48 AM  Result Value Ref Range Status   MRSA by PCR NEGATIVE NEGATIVE Final    Comment:        The GeneXpert MRSA Assay (FDA approved for NASAL specimens only), is one component of a comprehensive MRSA  colonization surveillance program. It is not intended to diagnose MRSA infection nor to guide or monitor treatment for MRSA infections.   Culture, blood (routine x 2)     Status: None (Preliminary result)   Collection Time: 12/08/13  2:53 PM  Result Value Ref Range Status   Specimen Description BLOOD RIGHT HAND  Final   Special Requests BOTTLES DRAWN AEROBIC AND ANAEROBIC 10CC  Final   Culture  Setup Time   Final    12/08/2013 21:50 Performed at [facility]  Culture   Final           BLOOD CULTURE RECEIVED NO GROWTH TO DATE CULTURE WILL BE HELD FOR 5 DAYS BEFORE ISSUING A FINAL NEGATIVE REPORT Performed at [facility]    Report Status PENDING  Incomplete  Culture, blood (routine x 2)     Status: None (Preliminary result)   Collection Time: 12/08/13 11:00 PM  Result Value Ref Range Status   Specimen Description BLOOD RIGHT ARM  Final   Special Requests BOTTLES DRAWN AEROBIC AND ANAEROBIC 10CC  Final   Culture  Setup Time   Final    12/09/2013 04:48 Performed at [facility]    Culture   Final           BLOOD CULTURE RECEIVED NO GROWTH TO DATE CULTURE WILL BE HELD FOR 5 DAYS BEFORE ISSUING A FINAL NEGATIVE REPORT Performed at [facility]    Report Status PENDING  Incomplete  Body fluid culture     Status: None (Preliminary result)   Collection Time: 12/09/13  9:18 AM  Result Value Ref Range Status   Specimen Description FLUID ASCITES  Final   Special Requests NONE  Final   Gram Stain   Final    NO WBC SEEN NO ORGANISMS SEEN Performed at [facility]    Culture   Final    NO GROWTH 2 DAYS Performed at [facility]    Report Status PENDING  Incomplete  Culture, blood (routine x 2)     Status: None (Preliminary result)   Collection Time: 12/09/13 10:36 AM  Result Value Ref Range Status   Specimen Description BLOOD RIGHT WRIST  Final   Special Requests BOTTLES DRAWN AEROBIC AND ANAEROBIC 5CC  Final   Culture  Setup  Time   Final    12/09/2013 14:04 Performed at [facility]    Culture   Final           BLOOD CULTURE RECEIVED NO GROWTH TO DATE CULTURE WILL BE HELD FOR 5 DAYS BEFORE ISSUING A FINAL NEGATIVE REPORT Performed at [facility]    Report Status PENDING  Incomplete  Culture, blood (routine x 2)     Status: None (Preliminary result)   Collection Time: 12/09/13 10:41 AM  Result Value Ref Range Status   Specimen Description BLOOD LEFT WRIST  Final   Special Requests BOTTLES DRAWN AEROBIC AND ANAEROBIC 5CC  Final   Culture  Setup Time   Final    12/09/2013 14:05 Performed at [facility]    Culture   Final           BLOOD CULTURE RECEIVED NO GROWTH TO DATE CULTURE WILL BE HELD FOR 5 DAYS BEFORE ISSUING A FINAL NEGATIVE REPORT Performed at [facility]    Report Status PENDING  Incomplete     Studies: No results found.  Scheduled Meds: . cefTRIAXone (ROCEPHIN)  IV  2 g Intravenous Q24H  . feeding supplement (RESOURCE BREEZE)  1 Container Oral TID BM  . ferrous sulfate  325 mg Oral BID WC  . furosemide  40 mg Oral BID  . lactulose  20 g Oral TID  . magnesium oxide  400 mg Oral Daily  . metoprolol succinate  12.5 mg Oral BID  . pantoprazole  40 mg Oral Daily  . rifaximin  550 mg Oral BID  . sodium chloride  3 mL Intravenous Q12H  . spironolactone  100 mg Oral Daily  .  ursodiol  300 mg Oral BID  . Vitamin D (Ergocalciferol)  50,000 Units Oral Q Wed   Continuous Infusions:   Antibiotics Given (last 72 hours)    Date/Time Action Medication Dose Rate   12/09/13 1459 Given   cefTRIAXone (ROCEPHIN) 2 g in dextrose 5 % 50 mL IVPB - Premix 2 g 100 mL/hr   12/09/13 2226 Given   rifaximin (XIFAXAN) tablet 550 mg 550 mg    12/10/13 1057 Given   rifaximin (XIFAXAN) tablet 550 mg 550 mg    12/10/13 1319 Given   cefTRIAXone (ROCEPHIN) 2 g in dextrose 5 % 50 mL IVPB - Premix 2 g 100 mL/hr   12/10/13 2202 Given   rifaximin (XIFAXAN) tablet 550 mg 550 mg     12/11/13 1039 Given   rifaximin (XIFAXAN) tablet 550 mg 550 mg    12/11/13 1416 Given   cefTRIAXone (ROCEPHIN) 2 g in dextrose 5 % 50 mL IVPB - Premix 2 g 100 mL/hr   12/11/13 2156 Given   rifaximin (XIFAXAN) tablet 550 mg 550 mg      Time spent: 25 min  Aniesa Boback L  [facility] Contact:  [email or web address], password [facility] 12/12/2013, 11:51 AM  LOS: 7 days

## 2013-12-13 ENCOUNTER — Encounter (HOSPITAL_COMMUNITY): Payer: Self-pay | Admitting: *Deleted

## 2013-12-13 ENCOUNTER — Encounter (HOSPITAL_COMMUNITY)
Admission: EM | Disposition: A | Discharge status: Home Health | Payer: Self-pay | Source: Home / Self Care | Attending: Internal Medicine

## 2013-12-13 DIAGNOSIS — I38 Endocarditis, valve unspecified: Secondary | ICD-10-CM

## 2013-12-13 HISTORY — PX: TEE WITHOUT CARDIOVERSION: SHX5443

## 2013-12-13 LAB — MAGNESIUM: Magnesium: 1.8 mg/dL (ref 1.5–2.5)

## 2013-12-13 LAB — CBC
HCT: 26.5 % — ABNORMAL LOW (ref 39.0–52.0)
Hemoglobin: 8.6 g/dL — ABNORMAL LOW (ref 13.0–17.0)
MCH: 30.5 pg (ref 26.0–34.0)
MCHC: 32.5 g/dL (ref 30.0–36.0)
MCV: 94 fL (ref 78.0–100.0)
Platelets: 77 10*3/uL — ABNORMAL LOW (ref 150–400)
RBC: 2.82 MIL/uL — ABNORMAL LOW (ref 4.22–5.81)
RDW: 19.4 % — AB (ref 11.5–15.5)
WBC: 6.8 10*3/uL (ref 4.0–10.5)

## 2013-12-13 LAB — BASIC METABOLIC PANEL
Anion gap: 8 (ref 5–15)
BUN: 10 mg/dL (ref 6–23)
CALCIUM: 7.7 mg/dL — AB (ref 8.4–10.5)
CO2: 27 meq/L (ref 19–32)
CREATININE: 0.97 mg/dL (ref 0.50–1.35)
Chloride: 97 mEq/L (ref 96–112)
GFR calc Af Amer: >90 mL/min (ref >90)
GFR, EST NON AFRICAN AMERICAN: 82 mL/min — AB (ref >90)
GLUCOSE: 104 mg/dL — AB (ref 70–99)
Potassium: 4.7 mEq/L (ref 3.7–5.3)
SODIUM: 132 meq/L — AB (ref 137–147)

## 2013-12-13 LAB — AMMONIA: Ammonia: 50 umol/L (ref 11–60)

## 2013-12-13 SURGERY — ECHOCARDIOGRAM, TRANSESOPHAGEAL
Anesthesia: Moderate Sedation

## 2013-12-13 MED ORDER — MIDAZOLAM HCL 5 MG/ML IJ SOLN
INTRAMUSCULAR | Status: AC
  Filled 2013-12-13: qty 2

## 2013-12-13 MED ORDER — FENTANYL CITRATE 0.05 MG/ML IJ SOLN
INTRAMUSCULAR | Status: DC | PRN
  Administered 2013-12-13: 25 ug via INTRAVENOUS

## 2013-12-13 MED ORDER — KETOROLAC TROMETHAMINE 30 MG/ML IJ SOLN
30.0000 mg | Freq: Once | INTRAMUSCULAR | Status: AC
  Administered 2013-12-13: 30 mg via INTRAVENOUS
  Filled 2013-12-13: qty 1

## 2013-12-13 MED ORDER — MIDAZOLAM HCL 10 MG/2ML IJ SOLN
INTRAMUSCULAR | Status: DC | PRN
  Administered 2013-12-13: 2 mg via INTRAVENOUS

## 2013-12-13 MED ORDER — FENTANYL CITRATE 0.05 MG/ML IJ SOLN
INTRAMUSCULAR | Status: AC
  Filled 2013-12-13: qty 2

## 2013-12-13 MED ORDER — SODIUM CHLORIDE 0.9 % IJ SOLN
10.0000 mL | INTRAMUSCULAR | Status: DC | PRN
Start: 2013-12-13 — End: 2013-12-14
  Administered 2013-12-13 – 2013-12-14 (×3): 10 mL
  Filled 2013-12-13 (×2): qty 40

## 2013-12-13 MED ORDER — SODIUM CHLORIDE 0.9 % IV BOLUS (SEPSIS)
500.0000 mL | Freq: Once | INTRAVENOUS | Status: AC
  Administered 2013-12-13: 500 mL via INTRAVENOUS

## 2013-12-13 MED ORDER — BUTAMBEN-TETRACAINE-BENZOCAINE 2-2-14 % EX AERO
INHALATION_SPRAY | CUTANEOUS | Status: DC | PRN
  Administered 2013-12-13: 2 via TOPICAL

## 2013-12-13 NOTE — H&P (View-Only) (Signed)
Consulting cardiologist:  Dr. Loralie Champagne  Seen for followup: AF/flutter, bacteremia - request for TEE  Subjective:    No chest pain or palpitations.   Objective:   Temp:  [97.8 F (36.6 C)-98.8 F (37.1 C)] 98.3 F (36.8 C) (11/28 0541) Pulse Rate:  [101-116] 102 (11/27 2201) Resp:  [17-26] 18 (11/28 0541) BP: (90-104)/(53-63) 102/62 mmHg (11/28 0600) SpO2:  [92 %-96 %] 94 % (11/28 0541) Weight:  [216 lb 4.8 oz (98.113 kg)-217 lb 2.5 oz (98.5 kg)] 217 lb 2.5 oz (98.5 kg) (11/28 0541) Last BM Date: 12/10/13  Filed Weights   12/09/13 0500 12/10/13 1950 12/11/13 0541  Weight: 213 lb 3.2 oz (96.707 kg) 216 lb 4.8 oz (98.113 kg) 217 lb 2.5 oz (98.5 kg)    Intake/Output Summary (Last 24 hours) at 12/11/13 0938 Last data filed at 12/11/13 0900  Gross per 24 hour  Intake    320 ml  Output   1290 ml  Net   -970 ml    Telemetry: Atrial fibrillation.  Exam:  General: No distress.  Lungs: Clear, nonlabored.  Cardiac: Irregular, no gallop.  Extremities: No pitting edema.   Lab Results:  Basic Metabolic Panel:  Recent Labs Lab 12/08/13 1615 12/09/13 0230 12/10/13 0235 12/11/13 0427  NA  --  133* 135* 134*  K  --  3.6* 4.7 4.7  CL  --  98 101 99  CO2  --  26 25 27   GLUCOSE  --  149* 101* 105*  BUN  --  9 10 10   CREATININE  --  0.96 0.90 0.95  CALCIUM  --  7.5* 7.6* 7.9*  MG 1.8  --   --   --     Liver Function Tests:  Recent Labs Lab 12/06/13 0630 12/09/13 0230 12/11/13 0427  AST 37 29 40*  ALT 31 17 17   ALKPHOS 156* 163* 164*  BILITOT 2.2* 1.8* 1.9*  PROT 5.2* 5.2* 5.5*  ALBUMIN 1.7* 1.6* 1.8*    CBC:  Recent Labs Lab 12/08/13 0609 12/09/13 0230 12/10/13 0235  WBC 6.2 6.7 6.7  HGB 7.7* 8.5* 8.9*  HCT 23.0* 25.5* 27.0*  MCV 93.1 92.4 93.8  PLT 71* 70* 64*    Coagulation:  Recent Labs Lab 12/05/13 1233 12/06/13 0630  INR 1.50* 1.58*     Medications:   Scheduled Medications: . cefTRIAXone (ROCEPHIN)  IV  2 g  Intravenous Q24H  . feeding supplement (RESOURCE BREEZE)  1 Container Oral TID BM  . ferrous sulfate  325 mg Oral BID WC  . furosemide  40 mg Intravenous BID  . lactulose  20 g Oral TID  . magnesium oxide  400 mg Oral Daily  . metoprolol succinate  12.5 mg Oral BID  . pantoprazole  40 mg Oral Daily  . potassium chloride SA  40 mEq Oral BID  . rifaximin  550 mg Oral BID  . sodium chloride  3 mL Intravenous Q12H  . spironolactone  100 mg Oral Daily  . ursodiol  300 mg Oral BID  . Vitamin D (Ergocalciferol)  50,000 Units Oral Q Wed     Infusions: . sodium chloride       PRN Medications:  acetaminophen **OR** acetaminophen, diphenhydrAMINE, HYDROcodone-acetaminophen, HYDROmorphone, menthol-cetylpyridinium **OR** phenol, morphine injection, temazepam   Assessment:   1. AF/flutter, continue Toprol XL for rate control. Not good candidate for anticoagulation based on chart review. Low normal to low blood pressure has limited up-titration of heart rate control somewhat. No  plan to cardiovert.  2. Strep viridans bacteremia, scheduled for TEE on Monday.  3. Suspected acute on chronic diastolic heart failure. Further clarify LV/RV function with TEE. Continues on Lasix and Aldactone.  4. Cirrhosis, no known esophageal varices.  5. Anemia and thrombocytopenia.   Plan/Discussion:    Continue Toprol XL. Holding off Amiodarone with liver disease and no plan to cardiovert. If cannot tolerate further increase in beta blocker, might consider adding digoxin if further rate control needed. For TEE on Monday as noted above.   Satira Sark, M.D., F.A.C.C.

## 2013-12-13 NOTE — Progress Notes (Signed)
Physical Therapy Treatment Patient Details Name: Clifford Cook MRN: [id number] DOB: [birth year] Today's Date: 12/13/2013    History of Present Illness pt presents post fall with L hip fx post cannulated pinning.  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    PT Comments    Progressing well.  Gait more fluid, mobility generally at supervision level  Follow Up Recommendations  Home health PT;Supervision/Assistance - 24 hour     Equipment Recommendations       Recommendations for Other Services       Precautions / Restrictions Precautions Precautions: Fall Restrictions Weight Bearing Restrictions: No LLE Weight Bearing: Weight bearing as tolerated    Mobility  Bed Mobility Overal bed mobility: Needs Assistance Bed Mobility: Supine to Sit     Supine to sit: Supervision     General bed mobility comments: no assist needed  Transfers Overall transfer level: Needs assistance Equipment used: Rolling walker (2 wheeled) Transfers: Sit to/from Stand Sit to Stand: Supervision         General transfer comment: cues for hand placement  Ambulation/Gait Ambulation/Gait assistance: Supervision Ambulation Distance (Feet): 180 Feet Assistive device: Rolling walker (2 wheeled) Gait Pattern/deviations: Step-through pattern Gait velocity: moderate speed   General Gait Details: gait still mildly antalgic at first, then more fluid   Stairs            Wheelchair Mobility    Modified Rankin (Stroke Patients Only)       Balance Overall balance assessment: Needs assistance Sitting-balance support: No upper extremity supported Sitting balance-Leahy Scale: Good     Standing balance support: No upper extremity supported Standing balance-Leahy Scale: Fair                      Cognition Arousal/Alertness: Awake/alert Behavior During Therapy: WFL for tasks assessed/performed Overall Cognitive Status: Within Functional Limits  for tasks assessed                      Exercises Total Joint Exercises Ankle Circles/Pumps: AROM;20 reps;Supine Heel Slides: AROM;Strengthening;Both;10 reps;Supine (resisted flexion/ext.) General Exercises - Lower Extremity Quad Sets: AROM;Both;10 reps;Supine Hip ABduction/ADduction: AROM;Both;10 reps;Supine Straight Leg Raises: AROM;Both;10 reps;Supine    General Comments        Pertinent Vitals/Pain Pain Assessment: 0-10 Pain Score: 5  Pain Location: L hip Pain Descriptors / Indicators: Aching Pain Intervention(s): Monitored during session    Home Living                      Prior Function            PT Goals (current goals can now be found in the care plan section) Acute Rehab PT Goals Patient Stated Goal: Go home PT Goal Formulation: With patient Time For Goal Achievement: 12/14/13 Potential to Achieve Goals: Good Progress towards PT goals: Progressing toward goals    Frequency  Min 5X/week    PT Plan Current plan remains appropriate    Co-evaluation             End of Session Equipment Utilized During Treatment: Gait belt;Oxygen Activity Tolerance: Patient tolerated treatment well Patient left: in chair;with call bell/phone within reach;with family/visitor present     Time: 7829-5621 PT Time Calculation (min) (ACUTE ONLY): 24 min  Charges:  $Gait Training: 8-22 mins $Therapeutic Exercise: 8-22 mins  G Codes:      Ulises Wolfinger, Tessie Fass 12/13/2013, 4:56 PM 12/13/2013  Donnella Sham, [place] [phone]  (pager)

## 2013-12-13 NOTE — Progress Notes (Signed)
  Echocardiogram Echocardiogram Transesophageal has been performed.  Clifford Cook 12/13/2013, 9:33 AM

## 2013-12-13 NOTE — Progress Notes (Signed)
Cardiology notified that pt has bp 75/40 dinamap 80/50 manually and 12.5 toporol scheduled instructed to not give toporol at this time. Dorothye Berni LPN

## 2013-12-13 NOTE — Progress Notes (Signed)
[name] for Infectious Disease    Date of Admission:  12/05/2013   Total days of antibiotics 9        Day 6 ceftriaxone           ID: Clifford Cook is a 68 y.o. male with viridans strep bacteremia  Principal Problem:   Bacteremia due to Streptococcus Active Problems:   H/O: GI bleed   Liver cirrhosis secondary to NASH   Ascites   Generalized weakness   Hyponatremia   Anemia   Thrombocytopenia   Coagulopathy   Hip fracture   SIRS (systemic inflammatory response syndrome)   Lactic acidosis   Atrial flutter   Encephalopathy, hepatic    Subjective: Afebrile, still having pain with ambulation from left sided fractured femoral head  Medications:  . cefTRIAXone (ROCEPHIN)  IV  2 g Intravenous Q24H  . feeding supplement (RESOURCE BREEZE)  1 Container Oral TID BM  . ferrous sulfate  325 mg Oral BID WC  . furosemide  40 mg Oral BID  . lactulose  20 g Oral TID  . magnesium oxide  400 mg Oral Daily  . metoprolol succinate  12.5 mg Oral BID  . pantoprazole  40 mg Oral Daily  . rifaximin  550 mg Oral BID  . sodium chloride  3 mL Intravenous Q12H  . spironolactone  100 mg Oral Daily  . ursodiol  300 mg Oral BID  . Vitamin D (Ergocalciferol)  50,000 Units Oral Q Wed    Objective: Vital signs in last 24 hours: Temp:  [98.1 F (36.7 C)-98.4 F (36.9 C)] 98.3 F (36.8 C) (11/30 0821) Pulse Rate:  [88-101] 94 (11/30 0938) Resp:  [6-20] 16 (11/30 0938) BP: (84-104)/(43-66) 100/55 mmHg (11/30 0944) SpO2:  [91 %-98 %] 93 % (11/30 5329) Physical Exam  Constitutional: He is oriented to person, place, and time. He appears well-developed and well-nourished. No distress.  HENT:  Mouth/Throat: Oropharynx is clear and moist. No oropharyngeal exudate.  Cardiovascular: Normal rate, regular rhythm and normal heart sounds. Exam reveals no gallop and no friction rub.  No murmur heard.  Pulmonary/Chest: Effort normal and breath sounds normal. No respiratory distress. He has no  wheezes.  Abdominal: Soft. Bowel sounds are normal. He exhibits no distension. There is no tenderness.  Lymphadenopathy:  He has no cervical adenopathy.  Neurological: He is alert and oriented to person, place, and time.  Skin: Skin is warm and dry. Scattered echymosis Ext: right arm picc line in place, c/d/i Psychiatric: He has a normal mood and affect. His behavior is normal.    Lab Results  Recent Labs  12/11/13 0427 12/13/13 0428  WBC  --  6.8  HGB  --  8.6*  HCT  --  26.5*  NA 134* 132*  K 4.7 4.7  CL 99 97  CO2 27 27  BUN 10 10  CREATININE 0.95 0.97   Liver Panel  Recent Labs  12/11/13 0427  PROT 5.5*  ALBUMIN 1.8*  AST 40*  ALT 17  ALKPHOS 164*  BILITOT 1.9*   Sedimentation Rate No results for input(s): ESRSEDRATE in the last 72 hours. C-Reactive Protein No results for input(s): CRP in the last 72 hours.  Microbiology: 11/25 blood cx x 2 ngtd 11/22 blood cx 1 of 2 viridans strep Studies/Results: Dg Chest Port 1 View  12/12/2013   CLINICAL DATA:  Initial valuation for line placement.  EXAM: PORTABLE CHEST - 1 VIEW  COMPARISON:  Prior radiograph from 12/05/2013  FINDINGS: There has been interval removal of a left PICC catheter with placement of a right PICC catheter. The tip projects over the cavoatrial junction.  Cardiac and mediastinal silhouettes are stable.  Lungs are normally inflated. Patchy left basilar opacities most likely reflect atelectasis. No overt pulmonary edema. No definite pleural effusion, although the costophrenic angles are incompletely visualized. No pneumothorax.  No acute osseous abnormality.  IMPRESSION: 1. Tip of right PICC catheter overlying the cavoatrial junction. 2. Patchy and linear left basilar opacity, favored to reflect atelectasis.   Electronically Signed   By: Jeannine Boga M.D.   On: 12/12/2013 17:55     Assessment/Plan: Strep viridans recurrent bacteremia, picc line pulled, tee negative for vegetation, no evidence  of sbp. Would treat for a total of 1 wk using 11/25 as day 1 of 14. Continue with ceftriaxone 2gm iv daily.  Will sign off  Baxter Flattery [facility] Cell: [phone] Pager: [phone]  12/13/2013, 12:15 PM

## 2013-12-13 NOTE — Interval H&P Note (Signed)
History and Physical Interval Note:  12/13/2013 9:05 AM  Clifford Cook  has presented today for surgery, with the diagnosis of rule out vegetation  The various methods of treatment have been discussed with the patient and family. After consideration of risks, benefits and other options for treatment, the patient has consented to  Procedure(s): TRANSESOPHAGEAL ECHOCARDIOGRAM (TEE) (N/A) as a surgical intervention .  The patient's history has been reviewed, patient examined, no change in status, stable for surgery.  I have reviewed the patient's chart and labs.  Questions were answered to the patient's satisfaction.     Mohamadou Maciver [employer]

## 2013-12-13 NOTE — Progress Notes (Signed)
Patient had a 26 beat run of Vtach; denies CP.  VS stable.  MD made aware.  No new orders.  Will update MD with any new changes.  Will continue to monitor patient.

## 2013-12-13 NOTE — Progress Notes (Signed)
MD notified that patient had a BP of 87/53.  He's alert and oriented.  No complaints. No new orders received.

## 2013-12-13 NOTE — CV Procedure (Signed)
Procedure: TEE  Indication: Assess for endocarditis  Sedation: Versed 2 mg IV, Fentanyl 25 mcg IV  Findings: Please see echo section for full report.  Normal LV size and systolic function, EF 88-75%.  Mildly dilated RV with normal systolic function.  Trivial MR.  No vegetation on any of the valves noted.  PICC line ends in SVC near junction with RA, no vegetation noted. Mild LAE, no LAA thrombus.  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   No endocarditis noted on this exam.   Loralie Champagne 12/13/2013 9:26 AM

## 2013-12-13 NOTE — Progress Notes (Signed)
IDAN PRIME NFA:[id number] DOB: 03/19/1945 DOA: 12/05/2013 PCP: [name], MD  Brief narrative: 68 y/o ? 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, consitutional hypotension admitted with fall + L hip fracture-found to be in Afib/flutter   Past medical history-As per Problem list Chart reviewed as below- reviewed  Consultants:   ID  Cardiology  Procedures:  TEE 11/30  Antibiotics:  Primaxin 11/22-11/24  Vancomycin 11/23-11/24  Rocephin 11/26  Rifaximin 11/23   Subjective  Alert pleasant no distress Seems comfortable Mild L hip pain No fever no chills   Objective    Interim History:   Telemetry: Tachy 90's 32 beats NSVt this am   Objective: Filed Vitals:   12/13/13 0920 12/13/13 0925 12/13/13 0938 12/13/13 0944  BP: 99/54 98/60 104/46 100/55  Pulse: 101 98 94   Temp:      TempSrc:      Resp: 18 12 16    Height:      Weight:      SpO2: 92% 93% 93%     Intake/Output Summary (Last 24 hours) at 12/13/13 1019 Last data filed at 12/13/13 0600  Gross per 24 hour  Intake    360 ml  Output   1200 ml  Net   -840 ml    Exam:  General: alert oriented in nad Cardiovascular: s1 s 2no m/r/g Respiratory:  Clear no added sound Abdomen: soft, no ascites  Skin no le edema  Neuro no asterixis  Data Reviewed: Basic Metabolic Panel:  Recent Labs Lab 12/08/13 0609 12/08/13 1615 12/09/13 0230 12/10/13 0235 12/11/13 0427 12/13/13 0428  NA 132*  --  133* 135* 134* 132*  K 4.0  --  3.6* 4.7 4.7 4.7  CL 98  --  98 101 99 97  CO2 25  --  26 25 27 27   GLUCOSE 107*  --  149* 101* 105* 104*  BUN 11  --  9 10 10 10   CREATININE 0.92  --  0.96 0.90 0.95 0.97  CALCIUM 7.5*  --  7.5* 7.6* 7.9* 7.7*  MG  --   1.8  --   --   --   --    Liver Function Tests:  Recent Labs Lab 12/09/13 0230 12/11/13 0427  AST 29 40*  ALT 17 17  ALKPHOS 163* 164*  BILITOT 1.8* 1.9*  PROT 5.2* 5.5*  ALBUMIN 1.6* 1.8*   No results for input(s): LIPASE, AMYLASE in the last 168 hours.  Recent Labs Lab 12/13/13 0428  AMMONIA 50   CBC:  Recent Labs Lab 12/06/13 1950 12/08/13 0609 12/09/13 0230 12/10/13 0235 12/13/13 0428  WBC 6.7 6.2 6.7 6.7 6.8  HGB 8.1* 7.7* 8.5* 8.9* 8.6*  HCT 24.3* 23.0* 25.5* 27.0* 26.5*  MCV 92.7 93.1 92.4 93.8 94.0  PLT 72* 71* 70* 64* 77*   Cardiac Enzymes: No results for input(s): CKTOTAL, CKMB, CKMBINDEX, TROPONINI in the last 168 hours. BNP: Invalid input(s): POCBNP CBG: No results for input(s): GLUCAP in the last 168 hours.  Recent Results (from the past 240 hour(s))  Culture, blood (routine x 2)     Status: None   Collection Time: 12/05/13  5:08 PM  Result Value Ref Range Status   Specimen Description BLOOD LEFT HAND  4 ML IN [facility]  BOTTLE  Final   Special Requests NONE  Final   Culture  Setup Time   Final    12/05/2013 21:36 Performed at [facility]    Culture   Final    NO GROWTH 5 DAYS Performed at [facility]    Report Status 12/11/2013 FINAL  Final  Culture, blood (routine x 2)     Status: None   Collection Time: 12/05/13  5:11 PM  Result Value Ref Range Status   Specimen Description BLOOD PICC  5 ML IN [facility] BOTTLE  Final   Special Requests NONE  Final   Culture  Setup Time   Final    12/05/2013 21:36 Performed at [facility]    Culture   Final    VIRIDANS STREPTOCOCCUS Note: THE SIGNIFICANCE OF ISOLATING THIS ORGANISM FROM A SINGLE SET OF BLOOD CULTURES WHEN MULTIPLE SETS ARE DRAWN IS UNCERTAIN. PLEASE NOTIFY THE MICROBIOLOGY DEPARTMENT WITHIN ONE WEEK IF SPECIATION AND SENSITIVITIES ARE REQUIRED. Note: Gram Stain Report Called to,Read Back By and Verified With: KENYAN USSERY ON 12/06/2013 AT 9:36P BY Dennard Nip Performed  at [facility]    Report Status 12/08/2013 FINAL  Final  MRSA PCR Screening     Status: None   Collection Time: 12/06/13 12:48 AM  Result Value Ref Range Status   MRSA by PCR NEGATIVE NEGATIVE Final    Comment:        The GeneXpert MRSA Assay (FDA approved for NASAL specimens only), is one component of a comprehensive MRSA colonization surveillance program. It is not intended to diagnose MRSA infection nor to guide or monitor treatment for MRSA infections.   Culture, blood (routine x 2)     Status: None (Preliminary result)   Collection Time: 12/08/13  2:53 PM  Result Value Ref Range Status   Specimen Description BLOOD RIGHT HAND  Final   Special Requests BOTTLES DRAWN AEROBIC AND ANAEROBIC 10CC  Final   Culture  Setup Time   Final    12/08/2013 21:50 Performed at [facility]    Culture   Final           BLOOD CULTURE RECEIVED NO GROWTH TO DATE CULTURE WILL BE HELD FOR 5 DAYS BEFORE ISSUING A FINAL NEGATIVE REPORT Performed at [facility]    Report Status PENDING  Incomplete  Culture, blood (routine x 2)     Status: None (Preliminary result)   Collection Time: 12/08/13 11:00 PM  Result Value Ref Range Status   Specimen Description BLOOD RIGHT ARM  Final   Special Requests BOTTLES DRAWN AEROBIC AND ANAEROBIC 10CC  Final   Culture  Setup Time   Final    12/09/2013 04:48 Performed at [facility]    Culture   Final           BLOOD CULTURE RECEIVED NO GROWTH TO DATE CULTURE WILL BE HELD FOR 5 DAYS BEFORE ISSUING A FINAL NEGATIVE REPORT Performed at [facility]    Report Status PENDING  Incomplete  Body fluid culture     Status: None   Collection Time: 12/09/13  9:18 AM  Result Value Ref Range Status   Specimen Description FLUID ASCITES  Final   Special Requests NONE  Final   Gram Stain   Final    NO WBC SEEN NO ORGANISMS SEEN Performed at [facility]    Culture   Final    NO GROWTH 3 DAYS Performed at [facility]  Report Status 12/12/2013 FINAL  Final  Culture, blood (routine x 2)     Status: None (Preliminary result)   Collection Time: 12/09/13 10:36 AM  Result Value Ref Range Status   Specimen Description BLOOD RIGHT WRIST  Final   Special Requests BOTTLES DRAWN AEROBIC AND ANAEROBIC 5CC  Final   Culture  Setup Time   Final    12/09/2013 14:04 Performed at [facility]    Culture   Final           BLOOD CULTURE RECEIVED NO GROWTH TO DATE CULTURE WILL BE HELD FOR 5 DAYS BEFORE ISSUING A FINAL NEGATIVE REPORT Performed at [facility]    Report Status PENDING  Incomplete  Culture, blood (routine x 2)     Status: None (Preliminary result)   Collection Time: 12/09/13 10:41 AM  Result Value Ref Range Status   Specimen Description BLOOD LEFT WRIST  Final   Special Requests BOTTLES DRAWN AEROBIC AND ANAEROBIC 5CC  Final   Culture  Setup Time   Final    12/09/2013 14:05 Performed at [facility]    Culture   Final           BLOOD CULTURE RECEIVED NO GROWTH TO DATE CULTURE WILL BE HELD FOR 5 DAYS BEFORE ISSUING A FINAL NEGATIVE REPORT Performed at [facility]    Report Status PENDING  Incomplete     Studies:              All Imaging reviewed and is as per above notation   Scheduled Meds: . cefTRIAXone (ROCEPHIN)  IV  2 g Intravenous Q24H  . feeding supplement (RESOURCE BREEZE)  1 Container Oral TID BM  . ferrous sulfate  325 mg Oral BID WC  . furosemide  40 mg Oral BID  . lactulose  20 g Oral TID  . magnesium oxide  400 mg Oral Daily  . metoprolol succinate  12.5 mg Oral BID  . pantoprazole  40 mg Oral Daily  . rifaximin  550 mg Oral BID  . sodium chloride  3 mL Intravenous Q12H  . spironolactone  100 mg Oral Daily  . ursodiol  300 mg Oral BID  . Vitamin D (Ergocalciferol)  50,000 Units Oral Q Wed   Continuous Infusions:    Assessment/Plan:  1. L hip # s/p mech fall-Has coagulopathy-cannot get ASa or AC.  Family wishes him to go  home, not SNf on d/c 2. Persistent strep viridans-Unclear source.  Abx narrowed to Ceftriaxone and Rifaximin.  TEE done 11/30 didn't show and veg.  Orthopantogram done previously didn't show any persisting caries.  Defer to ID duration of therapy-will probably need OP ID follow up.  Has Picc line placed 11/29 3. Aflutter/Fib-Per cardiology.  Continue Metoprolol 12.5 bid 4. Echo EF 77-93% no Daistolic parameters 5. NASH with h/o encephalopathy and cirrhosis-continue Diuretics Aldactone/Lasix 5:2 ratio.  Euvolemic and not swollen appearing.  Awake and alert-continue lactulose for 2-3 loose stools/day.  Continue Xifixan  6. AOCD-Hb stable-monitor trends-s/p 3 U P{RBc this admit.  Continue Feso4 325 daily 7. Hyponatremia-2/2 to ascites.  monitor  Code Status: full Family Communication: discussed sister, mother at bedside Disposition Plan:  inpatient   Verneita Griffes, MD  [facility] Pager [phone] 12/13/2013, 10:19 AM    LOS: 8 days

## 2013-12-13 NOTE — Progress Notes (Addendum)
Subjective: No CP or SOB  Objective: Vital signs in last 24 hours: Temp:  [98.1 F (36.7 C)-98.4 F (36.9 C)] 98.3 F (36.8 C) (11/30 0821) Pulse Rate:  [88-101] 98 (11/30 0925) Resp:  [6-20] 12 (11/30 0925) BP: (84-103)/(43-66) 98/60 mmHg (11/30 0925) SpO2:  [91 %-98 %] 93 % (11/30 0925) Last BM Date: 12/11/13  Intake/Output from previous day: 11/29 0701 - 11/30 0700 In: 840 [P.O.:840] Out: 1200 [Urine:1200] Intake/Output this shift:    Medications Current Facility-Administered Medications  Medication Dose Route Frequency Provider Last Rate Last Dose  . acetaminophen (TYLENOL) tablet 650 mg  650 mg Oral Q6H PRN Renette Butters, MD       Or  . acetaminophen (TYLENOL) suppository 650 mg  650 mg Rectal Q6H PRN Renette Butters, MD      . cefTRIAXone (ROCEPHIN) 2 g in dextrose 5 % 50 mL IVPB - Premix  2 g Intravenous Q24H Carlyle Basques, MD   2 g at 12/12/13 1354  . diphenhydrAMINE (BENADRYL) capsule 25 mg  25 mg Oral Q8H PRN Orvan Falconer, MD   25 mg at 12/12/13 2334  . feeding supplement (RESOURCE BREEZE) (RESOURCE BREEZE) liquid 1 Container  1 Container Oral TID BM Costin Karlyne Greenspan, MD   1 Container at 12/09/13 [phone]  . ferrous sulfate tablet 325 mg  325 mg Oral BID WC Caren Griffins, MD   325 mg at 12/13/13 0717  . furosemide (LASIX) tablet 40 mg  40 mg Oral BID Delfina Redwood, MD   40 mg at 12/13/13 0717  . HYDROmorphone (DILAUDID) tablet 1-2 mg  1-2 mg Oral Q6H PRN Delfina Redwood, MD      . lactulose (CHRONULAC) 10 GM/15ML solution 20 g  20 g Oral TID Caren Griffins, MD   20 g at 12/12/13 2333  . magnesium oxide (MAG-OX) tablet 400 mg  400 mg Oral Daily Costin Karlyne Greenspan, MD   400 mg at 12/12/13 1100  . menthol-cetylpyridinium (CEPACOL) lozenge 3 mg  1 lozenge Oral PRN Renette Butters, MD       Or  . phenol (CHLORASEPTIC) mouth spray 1 spray  1 spray Mouth/Throat PRN Renette Butters, MD      . metoprolol succinate (TOPROL-XL) 24 hr tablet 12.5 mg  12.5 mg Oral  BID Gardiner Barefoot, NP   12.5 mg at 12/12/13 1154  . morphine 2 MG/ML injection 0.5 mg  0.5 mg Intravenous Q2H PRN Renette Butters, MD      . oxyCODONE (Oxy IR/ROXICODONE) immediate release tablet 5 mg  5 mg Oral Q6H PRN Lacy Duverney, PA-C      . pantoprazole (PROTONIX) EC tablet 40 mg  40 mg Oral Daily Costin Karlyne Greenspan, MD   40 mg at 12/12/13 1100  . rifaximin (XIFAXAN) tablet 550 mg  550 mg Oral BID Geradine Girt, DO   550 mg at 12/12/13 2334  . sodium chloride 0.9 % injection 10-40 mL  10-40 mL Intracatheter PRN Delfina Redwood, MD   10 mL at 12/13/13 0429  . sodium chloride 0.9 % injection 3 mL  3 mL Intravenous Q12H Caren Griffins, MD   3 mL at 12/12/13 2335  . spironolactone (ALDACTONE) tablet 100 mg  100 mg Oral Daily Geradine Girt, DO   100 mg at 12/12/13 1100  . temazepam (RESTORIL) capsule 7.5 mg  7.5 mg Oral QHS PRN Caren Griffins, MD   7.5 mg at 12/05/13  2329  . ursodiol (ACTIGALL) capsule 300 mg  300 mg Oral BID Caren Griffins, MD   300 mg at 12/12/13 2332  . Vitamin D (Ergocalciferol) (DRISDOL) capsule 50,000 Units  50,000 Units Oral Q Wed Caren Griffins, MD   50,000 Units at 12/08/13 0957    PE: General appearance: alert, cooperative and no distress Lungs: clear to auscultation bilaterally Heart: irregularly irregular rhythm and no MRG Extremities: Trace left LEE Pulses: 2+ and symmetric Skin: WArm and dry Neurologic: Grossly normal  Lab Results:   Recent Labs  12/13/13 0428  WBC 6.8  HGB 8.6*  HCT 26.5*  PLT 77*   BMET  Recent Labs  12/11/13 0427 12/13/13 0428  NA 134* 132*  K 4.7 4.7  CL 99 97  CO2 27 27  GLUCOSE 105* 104*  BUN 10 10  CREATININE 0.95 0.97  CALCIUM 7.9* 7.7*   TEE results: Findings: Please see echo section for full report. Normal LV size and systolic function, EF 16-60%. Mildly dilated RV with normal systolic function. Trivial MR. No vegetation on any of the valves noted. PICC line ends in SVC near junction with  RA, no vegetation noted. Mild LAE, no LAA thrombus. 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   Assessment/Plan    1. AF/flutter,  Continues in afib.  Rate 90's. On Toprol XL 12.5mg  for rate control.  BP soft.   Not good candidate for anticoagulation based on chart review-Fall risk. Low normal to low blood pressure has limited up-titration of heart rate control somewhat. No plan to cardiovert according to previous notes.  2. NSVT 32 beats at ~0400hrs this morning.   Resembles torsades.  Will check Mag.  QTC 496ms on last EKG(11/25).  His BP will not tolerate BB increase.  Isolated episode?  Not a good Amiodarone candidate with liver disease.  2.5  Strep viridans bacteremia,   SP TEE this morning. No vegetations found.  No LAA thrombus. atrial septal aneurysm with a PFO.   3. Suspected acute on chronic diastolic heart failure.   EF 55-60%  Continues on Lasix 40mg  BID-PO and Aldactone.  Net fluids: -0.4L/-3.8L  4. Cirrhosis, no known esophageal varices.  5. Anemia and thrombocytopenia.  Stable   LOS: 8 days    HAGER, BRYAN PA-C 12/13/2013 9:35 AM  As above; patient seen and examined; denies CP or dyspnea; telemetry reviewed and there appears to be artifact and not 30 beats of NSVT; continue toprol for rate control of atrial fibrillation; not a candidate for anticoagulation; continue present dose of diuretics; TEE with no vegetations; fu Dr Aundra Dubin following DC. Kirk Ruths

## 2013-12-14 ENCOUNTER — Encounter (HOSPITAL_COMMUNITY): Payer: Self-pay | Admitting: Cardiology

## 2013-12-14 ENCOUNTER — Encounter: Payer: Self-pay | Admitting: Family Medicine

## 2013-12-14 LAB — CBC WITH DIFFERENTIAL/PLATELET
Basophils Absolute: 0.1 10*3/uL (ref 0.0–0.1)
Basophils Relative: 2 % — ABNORMAL HIGH (ref 0–1)
Eosinophils Absolute: 0.7 10*3/uL (ref 0.0–0.7)
Eosinophils Relative: 10 % — ABNORMAL HIGH (ref 0–5)
HCT: 25.3 % — ABNORMAL LOW (ref 39.0–52.0)
Hemoglobin: 8.1 g/dL — ABNORMAL LOW (ref 13.0–17.0)
LYMPHS ABS: 1.1 10*3/uL (ref 0.7–4.0)
Lymphocytes Relative: 17 % (ref 12–46)
MCH: 30.3 pg (ref 26.0–34.0)
MCHC: 32 g/dL (ref 30.0–36.0)
MCV: 94.8 fL (ref 78.0–100.0)
MONOS PCT: 12 % (ref 3–12)
Monocytes Absolute: 0.8 10*3/uL (ref 0.1–1.0)
NEUTROS PCT: 60 % (ref 43–77)
Neutro Abs: 4 10*3/uL (ref 1.7–7.7)
Platelets: 67 10*3/uL — ABNORMAL LOW (ref 150–400)
RBC: 2.67 MIL/uL — AB (ref 4.22–5.81)
RDW: 19.9 % — ABNORMAL HIGH (ref 11.5–15.5)
WBC: 6.7 10*3/uL (ref 4.0–10.5)

## 2013-12-14 LAB — COMPREHENSIVE METABOLIC PANEL
ALT: 15 U/L (ref 0–53)
AST: 29 U/L (ref 0–37)
Albumin: 1.6 g/dL — ABNORMAL LOW (ref 3.5–5.2)
Alkaline Phosphatase: 144 U/L — ABNORMAL HIGH (ref 39–117)
Anion gap: 12 (ref 5–15)
BILIRUBIN TOTAL: 1.6 mg/dL — AB (ref 0.3–1.2)
BUN: 13 mg/dL (ref 6–23)
CHLORIDE: 94 meq/L — AB (ref 96–112)
CO2: 25 meq/L (ref 19–32)
CREATININE: 1.16 mg/dL (ref 0.50–1.35)
Calcium: 7.4 mg/dL — ABNORMAL LOW (ref 8.4–10.5)
GFR calc Af Amer: 72 mL/min — ABNORMAL LOW (ref >90)
GFR, EST NON AFRICAN AMERICAN: 62 mL/min — AB (ref >90)
GLUCOSE: 139 mg/dL — AB (ref 70–99)
Potassium: 3.8 mEq/L (ref 3.7–5.3)
Sodium: 131 mEq/L — ABNORMAL LOW (ref 137–147)
Total Protein: 5 g/dL — ABNORMAL LOW (ref 6.0–8.3)

## 2013-12-14 LAB — CULTURE, BLOOD (ROUTINE X 2): Culture: NO GROWTH

## 2013-12-14 LAB — PROTIME-INR
INR: 1.66 — AB (ref 0.00–1.49)
Prothrombin Time: 19.7 seconds — ABNORMAL HIGH (ref 11.6–15.2)

## 2013-12-14 MED ORDER — METOPROLOL SUCCINATE 12.5 MG HALF TABLET: 12.5000 mg | ORAL_TABLET | Freq: Every day | ORAL | Status: DC

## 2013-12-14 MED ORDER — CEFTRIAXONE SODIUM IN DEXTROSE 40 MG/ML IV SOLN: 2.0000 g | INTRAVENOUS | Status: DC

## 2013-12-14 MED ORDER — FUROSEMIDE 20 MG PO TABS: 20.0000 mg | ORAL_TABLET | Freq: Every day | ORAL | Status: DC

## 2013-12-14 MED ORDER — SPIRONOLACTONE 50 MG PO TABS
50.0000 mg | ORAL_TABLET | Freq: Every day | ORAL | Status: DC
  Administered 2013-12-14: 50 mg via ORAL
  Filled 2013-12-14: qty 1

## 2013-12-14 MED ORDER — FUROSEMIDE 20 MG PO TABS
20.0000 mg | ORAL_TABLET | Freq: Every day | ORAL | Status: DC
  Administered 2013-12-14: 20 mg via ORAL

## 2013-12-14 MED ORDER — RIFAXIMIN 550 MG PO TABS: 550.0000 mg | ORAL_TABLET | Freq: Two times a day (BID) | ORAL | Status: DC

## 2013-12-14 MED ORDER — SPIRONOLACTONE 50 MG PO TABS: 50.0000 mg | ORAL_TABLET | Freq: Every day | ORAL | Status: DC

## 2013-12-14 MED ORDER — OXYCODONE HCL 5 MG PO TABS: 5.0000 mg | ORAL_TABLET | Freq: Four times a day (QID) | ORAL | Status: DC | PRN

## 2013-12-14 MED ORDER — HEPARIN SOD (PORK) LOCK FLUSH 100 UNIT/ML IV SOLN
250.0000 [IU] | INTRAVENOUS | Status: AC | PRN
  Administered 2013-12-14: 250 [IU]

## 2013-12-14 MED ORDER — FUROSEMIDE 20 MG PO TABS
20.0000 mg | ORAL_TABLET | Freq: Every day | ORAL | Status: DC
  Filled 2013-12-14: qty 1

## 2013-12-14 NOTE — Progress Notes (Signed)
Order for IV team consult for potential problem with right midline PICC. IV team came to the floor and saw that another RN had drawn blood from site. She was informed that some blood was leaking around the site she stated that sometimes that happens with new PICC lines. On coming RN was informed. Arthor Captain LPN

## 2013-12-14 NOTE — Discharge Summary (Signed)
Physician Discharge Summary  Clifford Cook OMV:[id number] DOB: May 26, 1945 DOA: 12/05/2013  PCP: [name], MD  Admit date: 12/05/2013 Discharge date: 12/14/2013  Time spent: 35 minutes  Recommendations for Outpatient Follow-up:  1. Will need close re-adjustment of diurectics and beta blocker-Ideally would consider non-selective BB 2. Complete Ceftriaxone 12/21/13-would also remove PICC line as per [facility] RN 3. 2 Gm salt diet 4. Needs Cmet/INR and Cbc ion about 1 week 5. Needs every other day weights and Diuretics to be adjusted and increased if gains more than 3 pounds in 24-48 hours time 6. Patient to get maximal Home health RN PT OT 7. Limited amount of pain meds Rx 8. Consider Dental extractions as per Dr. Evelene Croon his dentist-letter of medical necessity given   Discharge Diagnoses:  Principal Problem:   Bacteremia due to Streptococcus Active Problems:   H/O: GI bleed   Liver cirrhosis secondary to NASH   Ascites   Generalized weakness   Hyponatremia   Anemia   Thrombocytopenia   Coagulopathy   Hip fracture   SIRS (systemic inflammatory response syndrome)   Lactic acidosis   Atrial flutter   Encephalopathy, hepatic   Discharge Condition: fari  Diet recommendation:  regular  Filed Weights   12/10/13 1950 12/11/13 0541 12/14/13 0541  Weight: 98.113 kg (216 lb 4.8 oz) 98.5 kg (217 lb 2.5 oz) 96.2 kg (212 lb 1.3 oz)    History of present illness:  68 y/o ? 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 L hip # s/p mech  fall-Has coagulopathy-cannot get ASa or AC. Family wishes him to go home, not SNF on d/c-Repair was done on 12/06/13.  Will need cautious use of Oxycodone-I have indicated to him tylenol probably not a good option and if breakthrough pain, use low dose NSAIDS 2. Persistent strep viridans-Per ID 2/2 to possible caries . Abx narrowed to Ceftriaxone and Rifaximin. TEE done 11/30 didn't show and veg. Orthopantogram done previously didn't show any persisting caries. Need Ceftriaxone till 12/21/13 per ID-I have written a letter of medical necessity to have teeth removed Has Picc line placed 11/29-To be removed by [facility] RN 3. Aflutter/Fib-Per cardiology. Continue Metoprolol 12.5 bid 4. Echo EF 62-83% no Diastolic parameters 5. NASH with h/o encephalopathy and cirrhosis-continue Diuretics Aldactone/Lasix 5:2 ratio.  downard adjusted dose as below from 100:40  Euvolemic and not swollen appearing. Awake and alert-continue lactulose/sorbitol for 2-3 loose stools/day. Continue Xifixan  6. AOCD with superimposed anemia 2/2 to Acute blood lsoss from surgery-Hb stable-monitor trends-s/p 3 U PRBc this admit. Continue Feso4 325 daily 7. Hyponatremia-2/2 to ascites. monitor  Procedures:  TEE 11/30  L hip fracture repair Consultations:  ID  Cardiology  Discharge Exam: Filed Vitals:   12/14/13 0805  BP: 88/45  Pulse: 89  Temp:   Resp:    Alert pleasant mentation well No issues curretly General: EOMI, NCAT Cardiovascular: s1 s 2 no m/r/g Respiratory: clear no added sound  Discharge Instructions You were cared for by a hospitalist during your hospital stay. If you have any questions about your discharge  medications or the care you received while you were in the hospital after you are discharged, you can call the unit and asked to speak with the hospitalist on call if the hospitalist that took care of you is not available. Once you are discharged, your primary care physician will handle any  further medical issues. Please note that NO REFILLS for any discharge medications will be authorized once you are discharged, as it is imperative that you return to your primary care physician (or establish a relationship with a primary care physician if you do not have one) for your aftercare needs so that they can reassess your need for medications and monitor your lab values.  Discharge Instructions    Diet - low sodium heart healthy    Complete by:  As directed      Discharge instructions    Complete by:  As directed   You will complete 1 more week of Ceftriaxone IV and then stop-Your GI doc or PCP can then manage your care Infectious disease MD has indicated that they thought that your source of infection was your teeth.  Please visit a dentist to ensure that we can get prompt medical attention for this-I will write a letter of medical necessity for you to get this done-please ensure your PCP can follow-up with this if needed I have downwards  Adjusted some of your blood pressure meds and your diuretics-suggest you speak with Gastroenterology about this.  If worsening fluid status then please contact GI -The mian thing is fluid and more importantly less than 2 grams salt daily     Increase activity slowly    Complete by:  As directed           Current Discharge Medication List    START taking these medications   Details  cefTRIAXone (ROCEPHIN) 40 MG/ML IVPB Inject 50 mLs (2 g total) into the vein daily. Qty: 50 mL, Refills: 0    docusate sodium (COLACE) 100 MG capsule Take 1 capsule (100 mg total) by mouth 2 (two) times daily. Qty: 60 capsule, Refills: 0    HYDROcodone-acetaminophen (NORCO) 5-325 MG per tablet Take 1-2 tablets by mouth every 4 (four) hours as needed for moderate pain. Qty: 90 tablet, Refills: 0    metoprolol succinate (TOPROL-XL) 12.5 mg TB24 24 hr tablet Take 0.5 tablets (12.5 mg total) by mouth daily. Qty: 30 each, Refills: 0    oxyCODONE (OXY IR/ROXICODONE) 5  MG immediate release tablet Take 1 tablet (5 mg total) by mouth every 6 (six) hours as needed for moderate pain. Qty: 30 tablet, Refills: 0    rifaximin (XIFAXAN) 550 MG TABS tablet Take 1 tablet (550 mg total) by mouth 2 (two) times daily. Qty: 60 tablet, Refills: 0      CONTINUE these medications which have CHANGED   Details  furosemide (LASIX) 20 MG tablet Take 1 tablet (20 mg total) by mouth daily. Qty: 30 tablet, Refills: 0    spironolactone (ALDACTONE) 50 MG tablet Take 1 tablet (50 mg total) by mouth daily. Qty: 30 tablet, Refills: 0      CONTINUE these medications which have NOT CHANGED   Details  alendronate (FOSAMAX) 70 MG tablet Take 1 tablet (70 mg total) by mouth once a week. Hold until antibiotic therapy is completed    diphenhydrAMINE (BENADRYL) 25 mg capsule Take 1 capsule (25 mg total) by mouth every 12 (twelve) hours as needed for itching. Qty: 30 capsule, Refills: 0    ferrous sulfate  325 (65 FE) MG tablet Take 1 tablet (325 mg total) by mouth 2 (two) times daily with a meal. Refills: 3    lactulose (CHRONULAC) 10 GM/15ML solution Take 20 g by mouth 3 (three) times daily.     Magnesium 250 MG TABS Take 250 mg by mouth daily.    pantoprazole (PROTONIX) 40 MG tablet Take 1 tablet (40 mg total) by mouth daily.    sorbitol 70 % SOLN Take 20 mLs by mouth daily as needed for moderate constipation (goal is 1-2 good BM's daily).    temazepam (RESTORIL) 7.5 MG capsule Take 1 capsule (7.5 mg total) by mouth at bedtime as needed for sleep. Qty: 30 capsule, Refills: 0    ursodiol (ACTIGALL) 500 MG tablet Take 250 mg by mouth 2 (two) times daily.    feeding supplement, RESOURCE BREEZE, (RESOURCE BREEZE) LIQD Take 1 Container by mouth 3 (three) times daily between meals. Refills: 0    potassium chloride 20 MEQ/15ML (10%) solution Take 40 mEq by mouth daily.      STOP taking these medications     HYDROmorphone (DILAUDID) 2 MG tablet      potassium chloride SA  (K-DUR,KLOR-CON) 20 MEQ tablet      traMADol (ULTRAM) 50 MG tablet      Vitamin D, Ergocalciferol, (DRISDOL) 50000 UNITS CAPS capsule        Allergies  Allergen Reactions  . Pulmicort [Budesonide] Shortness Of Breath    Causes stridor  . Penicillins Other (See Comments)    Rash hives, white spots.   Follow-up Information    Follow up with MURPHY, TIMOTHY, D, MD In 1 week.   Specialty:  Orthopedic Surgery   Contact information:   [address] [place] [address] [phone]       Follow up with [facility].   Why:  active for [place] will resume   Contact information:   [address] [place] [address] [phone]        The results of significant diagnostics from this hospitalization (including imaging, microbiology, ancillary and laboratory) are listed below for reference.    Significant Diagnostic Studies: Dg Orthopantogram  12/08/2013   CLINICAL DATA:  Bacteremia  EXAM: ORTHOPANTOGRAM/PANORAMIC  COMPARISON:  None.  FINDINGS: There appears to be caries involvement of the left upper tooth # 12, i.e. first bicuspid. Also several teeth in the parasymphysis of the mandible are broken at the level of the bone of the mandible i.e. # 23 lateral incisor, # 22 lateral incisor, and #21 first bicuspid. Caries involvement of these teeth cannot be excluded. No fracture of the mandible is seen. The mandibular condyles appear to be normally position. Multiple teeth are missing.  IMPRESSION: Caries involvement as noted above with several teeth which are broken at the level of the bone of the lower mandible.   Electronically Signed   By: Ivar Drape M.D.   On: 12/08/2013 15:51   Dg Chest 2 View  11/15/2013   CLINICAL DATA:  Dyspnea ; bilateral lower extremity edema  EXAM: CHEST  2 VIEW  COMPARISON:  November 04, 2013  FINDINGS: There has been marked interval resolution of pulmonary edema. Currently there is evidence of underlying emphysema  with scattered areas of scarring bilaterally. There is currently trace interstitial edema and no consolidation. Heart is upper normal in size. The pulmonary vascularity reflects underlying emphysema. There is a minimal right effusion. No adenopathy. Central catheter tip in superior vena  cava. No pneumothorax.  IMPRESSION: Significant interval resolution of pulmonary edema compared to prior study. Currently there is trace edema with heart upper normal in size and minimal right effusion. There is felt to be mild congestive heart failure superimposed on emphysema currently. No consolidation. Central catheter tip in superior vena cava. No pneumothorax.   Electronically Signed   By: Lowella Grip M.D.   On: 11/15/2013 13:29   Dg Hip Complete Left  12/05/2013   CLINICAL DATA:  Fall 2 days ago on left side. Left posterior left upper leg pain radiating to groin area. Bruising. Initial encounter.  EXAM: LEFT HIP - COMPLETE 2+ VIEW  COMPARISON:  None.  FINDINGS: There is a left femoral neck fracture. Mild valgus angulation. No subluxation or dislocation. Mild degenerative changes in the hips bilaterally.  IMPRESSION: Left femoral neck fracture with mild valgus angulation.   Electronically Signed   By: Rolm Baptise M.D.   On: 12/05/2013 14:11   Dg Hip Operative Left  12/06/2013   CLINICAL DATA:  Left femoral neck fracture.  EXAM: OPERATIVE LEFT HIP  COMPARISON:  Radiographs dated 12/05/2013  FINDINGS: AP and lateral C-arm images demonstrate 3 pins have been inserted across the left femoral neck fracture. Alignment and position of the fracture fragments is near anatomic.  IMPRESSION: Open reduction and internal fixation of left femoral neck fracture.   Electronically Signed   By: Rozetta Nunnery M.D.   On: 12/06/2013 18:07   Dg Femur Left  12/05/2013   CLINICAL DATA:  Fall onto left side.  Left hip and groin pain.  EXAM: LEFT FEMUR - 2 VIEW  COMPARISON:  Left hip series performed today.  FINDINGS: There is a left  femoral neck fracture. Mild valgus angulation. No subluxation or dislocation. No additional femoral abnormality.  IMPRESSION: Left femoral neck fracture with mild valgus angulation.   Electronically Signed   By: Rolm Baptise M.D.   On: 12/05/2013 14:11   Pelvis Portable  12/06/2013   CLINICAL DATA:  Status post cannulated hip pinning  EXAM: PORTABLE PELVIS 1-2 VIEWS  COMPARISON:  12/05/2013  FINDINGS: Three Knowles pins have then used to reduce the left subcapital femoral neck fracture. The fracture fragments are in anatomic alignment. There is no complication identified.  IMPRESSION: 1. Status post ORIF of left hip fracture.   Electronically Signed   By: Kerby Moors M.D.   On: 12/06/2013 23:13   US Paracentesis  12/09/2013   CLINICAL DATA:  Request for paracentesis, diagnostic only.  EXAM: ULTRASOUND GUIDED PARACENTESIS  COMPARISON:  Previous paracentesis  PROCEDURE: An ultrasound guided paracentesis was thoroughly discussed with the patient and questions answered. The benefits, risks, alternatives and complications were also discussed. The patient understands and wishes to proceed with the procedure. Written consent was obtained.  Ultrasound was performed to localize and mark an adequate pocket of fluid in the right lower quadrant of the abdomen. The area was then prepped and draped in the normal sterile fashion. 1% Lidocaine was used for local anesthesia. Under ultrasound guidance a 19 gauge Yueh catheter was introduced. Paracentesis was performed. The catheter was removed and a dressing applied.  COMPLICATIONS: None immediate  FINDINGS: Moderate volume of ascites is present.  A total of approximately 120 mL of clear yellow fluid was removed. A fluid sample was sent for laboratory analysis.  IMPRESSION: Successful ultrasound guided paracentesis yielding 120 mL of ascites.  Read by: Ascencion Dike PA-C   Electronically Signed   By: Tamera Punt.D.  On: 12/09/2013 10:27   US  Paracentesis  11/25/2013   CLINICAL DATA:  Cirrhosis, recurrent ascites. Request is made for therapeutic paracentesis.  EXAM: ULTRASOUND GUIDED PARACENTESIS  COMPARISON:  Previous paracentesis  PROCEDURE: An ultrasound guided paracentesis was thoroughly discussed with the patient and questions answered. The benefits, risks, alternatives and complications were also discussed. The patient understands and wishes to proceed with the procedure. Written consent was obtained.  Ultrasound was performed to localize and mark an adequate pocket of fluid in the right lower quadrant of the abdomen. The area was then prepped and draped in the normal sterile fashion. 1% Lidocaine was used for local anesthesia. Under ultrasound guidance a 6 French Safe-T-Centesis catheter was introduced. Paracentesis was performed. The catheter was removed and a dressing applied. Dermabond was used to form a puncture site seal, as the patient has significant soft tissue edema that is weeping.  COMPLICATIONS: None.  FINDINGS: A total of approximately 1.7 L of clear yellow fluid was removed. A fluid sample was not sent for laboratory analysis.  IMPRESSION: Successful ultrasound guided paracentesis yielding 1.7 L of ascites.  Read by: Ascencion Dike PA-C   Electronically Signed   By: Corrie Mckusick D.O.   On: 11/25/2013 13:57   US Paracentesis  11/15/2013   INDICATION: Cirrhosis, recurrent ascites. Request is made for diagnostic and therapeutic paracentesis.  EXAM: ULTRASOUND-GUIDED DIAGNOSTIC AND THERAPEUTIC PARACENTESIS  COMPARISON:  None.  MEDICATIONS: None.  COMPLICATIONS: None immediate  TECHNIQUE: Informed written consent was obtained from the patient after a discussion of the risks, benefits and alternatives to treatment. A timeout was performed prior to the initiation of the procedure.  Initial ultrasound scanning demonstrates a moderate amount of ascites within the right lower abdominal quadrant. The right lower abdomen was prepped and  draped in the usual sterile fashion. 1% lidocaine was used for local anesthesia. Under direct ultrasound guidance, a 19 gauge, 7-cm, Yueh catheter was introduced. An ultrasound image was saved for documentation purposed. The paracentesis was performed. The catheter was removed and a dressing was applied. The patient tolerated the procedure well without immediate post procedural complication.  FINDINGS: A total of approximately 4 liters of slightly turbid, light yellow fluid was removed. Samples were sent to the laboratory as requested by the clinical team.  IMPRESSION: Successful ultrasound-guided diagnostic and therapeutic paracentesis yielding 4 liters of peritoneal fluid.  Read by: Rowe Robert, PA-C   Electronically Signed   By: Maryclare Bean M.D.   On: 11/15/2013 16:40   Dg Chest Port 1 View  12/12/2013   CLINICAL DATA:  Initial valuation for line placement.  EXAM: PORTABLE CHEST - 1 VIEW  COMPARISON:  Prior radiograph from 12/05/2013  FINDINGS: There has been interval removal of a left PICC catheter with placement of a right PICC catheter. The tip projects over the cavoatrial junction.  Cardiac and mediastinal silhouettes are stable.  Lungs are normally inflated. Patchy left basilar opacities most likely reflect atelectasis. No overt pulmonary edema. No definite pleural effusion, although the costophrenic angles are incompletely visualized. No pneumothorax.  No acute osseous abnormality.  IMPRESSION: 1. Tip of right PICC catheter overlying the cavoatrial junction. 2. Patchy and linear left basilar opacity, favored to reflect atelectasis.   Electronically Signed   By: Jeannine Boga M.D.   On: 12/12/2013 17:55   Dg Chest Port 1 View  12/05/2013   CLINICAL DATA:  Tachycardia, hip fracture  EXAM: PORTABLE CHEST - 1 VIEW  COMPARISON:  None.  FINDINGS: Normal cardiac silhouette.  There is a left PICC line with tip in distal SVC. Chronic bronchitic markings are noted. No pulmonary edema or pneumothorax.  Mild left basilar atelectasis.  IMPRESSION: No interval change. Chronic bronchitic markings and left basilar atelectasis.   Electronically Signed   By: Suzy Bouchard M.D.   On: 12/05/2013 16:39   Dg Chest Port 1 View  11/25/2013   CLINICAL DATA:  Weakness and shortness of breath.  EXAM: PORTABLE CHEST - 1 VIEW  COMPARISON:  PA and lateral chest 11/15/2013.  FINDINGS: Small bilateral pleural effusions on the comparison study of increased. Bilateral interstitial opacities are identified. No pneumothorax is seen. Heart size is normal.  IMPRESSION: Bilateral interstitial opacities could be due to edema or infection.  Some increase in small bilateral pleural effusions, greater on the left.   Electronically Signed   By: Inge Rise M.D.   On: 11/25/2013 03:09    Microbiology: Recent Results (from the past 240 hour(s))  Culture, blood (routine x 2)     Status: None   Collection Time: 12/05/13  5:08 PM  Result Value Ref Range Status   Specimen Description BLOOD LEFT HAND  4 ML IN [facility] BOTTLE  Final   Special Requests NONE  Final   Culture  Setup Time   Final    12/05/2013 21:36 Performed at [facility]   Final    NO GROWTH 5 DAYS Performed at [facility]    Report Status 12/11/2013 FINAL  Final  Culture, blood (routine x 2)     Status: None   Collection Time: 12/05/13  5:11 PM  Result Value Ref Range Status   Specimen Description BLOOD PICC  5 ML IN [facility] BOTTLE  Final   Special Requests NONE  Final   Culture  Setup Time   Final    12/05/2013 21:36 Performed at [facility]    Culture   Final    VIRIDANS STREPTOCOCCUS Note: THE SIGNIFICANCE OF ISOLATING THIS ORGANISM FROM A SINGLE SET OF BLOOD CULTURES WHEN MULTIPLE SETS ARE DRAWN IS UNCERTAIN. PLEASE NOTIFY THE MICROBIOLOGY DEPARTMENT WITHIN ONE WEEK IF SPECIATION AND SENSITIVITIES ARE REQUIRED. Note: Gram Stain Report Called to,Read Back By and Verified With: KENYAN USSERY ON 12/06/2013 AT  9:36P BY Dennard Nip Performed at [facility]    Report Status 12/08/2013 FINAL  Final  MRSA PCR Screening     Status: None   Collection Time: 12/06/13 12:48 AM  Result Value Ref Range Status   MRSA by PCR NEGATIVE NEGATIVE Final    Comment:        The GeneXpert MRSA Assay (FDA approved for NASAL specimens only), is one component of a comprehensive MRSA colonization surveillance program. It is not intended to diagnose MRSA infection nor to guide or monitor treatment for MRSA infections.   Culture, blood (routine x 2)     Status: None   Collection Time: 12/08/13  2:53 PM  Result Value Ref Range Status   Specimen Description BLOOD RIGHT HAND  Final   Special Requests BOTTLES DRAWN AEROBIC AND ANAEROBIC 10CC  Final   Culture  Setup Time   Final    12/08/2013 21:50 Performed at [facility]   Final    NO GROWTH 5 DAYS Performed at [facility]    Report Status 12/14/2013 FINAL  Final  Culture, blood (routine x 2)     Status: None (Preliminary result)   Collection Time: 12/08/13 11:00  PM  Result Value Ref Range Status   Specimen Description BLOOD RIGHT ARM  Final   Special Requests BOTTLES DRAWN AEROBIC AND ANAEROBIC 10CC  Final   Culture  Setup Time   Final    12/09/2013 04:48 Performed at [facility]    Culture   Final           BLOOD CULTURE RECEIVED NO GROWTH TO DATE CULTURE WILL BE HELD FOR 5 DAYS BEFORE ISSUING A FINAL NEGATIVE REPORT Performed at [facility]    Report Status PENDING  Incomplete  Body fluid culture     Status: None   Collection Time: 12/09/13  9:18 AM  Result Value Ref Range Status   Specimen Description FLUID ASCITES  Final   Special Requests NONE  Final   Gram Stain   Final    NO WBC SEEN NO ORGANISMS SEEN Performed at [facility]    Culture   Final    NO GROWTH 3 DAYS Performed at [facility]    Report Status 12/12/2013 FINAL  Final  Culture, blood (routine x 2)      Status: None (Preliminary result)   Collection Time: 12/09/13 10:36 AM  Result Value Ref Range Status   Specimen Description BLOOD RIGHT WRIST  Final   Special Requests BOTTLES DRAWN AEROBIC AND ANAEROBIC 5CC  Final   Culture  Setup Time   Final    12/09/2013 14:04 Performed at [facility]    Culture   Final           BLOOD CULTURE RECEIVED NO GROWTH TO DATE CULTURE WILL BE HELD FOR 5 DAYS BEFORE ISSUING A FINAL NEGATIVE REPORT Performed at [facility]    Report Status PENDING  Incomplete  Culture, blood (routine x 2)     Status: None (Preliminary result)   Collection Time: 12/09/13 10:41 AM  Result Value Ref Range Status   Specimen Description BLOOD LEFT WRIST  Final   Special Requests BOTTLES DRAWN AEROBIC AND ANAEROBIC 5CC  Final   Culture  Setup Time   Final    12/09/2013 14:05 Performed at [facility]    Culture   Final           BLOOD CULTURE RECEIVED NO GROWTH TO DATE CULTURE WILL BE HELD FOR 5 DAYS BEFORE ISSUING A FINAL NEGATIVE REPORT Performed at [facility]    Report Status PENDING  Incomplete     Labs: Basic Metabolic Panel:  Recent Labs Lab 12/08/13 1615 12/09/13 0230 12/10/13 0235 12/11/13 0427 12/13/13 0428 12/13/13 1326 12/14/13 0410  NA  --  133* 135* 134* 132*  --  131*  K  --  3.6* 4.7 4.7 4.7  --  3.8  CL  --  98 101 99 97  --  94*  CO2  --  26 25 27 27   --  25  GLUCOSE  --  149* 101* 105* 104*  --  139*  BUN  --  9 10 10 10   --  13  CREATININE  --  0.96 0.90 0.95 0.97  --  1.16  CALCIUM  --  7.5* 7.6* 7.9* 7.7*  --  7.4*  MG 1.8  --   --   --   --  1.8  --    Liver Function Tests:  Recent Labs Lab 12/09/13 0230 12/11/13 0427 12/14/13 0410  AST 29 40* 29  ALT 17 17 15   ALKPHOS 163* 164* 144*  BILITOT 1.8* 1.9* 1.6*  PROT 5.2* 5.5* 5.0*  ALBUMIN 1.6* 1.8* 1.6*   No results for input(s): LIPASE, AMYLASE in the last 168 hours.  Recent Labs Lab 12/13/13 0428  AMMONIA 50   CBC:  Recent  Labs Lab 12/08/13 0609 12/09/13 0230 12/10/13 0235 12/13/13 0428 12/14/13 0410  WBC 6.2 6.7 6.7 6.8 6.7  NEUTROABS  --   --   --   --  4.0  HGB 7.7* 8.5* 8.9* 8.6* 8.1*  HCT 23.0* 25.5* 27.0* 26.5* 25.3*  MCV 93.1 92.4 93.8 94.0 94.8  PLT 71* 70* 64* 77* 67*   Cardiac Enzymes: No results for input(s): CKTOTAL, CKMB, CKMBINDEX, TROPONINI in the last 168 hours. BNP: BNP (last 3 results)  Recent Labs  11/15/13 1237  PROBNP 864.3*   CBG: No results for input(s): GLUCAP in the last 168 hours.     SignedNita Sells  [facility] 12/14/2013, 9:11 AM

## 2013-12-14 NOTE — Progress Notes (Signed)
Physical Therapy Treatment Patient Details Name: Clifford Cook MRN: [id number] DOB: Jul 06, 1945 Today's Date: 12/14/2013    History of Present Illness pt presents post fall with L hip fx post cannulated pinning.  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    PT Comments    Pt progressing well with mobility, but O2 sats drop into the low 80's with gait on RA,  EHR around 100's  Follow Up Recommendations  Home health PT;Supervision/Assistance - 24 hour     Equipment Recommendations  None recommended by PT    Recommendations for Other Services       Precautions / Restrictions Precautions Precautions: Fall Restrictions Weight Bearing Restrictions: Yes LLE Weight Bearing: Weight bearing as tolerated    Mobility  Bed Mobility Overal bed mobility: Needs Assistance Bed Mobility: Supine to Sit     Supine to sit: Modified independent (Device/Increase time) Sit to supine: Modified independent (Device/Increase time)   General bed mobility comments: good technique  Transfers Overall transfer level: Needs assistance Equipment used: Rolling walker (2 wheeled) Transfers: Sit to/from Stand Sit to Stand: Supervision (min from low toilet)         General transfer comment: cues for hand placement  Ambulation/Gait Ambulation/Gait assistance: Supervision Ambulation Distance (Feet): 180 Feet Assistive device: Rolling walker (2 wheeled) Gait Pattern/deviations: Step-through pattern Gait velocity: moderate speed   General Gait Details: gait still mildly antalgic at first, then more fluid   Stairs            Wheelchair Mobility    Modified Rankin (Stroke Patients Only)       Balance Overall balance assessment: No apparent balance deficits (not formally assessed)                                  Cognition Arousal/Alertness: Awake/alert Behavior During Therapy: WFL for tasks assessed/performed Overall Cognitive Status:  Within Functional Limits for tasks assessed                      Exercises      General Comments        Pertinent Vitals/Pain Pain Assessment: 0-10 Pain Score:  (not bad.) Pain Location: l hip Pain Descriptors / Indicators: Aching Pain Intervention(s): Monitored during session    Home Living                      Prior Function            PT Goals (current goals can now be found in the care plan section) Acute Rehab PT Goals Time For Goal Achievement: 12/14/13 Potential to Achieve Goals: Good Progress towards PT goals: Progressing toward goals    Frequency  Min 5X/week    PT Plan Current plan remains appropriate    Co-evaluation             End of Session Equipment Utilized During Treatment: Oxygen (for use after gait) Activity Tolerance: Patient tolerated treatment well Patient left: in chair;with call bell/phone within reach;with family/visitor present     Time: 1130-1147 PT Time Calculation (min) (ACUTE ONLY): 17 min  Charges:  $Gait Training: 8-22 mins                    G Codes:      Caileigh Canche, Tessie Fass 12/14/2013, 12:02 PM 12/14/2013  Donnella Sham, PT [phone]  (pager)

## 2013-12-14 NOTE — Progress Notes (Signed)
[facility] was informed that pt had a bp of 75/40, 80/50 manually and pt was in need of pain management that would not effect his bp. New orders were received and pt will be monitored. Arthor Captain LPN

## 2013-12-14 NOTE — Progress Notes (Signed)
SATURATION QUALIFICATIONS: (This note is used to comply with regulatory documentation for home oxygen)  Patient Saturations on Room Air at Rest = 87%  Patient Saturations on Room Air while Ambulating = %  Patient Saturations on 2 Liters of oxygen while Ambulating = 92%  Please briefly explain why patient needs home oxygen: CHF

## 2013-12-14 NOTE — Plan of Care (Signed)
Problem: Phase II Progression Outcomes Goal: CMS/Neurovascular status WDL Outcome: Completed/Met Date Met:  12/14/13 Goal: Discharge plan established Outcome: Completed/Met Date Met:  12/14/13 Goal: Other Phase II Outcomes/Goals Outcome: Not Applicable Date Met:  47/12/52  Problem: Phase III Progression Outcomes Goal: Activity at appropriate level-compared to baseline (UP IN CHAIR FOR HEMODIALYSIS)  Outcome: Completed/Met Date Met:  12/14/13 Goal: Incision clean - minimal/no drainage Outcome: Completed/Met Date Met:  12/14/13 Goal: Ambulate BID with assist as able Outcome: Completed/Met Date Met:  12/14/13 Goal: Discharge plan remains appropriate-arrangements made Outcome: Completed/Met Date Met:  12/14/13 Goal: Other Phase III Outcomes/Goals Outcome: Not Applicable Date Met:  71/29/29

## 2013-12-14 NOTE — Progress Notes (Signed)
Patient discharge teaching given, including activity, diet, follow-up appoints, and medications. Patient verbalized understanding of all discharge instructions. IV access was d/c'd. Vitals are stable. Skin is intact except as charted in most recent assessments. Pt to be escorted out by NT, to be driven home by family. 

## 2013-12-15 LAB — CULTURE, BLOOD (ROUTINE X 2)
Culture: NO GROWTH
Culture: NO GROWTH
Culture: NO GROWTH

## 2014-01-31 ENCOUNTER — Emergency Department (HOSPITAL_COMMUNITY)
Admission: EM | Admit: 2014-01-31 | Discharge: 2014-02-01 | Disposition: A | Discharge status: Home / Self Care | Payer: Medicare Other | Attending: Emergency Medicine | Admitting: Emergency Medicine

## 2014-01-31 ENCOUNTER — Encounter (HOSPITAL_COMMUNITY): Payer: Self-pay | Admitting: Emergency Medicine

## 2014-01-31 DIAGNOSIS — K219 Gastro-esophageal reflux disease without esophagitis: Secondary | ICD-10-CM | POA: Insufficient documentation

## 2014-01-31 DIAGNOSIS — Z79899 Other long term (current) drug therapy: Secondary | ICD-10-CM | POA: Insufficient documentation

## 2014-01-31 DIAGNOSIS — Z720 Tobacco use: Secondary | ICD-10-CM | POA: Insufficient documentation

## 2014-01-31 DIAGNOSIS — R5383 Other fatigue: Secondary | ICD-10-CM | POA: Diagnosis present

## 2014-01-31 DIAGNOSIS — Z792 Long term (current) use of antibiotics: Secondary | ICD-10-CM | POA: Insufficient documentation

## 2014-01-31 DIAGNOSIS — K7682 Hepatic encephalopathy: Secondary | ICD-10-CM

## 2014-01-31 DIAGNOSIS — R471 Dysarthria and anarthria: Secondary | ICD-10-CM | POA: Insufficient documentation

## 2014-01-31 DIAGNOSIS — E860 Dehydration: Secondary | ICD-10-CM | POA: Insufficient documentation

## 2014-01-31 DIAGNOSIS — Z880 Allergy status to penicillin: Secondary | ICD-10-CM | POA: Insufficient documentation

## 2014-01-31 DIAGNOSIS — K7290 Hepatic failure, unspecified without coma: Secondary | ICD-10-CM | POA: Insufficient documentation

## 2014-01-31 LAB — CBC
HCT: 27.7 % — ABNORMAL LOW (ref 39.0–52.0)
Hemoglobin: 9.3 g/dL — ABNORMAL LOW (ref 13.0–17.0)
MCH: 30.1 pg (ref 26.0–34.0)
MCHC: 33.6 g/dL (ref 30.0–36.0)
MCV: 89.6 fL (ref 78.0–100.0)
Platelets: 85 10*3/uL — ABNORMAL LOW (ref 150–400)
RBC: 3.09 MIL/uL — ABNORMAL LOW (ref 4.22–5.81)
RDW: 16.6 % — AB (ref 11.5–15.5)
WBC: 6.4 10*3/uL (ref 4.0–10.5)

## 2014-01-31 LAB — COMPREHENSIVE METABOLIC PANEL
ALT: 38 U/L (ref 0–53)
AST: 59 U/L — ABNORMAL HIGH (ref 0–37)
Albumin: 2.4 g/dL — ABNORMAL LOW (ref 3.5–5.2)
Alkaline Phosphatase: 134 U/L — ABNORMAL HIGH (ref 39–117)
Anion gap: 10 (ref 5–15)
BILIRUBIN TOTAL: 3 mg/dL — AB (ref 0.3–1.2)
BUN: 14 mg/dL (ref 6–23)
CALCIUM: 8.5 mg/dL (ref 8.4–10.5)
CO2: 25 mmol/L (ref 19–32)
Chloride: 99 mEq/L (ref 96–112)
Creatinine, Ser: 1.46 mg/dL — ABNORMAL HIGH (ref 0.50–1.35)
GFR calc Af Amer: 55 mL/min — ABNORMAL LOW (ref >90)
GFR, EST NON AFRICAN AMERICAN: 47 mL/min — AB (ref >90)
Glucose, Bld: 130 mg/dL — ABNORMAL HIGH (ref 70–99)
Potassium: 4.4 mmol/L (ref 3.5–5.1)
Sodium: 134 mmol/L — ABNORMAL LOW (ref 135–145)
TOTAL PROTEIN: 6.5 g/dL (ref 6.0–8.3)

## 2014-01-31 LAB — MAGNESIUM: Magnesium: 2 mg/dL (ref 1.5–2.5)

## 2014-01-31 LAB — AMMONIA: AMMONIA: 155 umol/L — AB (ref 11–32)

## 2014-01-31 MED ORDER — SODIUM CHLORIDE 0.9 % IV BOLUS (SEPSIS)
500.0000 mL | Freq: Once | INTRAVENOUS | Status: AC
  Administered 2014-01-31: 500 mL via INTRAVENOUS

## 2014-01-31 NOTE — ED Provider Notes (Signed)
CSN: [id number]     Arrival date & time 01/31/14  2106 History   First MD Initiated Contact with Patient 01/31/14 2236     Chief Complaint  Patient presents with  . Drug Overdose     (Consider location/radiation/quality/duration/timing/severity/associated sxs/prior Treatment) Patient is a 69 y.o. male presenting with Overdose. The history is provided by the patient and a relative.  Drug Overdose   WINSLOW EDERER is a 69 y.o. male who presents for evaluation of lethargy, and suspected dehydration.  Family members are worried that he took an extra dose of Lasix, and spironolactone, 3 days ago, and this may have made him more sleepy than usual.  He continues to be able to walk, eat, and drink.  He has been using his lactulose, sporadically.  He is post beating it twice a day for hepatic encephalopathy.  He is not having constipation, nausea, vomiting, fever, chills, cough or chest pain.  He is taking his other medications as prescribed.  He is recovering from hip fracture, one month ago and is currently using oxycodone, one,  at night only.  There are no other known modifying factors.    Past Medical History  Diagnosis Date  . Stomach ulcer   . GI bleed   . Liver disorder   . Cirrhosis   . GERD (gastroesophageal reflux disease)    Past Surgical History  Procedure Laterality Date  . Cataract extraction, bilateral    . Surgery for lazy eye      age 44  . Esophagogastroduodenoscopy N/A 04/16/2012    Procedure: ESOPHAGOGASTRODUODENOSCOPY (EGD);  Surgeon: Jeryl Columbia, MD;  Location: [facility];  Service: Endoscopy;  Laterality: N/A;  . Esophagogastroduodenoscopy N/A 04/17/2012    Procedure: ESOPHAGOGASTRODUODENOSCOPY (EGD);  Surgeon: Jeryl Columbia, MD;  Location: [facility];  Service: Endoscopy;  Laterality: N/A;  . Direct laryngoscopy N/A 07/13/2013    Procedure: DIRECT LARYNGOSCOPY WITH EXCISION TUMOR;  Surgeon: Rozetta Nunnery, MD;  Location: [facility];  Service: ENT;   Laterality: N/A;  . Hip pinning,cannulated Left 12/06/2013    Procedure: CANNULATED HIP PINNING;  Surgeon: Renette Butters, MD;  Location: [facility];  Service: Orthopedics;  Laterality: Left;  . Tee without cardioversion N/A 12/13/2013    Procedure: TRANSESOPHAGEAL ECHOCARDIOGRAM (TEE);  Surgeon: Larey Dresser, MD;  Location: [facility];  Service: Cardiovascular;  Laterality: N/A;   History reviewed. No pertinent family history. History  Substance Use Topics  . Smoking status: Current Every Day Smoker -- 1.00 packs/day    Types: Cigarettes  . Smokeless tobacco: Never Used  . Alcohol Use: No     Comment: over i yr    Review of Systems  All other systems reviewed and are negative.     Allergies  Pulmicort and Penicillins  Home Medications   Prior to Admission medications   Medication Sig Start Date End Date Taking? Authorizing Provider  diphenhydrAMINE (BENADRYL) 25 mg capsule Take 1 capsule (25 mg total) by mouth every 12 (twelve) hours as needed for itching. 11/09/13  Yes Barton Dubois, MD  ferrous sulfate 325 (65 FE) MG tablet Take 1 tablet (325 mg total) by mouth 2 (two) times daily with a meal. 11/09/13  Yes Barton Dubois, MD  furosemide (LASIX) 40 MG tablet Take 40-80 mg by mouth. Take 2 Tablets (80 mg) By Mouth Every AM & Take 1 Tablet (40 mg) Every PM.   Yes Historical Provider, MD  lactulose (CHRONULAC) 10 GM/15ML solution Take 20 g by  mouth 3 (three) times daily.    Yes Historical Provider, MD  Magnesium 250 MG TABS Take 250 mg by mouth daily.   Yes Historical Provider, MD  Multiple Vitamins-Minerals (MULTIVITAMIN & MINERAL PO) Take 1 tablet by mouth daily.   Yes Historical Provider, MD  oxyCODONE (OXY IR/ROXICODONE) 5 MG immediate release tablet Take 1 tablet (5 mg total) by mouth every 6 (six) hours as needed for moderate pain. 12/14/13  Yes Nita Sells, MD  pantoprazole (PROTONIX) 40 MG tablet Take 1 tablet (40 mg total) by mouth daily. Patient taking  differently: Take 40 mg by mouth 2 (two) times daily.  11/09/13  Yes Barton Dubois, MD  Polysaccharide Iron Complex (POLY-IRON 150 PO) Take 1 capsule by mouth daily.   Yes Historical Provider, MD  potassium chloride 20 MEQ/15ML (10%) solution Take 40 mEq by mouth daily.   Yes Historical Provider, MD  spironolactone (ALDACTONE) 100 MG tablet Take 100 mg by mouth daily.   Yes Historical Provider, MD  alendronate (FOSAMAX) 70 MG tablet Take 1 tablet (70 mg total) by mouth once a week. Hold until antibiotic therapy is completed 11/09/13   Barton Dubois, MD  cefTRIAXone (ROCEPHIN) 40 MG/ML IVPB Inject 50 mLs (2 g total) into the vein daily. Patient not taking: Reported on 01/31/2014 12/14/13   Nita Sells, MD  docusate sodium (COLACE) 100 MG capsule Take 1 capsule (100 mg total) by mouth 2 (two) times daily. Patient not taking: Reported on 01/31/2014 12/06/13   Renette Butters, MD  feeding supplement, RESOURCE BREEZE, (RESOURCE BREEZE) LIQD Take 1 Container by mouth 3 (three) times daily between meals. Patient not taking: Reported on 12/05/2013 11/09/13   Barton Dubois, MD  furosemide (LASIX) 20 MG tablet Take 1 tablet (20 mg total) by mouth daily. Patient not taking: Reported on 01/31/2014 12/15/13   Nita Sells, MD  HYDROcodone-acetaminophen (NORCO) 5-325 MG per tablet Take 1-2 tablets by mouth every 4 (four) hours as needed for moderate pain. 12/06/13   Renette Butters, MD  metoprolol succinate (TOPROL-XL) 12.5 mg TB24 24 hr tablet Take 0.5 tablets (12.5 mg total) by mouth daily. Patient not taking: Reported on 01/31/2014 12/15/13   Nita Sells, MD  rifaximin (XIFAXAN) 550 MG TABS tablet Take 1 tablet (550 mg total) by mouth 2 (two) times daily. Patient not taking: Reported on 01/31/2014 12/14/13   Nita Sells, MD  sorbitol 70 % SOLN Take 20 mLs by mouth daily as needed for moderate constipation (goal is 1-2 good BM's daily). Patient not taking: Reported on 01/31/2014  11/09/13   Barton Dubois, MD  temazepam (RESTORIL) 7.5 MG capsule Take 1 capsule (7.5 mg total) by mouth at bedtime as needed for sleep. 11/09/13   Mahima Pandey, MD   BP 104/54 mmHg  Pulse 93  Temp(Src) 97.5 F (36.4 C) (Oral)  Resp 16  SpO2 96% Physical Exam  Constitutional: He appears well-developed.  Frail, elderly  HENT:  Head: Normocephalic and atraumatic.  Right Ear: External ear normal.  Left Ear: External ear normal.  Mucus membranes dry.  Eyes: Conjunctivae and EOM are normal. Pupils are equal, round, and reactive to light.  Neck: Normal range of motion and phonation normal. Neck supple.  Cardiovascular: Normal rate, regular rhythm and normal heart sounds.   Pulmonary/Chest: Effort normal and breath sounds normal. He exhibits no bony tenderness.  Abdominal: Soft. There is no tenderness.  Musculoskeletal: Normal range of motion. He exhibits edema (3+ bilateral legs).  Neurological: He is alert. No cranial nerve  deficit or sensory deficit. He exhibits normal muscle tone. Coordination normal.  Mild dysarthria  Skin: Skin is warm, dry and intact.  Psychiatric: He has a normal mood and affect. His behavior is normal.  Nursing note and vitals reviewed.   ED Course  Procedures (including critical care time)  Medications  sodium chloride 0.9 % bolus 500 mL (500 mLs Intravenous New Bag/Given 01/31/14 2314)    Patient Vitals for the past 24 hrs:  BP Temp Temp src Pulse Resp SpO2  01/31/14 2318 (!) 104/54 mmHg 97.5 F (36.4 C) Oral 93 16 96 %  01/31/14 2120 (!) 103/51 mmHg 97.6 F (36.4 C) Oral 102 16 100 %  01/31/14 2113 (!) 116/52 mmHg 97.8 F (36.6 C) Oral 100 16 100 %   I had along discussion with the patient's daughter.  She prefers to manage him at home, if possible.  We discussed his laboratory abnormalities in the indication of moderate dehydration, with about 25% loss of kidney function.  The patient has been maintained on the fluid restriction of 1500 cc, each  day.  He continues to be hungry and thirsty.  He was offered oral nutrition in the ED, and tolerated it.  The daughter and the patient's wife are comfortable managing him at home.  They understand that he needs to take his lactulose 3 times a day and that he will have additional fluid loss, and diarrhea, decause of it.  They plan on contacting his PCP in the morning to arrange for ongoing care and management.  11:09 PM Reevaluation with update and discussion. After initial assessment and treatment, an updated evaluation reveals no additional complaints, clinical status is unchanged. Eilam Shrewsbury L    Labs Review Labs Reviewed  CBC - Abnormal; Notable for the following:    RBC 3.09 (*)    Hemoglobin 9.3 (*)    HCT 27.7 (*)    RDW 16.6 (*)    Platelets 85 (*)    All other components within normal limits  COMPREHENSIVE METABOLIC PANEL - Abnormal; Notable for the following:    Sodium 134 (*)    Glucose, Bld 130 (*)    Creatinine, Ser 1.46 (*)    Albumin 2.4 (*)    AST 59 (*)    Alkaline Phosphatase 134 (*)    Total Bilirubin 3.0 (*)    GFR calc non Af Amer 47 (*)    GFR calc Af Amer 55 (*)    All other components within normal limits  AMMONIA - Abnormal; Notable for the following:    Ammonia 155 (*)    All other components within normal limits  MAGNESIUM    Imaging Review No results found.   EKG Interpretation   Date/Time:  Monday January 31 2014 21:51:25 EST Ventricular Rate:  97 PR Interval:  159 QRS Duration: 103 QT Interval:  402 QTC Calculation: 511 R Axis:   -59 Text Interpretation:  Sinus rhythm Inferior infarct, old Prolonged QT  interval Since last tracing QT has lengthened Confirmed by Eulis Foster  MD,  Isabela Nardelli ([id number]) on 01/31/2014 10:38:25 PM      MDM   Final diagnoses:  Hepatic encephalopathy  Dehydration    Dehydration, related to fluid restriction, and ongoing diuresis.  I doubt that the extra doses of Lasix and spironolactone have significantly  dehydrated him at this time. Potassium is normal.   He does have increased ammonia level related to noncompliance with lactulose treatment.  Since he is tolerating oral liquids, he can be managed  at home with regular dosing of lactulose, and close monitoring with follow-up appointment with his PCP, and repeat lab testing in 3 or 4 days.  Nursing Notes Reviewed/ Care Coordinated Applicable Imaging Reviewed Interpretation of Laboratory Data incorporated into ED treatment  The patient appears reasonably screened and/or stabilized for discharge and I doubt any other medical condition or other [facility] requiring further screening, evaluation, or treatment in the ED at this time prior to discharge.  Plan: Home Medications- usual; Home Treatments- rest; return here if the recommended treatment, does not improve the symptoms; Recommended follow up- PCP 3-4 days, and prn.     Richarda Blade, MD 02/01/14 [phone]

## 2014-01-31 NOTE — ED Notes (Addendum)
Pt's daughter and wife report patient took extra dose of all medications (Lasix, Spironolactone, Protonix, Magnesium, Iron, etc) sometime today or yesterday. Denies any AMS or confusion but has felt dizziness. Saturday he weighed 196lbs (and yesterday), but weighed 187lbs today. Hx non alcohol related liver cirrhosis. Family is also concerned his ammonia level is elevated causing him to take extra dose of medication. No other issues/concerns.

## 2014-01-31 NOTE — ED Notes (Signed)
Patients spouse reports tonight she noticed that his Tuesday morning dose of medications are not in the container and thinks patient may have took his Tuesday morning dose of medications with his Monday morning dose of medications. Pt states he has felt more dizzy and had nausea this afternoon. Pt's spouse and daughter reports he has been more drowsy than usual and lying in the bed. Pt is resting quietly with eyes closed. Pt will open eyes and answer questions when spoken to.

## 2014-02-01 NOTE — Discharge Instructions (Signed)
Increase fluid restriction to about 3,000 cc per day, for 3 days. Encourage 3 meals a day. Take lactulose 3 times a day, as prescribed. Return here, if needed, for problems.   Dehydration, Adult Dehydration is when you lose more fluids from the body than you take in. Vital organs like the kidneys, brain, and heart cannot function without a proper amount of fluids and salt. Any loss of fluids from the body can cause dehydration.  CAUSES   Vomiting.  Diarrhea.  Excessive sweating.  Excessive urine output.  Fever. SYMPTOMS  Mild dehydration  Thirst.  Dry lips.  Slightly dry mouth. Moderate dehydration  Very dry mouth.  Sunken eyes.  Skin does not bounce back quickly when lightly pinched and released.  Dark urine and decreased urine production.  Decreased tear production.  Headache. Severe dehydration  Very dry mouth.  Extreme thirst.  Rapid, weak pulse (more than 100 beats per minute at rest).  Cold hands and feet.  Not able to sweat in spite of heat and temperature.  Rapid breathing.  Blue lips.  Confusion and lethargy.  Difficulty being awakened.  Minimal urine production.  No tears. DIAGNOSIS  Your caregiver will diagnose dehydration based on your symptoms and your exam. Blood and urine tests will help confirm the diagnosis. The diagnostic evaluation should also identify the cause of dehydration. TREATMENT  Treatment of mild or moderate dehydration can often be done at home by increasing the amount of fluids that you drink. It is best to drink small amounts of fluid more often. Drinking too much at one time can make vomiting worse. Refer to the home care instructions below. Severe dehydration needs to be treated at the hospital where you will probably be given intravenous (IV) fluids that contain water and electrolytes. HOME CARE INSTRUCTIONS   Ask your caregiver about specific rehydration instructions.  Drink enough fluids to keep your urine  clear or pale yellow.  Drink small amounts frequently if you have nausea and vomiting.  Eat as you normally do.  Avoid:  Foods or drinks high in sugar.  Carbonated drinks.  Juice.  Extremely hot or cold fluids.  Drinks with caffeine.  Fatty, greasy foods.  Alcohol.  Tobacco.  Overeating.  Gelatin desserts.  Wash your hands well to avoid spreading bacteria and viruses.  Only take over-the-counter or prescription medicines for pain, discomfort, or fever as directed by your caregiver.  Ask your caregiver if you should continue all prescribed and over-the-counter medicines.  Keep all follow-up appointments with your caregiver. SEEK MEDICAL CARE IF:  You have abdominal pain and it increases or stays in one area (localizes).  You have a rash, stiff neck, or severe headache.  You are irritable, sleepy, or difficult to awaken.  You are weak, dizzy, or extremely thirsty. SEEK IMMEDIATE MEDICAL CARE IF:   You are unable to keep fluids down or you get worse despite treatment.  You have frequent episodes of vomiting or diarrhea.  You have blood or green matter (bile) in your vomit.  You have blood in your stool or your stool looks black and tarry.  You have not urinated in 6 to 8 hours, or you have only urinated a small amount of very dark urine.  You have a fever.  You faint. MAKE SURE YOU:   Understand these instructions.  Will watch your condition.  Will get help right away if you are not doing well or get worse. Document Released: 12/31/2004 Document Revised: 03/25/2011 Document Reviewed: 08/20/2010 ExitCare  Patient Information 2015 [place]. This information is not intended to replace advice given to you by your health care provider. Make sure you discuss any questions you have with your health care provider.

## 2014-03-16 ENCOUNTER — Encounter (HOSPITAL_COMMUNITY): Payer: Self-pay

## 2014-03-16 ENCOUNTER — Inpatient Hospital Stay (HOSPITAL_COMMUNITY)
Admission: EM | Admit: 2014-03-16 | Discharge: 2014-03-21 | DRG: 683 | Disposition: A | Discharge status: Home / Self Care | Payer: Medicare Other | Attending: Internal Medicine | Admitting: Internal Medicine

## 2014-03-16 DIAGNOSIS — L03114 Cellulitis of left upper limb: Secondary | ICD-10-CM | POA: Diagnosis present

## 2014-03-16 DIAGNOSIS — D696 Thrombocytopenia, unspecified: Secondary | ICD-10-CM | POA: Diagnosis present

## 2014-03-16 DIAGNOSIS — R109 Unspecified abdominal pain: Secondary | ICD-10-CM | POA: Diagnosis present

## 2014-03-16 DIAGNOSIS — K7581 Nonalcoholic steatohepatitis (NASH): Secondary | ICD-10-CM | POA: Diagnosis present

## 2014-03-16 DIAGNOSIS — E871 Hypo-osmolality and hyponatremia: Secondary | ICD-10-CM | POA: Diagnosis present

## 2014-03-16 DIAGNOSIS — F17210 Nicotine dependence, cigarettes, uncomplicated: Secondary | ICD-10-CM | POA: Diagnosis present

## 2014-03-16 DIAGNOSIS — K219 Gastro-esophageal reflux disease without esophagitis: Secondary | ICD-10-CM | POA: Diagnosis present

## 2014-03-16 DIAGNOSIS — K7460 Unspecified cirrhosis of liver: Secondary | ICD-10-CM | POA: Diagnosis present

## 2014-03-16 DIAGNOSIS — R188 Other ascites: Secondary | ICD-10-CM | POA: Diagnosis present

## 2014-03-16 DIAGNOSIS — N179 Acute kidney failure, unspecified: Secondary | ICD-10-CM | POA: Diagnosis present

## 2014-03-16 DIAGNOSIS — E875 Hyperkalemia: Secondary | ICD-10-CM | POA: Diagnosis present

## 2014-03-16 DIAGNOSIS — K029 Dental caries, unspecified: Secondary | ICD-10-CM | POA: Diagnosis present

## 2014-03-16 DIAGNOSIS — D638 Anemia in other chronic diseases classified elsewhere: Secondary | ICD-10-CM | POA: Diagnosis present

## 2014-03-16 DIAGNOSIS — Z8711 Personal history of peptic ulcer disease: Secondary | ICD-10-CM

## 2014-03-16 DIAGNOSIS — R601 Generalized edema: Secondary | ICD-10-CM | POA: Insufficient documentation

## 2014-03-16 DIAGNOSIS — K045 Chronic apical periodontitis: Secondary | ICD-10-CM | POA: Diagnosis present

## 2014-03-16 DIAGNOSIS — Z79899 Other long term (current) drug therapy: Secondary | ICD-10-CM | POA: Diagnosis not present

## 2014-03-16 DIAGNOSIS — L02519 Cutaneous abscess of unspecified hand: Secondary | ICD-10-CM | POA: Insufficient documentation

## 2014-03-16 DIAGNOSIS — R1030 Lower abdominal pain, unspecified: Secondary | ICD-10-CM

## 2014-03-16 LAB — URINALYSIS, ROUTINE W REFLEX MICROSCOPIC
BILIRUBIN URINE: NEGATIVE
Glucose, UA: NEGATIVE mg/dL
Hgb urine dipstick: NEGATIVE
KETONES UR: NEGATIVE mg/dL
NITRITE: NEGATIVE
PROTEIN: NEGATIVE mg/dL
Specific Gravity, Urine: 1.017 (ref 1.005–1.030)
UROBILINOGEN UA: 1 mg/dL (ref 0.0–1.0)
pH: 6.5 (ref 5.0–8.0)

## 2014-03-16 LAB — COMPREHENSIVE METABOLIC PANEL
ALT: 31 U/L (ref 0–53)
AST: 47 U/L — ABNORMAL HIGH (ref 0–37)
Albumin: 2.1 g/dL — ABNORMAL LOW (ref 3.5–5.2)
Alkaline Phosphatase: 126 U/L — ABNORMAL HIGH (ref 39–117)
Anion gap: 10 (ref 5–15)
BUN: 39 mg/dL — ABNORMAL HIGH (ref 6–23)
CO2: 20 mmol/L (ref 19–32)
CREATININE: 2.81 mg/dL — AB (ref 0.50–1.35)
Calcium: 7.6 mg/dL — ABNORMAL LOW (ref 8.4–10.5)
Chloride: 103 mmol/L (ref 96–112)
GFR calc non Af Amer: 21 mL/min — ABNORMAL LOW (ref >90)
GFR, EST AFRICAN AMERICAN: 25 mL/min — AB (ref >90)
GLUCOSE: 92 mg/dL (ref 70–99)
Potassium: 5.2 mmol/L — ABNORMAL HIGH (ref 3.5–5.1)
SODIUM: 133 mmol/L — AB (ref 135–145)
TOTAL PROTEIN: 5.3 g/dL — AB (ref 6.0–8.3)
Total Bilirubin: 2.8 mg/dL — ABNORMAL HIGH (ref 0.3–1.2)

## 2014-03-16 LAB — CBC WITH DIFFERENTIAL/PLATELET
BASOS ABS: 0.1 10*3/uL (ref 0.0–0.1)
Basophils Relative: 1 % (ref 0–1)
EOS PCT: 9 % — AB (ref 0–5)
Eosinophils Absolute: 0.5 10*3/uL (ref 0.0–0.7)
HCT: 23.2 % — ABNORMAL LOW (ref 39.0–52.0)
Hemoglobin: 8 g/dL — ABNORMAL LOW (ref 13.0–17.0)
LYMPHS ABS: 0.9 10*3/uL (ref 0.7–4.0)
Lymphocytes Relative: 16 % (ref 12–46)
MCH: 31.5 pg (ref 26.0–34.0)
MCHC: 34.5 g/dL (ref 30.0–36.0)
MCV: 91.3 fL (ref 78.0–100.0)
MONOS PCT: 11 % (ref 3–12)
Monocytes Absolute: 0.6 10*3/uL (ref 0.1–1.0)
NEUTROS PCT: 64 % (ref 43–77)
Neutro Abs: 3.6 10*3/uL (ref 1.7–7.7)
PLATELETS: 76 10*3/uL — AB (ref 150–400)
RBC: 2.54 MIL/uL — AB (ref 4.22–5.81)
RDW: 19.1 % — ABNORMAL HIGH (ref 11.5–15.5)
WBC: 5.7 10*3/uL (ref 4.0–10.5)

## 2014-03-16 LAB — I-STAT CG4 LACTIC ACID, ED
LACTIC ACID, VENOUS: 2.39 mmol/L — AB (ref 0.5–2.0)
LACTIC ACID, VENOUS: 1.45 mmol/L (ref 0.5–2.0)

## 2014-03-16 LAB — URINE MICROSCOPIC-ADD ON

## 2014-03-16 LAB — LIPASE, BLOOD: Lipase: 35 U/L (ref 11–59)

## 2014-03-16 LAB — POC OCCULT BLOOD, ED: Fecal Occult Bld: POSITIVE — AB

## 2014-03-16 LAB — AMMONIA: Ammonia: 135 umol/L — ABNORMAL HIGH (ref 11–32)

## 2014-03-16 MED ORDER — MAGNESIUM GLUCONATE 500 MG PO TABS
250.0000 mg | ORAL_TABLET | Freq: Every day | ORAL | Status: DC
  Administered 2014-03-16 – 2014-03-21 (×6): 250 mg via ORAL
  Filled 2014-03-16 (×6): qty 1

## 2014-03-16 MED ORDER — SODIUM CHLORIDE 0.9 % IV SOLN: 250.0000 mL | INTRAVENOUS | Status: DC | PRN

## 2014-03-16 MED ORDER — ALENDRONATE SODIUM 70 MG PO TABS: 70.0000 mg | ORAL_TABLET | ORAL | Status: DC

## 2014-03-16 MED ORDER — OXYCODONE HCL 5 MG PO TABS
5.0000 mg | ORAL_TABLET | Freq: Four times a day (QID) | ORAL | Status: DC | PRN
  Administered 2014-03-16 – 2014-03-20 (×6): 5 mg via ORAL
  Filled 2014-03-16 (×6): qty 1

## 2014-03-16 MED ORDER — METOPROLOL SUCCINATE ER 25 MG PO TB24: 12.5000 mg | ORAL_TABLET | Freq: Every day | ORAL | Status: DC

## 2014-03-16 MED ORDER — SODIUM CHLORIDE 0.9 % IJ SOLN: 3.0000 mL | INTRAMUSCULAR | Status: DC | PRN

## 2014-03-16 MED ORDER — RIFAXIMIN 550 MG PO TABS
550.0000 mg | ORAL_TABLET | Freq: Two times a day (BID) | ORAL | Status: DC
  Administered 2014-03-16 – 2014-03-21 (×10): 550 mg via ORAL
  Filled 2014-03-16 (×11): qty 1

## 2014-03-16 MED ORDER — ONDANSETRON HCL 4 MG/2ML IJ SOLN: 4.0000 mg | Freq: Three times a day (TID) | INTRAMUSCULAR | Status: DC | PRN

## 2014-03-16 MED ORDER — ONDANSETRON HCL 4 MG PO TABS: 4.0000 mg | ORAL_TABLET | Freq: Four times a day (QID) | ORAL | Status: DC | PRN

## 2014-03-16 MED ORDER — BOOST / RESOURCE BREEZE PO LIQD
1.0000 | Freq: Three times a day (TID) | ORAL | Status: DC
  Administered 2014-03-16 – 2014-03-20 (×7): 1 via ORAL

## 2014-03-16 MED ORDER — PANTOPRAZOLE SODIUM 40 MG PO TBEC
40.0000 mg | DELAYED_RELEASE_TABLET | Freq: Every day | ORAL | Status: DC
  Administered 2014-03-16 – 2014-03-21 (×6): 40 mg via ORAL
  Filled 2014-03-16 (×6): qty 1

## 2014-03-16 MED ORDER — ACETAMINOPHEN 650 MG RE SUPP: 650.0000 mg | Freq: Four times a day (QID) | RECTAL | Status: DC | PRN

## 2014-03-16 MED ORDER — POLYETHYLENE GLYCOL 3350 17 G PO PACK: 17.0000 g | PACK | Freq: Every day | ORAL | Status: DC | PRN

## 2014-03-16 MED ORDER — ACETAMINOPHEN 325 MG PO TABS: 650.0000 mg | ORAL_TABLET | Freq: Four times a day (QID) | ORAL | Status: DC | PRN

## 2014-03-16 MED ORDER — MAGNESIUM 200 MG PO TABS
200.0000 mg | ORAL_TABLET | Freq: Every day | ORAL | Status: DC
  Filled 2014-03-16: qty 1

## 2014-03-16 MED ORDER — ONDANSETRON HCL 4 MG/2ML IJ SOLN: 4.0000 mg | Freq: Four times a day (QID) | INTRAMUSCULAR | Status: DC | PRN

## 2014-03-16 MED ORDER — SODIUM CHLORIDE 0.9 % IJ SOLN
3.0000 mL | Freq: Two times a day (BID) | INTRAMUSCULAR | Status: DC
  Administered 2014-03-16 – 2014-03-18 (×5): 3 mL via INTRAVENOUS

## 2014-03-16 MED ORDER — POLYSACCHARIDE IRON COMPLEX 150 MG PO CAPS
150.0000 mg | ORAL_CAPSULE | Freq: Every day | ORAL | Status: DC
  Administered 2014-03-16 – 2014-03-21 (×6): 150 mg via ORAL
  Filled 2014-03-16 (×6): qty 1

## 2014-03-16 MED ORDER — SODIUM CHLORIDE 0.9 % IJ SOLN: 3.0000 mL | Freq: Two times a day (BID) | INTRAMUSCULAR | Status: DC

## 2014-03-16 MED ORDER — SODIUM CHLORIDE 0.9 % IV SOLN
250.0000 mL | INTRAVENOUS | Status: DC | PRN
Start: 2014-03-16 — End: 2014-03-16

## 2014-03-16 MED ORDER — LIDOCAINE-EPINEPHRINE (PF) 1 %-1:200000 IJ SOLN
INTRAMUSCULAR | Status: AC
  Administered 2014-03-16: 30 mL
  Filled 2014-03-16: qty 30

## 2014-03-16 MED ORDER — DIPHENHYDRAMINE HCL 25 MG PO CAPS
25.0000 mg | ORAL_CAPSULE | Freq: Two times a day (BID) | ORAL | Status: DC | PRN
  Administered 2014-03-16 – 2014-03-20 (×5): 25 mg via ORAL
  Filled 2014-03-16 (×5): qty 1

## 2014-03-16 MED ORDER — SODIUM POLYSTYRENE SULFONATE 15 GM/60ML PO SUSP
15.0000 g | Freq: Once | ORAL | Status: AC
  Administered 2014-03-16: 15 g via ORAL
  Filled 2014-03-16: qty 60

## 2014-03-16 MED ORDER — DOXYCYCLINE HYCLATE 100 MG IV SOLR
100.0000 mg | Freq: Two times a day (BID) | INTRAVENOUS | Status: DC
  Administered 2014-03-16 – 2014-03-19 (×6): 100 mg via INTRAVENOUS
  Filled 2014-03-16 (×7): qty 100

## 2014-03-16 MED ORDER — LACTULOSE 10 GM/15ML PO SOLN
20.0000 g | Freq: Once | ORAL | Status: AC
  Administered 2014-03-16: 20 g via ORAL
  Filled 2014-03-16: qty 30

## 2014-03-16 NOTE — ED Notes (Signed)
Pt seen by MD on Monday.  Pt had labs drawn and left hand checked out for swelling.  Labs were abnormal.  Pt has hbg of 8 and platelets were 80.  Pt started having abdominal pain yesterday.  Nausea with no vomiting.  No fever.  No diarrhea

## 2014-03-16 NOTE — ED Notes (Signed)
MD at bedside. 

## 2014-03-16 NOTE — H&P (Addendum)
[facility] History and Physical  Clifford Cook OEU:[id number] DOB: 10-02-1945 DOA: 03/16/2014  Referring physician: Dr. Sabra Heck PCP: [name], MD   Chief Complaint: abd pain  HPI: Clifford Cook is a 69 y.o. male past medical history of [name] and cirrhosis that comes in for abdominal pain and left upper extremity cellulitis He was recently seen by his PCP who increased his diuretic and start him on Keflex, hematemesis primary care doctor 2 days prior to admission as the Keflex was not helping and was making him nauseated. This she switch to Bactrim but he continued to get abdominal pain with fatigue which has progressively gotten worse. He relates his Lasix was increased and since then his swelling has not improved. He denies any fevers, cuts medications or sores.  In the ED: Basic metabolic panel showed hyponatremia hyperkalemia and acute renal failure lactic acid was checked and it was 2.3, a repeated one was 1.4 he does not have leukocytosis he has mild thrombocytopenia which is chronic, and FOBT was positive.   Review of Systems:  Constitutional:  No weight loss, night sweats, Fevers, chills, fatigue.  HEENT:  No headaches, Difficulty swallowing,Tooth/dental problems,Sore throat,  No sneezing, itching, ear ache, nasal congestion, post nasal drip,  Cardio-vascular:  No chest pain, Orthopnea, PND, swelling in lower extremities, anasarca, dizziness, palpitations  GI:  No nausea, vomiting, diarrhea, change in bowel habits, loss of appetite  Resp:  No shortness of breath with exertion or at rest. No excess mucus, no productive cough, No non-productive cough, No coughing up of blood.No change in color of mucus.No wheezing.No chest wall deformity  Skin:  no rash or lesions.  GU:  no dysuria, change in color of urine, no urgency or frequency. No flank pain.  Musculoskeletal:  No joint pain or swelling. No decreased range of motion. No back pain.  Psych:  No change in mood or  affect. No depression or anxiety. No memory loss.   Past Medical History  Diagnosis Date  . Stomach ulcer   . GI bleed   . Liver disorder   . Cirrhosis   . GERD (gastroesophageal reflux disease)    Past Surgical History  Procedure Laterality Date  . Cataract extraction, bilateral    . Surgery for lazy eye      age 79  . Esophagogastroduodenoscopy N/A 04/16/2012    Procedure: ESOPHAGOGASTRODUODENOSCOPY (EGD);  Surgeon: Jeryl Columbia, MD;  Location: [facility];  Service: Endoscopy;  Laterality: N/A;  . Esophagogastroduodenoscopy N/A 04/17/2012    Procedure: ESOPHAGOGASTRODUODENOSCOPY (EGD);  Surgeon: Jeryl Columbia, MD;  Location: [facility];  Service: Endoscopy;  Laterality: N/A;  . Direct laryngoscopy N/A 07/13/2013    Procedure: DIRECT LARYNGOSCOPY WITH EXCISION TUMOR;  Surgeon: Rozetta Nunnery, MD;  Location: [place];  Service: ENT;  Laterality: N/A;  . Hip pinning,cannulated Left 12/06/2013    Procedure: CANNULATED HIP PINNING;  Surgeon: Renette Butters, MD;  Location: [place];  Service: Orthopedics;  Laterality: Left;  . Tee without cardioversion N/A 12/13/2013    Procedure: TRANSESOPHAGEAL ECHOCARDIOGRAM (TEE);  Surgeon: Larey Dresser, MD;  Location: [facility] END[facility]COPY;  Service: Cardiovascular;  Laterality: N/A;   Social History:  reports that he has been smoking Cigarettes.  He has been smoking about 1.00 pack per day. He has never used smokeless tobacco. He reports that he does not drink alcohol or use illicit drugs.  Allergies  Allergen Reactions  . Pulmicort [Budesonide] Shortness Of Breath    Causes stridor  .  Penicillins Other (See Comments)    Rash hives, white spots.    Family History  Problem Relation Age of Onset  . Heart attack Mother   . Cirrhosis Sister    Prior to Admission medications   Medication Sig Start Date End Date Taking? Authorizing Provider  furosemide (LASIX) 40 MG tablet Take 40-80 mg by mouth. Take 2 Tablets (80 mg) By Mouth  Every AM & Take 1 Tablet (40 mg) At Lunch.   Yes Historical Provider, MD  Magnesium 250 MG TABS Take 250 mg by mouth daily.   Yes Historical Provider, MD  oxyCODONE (OXY IR/ROXICODONE) 5 MG immediate release tablet Take 1 tablet (5 mg total) by mouth every 6 (six) hours as needed for moderate pain. 12/14/13  Yes Nita Sells, MD  pantoprazole (PROTONIX) 40 MG tablet Take 1 tablet (40 mg total) by mouth daily. Patient taking differently: Take 40 mg by mouth 2 (two) times daily.  11/09/13  Yes Barton Dubois, MD  Polysaccharide Iron Complex (POLY-IRON 150 PO) Take 1 capsule by mouth daily.   Yes Historical Provider, MD  spironolactone (ALDACTONE) 100 MG tablet Take 100 mg by mouth daily.   Yes Historical Provider, MD  Vitamin D, Ergocalciferol, (DRISDOL) 50000 UNITS CAPS capsule Take 50,000 Units by mouth every 7 (seven) days.   Yes Historical Provider, MD  alendronate (FOSAMAX) 70 MG tablet Take 1 tablet (70 mg total) by mouth once a week. Hold until antibiotic therapy is completed 11/09/13   Barton Dubois, MD  cefTRIAXone (ROCEPHIN) 40 MG/ML IVPB Inject 50 mLs (2 g total) into the vein daily. Patient not taking: Reported on 01/31/2014 12/14/13   Nita Sells, MD  diphenhydrAMINE (BENADRYL) 25 mg capsule Take 1 capsule (25 mg total) by mouth every 12 (twelve) hours as needed for itching. 11/09/13   Barton Dubois, MD  docusate sodium (COLACE) 100 MG capsule Take 1 capsule (100 mg total) by mouth 2 (two) times daily. Patient not taking: Reported on 01/31/2014 12/06/13   Renette Butters, MD  feeding supplement, RESOURCE BREEZE, (RESOURCE BREEZE) LIQD Take 1 Container by mouth 3 (three) times daily between meals. Patient not taking: Reported on 12/05/2013 11/09/13   Barton Dubois, MD  ferrous sulfate 325 (65 FE) MG tablet Take 1 tablet (325 mg total) by mouth 2 (two) times daily with a meal. Patient not taking: Reported on 03/16/2014 11/09/13   Barton Dubois, MD  furosemide (LASIX) 20 MG tablet  Take 1 tablet (20 mg total) by mouth daily. Patient not taking: Reported on 01/31/2014 12/15/13   Nita Sells, MD  HYDROcodone-acetaminophen (NORCO) 5-325 MG per tablet Take 1-2 tablets by mouth every 4 (four) hours as needed for moderate pain. Patient not taking: Reported on 03/16/2014 12/06/13   Renette Butters, MD  metoprolol succinate (TOPROL-XL) 12.5 mg TB24 24 hr tablet Take 0.5 tablets (12.5 mg total) by mouth daily. Patient not taking: Reported on 01/31/2014 12/15/13   Nita Sells, MD  rifaximin (XIFAXAN) 550 MG TABS tablet Take 1 tablet (550 mg total) by mouth 2 (two) times daily. Patient not taking: Reported on 01/31/2014 12/14/13   Nita Sells, MD  sorbitol 70 % SOLN Take 20 mLs by mouth daily as needed for moderate constipation (goal is 1-2 good BM's daily). Patient not taking: Reported on 01/31/2014 11/09/13   Barton Dubois, MD  temazepam (RESTORIL) 7.5 MG capsule Take 1 capsule (7.5 mg total) by mouth at bedtime as needed for sleep. Patient not taking: Reported on 03/16/2014 11/09/13   Mahima  Bubba Camp, MD   Physical Exam: Filed Vitals:   03/16/14 1339 03/16/14 1344 03/16/14 1407 03/16/14 1409  BP:   102/61   Pulse:   92   Temp:   97.8 F (36.6 C)   TempSrc:   Oral   Resp: 16  16   Height:    5\' 9"  (1.753 m)  Weight:    91.173 kg (201 lb)  SpO2:  96% 98%     Wt Readings from Last 3 Encounters:  03/16/14 91.173 kg (201 lb)  12/14/13 96.2 kg (212 lb 1.3 oz)  11/25/13 104.327 kg (230 lb)    General:  Appears calm and comfortable Eyes: PERRL, normal lids, irises & conjunctiva ENT: grossly normal hearing, lips & tongue Neck: no LAD, masses or thyromegaly Cardiovascular: RRR, no m/r/g. No LE edema. Telemetry: SR, no arrhythmias  Respiratory: CTA bilaterally, no w/r/r. Normal respiratory effort. Abdomen: soft,significantly distended abdomen, soft no rebound or guarding.  Skin: no rash or induration seen on limited exam,multiple telangiectasia   Musculoskeletal: grossly normal tone BUE/BLE Psychiatric: grossly normal mood and affect, speech fluent and appropriate Neurologic: grossly non-focal.          Labs on Admission:  Basic Metabolic Panel:  Recent Labs Lab 03/16/14 0956  NA 133*  K 5.2*  CL 103  CO2 20  GLUCOSE 92  BUN 39*  CREATININE 2.81*  CALCIUM 7.6*   Liver Function Tests:  Recent Labs Lab 03/16/14 0956  AST 47*  ALT 31  ALKPHOS 126*  BILITOT 2.8*  PROT 5.3*  ALBUMIN 2.1*    Recent Labs Lab 03/16/14 0956  LIPASE 35    Recent Labs Lab 03/16/14 1045  AMMONIA 135*   CBC:  Recent Labs Lab 03/16/14 0956  WBC 5.7  NEUTROABS 3.6  HGB 8.0*  HCT 23.2*  MCV 91.3  PLT 76*   Cardiac Enzymes: No results for input(s): CKTOTAL, CKMB, CKMBINDEX, TROPONINI in the last 168 hours.  BNP (last 3 results) No results for input(s): BNP in the last 8760 hours.  ProBNP (last 3 results)  Recent Labs  11/15/13 1237  PROBNP 864.3*    CBG: No results for input(s): GLUCAP in the last 168 hours.  Radiological Exams on Admission: No results found.  EKG: Independently reviewed. None  Assessment/Plan AKI (acute kidney injury): - Unclear etiology, will go ahead and check urine sodium, creatinine and urine osmolarity. Could be due to recent changes in his diuretic as well as Bactrim use. - I will go ahead and hold the Bactrim and diuretics. - Check a renal ultrasound and recheck a basic metabolic panel in the morning. - Please lower extremity TED hose  Ascites/abdominal pain: - His abdominal pain is likely due to ascites at it has been getting progressively worse over the last week his abdominal distention. - He has not had a paracentesis in a while. - We'll consult IR for possible paracentesis.  Cellulitis of the left upper extremity: - We'll DC Bactrim we'll start him on IV doxycycline. - He status post I and D on 3.2.2016 by the emergency room physician.  Hyponatremia: - Chronic seems  to be at baseline. We'll continue to monitor.  Normocytic Anemia: - Most likely anemia of chronic disease. Seems to be at baseline.  Thrombocytopenia: - Most likely due to liver cirrhosis has been stable on change.  Hyperkalemia - Multifactorial due to acute kidney injury, Bactrim and potassium supplementation.  Poor oral hygiene with possible dental abscess: - I will consult a dentist for  possible inpatient procedure.  Code Status: full DVT Prophylaxis:SCD Family Communication: daughter and wife Disposition Plan: inpatient  Time spent: 35 minutes  Charlynne Cousins [facility] Pager [phone]

## 2014-03-16 NOTE — ED Provider Notes (Signed)
CSN: [id number]     Arrival date & time 03/16/14  0910 History   First MD Initiated Contact with Patient 03/16/14 [phone]     Chief Complaint  Patient presents with  . Abdominal Pain     (Consider location/radiation/quality/duration/timing/severity/associated sxs/prior Treatment) HPI   The patient is a 69 year old male, he has a history of cirrhosis of liver, he was recently seen at his doctor's office this week and found to be anemic with a hemoglobin of just over 8, he has also had decreased platelets which is a new problem,  he has been taking lactulose, he was recently put on Keflex for a hand infection, he reports having black tarry stools but is also taking iron supplementation. Other than that the patient has no complaints at this time though last night he had a significant amount of abdominal pain, back pain, arm pain and leg pain. He has had increased swelling in his lower extremities, he has been taking Lasix but has recently had some renal dysfunction and this was switched to torsemide. The symptoms are persistent, abdominal pain has improved, not associated with vomiting or fevers  Past Medical History  Diagnosis Date  . Stomach ulcer   . GI bleed   . Liver disorder   . Cirrhosis   . GERD (gastroesophageal reflux disease)    Past Surgical History  Procedure Laterality Date  . Cataract extraction, bilateral    . Surgery for lazy eye      age 31  . Esophagogastroduodenoscopy N/A 04/16/2012    Procedure: ESOPHAGOGASTRODUODENOSCOPY (EGD);  Surgeon: Jeryl Columbia, MD;  Location: [facility];  Service: Endoscopy;  Laterality: N/A;  . Esophagogastroduodenoscopy N/A 04/17/2012    Procedure: ESOPHAGOGASTRODUODENOSCOPY (EGD);  Surgeon: Jeryl Columbia, MD;  Location: [facility];  Service: Endoscopy;  Laterality: N/A;  . Direct laryngoscopy N/A 07/13/2013    Procedure: DIRECT LARYNGOSCOPY WITH EXCISION TUMOR;  Surgeon: Rozetta Nunnery, MD;  Location: [place];  Service:  ENT;  Laterality: N/A;  . Hip pinning,cannulated Left 12/06/2013    Procedure: CANNULATED HIP PINNING;  Surgeon: Renette Butters, MD;  Location: [place];  Service: Orthopedics;  Laterality: Left;  . Tee without cardioversion N/A 12/13/2013    Procedure: TRANSESOPHAGEAL ECHOCARDIOGRAM (TEE);  Surgeon: Larey Dresser, MD;  Location: [place];  Service: Cardiovascular;  Laterality: N/A;   History reviewed. No pertinent family history. History  Substance Use Topics  . Smoking status: Current Every Day Smoker -- 1.00 packs/day    Types: Cigarettes  . Smokeless tobacco: Never Used  . Alcohol Use: No     Comment: over i yr    Review of Systems  All other systems reviewed and are negative.     Allergies  Pulmicort and Penicillins  Home Medications   Prior to Admission medications   Medication Sig Start Date End Date Taking? Authorizing Provider  alendronate (FOSAMAX) 70 MG tablet Take 1 tablet (70 mg total) by mouth once a week. Hold until antibiotic therapy is completed 11/09/13   Barton Dubois, MD  cefTRIAXone (ROCEPHIN) 40 MG/ML IVPB Inject 50 mLs (2 g total) into the vein daily. Patient not taking: Reported on 01/31/2014 12/14/13   Nita Sells, MD  diphenhydrAMINE (BENADRYL) 25 mg capsule Take 1 capsule (25 mg total) by mouth every 12 (twelve) hours as needed for itching. 11/09/13   Barton Dubois, MD  docusate sodium (COLACE) 100 MG capsule Take 1 capsule (100 mg total) by mouth 2 (two) times daily. Patient not  taking: Reported on 01/31/2014 12/06/13   Renette Butters, MD  feeding supplement, RESOURCE BREEZE, (RESOURCE BREEZE) LIQD Take 1 Container by mouth 3 (three) times daily between meals. Patient not taking: Reported on 12/05/2013 11/09/13   Barton Dubois, MD  ferrous sulfate 325 (65 FE) MG tablet Take 1 tablet (325 mg total) by mouth 2 (two) times daily with a meal. 11/09/13   Barton Dubois, MD  furosemide (LASIX) 20 MG tablet Take 1 tablet (20 mg total) by mouth  daily. Patient not taking: Reported on 01/31/2014 12/15/13   Nita Sells, MD  furosemide (LASIX) 40 MG tablet Take 40-80 mg by mouth. Take 2 Tablets (80 mg) By Mouth Every AM & Take 1 Tablet (40 mg) Every PM.    Historical Provider, MD  HYDROcodone-acetaminophen (NORCO) 5-325 MG per tablet Take 1-2 tablets by mouth every 4 (four) hours as needed for moderate pain. 12/06/13   Renette Butters, MD  lactulose (CHRONULAC) 10 GM/15ML solution Take 20 g by mouth 3 (three) times daily.     Historical Provider, MD  Magnesium 250 MG TABS Take 250 mg by mouth daily.    Historical Provider, MD  metoprolol succinate (TOPROL-XL) 12.5 mg TB24 24 hr tablet Take 0.5 tablets (12.5 mg total) by mouth daily. Patient not taking: Reported on 01/31/2014 12/15/13   Nita Sells, MD  Multiple Vitamins-Minerals (MULTIVITAMIN & MINERAL PO) Take 1 tablet by mouth daily.    Historical Provider, MD  oxyCODONE (OXY IR/ROXICODONE) 5 MG immediate release tablet Take 1 tablet (5 mg total) by mouth every 6 (six) hours as needed for moderate pain. 12/14/13   Nita Sells, MD  pantoprazole (PROTONIX) 40 MG tablet Take 1 tablet (40 mg total) by mouth daily. Patient taking differently: Take 40 mg by mouth 2 (two) times daily.  11/09/13   Barton Dubois, MD  Polysaccharide Iron Complex (POLY-IRON 150 PO) Take 1 capsule by mouth daily.    Historical Provider, MD  potassium chloride 20 MEQ/15ML (10%) solution Take 40 mEq by mouth daily.    Historical Provider, MD  rifaximin (XIFAXAN) 550 MG TABS tablet Take 1 tablet (550 mg total) by mouth 2 (two) times daily. Patient not taking: Reported on 01/31/2014 12/14/13   Nita Sells, MD  sorbitol 70 % SOLN Take 20 mLs by mouth daily as needed for moderate constipation (goal is 1-2 good BM's daily). Patient not taking: Reported on 01/31/2014 11/09/13   Barton Dubois, MD  spironolactone (ALDACTONE) 100 MG tablet Take 100 mg by mouth daily.    Historical Provider, MD  temazepam  (RESTORIL) 7.5 MG capsule Take 1 capsule (7.5 mg total) by mouth at bedtime as needed for sleep. 11/09/13   Mahima Pandey, MD   BP 93/54 mmHg  Pulse 95  Temp(Src) 97.7 F (36.5 C) (Oral)  Resp 16  SpO2 96% Physical Exam  Constitutional: He appears well-developed and well-nourished. No distress.  HENT:  Head: Normocephalic and atraumatic.  Mouth/Throat: Oropharynx is clear and moist. No oropharyngeal exudate.  Eyes: Conjunctivae and EOM are normal. Pupils are equal, round, and reactive to light. Right eye exhibits no discharge. Left eye exhibits no discharge. No scleral icterus.  Neck: Normal range of motion. Neck supple. No JVD present. No thyromegaly present.  Cardiovascular: Normal rate, regular rhythm, normal heart sounds and intact distal pulses.  Exam reveals no gallop and no friction rub.   No murmur heard. Pulmonary/Chest: Effort normal and breath sounds normal. No respiratory distress. He has no wheezes. He has no rales.  Abdominal: Soft. Bowel sounds are normal. He exhibits distension. He exhibits no mass. There is no tenderness.  Moderate abdominal distention, tight, nontender, see bedside ultrasound report  Musculoskeletal: Normal range of motion. He exhibits edema (significant bilateral pitting edema to the lower extremities which extends onto the abdominal wall). He exhibits no tenderness.  Lymphadenopathy:    He has no cervical adenopathy.  Neurological: He is alert. Coordination normal.  Skin: Skin is warm and dry. No rash noted. No erythema.  Psychiatric: He has a normal mood and affect. His behavior is normal.  Nursing note and vitals reviewed.   ED Course  Procedures (including critical care time) Labs Review Labs Reviewed  CBC WITH DIFFERENTIAL/PLATELET  COMPREHENSIVE METABOLIC PANEL  LIPASE, BLOOD  URINALYSIS, ROUTINE W REFLEX MICROSCOPIC  AMMONIA    Imaging Review No results found.   MDM   Final diagnoses:  None    The patient does have a  cellulitis on the posterior dorsum of the left hand with a small fluctuant area over the knuckle consistent with an abscess. He has cellulitis of the hand, abdominal distention which on ultrasound shows ascites or fluid in the abdomen, minimal tenderness, recheck labs, anticipate admission, will need incision and drainage over abscess.  INCISION AND DRAINAGE Performed by: Noemi Chapel D Consent: Verbal consent obtained. Risks and benefits: risks, benefits and alternatives were discussed Type: abscess  Body area: L middle MCP joint dorsum hand  Anesthesia: local infiltration  Incision was made with a scalpel.  Local anesthetic: lidocaine 1% with epinephrine  Anesthetic total: 1 ml  Complexity: complex Blunt dissection to break up loculations  Drainage: purulent  Drainage amount: small  Packing material: irrigated and left open to drain  Patient tolerance: Patient tolerated the procedure well with no immediate complications.     D/w Dr. Venetia Constable - will admit.     Johnna Acosta, MD 03/16/14 2116

## 2014-03-16 NOTE — ED Notes (Signed)
Huie Ghuman, Daughter [phone] Patients family has stepped out for get a snack, asked to be contacted if need be

## 2014-03-16 NOTE — Progress Notes (Signed)
PHARMACIST - PHYSICIAN COMMUNICATION  CONCERNING: P&T Medication Policy Regarding Oral Bisphosphonates  RECOMMENDATION: Your order for alendronate (Fosamax), ibandronate (Boniva), or risedronate (Actonel) has been discontinued at this time.  If the patient's post-hospital medical condition warrants safe use of this class of drugs, please resume the pre-hospital regimen upon discharge.  DESCRIPTION:  Alendronate (Fosamax), ibandronate (Boniva), and risedronate (Actonel) can cause severe esophageal erosions in patients who are unable to remain upright at least 30 minutes after taking this medication.   Since brief interruptions in therapy are thought to have minimal impact on bone mineral density, the [facility] has established that bisphosphonate orders should be routinely discontinued during hospitalization.   To override this safety policy and permit administration of Boniva, Fosamax, or Actonel in the hospital, prescribers must write "DO NOT HOLD" in the comments section when placing the order for this class of medications.  Romeo Rabon, PharmD, pager [phone]. 03/16/2014,3:35 PM.

## 2014-03-17 ENCOUNTER — Inpatient Hospital Stay (HOSPITAL_COMMUNITY): Payer: Medicare Other

## 2014-03-17 DIAGNOSIS — R601 Generalized edema: Secondary | ICD-10-CM

## 2014-03-17 DIAGNOSIS — L02519 Cutaneous abscess of unspecified hand: Secondary | ICD-10-CM | POA: Insufficient documentation

## 2014-03-17 LAB — COMPREHENSIVE METABOLIC PANEL
ALT: 31 U/L (ref 0–53)
AST: 43 U/L — ABNORMAL HIGH (ref 0–37)
Albumin: 1.9 g/dL — ABNORMAL LOW (ref 3.5–5.2)
Alkaline Phosphatase: 110 U/L (ref 39–117)
Anion gap: 9 (ref 5–15)
BUN: 37 mg/dL — ABNORMAL HIGH (ref 6–23)
CHLORIDE: 102 mmol/L (ref 96–112)
CO2: 21 mmol/L (ref 19–32)
Calcium: 7.5 mg/dL — ABNORMAL LOW (ref 8.4–10.5)
Creatinine, Ser: 2.33 mg/dL — ABNORMAL HIGH (ref 0.50–1.35)
GFR calc non Af Amer: 27 mL/min — ABNORMAL LOW (ref >90)
GFR, EST AFRICAN AMERICAN: 31 mL/min — AB (ref >90)
Glucose, Bld: 149 mg/dL — ABNORMAL HIGH (ref 70–99)
POTASSIUM: 4.2 mmol/L (ref 3.5–5.1)
Sodium: 132 mmol/L — ABNORMAL LOW (ref 135–145)
TOTAL PROTEIN: 5.1 g/dL — AB (ref 6.0–8.3)
Total Bilirubin: 2.3 mg/dL — ABNORMAL HIGH (ref 0.3–1.2)

## 2014-03-17 LAB — CBC
HCT: 20.9 % — ABNORMAL LOW (ref 39.0–52.0)
Hemoglobin: 7.1 g/dL — ABNORMAL LOW (ref 13.0–17.0)
MCH: 31.3 pg (ref 26.0–34.0)
MCHC: 34 g/dL (ref 30.0–36.0)
MCV: 92.1 fL (ref 78.0–100.0)
Platelets: 85 10*3/uL — ABNORMAL LOW (ref 150–400)
RBC: 2.27 MIL/uL — ABNORMAL LOW (ref 4.22–5.81)
RDW: 19.2 % — ABNORMAL HIGH (ref 11.5–15.5)
WBC: 7 10*3/uL (ref 4.0–10.5)

## 2014-03-17 LAB — CREATININE, URINE, RANDOM: CREATININE, URINE: 122.5 mg/dL

## 2014-03-17 LAB — PREPARE RBC (CROSSMATCH)

## 2014-03-17 LAB — OSMOLALITY, URINE: Osmolality, Ur: 455 mOsm/kg (ref 390–1090)

## 2014-03-17 LAB — SODIUM, URINE, RANDOM: SODIUM UR: 20 meq/L

## 2014-03-17 MED ORDER — SODIUM CHLORIDE 0.9 % IV SOLN
Freq: Once | INTRAVENOUS | Status: AC
  Administered 2014-03-17: 15:00:00 via INTRAVENOUS

## 2014-03-17 MED ORDER — ALBUMIN HUMAN 25 % IV SOLN
25.0000 g | Freq: Four times a day (QID) | INTRAVENOUS | Status: AC
  Administered 2014-03-17 (×2): 25 g via INTRAVENOUS
  Filled 2014-03-17 (×2): qty 100

## 2014-03-17 NOTE — Procedures (Signed)
  LLQ US guided para  4.9 liters yellow fluid  Tolerated well BP stable

## 2014-03-17 NOTE — Progress Notes (Signed)
Clifford Cook 5:47 PM  Subjective: Patient well-known to me from multiple outpatient visits and asked by the wife to come by and see him and he was evaluated after paracentesis and is feeling much better and his last week of increased edema and increased diuretics and his abscess was discussed and he has no other complaints and his case was discussed with the hospital team as well  Objective: Vital signs stable afebrile no acute distress abdomen is soft nontender after paracentesis labs reviewed BUN and creatinine improving but his baseline was fairly normal  Assessment: Multiple medical problems including cirrhosis complicated with fluid problems and occasional encephalopathy and bleeding in the past from portal gastropathy  Plan: Agree with holding diuretics for now using blood products or albumin with paracentesis and we will follow with you  [facility]

## 2014-03-17 NOTE — H&P (Signed)
[facility] PROGRESS NOTE  Assessment/Plan: AKI (acute kidney injury) - Urine Sodium was 20, his renal function has improved with holding diuretics. - I will continue to hold diuretics for today continue to monitor his electrolytes. - Restrict his fluid ingestion. - He will have to receive a unit of blood which will help with volume expansion.  Ascites - Paracentesis was done that revealed 4.9 L we'll go ahead and give him IV albumin. - He relates his abdominal discomfort is improved.  Normocytic  Anemia: - His hemoglobin dropped from 8.1-7.1 he relates he feels little bit more tired today. - Go ahead and give him a unit of packed red blood cells. CBC in the morning.  Hyponatremia: - Likely due to the failure, only to continue to hold the Aldactone as his serum creatinine is 2.3.  Thrombocytopenia - Improving, no signs of petechiae or bleeding.  Abscess of hand - Status post I&D has remained afebrile. - Continue IV vancomycin.  Anasarca - Apply TED hose.  Hyperkalemia - Multifactorial due to acute kidney injury, Bactrim, Aldactone indicated.   Poor oral hygiene: - Consulted the dentist.     Code Status: Full Family Communication: daughter and wife  Disposition Plan: inpatient   Consultants:  None  Procedures:  Interventional radiologist  Antibiotics:  Vancomycin 3.2.2016  HPI/Subjective: He relates his abdominal discomfort is improved  Objective: Filed Vitals:   03/17/14 1032 03/17/14 1037 03/17/14 1042 03/17/14 1048  BP: 108/55 107/48 105/50 114/54  Pulse:      Temp:      TempSrc:      Resp:      Height:      Weight:      SpO2:        Intake/Output Summary (Last 24 hours) at 03/17/14 1235 Last data filed at 03/16/14 1530  Gross per 24 hour  Intake    100 ml  Output      0 ml  Net    100 ml   Filed Weights   03/16/14 1409 03/17/14 0705  Weight: 91.173 kg (201 lb) 91.7 kg (202 lb 2.6 oz)    Exam:  General: Alert, awake,  oriented x3, in no acute distress.  HEENT: No bruits, no goiter.  Heart: Regular rate and rhythm. Lungs: Good air movement, clear Abdomen: Soft, nontender, nondistended, positive bowel sounds.  Neuro: Grossly intact, nonfocal.   Data Reviewed: Basic Metabolic Panel:  Recent Labs Lab 03/16/14 0956 03/17/14 0540  NA 133* 132*  K 5.2* 4.2  CL 103 102  CO2 20 21  GLUCOSE 92 149*  BUN 39* 37*  CREATININE 2.81* 2.33*  CALCIUM 7.6* 7.5*   Liver Function Tests:  Recent Labs Lab 03/16/14 0956 03/17/14 0540  AST 47* 43*  ALT 31 31  ALKPHOS 126* 110  BILITOT 2.8* 2.3*  PROT 5.3* 5.1*  ALBUMIN 2.1* 1.9*    Recent Labs Lab 03/16/14 0956  LIPASE 35    Recent Labs Lab 03/16/14 1045  AMMONIA 135*   CBC:  Recent Labs Lab 03/16/14 0956 03/17/14 0540  WBC 5.7 7.0  NEUTROABS 3.6  --   HGB 8.0* 7.1*  HCT 23.2* 20.9*  MCV 91.3 92.1  PLT 76* 85*   Cardiac Enzymes: No results for input(s): CKTOTAL, CKMB, CKMBINDEX, TROPONINI in the last 168 hours. BNP (last 3 results) No results for input(s): BNP in the last 8760 hours.  ProBNP (last 3 results)  Recent Labs  11/15/13 1237  PROBNP 864.3*    CBG: No  results for input(s): GLUCAP in the last 168 hours.  No results found for this or any previous visit (from the past 240 hour(s)).   Studies: No results found.  Scheduled Meds: . doxycycline (VIBRAMYCIN) IV  100 mg Intravenous Q12H  . feeding supplement (RESOURCE BREEZE)  1 Container Oral TID BM  . iron polysaccharides  150 mg Oral Q breakfast  . magnesium gluconate  250 mg Oral Daily  . pantoprazole  40 mg Oral Daily  . rifaximin  550 mg Oral BID  . sodium chloride  3 mL Intravenous Q12H  . sodium chloride  3 mL Intravenous Q12H   Continuous Infusions:    Charlynne Cousins  [facility] Pager [phone]. If 7PM-7AM, please contact night-coverage at [email or web address], password [facility] 03/17/2014, 12:35 PM  LOS: 1 day

## 2014-03-18 ENCOUNTER — Inpatient Hospital Stay (HOSPITAL_COMMUNITY): Payer: Medicare Other

## 2014-03-18 ENCOUNTER — Encounter (HOSPITAL_COMMUNITY): Payer: Self-pay | Admitting: Dentistry

## 2014-03-18 DIAGNOSIS — K7460 Unspecified cirrhosis of liver: Secondary | ICD-10-CM

## 2014-03-18 LAB — BASIC METABOLIC PANEL
ANION GAP: 5 (ref 5–15)
BUN: 23 mg/dL (ref 6–23)
CALCIUM: 7.5 mg/dL — AB (ref 8.4–10.5)
CO2: 22 mmol/L (ref 19–32)
Chloride: 104 mmol/L (ref 96–112)
Creatinine, Ser: 1.43 mg/dL — ABNORMAL HIGH (ref 0.50–1.35)
GFR calc non Af Amer: 48 mL/min — ABNORMAL LOW (ref >90)
GFR, EST AFRICAN AMERICAN: 56 mL/min — AB (ref >90)
Glucose, Bld: 117 mg/dL — ABNORMAL HIGH (ref 70–99)
Potassium: 3.8 mmol/L (ref 3.5–5.1)
SODIUM: 131 mmol/L — AB (ref 135–145)

## 2014-03-18 LAB — CBC
HEMATOCRIT: 22 % — AB (ref 39.0–52.0)
Hemoglobin: 7.4 g/dL — ABNORMAL LOW (ref 13.0–17.0)
MCH: 31.4 pg (ref 26.0–34.0)
MCHC: 33.6 g/dL (ref 30.0–36.0)
MCV: 93.2 fL (ref 78.0–100.0)
Platelets: 66 10*3/uL — ABNORMAL LOW (ref 150–400)
RBC: 2.36 MIL/uL — AB (ref 4.22–5.81)
RDW: 18.4 % — ABNORMAL HIGH (ref 11.5–15.5)
WBC: 5.5 10*3/uL (ref 4.0–10.5)

## 2014-03-18 LAB — TYPE AND SCREEN
ABO/RH(D): O NEG
Antibody Screen: NEGATIVE
Unit division: 0

## 2014-03-18 MED ORDER — CHLORHEXIDINE GLUCONATE 0.12 % MT SOLN
15.0000 mL | Freq: Two times a day (BID) | OROMUCOSAL | Status: DC
  Administered 2014-03-18 – 2014-03-21 (×7): 15 mL via OROMUCOSAL
  Filled 2014-03-18 (×7): qty 15

## 2014-03-18 MED ORDER — SPIRONOLACTONE 25 MG PO TABS
25.0000 mg | ORAL_TABLET | Freq: Every day | ORAL | Status: DC
  Administered 2014-03-18 – 2014-03-19 (×2): 25 mg via ORAL
  Filled 2014-03-18 (×2): qty 1

## 2014-03-18 MED ORDER — LIDOCAINE VISCOUS 2 % MT SOLN
10.0000 mL | OROMUCOSAL | Status: DC | PRN
  Administered 2014-03-19 – 2014-03-20 (×3): 10 mL via OROMUCOSAL
  Filled 2014-03-18 (×5): qty 10

## 2014-03-18 MED ORDER — LACTULOSE 10 GM/15ML PO SOLN
20.0000 g | Freq: Every day | ORAL | Status: DC
  Administered 2014-03-18 – 2014-03-21 (×4): 20 g via ORAL
  Filled 2014-03-18 (×4): qty 30

## 2014-03-18 MED ORDER — VITAMIN D (ERGOCALCIFEROL) 1.25 MG (50000 UNIT) PO CAPS
50000.0000 [IU] | ORAL_CAPSULE | ORAL | Status: DC
  Administered 2014-03-18: 50000 [IU] via ORAL
  Filled 2014-03-18: qty 1

## 2014-03-18 MED ORDER — TEMAZEPAM 7.5 MG PO CAPS: 7.5000 mg | ORAL_CAPSULE | Freq: Every evening | ORAL | Status: DC | PRN

## 2014-03-18 MED ORDER — METOPROLOL SUCCINATE ER 25 MG PO TB24
12.5000 mg | ORAL_TABLET | Freq: Every day | ORAL | Status: DC
  Administered 2014-03-18 – 2014-03-20 (×2): 12.5 mg via ORAL
  Filled 2014-03-18 (×4): qty 1

## 2014-03-18 MED ORDER — MAGNESIUM 200 MG PO TABS: 200.0000 mg | ORAL_TABLET | Freq: Every day | ORAL | Status: DC

## 2014-03-18 MED ORDER — ALBUMIN HUMAN 25 % IV SOLN
25.0000 g | Freq: Four times a day (QID) | INTRAVENOUS | Status: AC
  Administered 2014-03-18 (×2): 25 g via INTRAVENOUS
  Filled 2014-03-18 (×2): qty 100

## 2014-03-18 NOTE — Progress Notes (Signed)
Clifford Cook 9:38 AM  Subjective: Patient doing better today however his main problem  is his mouth right now with multiple teeth problem  Objective: Vital signs stable afebrile no acute distress abdomen is soft nontender probably some ascites decreased by mouth edema decreased BUN and creatinine  Assessment: Improved  Plan: If BUN and creatinine continue to drop can resume all duct cannot 100 mg and Lasix 40 mg tomorrow and please call my partner if we are needed any further during this hospital stay otherwise I will see back in the office in a few weeks to recheck labs and fluid status and recommend oral surgery consult  [facility]

## 2014-03-18 NOTE — Procedures (Signed)
US guided therapeutic paracentesis performed yielding 3.6 liters yellow fluid. No immediate complications. 

## 2014-03-18 NOTE — Consult Note (Signed)
DENTAL CONSULTATION  Date of Consultation:  03/18/2014 Patient Name:   Clifford Cook Date of Birth:   [birth year] Medical Record Number: [id number]  VITALS: BP 115/57 mmHg  Pulse 95  Temp(Src) 98.4 F (36.9 C) (Oral)  Resp 18  Ht 5\' 9"  (1.753 m)  Wt 199 lb 11.8 oz (90.6 kg)  BMI 29.48 kg/m2  SpO2 95%  CHIEF COMPLAINT: Patient referred for evaluation of possible dental abscess.   HPI: Clifford Cook is a 69 year old male with history of liver cirrhosis secondary to NASH as well as thrombocytopenia. Patient admitted for evaluation of abscess of hand. Dental consultation was requested to evaluate poor dentition.  The patient currently denies acute toothaches but is complaining of oral ulceration secondary to dental trauma. Patient also with a history of streptococcal viridans bacteremia in November or December 2015 that was thought to be secondary to dental infection.  Patient recently saw an oral surgeon on Tuesday, Dr. Weston Settle, that recommended dental treatment in hospital setting.  Patient was previously seen by his primary dentist, Dr. Mirna Mires, a few years ago. Patient had been seeing the dentist every 6 months for many years until his medical problems prevented him from keeping routine dental care. Patient also indicates he had a history of oral Fosamax therapy was recently discontinued. . Patient relates less than a years worth of treatment.   PROBLEM LIST: Patient Active Problem List   Diagnosis Date Noted  . Abscess of hand   . Anasarca   . AKI (acute kidney injury) 03/16/2014  . Hyperkalemia 03/16/2014  . Atrial flutter 12/08/2013  . Bacteremia due to Streptococcus   . Encephalopathy, hepatic   . Hip fracture 12/05/2013  . SIRS (systemic inflammatory response syndrome) 12/05/2013  . Lactic acidosis 12/05/2013  . Acute confusion 11/25/2013  . Coagulopathy 11/12/2013  . General weakness 11/12/2013  . Bleeding from PICC line 11/11/2013  . Hypokalemia 11/11/2013  . Sepsis  10/31/2013  . Liver cirrhosis secondary to NASH 10/31/2013  . Pneumonia due to streptococcus, group A 10/31/2013  . Ascites 10/31/2013  . Elevated troponin 10/31/2013  . Generalized weakness 10/31/2013  . Hyponatremia 10/31/2013  . Anemia 10/31/2013  . Thrombocytopenia 10/31/2013  . Respiratory failure with hypoxia 10/31/2013  . Rib fracture 10/31/2013  . H/O: GI bleed 04/16/2012  . Tobacco abuse 04/16/2012    PMH: Past Medical History  Diagnosis Date  . Stomach ulcer   . GI bleed   . Liver disorder   . Cirrhosis   . GERD (gastroesophageal reflux disease)     PSH: Past Surgical History  Procedure Laterality Date  . Cataract extraction, bilateral    . Surgery for lazy eye      age 74  . Esophagogastroduodenoscopy N/A 04/16/2012    Procedure: ESOPHAGOGASTRODUODENOSCOPY (EGD);  Surgeon: Jeryl Columbia, MD;  Location: [facility];  Service: Endoscopy;  Laterality: N/A;  . Esophagogastroduodenoscopy N/A 04/17/2012    Procedure: ESOPHAGOGASTRODUODENOSCOPY (EGD);  Surgeon: Jeryl Columbia, MD;  Location: [facility];  Service: Endoscopy;  Laterality: N/A;  . Direct laryngoscopy N/A 07/13/2013    Procedure: DIRECT LARYNGOSCOPY WITH EXCISION TUMOR;  Surgeon: Rozetta Nunnery, MD;  Location: [place];  Service: ENT;  Laterality: N/A;  . Hip pinning,cannulated Left 12/06/2013    Procedure: CANNULATED HIP PINNING;  Surgeon: Renette Butters, MD;  Location: [place];  Service: Orthopedics;  Laterality: Left;  . Tee without cardioversion N/A 12/13/2013    Procedure: TRANSESOPHAGEAL ECHOCARDIOGRAM (TEE);  Surgeon: Elby Showers  Aundra Dubin, MD;  Location[facility];  Service: Cardiovascular;  Laterality: N/A;    ALLERGIES: Allergies  Allergen Reactions  . Pulmicort [Budesonide] Shortness Of Breath    Causes stridor  . Penicillins Other (See Comments)    Rash hives, white spots.    MEDICATIONS: Current Facility-Administered Medications  Medication Dose Route Frequency Provider  Last Rate Last Dose  . 0.9 %  sodium chloride infusion  250 mL Intravenous PRN Charlynne Cousins, MD      . acetaminophen (TYLENOL) tablet 650 mg  650 mg Oral Q6H PRN Charlynne Cousins, MD       Or  . acetaminophen (TYLENOL) suppository 650 mg  650 mg Rectal Q6H PRN Charlynne Cousins, MD      . albumin human 25 % solution 25 g  25 g Intravenous Q6H Charlynne Cousins, MD      . chlorhexidine (PERIDEX) 0.12 % solution 15 mL  15 mL Mouth/Throat BID Lenn Cal, DDS      . diphenhydrAMINE (BENADRYL) capsule 25 mg  25 mg Oral Q12H PRN Charlynne Cousins, MD   25 mg at 03/17/14 2255  . doxycycline (VIBRAMYCIN) 100 mg in dextrose 5 % 250 mL IVPB  100 mg Intravenous Q12H Charlynne Cousins, MD   100 mg at 03/18/14 0427  . feeding supplement (RESOURCE BREEZE) (RESOURCE BREEZE) liquid 1 Container  1 Container Oral TID BM Charlynne Cousins, MD   1 Container at 03/18/14 [phone]  . iron polysaccharides (NIFEREX) capsule 150 mg  150 mg Oral Q breakfast Charlynne Cousins, MD   150 mg at 03/18/14 0831  . lactulose (CHRONULAC) 10 GM/15ML solution 20 g  20 g Oral Daily Charlynne Cousins, MD      . lidocaine (XYLOCAINE) 2 % viscous mouth solution 10 mL  10 mL Mouth/Throat Q4H PRN Lenn Cal, DDS      . magnesium gluconate (MAGONATE) tablet 250 mg  250 mg Oral Daily Charlynne Cousins, MD   250 mg at 03/18/14 4627  . metoprolol succinate (TOPROL-XL) 24 hr tablet 12.5 mg  12.5 mg Oral Daily Charlynne Cousins, MD      . ondansetron [facility]) tablet 4 mg  4 mg Oral Q6H PRN Charlynne Cousins, MD       Or  . ondansetron [facility]) injection 4 mg  4 mg Intravenous Q6H PRN Charlynne Cousins, MD      . oxyCODONE (Oxy IR/ROXICODONE) immediate release tablet 5 mg  5 mg Oral Q6H PRN Charlynne Cousins, MD   5 mg at 03/18/14 1213  . pantoprazole (PROTONIX) EC tablet 40 mg  40 mg Oral Daily Charlynne Cousins, MD   40 mg at 03/18/14 0350  . polyethylene glycol (MIRALAX / GLYCOLAX) packet 17 g  17  g Oral Daily PRN Charlynne Cousins, MD      . rifaximin Doreene Nest) tablet 550 mg  550 mg Oral BID Charlynne Cousins, MD   550 mg at 03/18/14 0938  . sodium chloride 0.9 % injection 3 mL  3 mL Intravenous Q12H Charlynne Cousins, MD   3 mL at 03/18/14 0942  . sodium chloride 0.9 % injection 3 mL  3 mL Intravenous Q12H Charlynne Cousins, MD   3 mL at 03/18/14 0937  . sodium chloride 0.9 % injection 3 mL  3 mL Intravenous PRN Charlynne Cousins, MD      . spironolactone (ALDACTONE) tablet 25 mg  25 mg Oral  Daily Charlynne Cousins, MD      . temazepam (RESTORIL) capsule 7.5 mg  7.5 mg Oral QHS PRN Charlynne Cousins, MD      . Vitamin D (Ergocalciferol) (DRISDOL) capsule 50,000 Units  50,000 Units Oral Q7 days Charlynne Cousins, MD        LABS: Lab Results  Component Value Date   WBC 5.5 03/18/2014   HGB 7.4* 03/18/2014   HCT 22.0* 03/18/2014   MCV 93.2 03/18/2014   PLT 66* 03/18/2014      Component Value Date/Time   NA 131* 03/18/2014 0442   K 3.8 03/18/2014 0442   CL 104 03/18/2014 0442   CO2 22 03/18/2014 0442   GLUCOSE 117* 03/18/2014 0442   BUN 23 03/18/2014 0442   CREATININE 1.43* 03/18/2014 0442   CALCIUM 7.5* 03/18/2014 0442   GFRNONAA 48* 03/18/2014 0442   GFRAA 56* 03/18/2014 0442   Lab Results  Component Value Date   INR 1.66* 12/14/2013   INR 1.58* 12/06/2013   INR 1.50* 12/05/2013   No results found for: PTT  SOCIAL HISTORY: History   Social History  . Marital Status: Married    Spouse Name: N/A  . Number of Children: N/A  . Years of Education: N/A   Occupational History  . Not on file.   Social History Main Topics  . Smoking status: Current Every Day Smoker -- 1.00 packs/day    Types: Cigarettes  . Smokeless tobacco: Never Used  . Alcohol Use: No     Comment: over i yr  . Drug Use: No  . Sexual Activity: No   Other Topics Concern  . Not on file   Social History Narrative   Patient is married. He is independent.    FAMILY  HISTORY: Family History  Problem Relation Age of Onset  . Heart attack Mother   . Cirrhosis Sister     REVIEW OF SYSTEMS: Reviewed from chart for this admission.  DENTAL HISTORY: CHIEF COMPLAINT: Patient referred for evaluation of possible dental abscess.   HPI: HASKELL RIHN is a 69 year old male with history of liver cirrhosis secondary to NASH as well as thrombocytopenia. Patient admitted for evaluation of abscess of hand. Dental consultation was requested to evaluate poor dentition.  The patient currently denies acute toothaches but is complaining of oral ulceration secondary to dental trauma. Patient also with a history of streptococcal viridans bacteremia in November or December 2015 that was thought to be secondary to dental infection.  Patient recently saw an oral surgeon on Tuesday, Dr. Weston Settle, that recommended dental treatment in hospital setting.  Patient was previously seen by his primary dentist, Dr. Mirna Mires, a few years ago. Patient had been seeing the dentist every 6 months for many years until his medical problems prevented him from keeping routine dental care.   DENTAL EXAMINATION: GENERAL: The patient is a well-developed male in no acute distress. HEAD AND NECK: There is no palpable submandibular lymphadenopathy. The patient denies acute TMJ symptoms. INTRAORAL EXAM: Patient has xerostomia. Patient has generalized erythema involving his oral tissues. DENTITION: The patient has multiple missing teeth and multiple retained root segments. I would need a full series of dental radius to identify the exact tooth numbers present/missing. The last orthopantogram taken on 12/08/13 is suboptimal. PERIODONTAL: The patient has chronic periodontitis with plaque and calculus Relations, Almyra Free gingival recession, and generalized tooth mobility. DENTAL CARIES/SUBOPTIMAL RESTORATIONS: Patient has rampant dental caries affecting the remaining dentition. ENDODONTIC: Patient has a history  of acute pulpitis symptoms. The patient does appear to have areas of periapical pathology. CROWN AND BRIDGE: The patient has a crown restoration on the upper right molar. PROSTHODONTIC: Patient is no longer wearing partial dentures. OCCLUSION: Patient has a poor occlusal scheme secondary to multiple missing teeth, supra-eruption and drifting of the unopposed teeth into the edentulous areas, and lack replacement of missing teeth with clinically acceptable dental prostheses.  RADIOGRAPHIC INTERPRETATION: A suboptimal orthopantogram was taken on 12/08/2013. Please note: There is no longer an orthopantogram at [facility]-- radiology department. There are multiple missing teeth. There are multiple retained root segments. There is moderate to severe bone loss. There appears to be areas of peri-apical radiolucency. Multiple dental caries noted. Multiple suboptimal dental restorations noted. Multiple teeth with previous root canal therapies are noted.  ASSESSMENTS: 1. Liver cirrhosis secondary to NASH 2. Thrombocytopenia 3. History of acute pulpitis symptoms 4. Chronic apical periodontitis 5. Chronic periodontitis with bone loss 6. Rampant dental caries 7. Multiple missing teeth 8. Multiple retained root segments 9. Supra-eruption and drifting of the unopposed teeth into the edentulous areas 10. Risk for bleeding with anticipated invasive dental procedures due to thrombocytopenia and coagulopathy 11. Risk for osteonecrosis of the jaw related to previous oral bisphosphonate therapy  PLAN/RECOMMENDATIONS: 1. I discussed the risks, benefits, and complications of various treatment options with the patient in relationship to his medical and dental conditions. We discussed various treatment options to include no treatment, multiple extractions with alveoloplasty, pre-prosthetic surgery as indicated, periodontal therapy, dental restorations, root canal therapy, crown and bridge therapy, implant  therapy, and replacement of missing teeth as indicated. The patient  and family currently wish to proceed with extraction of remaining teeth with alveoloplasty and pre-prosthetic surgery as indicated in the operating room with general anesthesia.  The patient will need to follow-up with dental medicine as an outpatient for the obtaining of peri-apical radiographs and additional orthopantogram and then have a more thorough discussion of the plan of care for the patient. The dental extractions will be coordinated with Dr. Watt Climes with possible admission for overnight observation if indicated. Will consider administration of fresh frozen plasma based on thrombocytopenia levels and coagulopathy prior to dental procedures in the operating room. Patient will then follow up with the dentist of his choice for fabrication of upper lower complete dentures after adequate healing. Patient is aware of the potential risk for bleeding, bruising, swelling, infection, nerve damage, soft tissue damage, root tip fracture, mandible fracture, and sinus involvement. Patient is aware of the need for potential transfusion in a pre-operative, peri- or postoperative setting. He is also aware the potential risk for osteonecrosis of the jaw related to previous oral Fosamax therapy although patient has taken Fosamax for less than three years.  In the meantime I will order chlorhexidine rinses to use twice daily to aid in this infection of the oral cavity. I also will order lidocaine viscous to be used in a swish and spit manner to aid in numbing of the oral ulceration within the mouth. Patient is aware the potential risk for aspiration of food with the numbing medicine and is to avoid around meals.   2. Discussion of findings with medical team and coordination of future medical and dental care as needed.     Lenn Cal, DDS

## 2014-03-18 NOTE — H&P (Signed)
[facility] PROGRESS NOTE  Assessment/Plan: AKI (acute kidney injury) - I will continue to hold diuretics for today continue to monitor his electrolytes. - Restrict his fluid ingestion. - Continue to monitor creatinine.  Ascites - Abdominal distension is increased today. - Check Abd U/s, if Ascities fluid res accumulated will re-consult IR for paracentesis.  Normocytic  Anemia: - His hemoglobin is 7.4.  Hyponatremia: - Likely due to the failure, resume aldactone.  Thrombocytopenia - Improving, no signs of petechiae or bleeding.  Abscess of hand - Status post I&D has remained afebrile. - Continue IV vancomycin.  Anasarca - Apply TED hose.  Hyperkalemia: - Resolved. - Multifactorial due to acute kidney injury, Bactrim, Aldactone indicated.   Poor oral hygiene: - Awaiting for dentist recommendations.  Code Status: Full Family Communication: daughter and wife  Disposition Plan: inpatient   Consultants:  None  Procedures:  Interventional radiologist  Antibiotics:  Vancomycin 3.2.2016  HPI/Subjective: He relates his abdomen is worsening.  Objective: Filed Vitals:   03/17/14 2105 03/17/14 2122 03/18/14 0405 03/18/14 0804  BP: 110/53 111/49 118/62 115/57  Pulse: 102 102 101 95  Temp: 98.2 F (36.8 C) 98.2 F (36.8 C) 98.2 F (36.8 C) 98.4 F (36.9 C)  TempSrc: Oral Oral Oral Oral  Resp: 16 18 18 18   Height:      Weight:   90.6 kg (199 lb 11.8 oz)   SpO2: 96% 97% 93% 95%    Intake/Output Summary (Last 24 hours) at 03/18/14 1206 Last data filed at 03/18/14 0948  Gross per 24 hour  Intake    895 ml  Output    700 ml  Net    195 ml   Filed Weights   03/16/14 1409 03/17/14 0705 03/18/14 0405  Weight: 91.173 kg (201 lb) 91.7 kg (202 lb 2.6 oz) 90.6 kg (199 lb 11.8 oz)    Exam:  General: Alert, awake, oriented x3, in no acute distress.  HEENT: No bruits, no goiter.  Heart: Regular rate and rhythm. Lungs: Good air movement,  clear Abdomen: Soft, nontender, nondistended, positive bowel sounds.  Neuro: Grossly intact, nonfocal.   Data Reviewed: Basic Metabolic Panel:  Recent Labs Lab 03/16/14 0956 03/17/14 0540 03/18/14 0442  NA 133* 132* 131*  K 5.2* 4.2 3.8  CL 103 102 104  CO2 20 21 22   GLUCOSE 92 149* 117*  BUN 39* 37* 23  CREATININE 2.81* 2.33* 1.43*  CALCIUM 7.6* 7.5* 7.5*   Liver Function Tests:  Recent Labs Lab 03/16/14 0956 03/17/14 0540  AST 47* 43*  ALT 31 31  ALKPHOS 126* 110  BILITOT 2.8* 2.3*  PROT 5.3* 5.1*  ALBUMIN 2.1* 1.9*    Recent Labs Lab 03/16/14 0956  LIPASE 35    Recent Labs Lab 03/16/14 1045  AMMONIA 135*   CBC:  Recent Labs Lab 03/16/14 0956 03/17/14 0540 03/18/14 0442  WBC 5.7 7.0 5.5  NEUTROABS 3.6  --   --   HGB 8.0* 7.1* 7.4*  HCT 23.2* 20.9* 22.0*  MCV 91.3 92.1 93.2  PLT 76* 85* 66*   Cardiac Enzymes: No results for input(s): CKTOTAL, CKMB, CKMBINDEX, TROPONINI in the last 168 hours. BNP (last 3 results) No results for input(s): BNP in the last 8760 hours.  ProBNP (last 3 results)  Recent Labs  11/15/13 1237  PROBNP 864.3*    CBG: No results for input(s): GLUCAP in the last 168 hours.  No results found for this or any previous visit (from the past 240 hour(s)).  Studies: US Paracentesis  03/17/2014   INDICATION: Ascites; cirrhosis  EXAM: ULTRASOUND-GUIDED PARACENTESIS  COMPARISON:  Previous paracentesis  MEDICATIONS: 10 cc 1% lidocaine  COMPLICATIONS: None immediate  TECHNIQUE: Informed written consent was obtained from the patient after a discussion of the risks, benefits and alternatives to treatment. A timeout was performed prior to the initiation of the procedure.  Initial ultrasound scanning demonstrates a large amount of ascites within the left lower abdominal quadrant. The left lower abdomen was prepped and draped in the usual sterile fashion. 1% lidocaine with epinephrine was used for local anesthesia. Under direct  ultrasound guidance, a 19 gauge, 7-cm, Yueh catheter was introduced. An ultrasound image was saved for documentation purposes. The paracentesis was performed. The catheter was removed and a dressing was applied. The patient tolerated the procedure well without immediate post procedural complication.  FINDINGS: A total of approximately 4.9 liters of light yellow fluid was removed.  IMPRESSION: Successful ultrasound-guided paracentesis yielding 4.9 liters of peritoneal fluid.  Read by:  Lavonia Drafts [facility]   Electronically Signed   By: Corrie Mckusick D.O.   On: 03/17/2014 12:05    Scheduled Meds: . doxycycline (VIBRAMYCIN) IV  100 mg Intravenous Q12H  . feeding supplement (RESOURCE BREEZE)  1 Container Oral TID BM  . iron polysaccharides  150 mg Oral Q breakfast  . magnesium gluconate  250 mg Oral Daily  . pantoprazole  40 mg Oral Daily  . rifaximin  550 mg Oral BID  . sodium chloride  3 mL Intravenous Q12H  . sodium chloride  3 mL Intravenous Q12H   Continuous Infusions:    Clifford Cook  [facility] Pager [phone]. If 7PM-7AM, please contact night-coverage at [email or web address], password [facility] 03/18/2014, 12:06 PM  LOS: 2 days

## 2014-03-19 LAB — BASIC METABOLIC PANEL
Anion gap: 5 (ref 5–15)
BUN: 14 mg/dL (ref 6–23)
CHLORIDE: 102 mmol/L (ref 96–112)
CO2: 22 mmol/L (ref 19–32)
Calcium: 7.6 mg/dL — ABNORMAL LOW (ref 8.4–10.5)
Creatinine, Ser: 1 mg/dL (ref 0.50–1.35)
GFR calc Af Amer: 87 mL/min — ABNORMAL LOW (ref >90)
GFR, EST NON AFRICAN AMERICAN: 75 mL/min — AB (ref >90)
Glucose, Bld: 139 mg/dL — ABNORMAL HIGH (ref 70–99)
Potassium: 3.9 mmol/L (ref 3.5–5.1)
Sodium: 129 mmol/L — ABNORMAL LOW (ref 135–145)

## 2014-03-19 MED ORDER — SPIRONOLACTONE 25 MG PO TABS
100.0000 mg | ORAL_TABLET | Freq: Every day | ORAL | Status: DC
  Administered 2014-03-19 – 2014-03-21 (×3): 100 mg via ORAL
  Filled 2014-03-19 (×3): qty 4

## 2014-03-19 MED ORDER — FUROSEMIDE 40 MG PO TABS
40.0000 mg | ORAL_TABLET | Freq: Two times a day (BID) | ORAL | Status: DC
  Administered 2014-03-19 – 2014-03-20 (×2): 40 mg via ORAL
  Filled 2014-03-19 (×2): qty 1

## 2014-03-19 MED ORDER — DOXYCYCLINE HYCLATE 100 MG PO TABS
100.0000 mg | ORAL_TABLET | Freq: Two times a day (BID) | ORAL | Status: DC
  Administered 2014-03-19 – 2014-03-21 (×5): 100 mg via ORAL
  Filled 2014-03-19 (×5): qty 1

## 2014-03-19 NOTE — Progress Notes (Signed)
[facility] PROGRESS NOTE Interim History: AKI (acute kidney injury) - Cr at baseline, resume aldactone. - Restrict his fluid ingestion. - Continue to monitor creatinine.  Ascites - Abdominal distension is increased today. - S/p x2 paracentesis 3.2.2016 4.9L, 3.4.2016 3.8 L. - feels better.  Normocytic Anemia: - Stable hemoglobin.  Hyponatremia: - Likely due to the failure, increase aldactone.  Thrombocytopenia - Improving, no signs of petechiae or bleeding.  Abscess of hand - Status post I&D has remained afebrile. - de-escalate antibiotics to doxycycline.  Anasarca - Apply TED hose.   Code Status: Full Family Communication: daughter and wife  Disposition Plan: inpatient   Consultants:  none  Procedures:  U/s guided paracentesis 3.2.2016 and 3.4.2016  Antibiotics:  vanc de-escalated to doxy  HPI/Subjective: No compalins  Objective: Filed Vitals:   03/18/14 2129 03/18/14 2136 03/18/14 2230 03/19/14 0655  BP: 96/46 98/49 103/54 92/46  Pulse: 87 89 92 82  Temp: 98 F (36.7 C) 98.4 F (36.9 C) 98 F (36.7 C) 98.1 F (36.7 C)  TempSrc: Oral Axillary Axillary Oral  Resp: 16 18 18 18   Height:      Weight:    86.818 kg (191 lb 6.4 oz)  SpO2: 96% 97% 100% 95%    Intake/Output Summary (Last 24 hours) at 03/19/14 1031 Last data filed at 03/19/14 0452  Gross per 24 hour  Intake    600 ml  Output   1100 ml  Net   -500 ml   Filed Weights   03/17/14 0705 03/18/14 0405 03/19/14 0655  Weight: 91.7 kg (202 lb 2.6 oz) 90.6 kg (199 lb 11.8 oz) 86.818 kg (191 lb 6.4 oz)    Exam:  General: Alert, awake, oriented x3, in no acute distress.  HEENT: No bruits, no goiter.  Heart: Regular rate and rhythm. Lungs: Good air movement, clear Abdomen: Soft, less distended. Neuro: Grossly intact, nonfocal.   Data Reviewed: Basic Metabolic Panel:  Recent Labs Lab 03/16/14 0956 03/17/14 0540 03/18/14 0442 03/19/14 0428  NA 133* 132* 131* 129*    K 5.2* 4.2 3.8 3.9  CL 103 102 104 102  CO2 20 21 22 22   GLUCOSE 92 149* 117* 139*  BUN 39* 37* 23 14  CREATININE 2.81* 2.33* 1.43* 1.00  CALCIUM 7.6* 7.5* 7.5* 7.6*   Liver Function Tests:  Recent Labs Lab 03/16/14 0956 03/17/14 0540  AST 47* 43*  ALT 31 31  ALKPHOS 126* 110  BILITOT 2.8* 2.3*  PROT 5.3* 5.1*  ALBUMIN 2.1* 1.9*    Recent Labs Lab 03/16/14 0956  LIPASE 35    Recent Labs Lab 03/16/14 1045  AMMONIA 135*   CBC:  Recent Labs Lab 03/16/14 0956 03/17/14 0540 03/18/14 0442  WBC 5.7 7.0 5.5  NEUTROABS 3.6  --   --   HGB 8.0* 7.1* 7.4*  HCT 23.2* 20.9* 22.0*  MCV 91.3 92.1 93.2  PLT 76* 85* 66*   Cardiac Enzymes: No results for input(s): CKTOTAL, CKMB, CKMBINDEX, TROPONINI in the last 168 hours. BNP (last 3 results) No results for input(s): BNP in the last 8760 hours.  ProBNP (last 3 results)  Recent Labs  11/15/13 1237  PROBNP 864.3*    CBG: No results for input(s): GLUCAP in the last 168 hours.  No results found for this or any previous visit (from the past 240 hour(s)).   Studies: US Abdomen Complete  03/18/2014   INDICATION: Cirrhosis, recurrent ascites. Request is made for therapeutic paracentesis up to 5 liters.  EXAM:  ULTRASOUND-GUIDED THERAPEUTIC PARACENTESIS  COMPARISON:  Prior paracentesis on 03/17/2014  MEDICATIONS: None.  COMPLICATIONS: None immediate  TECHNIQUE: Informed written consent was obtained from the patient after a discussion of the risks, benefits and alternatives to treatment. A timeout was performed prior to the initiation of the procedure.  Initial ultrasound scanning demonstrates a moderate amount of ascites within the left lower abdominal quadrant. The left lower abdomen was prepped and draped in the usual sterile fashion. 1% lidocaine was used for local anesthesia. Under direct ultrasound guidance, a 19 gauge, 10-cm, Yueh catheter was introduced. An ultrasound image was saved for documentation purposed. The  paracentesis was performed. The catheter was removed and a dressing was applied. The patient tolerated the procedure well without immediate post procedural complication.  FINDINGS: A total of approximately 3.6 liters of yellow fluid was removed.  IMPRESSION: Successful ultrasound-guided therapeutic paracentesis yielding 3.6 liters of peritoneal fluid.  Read by: Rowe Robert, PA-C   Electronically Signed   By: Jacqulynn Cadet M.D.   On: 03/18/2014 16:41   US Paracentesis  03/17/2014   INDICATION: Ascites; cirrhosis  EXAM: ULTRASOUND-GUIDED PARACENTESIS  COMPARISON:  Previous paracentesis  MEDICATIONS: 10 cc 1% lidocaine  COMPLICATIONS: None immediate  TECHNIQUE: Informed written consent was obtained from the patient after a discussion of the risks, benefits and alternatives to treatment. A timeout was performed prior to the initiation of the procedure.  Initial ultrasound scanning demonstrates a large amount of ascites within the left lower abdominal quadrant. The left lower abdomen was prepped and draped in the usual sterile fashion. 1% lidocaine with epinephrine was used for local anesthesia. Under direct ultrasound guidance, a 19 gauge, 7-cm, Yueh catheter was introduced. An ultrasound image was saved for documentation purposes. The paracentesis was performed. The catheter was removed and a dressing was applied. The patient tolerated the procedure well without immediate post procedural complication.  FINDINGS: A total of approximately 4.9 liters of light yellow fluid was removed.  IMPRESSION: Successful ultrasound-guided paracentesis yielding 4.9 liters of peritoneal fluid.  Read by:  Lavonia Drafts [facility]   Electronically Signed   By: Corrie Mckusick D.O.   On: 03/17/2014 12:05    Scheduled Meds: . chlorhexidine  15 mL Mouth/Throat BID  . doxycycline (VIBRAMYCIN) IV  100 mg Intravenous Q12H  . feeding supplement (RESOURCE BREEZE)  1 Container Oral TID BM  . furosemide  40 mg Oral BID  . iron  polysaccharides  150 mg Oral Q breakfast  . lactulose  20 g Oral Daily  . magnesium gluconate  250 mg Oral Daily  . metoprolol succinate  12.5 mg Oral Daily  . pantoprazole  40 mg Oral Daily  . rifaximin  550 mg Oral BID  . spironolactone  100 mg Oral Daily  . Vitamin D (Ergocalciferol)  50,000 Units Oral Q7 days   Continuous Infusions:    Charlynne Cousins  [facility] Pager [phone]. If 7PM-7AM, please contact night-coverage at [email or web address], password [facility] 03/19/2014, 10:31 AM  LOS: 3 days

## 2014-03-20 LAB — BASIC METABOLIC PANEL
ANION GAP: 4 — AB (ref 5–15)
BUN: 10 mg/dL (ref 6–23)
CALCIUM: 7.7 mg/dL — AB (ref 8.4–10.5)
CO2: 24 mmol/L (ref 19–32)
Chloride: 103 mmol/L (ref 96–112)
Creatinine, Ser: 0.96 mg/dL (ref 0.50–1.35)
GFR calc Af Amer: >90 mL/min (ref >90)
GFR calc non Af Amer: 83 mL/min — ABNORMAL LOW (ref >90)
Glucose, Bld: 135 mg/dL — ABNORMAL HIGH (ref 70–99)
Potassium: 3.9 mmol/L (ref 3.5–5.1)
Sodium: 131 mmol/L — ABNORMAL LOW (ref 135–145)

## 2014-03-20 LAB — PROTIME-INR
INR: 1.78 — AB (ref 0.00–1.49)
Prothrombin Time: 20.9 seconds — ABNORMAL HIGH (ref 11.6–15.2)

## 2014-03-20 MED ORDER — FUROSEMIDE 40 MG PO TABS
80.0000 mg | ORAL_TABLET | Freq: Two times a day (BID) | ORAL | Status: DC
  Administered 2014-03-20 – 2014-03-21 (×2): 80 mg via ORAL
  Filled 2014-03-20 (×2): qty 2

## 2014-03-20 NOTE — Progress Notes (Signed)
[facility] PROGRESS NOTE Interim History: AKI (acute kidney injury) - Cr at baseline, resume aldactone. - resume lasix.  Ascites - Abdominal distension worse today. - S/p x2 paracentesis 3.2.2016 4.9L, 3.4.2016 3.8 L. - will d/w GI.  Normocytic Anemia: - Stable hemoglobin. - Cbc pending   Hyponatremia: - Likely due to the failure, increase aldactone. - resume lasix.  Thrombocytopenia - Improving, no signs of petechiae or bleeding.  Abscess of hand - Status post I&D has remained afebrile. - cont doxy  Anasarca - Apply TED hose.   Code Status: Full Family Communication: daughter and wife  Disposition Plan: inpatient   Consultants:  none  Procedures:  U/s guided paracentesis 3.2.2016 and 3.4.2016  Antibiotics:  vanc de-escalated to doxy  HPI/Subjective: No complains, abdominal distension.  Objective: Filed Vitals:   03/19/14 1318 03/19/14 2053 03/20/14 0500 03/20/14 0525  BP: 98/45 104/53  96/46  Pulse: 83 89  89  Temp: 98.1 F (36.7 C) 98.1 F (36.7 C)  97.8 F (36.6 C)  TempSrc: Oral Oral  Oral  Resp: 18 18  18   Height:      Weight:   86.818 kg (191 lb 6.4 oz)   SpO2: 94% 98%  96%    Intake/Output Summary (Last 24 hours) at 03/20/14 0922 Last data filed at 03/20/14 0843  Gross per 24 hour  Intake    240 ml  Output   2125 ml  Net  -1885 ml   Filed Weights   03/18/14 0405 03/19/14 0655 03/20/14 0500  Weight: 90.6 kg (199 lb 11.8 oz) 86.818 kg (191 lb 6.4 oz) 86.818 kg (191 lb 6.4 oz)    Exam:  General: Alert, awake, oriented x3, in no acute distress.  HEENT: No bruits, no goiter.  Heart: Regular rate and rhythm. Lungs: Good air movement, clear Abdomen: Soft, less distended. Neuro: Grossly intact, nonfocal.   Data Reviewed: Basic Metabolic Panel:  Recent Labs Lab 03/16/14 0956 03/17/14 0540 03/18/14 0442 03/19/14 0428  NA 133* 132* 131* 129*  K 5.2* 4.2 3.8 3.9  CL 103 102 104 102  CO2 20 21 22 22   GLUCOSE  92 149* 117* 139*  BUN 39* 37* 23 14  CREATININE 2.81* 2.33* 1.43* 1.00  CALCIUM 7.6* 7.5* 7.5* 7.6*   Liver Function Tests:  Recent Labs Lab 03/16/14 0956 03/17/14 0540  AST 47* 43*  ALT 31 31  ALKPHOS 126* 110  BILITOT 2.8* 2.3*  PROT 5.3* 5.1*  ALBUMIN 2.1* 1.9*    Recent Labs Lab 03/16/14 0956  LIPASE 35    Recent Labs Lab 03/16/14 1045  AMMONIA 135*   CBC:  Recent Labs Lab 03/16/14 0956 03/17/14 0540 03/18/14 0442  WBC 5.7 7.0 5.5  NEUTROABS 3.6  --   --   HGB 8.0* 7.1* 7.4*  HCT 23.2* 20.9* 22.0*  MCV 91.3 92.1 93.2  PLT 76* 85* 66*   Cardiac Enzymes: No results for input(s): CKTOTAL, CKMB, CKMBINDEX, TROPONINI in the last 168 hours. BNP (last 3 results) No results for input(s): BNP in the last 8760 hours.  ProBNP (last 3 results)  Recent Labs  11/15/13 1237  PROBNP 864.3*    CBG: No results for input(s): GLUCAP in the last 168 hours.  No results found for this or any previous visit (from the past 240 hour(s)).   Studies: US Abdomen Complete  03/18/2014   INDICATION: Cirrhosis, recurrent ascites. Request is made for therapeutic paracentesis up to 5 liters.  EXAM: ULTRASOUND-GUIDED THERAPEUTIC PARACENTESIS  COMPARISON:  Prior paracentesis on 03/17/2014  MEDICATIONS: None.  COMPLICATIONS: None immediate  TECHNIQUE: Informed written consent was obtained from the patient after a discussion of the risks, benefits and alternatives to treatment. A timeout was performed prior to the initiation of the procedure.  Initial ultrasound scanning demonstrates a moderate amount of ascites within the left lower abdominal quadrant. The left lower abdomen was prepped and draped in the usual sterile fashion. 1% lidocaine was used for local anesthesia. Under direct ultrasound guidance, a 19 gauge, 10-cm, Yueh catheter was introduced. An ultrasound image was saved for documentation purposed. The paracentesis was performed. The catheter was removed and a dressing was  applied. The patient tolerated the procedure well without immediate post procedural complication.  FINDINGS: A total of approximately 3.6 liters of yellow fluid was removed.  IMPRESSION: Successful ultrasound-guided therapeutic paracentesis yielding 3.6 liters of peritoneal fluid.  Read by: Rowe Robert, PA-C   Electronically Signed   By: Jacqulynn Cadet M.D.   On: 03/18/2014 16:41    Scheduled Meds: . chlorhexidine  15 mL Mouth/Throat BID  . doxycycline  100 mg Oral Q12H  . feeding supplement (RESOURCE BREEZE)  1 Container Oral TID BM  . furosemide  80 mg Oral BID  . iron polysaccharides  150 mg Oral Q breakfast  . lactulose  20 g Oral Daily  . magnesium gluconate  250 mg Oral Daily  . metoprolol succinate  12.5 mg Oral Daily  . pantoprazole  40 mg Oral Daily  . rifaximin  550 mg Oral BID  . spironolactone  100 mg Oral Daily  . Vitamin D (Ergocalciferol)  50,000 Units Oral Q7 days   Continuous Infusions:    Charlynne Cousins  [facility] Pager [phone]. If 7PM-7AM, please contact night-coverage at [email or web address], password [facility] 03/20/2014, 9:22 AM  LOS: 4 days

## 2014-03-21 LAB — CBC
HEMATOCRIT: 20.9 % — AB (ref 39.0–52.0)
HEMATOCRIT: 23.4 % — AB (ref 39.0–52.0)
HEMOGLOBIN: 7.1 g/dL — AB (ref 13.0–17.0)
Hemoglobin: 7.9 g/dL — ABNORMAL LOW (ref 13.0–17.0)
MCH: 32.3 pg (ref 26.0–34.0)
MCH: 31.3 pg (ref 26.0–34.0)
MCHC: 34 g/dL (ref 30.0–36.0)
MCHC: 33.8 g/dL (ref 30.0–36.0)
MCV: 95 fL (ref 78.0–100.0)
MCV: 92.9 fL (ref 78.0–100.0)
Platelets: 66 10*3/uL — ABNORMAL LOW (ref 150–400)
Platelets: 64 10*3/uL — ABNORMAL LOW (ref 150–400)
RBC: 2.2 MIL/uL — ABNORMAL LOW (ref 4.22–5.81)
RBC: 2.52 MIL/uL — ABNORMAL LOW (ref 4.22–5.81)
RDW: 18.3 % — AB (ref 11.5–15.5)
RDW: 18.4 % — AB (ref 11.5–15.5)
WBC: 5.4 10*3/uL (ref 4.0–10.5)
WBC: 5.6 10*3/uL (ref 4.0–10.5)

## 2014-03-21 LAB — BASIC METABOLIC PANEL
Anion gap: 6 (ref 5–15)
BUN: 10 mg/dL (ref 6–23)
CHLORIDE: 99 mmol/L (ref 96–112)
CO2: 26 mmol/L (ref 19–32)
Calcium: 7.5 mg/dL — ABNORMAL LOW (ref 8.4–10.5)
Creatinine, Ser: 0.99 mg/dL (ref 0.50–1.35)
GFR calc Af Amer: >90 mL/min (ref >90)
GFR calc non Af Amer: 82 mL/min — ABNORMAL LOW (ref >90)
Glucose, Bld: 103 mg/dL — ABNORMAL HIGH (ref 70–99)
Potassium: 3.3 mmol/L — ABNORMAL LOW (ref 3.5–5.1)
Sodium: 131 mmol/L — ABNORMAL LOW (ref 135–145)

## 2014-03-21 LAB — PREPARE RBC (CROSSMATCH)

## 2014-03-21 MED ORDER — DOXYCYCLINE HYCLATE 100 MG PO TABS: 100.0000 mg | ORAL_TABLET | Freq: Two times a day (BID) | ORAL | Status: DC

## 2014-03-21 MED ORDER — FUROSEMIDE 80 MG PO TABS: 80.0000 mg | ORAL_TABLET | Freq: Two times a day (BID) | ORAL | Status: DC

## 2014-03-21 MED ORDER — LIDOCAINE VISCOUS 2 % MT SOLN: 10.0000 mL | OROMUCOSAL | Status: DC | PRN

## 2014-03-21 MED ORDER — LACTULOSE 10 GM/15ML PO SOLN: 20.0000 g | Freq: Every day | ORAL | Status: AC

## 2014-03-21 MED ORDER — SODIUM CHLORIDE 0.9 % IV SOLN
Freq: Once | INTRAVENOUS | Status: AC
  Administered 2014-03-21: 13:00:00 via INTRAVENOUS

## 2014-03-21 MED ORDER — FUROSEMIDE 80 MG PO TABS: ORAL_TABLET | ORAL | Status: AC

## 2014-03-21 MED ORDER — RIFAXIMIN 550 MG PO TABS: 550.0000 mg | ORAL_TABLET | Freq: Two times a day (BID) | ORAL | Status: AC

## 2014-03-21 MED ORDER — OXYCODONE HCL 5 MG PO TABS: 5.0000 mg | ORAL_TABLET | Freq: Four times a day (QID) | ORAL | Status: AC | PRN

## 2014-03-21 NOTE — Progress Notes (Signed)
[facility] PROGRESS NOTE Subjective Renal function now ok, wt slowly coming up  Objective: Vital signs in last 24 hours: Temp:  [98.2 F (36.8 C)] 98.2 F (36.8 C) (03/07 0511) Pulse Rate:  [89] 89 (03/07 0511) Resp:  [18] 18 (03/07 0511) BP: (95-109)/(46-49) 95/46 mmHg (03/07 0511) SpO2:  [95 %-97 %] 95 % (03/07 0511) Weight:  [87.2 kg (192 lb 3.9 oz)] 87.2 kg (192 lb 3.9 oz) (03/07 0511) Last BM Date: 03/20/14  Intake/Output from previous day: 03/06 0701 - 03/07 0700 In: 720 [P.O.:720] Out: 1075 [Urine:1075] Intake/Output this shift: Total I/O In: -  Out: 320 [Urine:320]  PE: General--NAD Heart-- Lungs-- Abdomen--soft, some ascites  Lab Results:  Recent Labs  03/21/14 0520  WBC 5.4  HGB 7.1*  HCT 20.9*  PLT 66*   BMET  Recent Labs  03/19/14 0428 03/20/14 1005 03/21/14 0520  NA 129* 131* 131*  K 3.9 3.9 3.3*  CL 102 103 99  CO2 22 24 26   CREATININE 1.00 0.96 0.99   LFT No results for input(s): PROT, AST, ALT, ALKPHOS, BILITOT, BILIDIR, IBILI in the last 72 hours. PT/INR  Recent Labs  03/20/14 1005  LABPROT 20.9*  INR 1.78*   PANCREAS No results for input(s): LIPASE in the last 72 hours.       Studies/Results: No results found.  Medications: I have reviewed the patient's current medications.  Assessment/Plan: 1. Cirrhosis/ascites. Pt has not had much trouble until recently and has been controlled with diuretics and only 1 paracentesis until now. Would continue to try to manage with diuretics for now. Would send home on current spironolactone 100 mg and furosemide 80 mg QD and f/u with Dr Watt Climes in 1 week, daily weights and no salt added diet.   Kimble Delaurentis JR,Kolden Dupee L 03/21/2014, 9:39 AM

## 2014-03-21 NOTE — Discharge Summary (Signed)
Physician Discharge Summary  Clifford Cook JOA:[id number] DOB: 27-Nov-1945 DOA: 03/16/2014  PCP: [name], MD  Admit date: 03/16/2014 Discharge date: 03/21/2014  Time spent:35 minutes  Recommendations for Outpatient Follow-up:  1. Follow up with Dr. Watt Climes in 1 week. 2. Dr. Mahlon Gammon in 2 weeks. 3. [facility] to check a CBC and a basic metabolic panel on 60/10/9321. Please send the result to Dr. Jovita Kussmaul.  Discharge Diagnoses:  Principal Problem:   AKI (acute kidney injury) Active Problems:   Ascites   Hyponatremia   Anemia   Thrombocytopenia   Hyperkalemia   Abscess of hand   Anasarca   Discharge Condition: stable  Diet recommendation: low sodium  Filed Weights   03/19/14 0655 03/20/14 0500 03/21/14 0511  Weight: 86.818 kg (191 lb 6.4 oz) 86.818 kg (191 lb 6.4 oz) 87.2 kg (192 lb 3.9 oz)    History of present illness:  69 y.o. male past medical history of [name] and cirrhosis that comes in for abdominal pain and left upper extremity cellulitis He was recently seen by his PCP who increased his diuretic and start him on Keflex, hematemesis primary care doctor 2 days prior to admission as the Keflex was not helping and was making him nauseated. This she switch to Bactrim but he continued to get abdominal pain with fatigue which has progressively gotten worse. He relates his Lasix was increased and since then his swelling has not improved. He denies any fevers, cuts medications or sores.  Hospital Course:  AKI (acute kidney injury) - His urinary sodium on admission was low his diuretics were held. He was fluid restricted and low sodium diet. - He received 2 units of packed red blood cells his creatinine improved slowly. - He will be sent home on Lasix 80 mg twice a day plus spironolactone 100 he will have a basic metabolic panel check on Thursday. - follow up with Dr. Watt Climes  Ascites - Abdominal distension on admission - S/p x2 paracentesis 3.2.2016 4.9L, 3.4.2016  3.8 L. - GI recommended to continue Lasix and spironolactone they will follow-up in a week.  Normocytic Anemia: - Status post 2 units of packed red blood cells [facility] to check a CBC on 03/24/2014.  Hyponatremia: - Likely due to liver failure - Resume Lasix at higher dose and Dr. Daisy Floro 100 mg a day.  Thrombocytopenia - Stable continue to monitor as an outpatient.  Abscess of hand - Status post I&D has remained afebrile. - cont doxy  Anasarca - Apply TED hose.   Procedures:  Status post paracentesis by IR   Consultations:  Interventional radiology  Gastrenterology  Discharge Exam: Filed Vitals:   03/21/14 0511  BP: 95/46  Pulse: 89  Temp: 98.2 F (36.8 C)  Resp: 18    General: Alert and oriented 3 Cardiovascular: Regular rate and rhythm Respiratory: Air movement clear to auscultation.  Discharge Instructions   Discharge Instructions    Diet - low sodium heart healthy    Complete by:  As directed      Increase activity slowly    Complete by:  As directed           Current Discharge Medication List    START taking these medications   Details  doxycycline (VIBRA-TABS) 100 MG tablet Take 1 tablet (100 mg total) by mouth every 12 (twelve) hours. Qty: 20 tablet, Refills: 0    lactulose (CHRONULAC) 10 GM/15ML solution Take 30 mLs (20 g total) by mouth daily. Qty: 240  mL, Refills: 0    lidocaine (XYLOCAINE) 2 % solution Use as directed 10 mLs in the mouth or throat every 4 (four) hours as needed for mouth pain. Qty: 100 mL, Refills: 0      CONTINUE these medications which have CHANGED   Details  furosemide (LASIX) 80 MG tablet Take 1 tablet (80 mg total) by mouth 2 (two) times daily. Qty: 60 tablet, Refills: 0    oxyCODONE (OXY IR/ROXICODONE) 5 MG immediate release tablet Take 1 tablet (5 mg total) by mouth every 6 (six) hours as needed for moderate pain. Qty: 90 tablet, Refills: 0    rifaximin (XIFAXAN) 550 MG TABS tablet Take 1  tablet (550 mg total) by mouth 2 (two) times daily. Qty: 60 tablet, Refills: 0      CONTINUE these medications which have NOT CHANGED   Details  Magnesium 250 MG TABS Take 250 mg by mouth daily.    pantoprazole (PROTONIX) 40 MG tablet Take 1 tablet (40 mg total) by mouth daily.    Polysaccharide Iron Complex (POLY-IRON 150 PO) Take 1 capsule by mouth daily.    spironolactone (ALDACTONE) 100 MG tablet Take 100 mg by mouth daily.    Vitamin D, Ergocalciferol, (DRISDOL) 50000 UNITS CAPS capsule Take 50,000 Units by mouth every 7 (seven) days.    alendronate (FOSAMAX) 70 MG tablet Take 1 tablet (70 mg total) by mouth once a week. Hold until antibiotic therapy is completed    diphenhydrAMINE (BENADRYL) 25 mg capsule Take 1 capsule (25 mg total) by mouth every 12 (twelve) hours as needed for itching. Qty: 30 capsule, Refills: 0    docusate sodium (COLACE) 100 MG capsule Take 1 capsule (100 mg total) by mouth 2 (two) times daily. Qty: 60 capsule, Refills: 0    feeding supplement, RESOURCE BREEZE, (RESOURCE BREEZE) LIQD Take 1 Container by mouth 3 (three) times daily between meals. Refills: 0    ferrous sulfate 325 (65 FE) MG tablet Take 1 tablet (325 mg total) by mouth 2 (two) times daily with a meal. Refills: 3    temazepam (RESTORIL) 7.5 MG capsule Take 1 capsule (7.5 mg total) by mouth at bedtime as needed for sleep. Qty: 30 capsule, Refills: 0      STOP taking these medications     cefTRIAXone (ROCEPHIN) 40 MG/ML IVPB      HYDROcodone-acetaminophen (NORCO) 5-325 MG per tablet      metoprolol succinate (TOPROL-XL) 12.5 mg TB24 24 hr tablet      sorbitol 70 % SOLN        Allergies  Allergen Reactions  . Pulmicort [Budesonide] Shortness Of Breath    Causes stridor  . Penicillins Other (See Comments)    Rash hives, white spots.      The results of significant diagnostics from this hospitalization (including imaging, microbiology, ancillary and laboratory) are listed  below for reference.    Significant Diagnostic Studies: US Abdomen Complete  03/18/2014   INDICATION: Cirrhosis, recurrent ascites. Request is made for therapeutic paracentesis up to 5 liters.  EXAM: ULTRASOUND-GUIDED THERAPEUTIC PARACENTESIS  COMPARISON:  Prior paracentesis on 03/17/2014  MEDICATIONS: None.  COMPLICATIONS: None immediate  TECHNIQUE: Informed written consent was obtained from the patient after a discussion of the risks, benefits and alternatives to treatment. A timeout was performed prior to the initiation of the procedure.  Initial ultrasound scanning demonstrates a moderate amount of ascites within the left lower abdominal quadrant. The left lower abdomen was prepped and draped in the usual sterile  fashion. 1% lidocaine was used for local anesthesia. Under direct ultrasound guidance, a 19 gauge, 10-cm, Yueh catheter was introduced. An ultrasound image was saved for documentation purposed. The paracentesis was performed. The catheter was removed and a dressing was applied. The patient tolerated the procedure well without immediate post procedural complication.  FINDINGS: A total of approximately 3.6 liters of yellow fluid was removed.  IMPRESSION: Successful ultrasound-guided therapeutic paracentesis yielding 3.6 liters of peritoneal fluid.  Read by: Rowe Robert, PA-C   Electronically Signed   By: Jacqulynn Cadet M.D.   On: 03/18/2014 16:41   US Paracentesis  03/17/2014   INDICATION: Ascites; cirrhosis  EXAM: ULTRASOUND-GUIDED PARACENTESIS  COMPARISON:  Previous paracentesis  MEDICATIONS: 10 cc 1% lidocaine  COMPLICATIONS: None immediate  TECHNIQUE: Informed written consent was obtained from the patient after a discussion of the risks, benefits and alternatives to treatment. A timeout was performed prior to the initiation of the procedure.  Initial ultrasound scanning demonstrates a large amount of ascites within the left lower abdominal quadrant. The left lower abdomen was prepped and  draped in the usual sterile fashion. 1% lidocaine with epinephrine was used for local anesthesia. Under direct ultrasound guidance, a 19 gauge, 7-cm, Yueh catheter was introduced. An ultrasound image was saved for documentation purposes. The paracentesis was performed. The catheter was removed and a dressing was applied. The patient tolerated the procedure well without immediate post procedural complication.  FINDINGS: A total of approximately 4.9 liters of light yellow fluid was removed.  IMPRESSION: Successful ultrasound-guided paracentesis yielding 4.9 liters of peritoneal fluid.  Read by:  Lavonia Drafts [facility]   Electronically Signed   By: Corrie Mckusick D.O.   On: 03/17/2014 12:05    Microbiology: No results found for this or any previous visit (from the past 240 hour(s)).   Labs: Basic Metabolic Panel:  Recent Labs Lab 03/17/14 0540 03/18/14 0442 03/19/14 0428 03/20/14 1005 03/21/14 0520  NA 132* 131* 129* 131* 131*  K 4.2 3.8 3.9 3.9 3.3*  CL 102 104 102 103 99  CO2 21 22 22 24 26   GLUCOSE 149* 117* 139* 135* 103*  BUN 37* 23 14 10 10   CREATININE 2.33* 1.43* 1.00 0.96 0.99  CALCIUM 7.5* 7.5* 7.6* 7.7* 7.5*   Liver Function Tests:  Recent Labs Lab 03/16/14 0956 03/17/14 0540  AST 47* 43*  ALT 31 31  ALKPHOS 126* 110  BILITOT 2.8* 2.3*  PROT 5.3* 5.1*  ALBUMIN 2.1* 1.9*    Recent Labs Lab 03/16/14 0956  LIPASE 35    Recent Labs Lab 03/16/14 1045  AMMONIA 135*   CBC:  Recent Labs Lab 03/16/14 0956 03/17/14 0540 03/18/14 0442 03/21/14 0520  WBC 5.7 7.0 5.5 5.4  NEUTROABS 3.6  --   --   --   HGB 8.0* 7.1* 7.4* 7.1*  HCT 23.2* 20.9* 22.0* 20.9*  MCV 91.3 92.1 93.2 95.0  PLT 76* 85* 66* 66*   Cardiac Enzymes: No results for input(s): CKTOTAL, CKMB, CKMBINDEX, TROPONINI in the last 168 hours. BNP: BNP (last 3 results) No results for input(s): BNP in the last 8760 hours.  ProBNP (last 3 results)  Recent Labs  11/15/13 1237  PROBNP 864.3*     CBG: No results for input(s): GLUCAP in the last 168 hours.     Signed:  Charlynne Cousins  [facility] 03/21/2014, 10:41 AM

## 2014-03-21 NOTE — Care Management Note (Signed)
    Page 1 of 1   03/21/2014     12:03:19 PM CARE MANAGEMENT NOTE 03/21/2014  Patient:  Bishara,Jaxson A   Account Number:  [id number]  Date Initiated:  03/17/2014  Documentation initiated by:  McGIBBONEY,COOKIE MYRAETTE  Subjective/Objective Assessment:   pt admitted with cco abdominal pain and left upper extremity cellulitis     Action/Plan:   from home   Anticipated DC Date:  03/21/2014   Anticipated DC Plan:  HOME W HOME HEALTH SERVICES      DC Planning Services  CM consult      PAC Choice  HOME HEALTH   Choice offered to / List presented to:  C-3 Spouse        HH arranged  HH-1 RN  HH-10 DISEASE MANAGEMENT      HH agency  [facility]   Status of service:  Completed, signed off Medicare Important Message given?  YES (If response is "NO", the following Medicare IM given date fields will be blank) Date Medicare IM given:  03/21/2014 Medicare IM given by:  , Date Additional Medicare IM given:   Additional Medicare IM given by:    Discharge Disposition:  HOME W HOME HEALTH SERVICES  Per UR Regulation:  Reviewed for med. necessity/level of care/duration of stay  If discussed at Long Length of Stay Meetings, dates discussed:    Comments:  03-21-14   RNCM 1200 Discussed d/c with family, will need lab drawn, HHRN to draw labs. Per spouse have used [facility] in the past. Contacted [facility] to arrange b-met and CBC on 3.10.2016. Plan for d/c after blood transfusion today.  03/17/14 MMcGibboney, RN, BSN Chart reviewed.   

## 2014-03-22 LAB — TYPE AND SCREEN
ABO/RH(D): O NEG
Antibody Screen: NEGATIVE
UNIT DIVISION: 0

## 2014-03-29 ENCOUNTER — Other Ambulatory Visit (HOSPITAL_COMMUNITY)
Admission: RE | Admit: 2014-03-29 | Discharge: 2014-03-29 | Disposition: A | Discharge status: Home / Self Care | Payer: Medicare Other | Source: Ambulatory Visit | Attending: Gastroenterology | Admitting: Gastroenterology

## 2014-03-29 DIAGNOSIS — K7460 Unspecified cirrhosis of liver: Secondary | ICD-10-CM | POA: Diagnosis present

## 2014-03-29 LAB — NA AND K (SODIUM & POTASSIUM), RAND UR
Potassium Urine: 26 mmol/L
SODIUM UR: 93 mmol/L

## 2014-04-06 ENCOUNTER — Encounter (INDEPENDENT_AMBULATORY_CARE_PROVIDER_SITE_OTHER): Payer: Self-pay

## 2014-04-06 ENCOUNTER — Encounter (HOSPITAL_COMMUNITY): Payer: Self-pay | Admitting: Dentistry

## 2014-04-06 ENCOUNTER — Other Ambulatory Visit (HOSPITAL_COMMUNITY): Payer: Self-pay | Admitting: Dentistry

## 2014-04-06 ENCOUNTER — Ambulatory Visit (HOSPITAL_COMMUNITY): Payer: Self-pay | Admitting: Dentistry

## 2014-04-06 VITALS — BP 101/50 | HR 88 | Temp 98.0°F

## 2014-04-06 DIAGNOSIS — B955 Unspecified streptococcus as the cause of diseases classified elsewhere: Secondary | ICD-10-CM

## 2014-04-06 DIAGNOSIS — K083 Retained dental root: Secondary | ICD-10-CM

## 2014-04-06 DIAGNOSIS — D696 Thrombocytopenia, unspecified: Secondary | ICD-10-CM

## 2014-04-06 DIAGNOSIS — R7881 Bacteremia: Principal | ICD-10-CM

## 2014-04-06 DIAGNOSIS — K0530 Chronic periodontitis, unspecified: Secondary | ICD-10-CM

## 2014-04-06 DIAGNOSIS — K036 Deposits [accretions] on teeth: Secondary | ICD-10-CM

## 2014-04-06 DIAGNOSIS — K0401 Reversible pulpitis: Secondary | ICD-10-CM

## 2014-04-06 DIAGNOSIS — K029 Dental caries, unspecified: Secondary | ICD-10-CM

## 2014-04-06 DIAGNOSIS — K045 Chronic apical periodontitis: Secondary | ICD-10-CM

## 2014-04-06 DIAGNOSIS — M264 Malocclusion, unspecified: Secondary | ICD-10-CM

## 2014-04-06 DIAGNOSIS — K0889 Other specified disorders of teeth and supporting structures: Secondary | ICD-10-CM

## 2014-04-06 DIAGNOSIS — Z9189 Other specified personal risk factors, not elsewhere classified: Secondary | ICD-10-CM

## 2014-04-06 DIAGNOSIS — K08409 Partial loss of teeth, unspecified cause, unspecified class: Secondary | ICD-10-CM

## 2014-04-06 DIAGNOSIS — IMO0002 Reserved for concepts with insufficient information to code with codable children: Secondary | ICD-10-CM

## 2014-04-06 NOTE — Patient Instructions (Addendum)
Clifford Cook  04/06/2014   Your procedure is scheduled on: 04/14/2014    Report to [facility] Main  Entrance and follow signs to               [place] at       [place].  Call this number if you have problems the morning of surgery [phone]   Remember:  Do not eat food or drink liquids :After Midnight.     Take these medicines the morning of surgery with A SIP OF WATER:  Protonix, Oxycodone if needed                                You may not have any metal on your body including hair pins and              piercings  Do not wear jewelry,, lotions, powders or perfumes., deodorant.                             Men may shave face and neck.   Do not bring valuables to the hospital. [place].  Contacts, dentures or bridgework may not be worn into surgery.      Patients discharged the day of surgery will not be allowed to drive home.  Name and phone number of your driver: coughing and deep breathing exercises, leg exercises   Special Instructions:             Please read over the following fact sheets you were given: _____________________________________________________________________             [facility] - Preparing for Surgery Before surgery, you can play an important role.  Because skin is not sterile, your skin needs to be as free of germs as possible.  You can reduce the number of germs on your skin by washing with CHG (chlorahexidine gluconate) soap before surgery.  CHG is an antiseptic cleaner which kills germs and bonds with the skin to continue killing germs even after washing. Please DO NOT use if you have an allergy to CHG or antibacterial soaps.  If your skin becomes reddened/irritated stop using the CHG and inform your nurse when you arrive at Short Stay. Do not shave (including legs and underarms) for at least 48 hours prior to the first CHG shower.  You may shave your  face/neck. Please follow these instructions carefully:  1.  Shower with CHG Soap the night before surgery and the  morning of Surgery.  2.  If you choose to wash your hair, wash your hair first as usual with your  normal  shampoo.  3.  After you shampoo, rinse your hair and body thoroughly to remove the  shampoo.                           4.  Use CHG as you would any other liquid soap.  You can apply chg directly  to the skin and wash                       Gently with a scrungie or clean washcloth.  5.  Apply the CHG Soap to your body ONLY FROM THE NECK DOWN.   Do not use on face/ open                           Wound or open sores. Avoid contact with eyes, ears mouth and genitals (private parts).                       Wash face,  Genitals (private parts) with your normal soap.             6.  Wash thoroughly, paying special attention to the area where your surgery  will be performed.  7.  Thoroughly rinse your body with warm water from the neck down.  8.  DO NOT shower/wash with your normal soap after using and rinsing off  the CHG Soap.                9.  Pat yourself dry with a clean towel.            10.  Wear clean pajamas.            11.  Place clean sheets on your bed the night of your first shower and do not  sleep with pets. Day of Surgery : Do not apply any lotions/deodorants the morning of surgery.  Please wear clean clothes to the hospital/surgery center.  FAILURE TO FOLLOW THESE INSTRUCTIONS MAY RESULT IN THE CANCELLATION OF YOUR SURGERY PATIENT SIGNATURE_________________________________  NURSE SIGNATURE__________________________________  ________________________________________________________________________

## 2014-04-06 NOTE — Progress Notes (Signed)
04/06/2014  Patient Name:   Clifford Cook Date of Birth:   April 06, 1945 Medical Record Number: [id number]  BP 101/50 mmHg  Pulse 88  Temp(Src) 98 F (36.7 C) (Oral)  Clifford Cook is a 69 year old male that presents for periodic oral exam and radiographs. Patient was originally evaluated as an inpatient at [facility] rule out dental infection as a source of streptococcal bacteremia. Orthopantogram was unable to be taken as an inpatient. Patient was then scheduled for outpatient evaluation, radiographs, and discussion of treatment options.  Patient currently denies acute pulpitis symptoms. Patient was previously seen by his primary dentist a few years ago. Patient had been seeing the dentist on every 6 month basis for many years until his medical problems prevented him from getting routine dental care. Patient previously saw Dr. Mirna Mires.   Patient Active Problem List   Diagnosis Date Noted  . Abscess of hand   . Anasarca   . AKI (acute kidney injury) 03/16/2014  . Hyperkalemia 03/16/2014  . Atrial flutter 12/08/2013  . Bacteremia due to Streptococcus   . Encephalopathy, hepatic   . Hip fracture 12/05/2013  . SIRS (systemic inflammatory response syndrome) 12/05/2013  . Lactic acidosis 12/05/2013  . Acute confusion 11/25/2013  . Coagulopathy 11/12/2013  . General weakness 11/12/2013  . Bleeding from PICC line 11/11/2013  . Hypokalemia 11/11/2013  . Sepsis 10/31/2013  . Liver cirrhosis secondary to NASH 10/31/2013  . Pneumonia due to streptococcus, group A 10/31/2013  . Ascites 10/31/2013  . Elevated troponin 10/31/2013  . Generalized weakness 10/31/2013  . Hyponatremia 10/31/2013  . Anemia 10/31/2013  . Thrombocytopenia 10/31/2013  . Respiratory failure with hypoxia 10/31/2013  . Rib fracture 10/31/2013  . H/O: GI bleed 04/16/2012  . Tobacco abuse 04/16/2012    Medical Hx Update:  Past Medical History  Diagnosis Date  . Stomach ulcer   . GI bleed   . Liver  disorder   . Cirrhosis   . GERD (gastroesophageal reflux disease)   .  Past Surgical History  Procedure Laterality Date  . Cataract extraction, bilateral    . Surgery for lazy eye      age 5  . Esophagogastroduodenoscopy N/A 04/16/2012    Procedure: ESOPHAGOGASTRODUODENOSCOPY (EGD);  Surgeon: Jeryl Columbia, MD;  Location: [facility];  Service: Endoscopy;  Laterality: N/A;  . Esophagogastroduodenoscopy N/A 04/17/2012    Procedure: ESOPHAGOGASTRODUODENOSCOPY (EGD);  Surgeon: Jeryl Columbia, MD;  Location: [facility];  Service: Endoscopy;  Laterality: N/A;  . Direct laryngoscopy N/A 07/13/2013    Procedure: DIRECT LARYNGOSCOPY WITH EXCISION TUMOR;  Surgeon: Rozetta Nunnery, MD;  Location: [place];  Service: ENT;  Laterality: N/A;  . Hip pinning,cannulated Left 12/06/2013    Procedure: CANNULATED HIP PINNING;  Surgeon: Renette Butters, MD;  Location: [place];  Service: Orthopedics;  Laterality: Left;  . Tee without cardioversion N/A 12/13/2013    Procedure: TRANSESOPHAGEAL ECHOCARDIOGRAM (TEE);  Surgeon: Larey [facility]er, MD;  Location: [place];  Service: Cardiovascular;  Laterality: N/A;    ALLERGIES/ADVERSE DRUG REACTIONS: Allergies  Allergen Reactions  . Pulmicort [Budesonide] Shortness Of Breath    Causes stridor  . Penicillins Other (See Comments)    Rash hives, white spots.    MEDICATIONS: Current Outpatient Prescriptions  Medication Sig Dispense Refill  . alendronate (FOSAMAX) 70 MG tablet Take 1 tablet (70 mg total) by mouth once a week. Hold until antibiotic therapy is completed    . diphenhydrAMINE (BENADRYL) 25  mg capsule Take 1 capsule (25 mg total) by mouth every 12 (twelve) hours as needed for itching. 30 capsule 0  . docusate sodium (COLACE) 100 MG capsule Take 1 capsule (100 mg total) by mouth 2 (two) times daily. (Patient not taking: Reported on 01/31/2014) 60 capsule 0  . doxycycline (VIBRA-TABS) 100 MG tablet Take 1 tablet (100 mg total) by  mouth every 12 (twelve) hours. 20 tablet 0  . feeding supplement, RESOURCE BREEZE, (RESOURCE BREEZE) LIQD Take 1 Container by mouth 3 (three) times daily between meals. (Patient not taking: Reported on 12/05/2013)  0  . ferrous sulfate 325 (65 FE) MG tablet Take 1 tablet (325 mg total) by mouth 2 (two) times daily with a meal. (Patient not taking: Reported on 03/16/2014)  3  . furosemide (LASIX) 80 MG tablet Take one tablet in the am and 1/2 tablet in the PM 60 tablet 0  . lactulose (CHRONULAC) 10 GM/15ML solution Take 30 mLs (20 g total) by mouth daily. 240 mL 0  . lidocaine (XYLOCAINE) 2 % solution Use as directed 10 mLs in the mouth or throat every 4 (four) hours as needed for mouth pain. 100 mL 0  . Magnesium 250 MG TABS Take 250 mg by mouth daily.    Marland Kitchen oxyCODONE (OXY IR/ROXICODONE) 5 MG immediate release tablet Take 1 tablet (5 mg total) by mouth every 6 (six) hours as needed for moderate pain. 90 tablet 0  . pantoprazole (PROTONIX) 40 MG tablet Take 1 tablet (40 mg total) by mouth daily. (Patient taking differently: Take 40 mg by mouth 2 (two) times daily. )    . Polysaccharide Iron Complex (POLY-IRON 150 PO) Take 1 capsule by mouth daily.    . rifaximin (XIFAXAN) 550 MG TABS tablet Take 1 tablet (550 mg total) by mouth 2 (two) times daily. 60 tablet 0  . spironolactone (ALDACTONE) 100 MG tablet Take 100 mg by mouth daily.    . temazepam (RESTORIL) 7.5 MG capsule Take 1 capsule (7.5 mg total) by mouth at bedtime as needed for sleep. (Patient not taking: Reported on 03/16/2014) 30 capsule 0  . Vitamin D, Ergocalciferol, (DRISDOL) 50000 UNITS CAPS capsule Take 50,000 Units by mouth every 7 (seven) days.     No current facility-administered medications for this visit.   Lab Results  Component Value Date   WBC 5.6 03/21/2014   HGB 7.9* 03/21/2014   HCT 23.4* 03/21/2014   MCV 92.9 03/21/2014   PLT 64* 03/21/2014    BMET    Component Value Date/Time   NA 131* 03/21/2014 0520   K 3.3*  03/21/2014 0520   CL 99 03/21/2014 0520   CO2 26 03/21/2014 0520   GLUCOSE 103* 03/21/2014 0520   BUN 10 03/21/2014 0520   CREATININE 0.99 03/21/2014 0520   CALCIUM 7.5* 03/21/2014 0520   GFRNONAA 82* 03/21/2014 0520   GFRAA >90 03/21/2014 0520   HPI: Clifford Cook is a 69 year old male that presents for periodic oral exam and radiographs. Patient was originally evaluated as an inpatient at [facility] rule out dental infection as a source of streptococcal bacteremia. Orthopantogram was unable to be taken as an inpatient. Patient was then scheduled for outpatient evaluation, radiographs, and discussion of treatment options.  Patient currently denies acute pulpitis symptoms. Patient was previously seen by his primary dentist a few years ago. Patient had been seeing the dentist on every 6 month basis for many years until his medical problems prevented him from getting  routine dental care. Patient previously saw Dr. Mirna Mires.  DENTAL EXAM: General: Patient is a well-developed male in no acute distress. Vitals: BP 101/50 mmHg  Pulse 88  Temp(Src) 98 F (36.7 C) (Oral) Extraoral Exam: There is no palpable submandibular lymphadenopathy. The patient denies acute TMJ symptoms. Intraoral  Exam: The patient has severe xerostomia. The patient has a dry, fissured tongue. Patient would generalized stomatitis and erythema. Dentition: The patient is missing tooth numbers 1, 2, 12, 13, 14, 16, 17, 18, 19, 20, 29, 30, 31, 32. Patient has retained roots in the area tooth numbers 4, 21, 22, 23, 27, and 28. Periodontal: Patient has chronic, advanced periodontal disease with plaque and calculus accumulations, generalized gingival recession, and generalized tooth mobility as charted. Caries:  Patient has rampant dental caries. Endodontic:  Patient with a history of acute pulpitis symptoms. Patient with evidence of periapical pathology and radiolucency. Patient has had previous root canal  therapies. C&B: Patient has several crown restorations that have recurrent caries. Prosthodontic: Patient is no longer wearing partial dentures.  Occlusion:  Patient has a poor occlusal scheme secondary to multiple missing teeth, multiple retained root segments, and lack of replacement of missing teeth with dental prostheses. Radiographic Interpretation: An orthopantogram was taken and supplemented with 10 periapical radiographs. There are multiple missing teeth. There are multiple retained root segments. There are multiple areas of periapical pathology and radiolucency. Rampant dental caries are noted. There is moderate bone loss noted. There is supra-eruption and drifting of the unopposed teeth into the edentulous areas. There is pneumatization of the maxillary sinuses.  Assessments: 1. History of streptococcal bacteremia 2. Thrombocytopenia 3. Risk for bleeding with invasive dental procedures 4. History of acute pulpitis 5. Chronic apical periodontitis 6. Multiple retained root segments 7. Rampant dental caries 8. Chronic periodontitis with bone loss 9. Accretions 10. Gingival recession 11. Tooth mobility 12. Supra-eruption and drifting of the unopposed teeth into the edentulous areas 13. Malocclusion 14. Risk for osteonecrosis of the jaw related to previous oral bisphosphonate therapy. 15. Xerostomia   16. Stomatitis and dry fissured tongue.  Plan:  1.  I discussed the risks, benefits, complications of various treatment options with the patient in relationship to his medical and dental conditions, history of bacteremia, thrombocytopenia, and risk for bleeding with invasive dental procedures.  We discussed various treatment options to include no treatment, total and subtotal extractions with alveoloplasty, pre-prosthetic surgery as indicated, periodontal therapy, dental restorations, root canal therapy, crown and bridge therapy, implant therapy, and replacement of missing teeth as  indicated. The patient and family currently wish to proceed with extraction of all remaining teeth with alveoloplasty and pre-prosthetic surgery as indicated in the operating room with general anesthesia. Patient is aware that he may require blood products prior to or after dental procedures in the operating room. Patient will then follow-up with the general dentist of his choice for fabrication of upper and lower complete dentures after adequate healing. Patient will need to be kept overnight for observation after admission by hospitalist or gastrointestinal team as indicated. The operating room procedure has been scheduled for 04/14/2014 at 7:30 AM at [facility] pending discussion with Dr. Watt Climes.  2. Discussion of findings and coordination of dental care with the medical team members.    Lenn Cal, DDS

## 2014-04-06 NOTE — Patient Instructions (Signed)
Patient to return to [facility] for operating room procedure as scheduled. Dr. Enrique Sack

## 2014-04-07 ENCOUNTER — Encounter (HOSPITAL_COMMUNITY)
Admission: RE | Admit: 2014-04-07 | Discharge: 2014-04-07 | Disposition: A | Discharge status: Home / Self Care | Payer: Medicare Other | Source: Ambulatory Visit | Attending: Dentistry | Admitting: Dentistry

## 2014-04-07 ENCOUNTER — Encounter (HOSPITAL_COMMUNITY): Payer: Self-pay

## 2014-04-07 ENCOUNTER — Ambulatory Visit (HOSPITAL_COMMUNITY)
Admission: RE | Admit: 2014-04-07 | Discharge: 2014-04-07 | Disposition: A | Discharge status: Home / Self Care | Payer: Medicare Other | Source: Ambulatory Visit | Attending: Dentistry | Admitting: Dentistry

## 2014-04-07 DIAGNOSIS — J449 Chronic obstructive pulmonary disease, unspecified: Secondary | ICD-10-CM | POA: Diagnosis not present

## 2014-04-07 DIAGNOSIS — Z01818 Encounter for other preprocedural examination: Secondary | ICD-10-CM | POA: Insufficient documentation

## 2014-04-07 DIAGNOSIS — K7460 Unspecified cirrhosis of liver: Secondary | ICD-10-CM | POA: Diagnosis not present

## 2014-04-07 HISTORY — DX: Adverse effect of unspecified anesthetic, initial encounter: T41.45XA

## 2014-04-07 HISTORY — DX: Other complications of anesthesia, initial encounter: T88.59XA

## 2014-04-07 HISTORY — DX: Cardiac arrhythmia, unspecified: I49.9

## 2014-04-07 HISTORY — DX: Pneumonia, unspecified organism: J18.9

## 2014-04-07 HISTORY — DX: Chronic kidney disease, unspecified: N18.9

## 2014-04-07 HISTORY — DX: Thrombocytopenia, unspecified: D69.6

## 2014-04-07 LAB — APTT: APTT: 39 s — AB (ref 24–37)

## 2014-04-07 LAB — CBC
HCT: 26.2 % — ABNORMAL LOW (ref 39.0–52.0)
Hemoglobin: 8.8 g/dL — ABNORMAL LOW (ref 13.0–17.0)
MCH: 31.3 pg (ref 26.0–34.0)
MCHC: 33.6 g/dL (ref 30.0–36.0)
MCV: 93.2 fL (ref 78.0–100.0)
Platelets: 79 10*3/uL — ABNORMAL LOW (ref 150–400)
RBC: 2.81 MIL/uL — ABNORMAL LOW (ref 4.22–5.81)
RDW: 18.6 % — ABNORMAL HIGH (ref 11.5–15.5)
WBC: 5.5 10*3/uL (ref 4.0–10.5)

## 2014-04-07 LAB — PROTIME-INR
INR: 1.48 (ref 0.00–1.49)
Prothrombin Time: 18.1 seconds — ABNORMAL HIGH (ref 11.6–15.2)

## 2014-04-07 LAB — COMPREHENSIVE METABOLIC PANEL
ALBUMIN: 2.6 g/dL — AB (ref 3.5–5.2)
ALT: 32 U/L (ref 0–53)
ANION GAP: 9 (ref 5–15)
AST: 50 U/L — ABNORMAL HIGH (ref 0–37)
Alkaline Phosphatase: 125 U/L — ABNORMAL HIGH (ref 39–117)
BUN: 12 mg/dL (ref 6–23)
CO2: 26 mmol/L (ref 19–32)
Calcium: 8.2 mg/dL — ABNORMAL LOW (ref 8.4–10.5)
Chloride: 94 mmol/L — ABNORMAL LOW (ref 96–112)
Creatinine, Ser: 1.01 mg/dL (ref 0.50–1.35)
GFR calc Af Amer: 86 mL/min — ABNORMAL LOW (ref >90)
GFR calc non Af Amer: 74 mL/min — ABNORMAL LOW (ref >90)
Glucose, Bld: 107 mg/dL — ABNORMAL HIGH (ref 70–99)
POTASSIUM: 4.1 mmol/L (ref 3.5–5.1)
Sodium: 129 mmol/L — ABNORMAL LOW (ref 135–145)
Total Bilirubin: 2.2 mg/dL — ABNORMAL HIGH (ref 0.3–1.2)
Total Protein: 5.7 g/dL — ABNORMAL LOW (ref 6.0–8.3)

## 2014-04-07 NOTE — Progress Notes (Signed)
Dental Clinic office of Dr Enrique Sack made aware labs of CBC, CMP and PT results done 04/07/2014  routed to Dr Tommie Raymond via [facility].  Dr Enrique Sack will be made aware per Lattie Haw in [facility] office.  Once PTT result done 04/07/2014 routed via EPIC to Dr Enrique Sack.  Paged Dr Enrique Sack and he returned page and made aware that lab results done 04/07/2014 had been routed to him in [facility], Anesthesia ( Dr Delma Post who is aware of patient ) given copy of lab results and he will let Dr Williemae Area be aware of lab results done 04/07/14.  Made Dr Enrique Sack aware that CBC along with Type and Screen ordered am of surgery per anesthesia.  Dr Enrique Sack stated he will call blood bank and talk with them regarding FFP prior to surgery.

## 2014-04-07 NOTE — Progress Notes (Signed)
Anesthesia- Dr Oren Bracket in to see patient  For anesthesia consult.  Daughter with patient at time of anesthesia consult.  PT, PTT ordered by anesthesia and done .

## 2014-04-07 NOTE — Anesthesia Preprocedure Evaluation (Addendum)
Anesthesia Evaluation  Patient identified by MRN, date of birth, ID band Patient awake    Reviewed: Allergy & Precautions, NPO status , Patient's Chart, lab work & pertinent test results  History of Anesthesia Complications (+) history of anesthetic complications (slow to emerge)  Airway Mallampati: II  TM Distance: >3 FB Neck ROM: Full    Dental no notable dental hx. (+) Poor Dentition   Pulmonary COPD COPD inhaler, Current Smoker,  CXR 3/16: COPD and mild pulmonary fibrotic changes. There is no CHF nor pneumonia breath sounds clear to auscultation  Pulmonary exam normal       Cardiovascular + dysrhythmias Rhythm:Regular Rate:Normal     Neuro/Psych negative neurological ROS  negative psych ROS   GI/Hepatic PUD, (+) Cirrhosis -  ascites    ,   Endo/Other  negative endocrine ROS  Renal/GU Renal InsufficiencyRenal disease  negative genitourinary   Musculoskeletal negative musculoskeletal ROS (+)   Abdominal   Peds negative pediatric ROS (+)  Hematology  (+) anemia , Thrombocytopenia    Anesthesia Other Findings   Reproductive/Obstetrics negative OB ROS                            Anesthesia Physical Anesthesia Plan  ASA: IV  Anesthesia Plan: General   Post-op Pain Management:    Induction: Intravenous and Rapid sequence  Airway Management Planned: Oral ETT  Additional Equipment:   Intra-op Plan:   Post-operative Plan: Extubation in OR  Informed Consent: I have reviewed the patients History and Physical, chart, labs and discussed the procedure including the risks, benefits and alternatives for the proposed anesthesia with the patient or authorized representative who has indicated his/her understanding and acceptance.   Dental advisory given  Plan Discussed with: CRNA  Anesthesia Plan Comments:         Anesthesia Quick Evaluation

## 2014-04-07 NOTE — Progress Notes (Signed)
Penicillins cause rash hives and white spots per patient .  Ancef ordered preop.

## 2014-04-07 NOTE — Progress Notes (Signed)
EKG- 02/01/14 EPIC  ECHO- 12/13/13 EPIC

## 2014-04-14 ENCOUNTER — Encounter (HOSPITAL_COMMUNITY): Payer: Self-pay | Admitting: *Deleted

## 2014-04-14 ENCOUNTER — Ambulatory Visit (HOSPITAL_COMMUNITY): Payer: Medicare Other | Admitting: Anesthesiology

## 2014-04-14 ENCOUNTER — Encounter (HOSPITAL_COMMUNITY)
Admission: RE | Disposition: A | Discharge status: Home / Self Care | Payer: Self-pay | Source: Ambulatory Visit | Attending: Internal Medicine

## 2014-04-14 ENCOUNTER — Observation Stay (HOSPITAL_COMMUNITY)
Admission: RE | Admit: 2014-04-14 | Discharge: 2014-04-15 | Disposition: A | Discharge status: Home / Self Care | Payer: Medicare Other | Source: Ambulatory Visit | Attending: Internal Medicine | Admitting: Internal Medicine

## 2014-04-14 DIAGNOSIS — K7469 Other cirrhosis of liver: Secondary | ICD-10-CM | POA: Diagnosis not present

## 2014-04-14 DIAGNOSIS — R188 Other ascites: Secondary | ICD-10-CM | POA: Diagnosis not present

## 2014-04-14 DIAGNOSIS — F17210 Nicotine dependence, cigarettes, uncomplicated: Secondary | ICD-10-CM | POA: Diagnosis not present

## 2014-04-14 DIAGNOSIS — K279 Peptic ulcer, site unspecified, unspecified as acute or chronic, without hemorrhage or perforation: Secondary | ICD-10-CM | POA: Insufficient documentation

## 2014-04-14 DIAGNOSIS — D696 Thrombocytopenia, unspecified: Secondary | ICD-10-CM | POA: Insufficient documentation

## 2014-04-14 DIAGNOSIS — K7460 Unspecified cirrhosis of liver: Secondary | ICD-10-CM | POA: Diagnosis present

## 2014-04-14 DIAGNOSIS — D649 Anemia, unspecified: Secondary | ICD-10-CM | POA: Diagnosis not present

## 2014-04-14 DIAGNOSIS — D638 Anemia in other chronic diseases classified elsewhere: Secondary | ICD-10-CM | POA: Diagnosis present

## 2014-04-14 DIAGNOSIS — D689 Coagulation defect, unspecified: Secondary | ICD-10-CM | POA: Diagnosis not present

## 2014-04-14 DIAGNOSIS — E875 Hyperkalemia: Secondary | ICD-10-CM | POA: Insufficient documentation

## 2014-04-14 DIAGNOSIS — K0530 Chronic periodontitis, unspecified: Secondary | ICD-10-CM | POA: Diagnosis present

## 2014-04-14 DIAGNOSIS — K089 Disorder of teeth and supporting structures, unspecified: Secondary | ICD-10-CM | POA: Diagnosis present

## 2014-04-14 DIAGNOSIS — J449 Chronic obstructive pulmonary disease, unspecified: Secondary | ICD-10-CM | POA: Diagnosis not present

## 2014-04-14 DIAGNOSIS — N183 Chronic kidney disease, stage 3 (moderate): Secondary | ICD-10-CM | POA: Insufficient documentation

## 2014-04-14 DIAGNOSIS — R7881 Bacteremia: Secondary | ICD-10-CM

## 2014-04-14 DIAGNOSIS — E871 Hypo-osmolality and hyponatremia: Secondary | ICD-10-CM | POA: Diagnosis not present

## 2014-04-14 DIAGNOSIS — L02519 Cutaneous abscess of unspecified hand: Secondary | ICD-10-CM | POA: Diagnosis not present

## 2014-04-14 DIAGNOSIS — K7581 Nonalcoholic steatohepatitis (NASH): Secondary | ICD-10-CM | POA: Diagnosis not present

## 2014-04-14 DIAGNOSIS — IMO0002 Reserved for concepts with insufficient information to code with codable children: Secondary | ICD-10-CM | POA: Diagnosis present

## 2014-04-14 DIAGNOSIS — K7290 Hepatic failure, unspecified without coma: Secondary | ICD-10-CM | POA: Insufficient documentation

## 2014-04-14 DIAGNOSIS — K045 Chronic apical periodontitis: Principal | ICD-10-CM | POA: Insufficient documentation

## 2014-04-14 DIAGNOSIS — N179 Acute kidney failure, unspecified: Secondary | ICD-10-CM | POA: Diagnosis not present

## 2014-04-14 DIAGNOSIS — Z880 Allergy status to penicillin: Secondary | ICD-10-CM | POA: Insufficient documentation

## 2014-04-14 DIAGNOSIS — Z888 Allergy status to other drugs, medicaments and biological substances status: Secondary | ICD-10-CM | POA: Diagnosis not present

## 2014-04-14 DIAGNOSIS — K029 Dental caries, unspecified: Secondary | ICD-10-CM | POA: Diagnosis present

## 2014-04-14 DIAGNOSIS — K083 Retained dental root: Secondary | ICD-10-CM | POA: Diagnosis present

## 2014-04-14 DIAGNOSIS — B955 Unspecified streptococcus as the cause of diseases classified elsewhere: Secondary | ICD-10-CM

## 2014-04-14 HISTORY — PX: MULTIPLE EXTRACTIONS WITH ALVEOLOPLASTY: SHX5342

## 2014-04-14 HISTORY — DX: Respiratory failure, unspecified with hypoxia: J96.91

## 2014-04-14 HISTORY — DX: Cutaneous abscess of unspecified hand: L02.519

## 2014-04-14 HISTORY — DX: Fracture of one rib, unspecified side, initial encounter for closed fracture: S22.39XA

## 2014-04-14 HISTORY — DX: Pneumonia due to other streptococci: J15.4

## 2014-04-14 HISTORY — DX: Fracture of unspecified part of neck of unspecified femur, initial encounter for closed fracture: S72.009A

## 2014-04-14 HISTORY — DX: Unspecified atrial flutter: I48.92

## 2014-04-14 HISTORY — DX: Hypo-osmolality and hyponatremia: E87.1

## 2014-04-14 HISTORY — DX: Hepatic encephalopathy: K76.82

## 2014-04-14 HISTORY — DX: Hepatic failure, unspecified without coma: K72.90

## 2014-04-14 LAB — CBC
HCT: 24.5 % — ABNORMAL LOW (ref 39.0–52.0)
Hemoglobin: 8.3 g/dL — ABNORMAL LOW (ref 13.0–17.0)
MCH: 31.3 pg (ref 26.0–34.0)
MCHC: 33.9 g/dL (ref 30.0–36.0)
MCV: 92.5 fL (ref 78.0–100.0)
PLATELETS: 61 10*3/uL — AB (ref 150–400)
RBC: 2.65 MIL/uL — ABNORMAL LOW (ref 4.22–5.81)
RDW: 18.9 % — ABNORMAL HIGH (ref 11.5–15.5)
WBC: 5.4 10*3/uL (ref 4.0–10.5)

## 2014-04-14 LAB — TYPE AND SCREEN
ABO/RH(D): O NEG
ANTIBODY SCREEN: NEGATIVE

## 2014-04-14 SURGERY — MULTIPLE EXTRACTION WITH ALVEOLOPLASTY
Anesthesia: General | Site: Mouth

## 2014-04-14 MED ORDER — EPHEDRINE SULFATE 50 MG/ML IJ SOLN
INTRAMUSCULAR | Status: AC
  Filled 2014-04-14: qty 1

## 2014-04-14 MED ORDER — SUCCINYLCHOLINE CHLORIDE 20 MG/ML IJ SOLN
INTRAMUSCULAR | Status: DC | PRN
  Administered 2014-04-14: 80 mg via INTRAVENOUS

## 2014-04-14 MED ORDER — RIFAXIMIN 550 MG PO TABS
550.0000 mg | ORAL_TABLET | Freq: Two times a day (BID) | ORAL | Status: DC
  Administered 2014-04-14 – 2014-04-15 (×3): 550 mg via ORAL
  Filled 2014-04-14 (×5): qty 1

## 2014-04-14 MED ORDER — OXYCODONE HCL 5 MG PO TABS
5.0000 mg | ORAL_TABLET | Freq: Four times a day (QID) | ORAL | Status: DC | PRN
  Administered 2014-04-14 (×2): 5 mg via ORAL
  Filled 2014-04-14 (×2): qty 1

## 2014-04-14 MED ORDER — SODIUM CHLORIDE 0.9 % IV SOLN: Freq: Once | INTRAVENOUS | Status: DC

## 2014-04-14 MED ORDER — LIDOCAINE-EPINEPHRINE 2 %-1:100000 IJ SOLN
INTRAMUSCULAR | Status: AC
  Filled 2014-04-14: qty 6.8

## 2014-04-14 MED ORDER — CEFAZOLIN SODIUM-DEXTROSE 2-3 GM-% IV SOLR
2.0000 g | Freq: Once | INTRAVENOUS | Status: AC
  Administered 2014-04-14: 2 g via INTRAVENOUS

## 2014-04-14 MED ORDER — LACTATED RINGERS IV SOLN
INTRAVENOUS | Status: DC | PRN
  Administered 2014-04-14 (×2): via INTRAVENOUS

## 2014-04-14 MED ORDER — PROPOFOL 10 MG/ML IV BOLUS
INTRAVENOUS | Status: DC | PRN
  Administered 2014-04-14: 80 mg via INTRAVENOUS

## 2014-04-14 MED ORDER — LACTULOSE 10 GM/15ML PO SOLN
20.0000 g | Freq: Every day | ORAL | Status: DC
  Administered 2014-04-14: 10 g via ORAL
  Administered 2014-04-15: 20 g via ORAL
  Filled 2014-04-14 (×2): qty 30

## 2014-04-14 MED ORDER — MAGNESIUM GLUCONATE 500 MG PO TABS
250.0000 mg | ORAL_TABLET | Freq: Every day | ORAL | Status: DC
  Administered 2014-04-14 – 2014-04-15 (×2): 250 mg via ORAL
  Filled 2014-04-14 (×2): qty 1

## 2014-04-14 MED ORDER — MAGNESIUM 250 MG PO TABS: 250.0000 mg | ORAL_TABLET | Freq: Every day | ORAL | Status: DC

## 2014-04-14 MED ORDER — BUPIVACAINE-EPINEPHRINE (PF) 0.5% -1:200000 IJ SOLN
INTRAMUSCULAR | Status: AC
  Filled 2014-04-14: qty 7.2

## 2014-04-14 MED ORDER — BOOST PO LIQD
237.0000 mL | Freq: Every day | ORAL | Status: DC | PRN
  Filled 2014-04-14: qty 237

## 2014-04-14 MED ORDER — LIDOCAINE-EPINEPHRINE 2 %-1:100000 IJ SOLN
INTRAMUSCULAR | Status: DC | PRN
Start: 2014-04-14 — End: 2014-04-14
  Administered 2014-04-14: 1.7 mL

## 2014-04-14 MED ORDER — FENTANYL CITRATE 0.05 MG/ML IJ SOLN
25.0000 ug | INTRAMUSCULAR | Status: DC | PRN
  Administered 2014-04-14 (×3): 25 ug via INTRAVENOUS

## 2014-04-14 MED ORDER — MIDAZOLAM HCL 2 MG/2ML IJ SOLN
INTRAMUSCULAR | Status: AC
  Filled 2014-04-14: qty 2

## 2014-04-14 MED ORDER — PHENYLEPHRINE HCL 10 MG/ML IJ SOLN
INTRAMUSCULAR | Status: DC | PRN
  Administered 2014-04-14: 40 ug via INTRAVENOUS
  Administered 2014-04-14: 80 ug via INTRAVENOUS

## 2014-04-14 MED ORDER — ONDANSETRON HCL 4 MG/2ML IJ SOLN
INTRAMUSCULAR | Status: AC
  Filled 2014-04-14: qty 2

## 2014-04-14 MED ORDER — FENTANYL CITRATE 0.05 MG/ML IJ SOLN
INTRAMUSCULAR | Status: AC
  Filled 2014-04-14: qty 2

## 2014-04-14 MED ORDER — LACTATED RINGERS IV SOLN: INTRAVENOUS | Status: DC

## 2014-04-14 MED ORDER — ISOPROPYL ALCOHOL 70 % SOLN
Status: AC
  Filled 2014-04-14: qty 480

## 2014-04-14 MED ORDER — DEXAMETHASONE SODIUM PHOSPHATE 10 MG/ML IJ SOLN
INTRAMUSCULAR | Status: AC
  Filled 2014-04-14: qty 1

## 2014-04-14 MED ORDER — SPIRONOLACTONE 100 MG PO TABS
100.0000 mg | ORAL_TABLET | Freq: Every day | ORAL | Status: DC
  Administered 2014-04-14 – 2014-04-15 (×2): 100 mg via ORAL
  Filled 2014-04-14 (×2): qty 1

## 2014-04-14 MED ORDER — AMINOCAPROIC ACID SOLUTION 5% (50 MG/ML)
10.0000 mL | ORAL | Status: DC
  Administered 2014-04-14 (×10): 10 mL via ORAL
  Filled 2014-04-14 (×3): qty 100

## 2014-04-14 MED ORDER — PANTOPRAZOLE SODIUM 40 MG PO TBEC
40.0000 mg | DELAYED_RELEASE_TABLET | Freq: Two times a day (BID) | ORAL | Status: DC
  Administered 2014-04-14 – 2014-04-15 (×3): 40 mg via ORAL
  Filled 2014-04-14 (×4): qty 1

## 2014-04-14 MED ORDER — LIDOCAINE HCL (PF) 2 % IJ SOLN
INTRAMUSCULAR | Status: DC | PRN
  Administered 2014-04-14: 60 mg via INTRADERMAL

## 2014-04-14 MED ORDER — PROPOFOL 10 MG/ML IV BOLUS
INTRAVENOUS | Status: AC
  Filled 2014-04-14: qty 20

## 2014-04-14 MED ORDER — LACTATED RINGERS IV SOLN
INTRAVENOUS | Status: DC
  Administered 2014-04-14: 11:00:00 via INTRAVENOUS

## 2014-04-14 MED ORDER — VITAMIN D (ERGOCALCIFEROL) 1.25 MG (50000 UNIT) PO CAPS: 50000.0000 [IU] | ORAL_CAPSULE | ORAL | Status: DC

## 2014-04-14 MED ORDER — DEXAMETHASONE SODIUM PHOSPHATE 10 MG/ML IJ SOLN
INTRAMUSCULAR | Status: DC | PRN
  Administered 2014-04-14: 10 mg via INTRAVENOUS

## 2014-04-14 MED ORDER — ONDANSETRON HCL 4 MG/2ML IJ SOLN
INTRAMUSCULAR | Status: DC | PRN
  Administered 2014-04-14: 4 mg via INTRAVENOUS

## 2014-04-14 MED ORDER — LIDOCAINE HCL (CARDIAC) 20 MG/ML IV SOLN
INTRAVENOUS | Status: AC
  Filled 2014-04-14: qty 5

## 2014-04-14 MED ORDER — ONDANSETRON HCL 4 MG/2ML IJ SOLN: 4.0000 mg | Freq: Once | INTRAMUSCULAR | Status: DC | PRN

## 2014-04-14 MED ORDER — BUPIVACAINE-EPINEPHRINE (PF) 0.5% -1:200000 IJ SOLN
INTRAMUSCULAR | Status: DC | PRN
  Administered 2014-04-14: 1.8 mL

## 2014-04-14 MED ORDER — ISOPROPYL ALCOHOL 70 % SOLN
Status: DC | PRN
  Administered 2014-04-14: 1 via TOPICAL

## 2014-04-14 MED ORDER — LIDOCAINE-EPINEPHRINE 2 %-1:100000 IJ SOLN
INTRAMUSCULAR | Status: AC
  Filled 2014-04-14: qty 3.4

## 2014-04-14 MED ORDER — ADULT MULTIVITAMIN W/MINERALS CH
1.0000 | ORAL_TABLET | Freq: Every day | ORAL | Status: DC
  Administered 2014-04-14 – 2014-04-15 (×2): 1 via ORAL
  Filled 2014-04-14 (×2): qty 1

## 2014-04-14 MED ORDER — DIPHENHYDRAMINE HCL 25 MG PO CAPS
25.0000 mg | ORAL_CAPSULE | ORAL | Status: DC | PRN
  Administered 2014-04-14: 25 mg via ORAL
  Filled 2014-04-14: qty 1

## 2014-04-14 MED ORDER — FUROSEMIDE 80 MG PO TABS
80.0000 mg | ORAL_TABLET | Freq: Two times a day (BID) | ORAL | Status: DC
  Administered 2014-04-14 – 2014-04-15 (×2): 80 mg via ORAL
  Filled 2014-04-14 (×5): qty 1

## 2014-04-14 MED ORDER — STERILE WATER FOR INJECTION IJ SOLN
INTRAMUSCULAR | Status: AC
  Filled 2014-04-14: qty 10

## 2014-04-14 MED ORDER — FENTANYL CITRATE 0.05 MG/ML IJ SOLN
INTRAMUSCULAR | Status: DC | PRN
  Administered 2014-04-14 (×4): 25 ug via INTRAVENOUS

## 2014-04-14 MED ORDER — CEFAZOLIN SODIUM-DEXTROSE 2-3 GM-% IV SOLR
INTRAVENOUS | Status: AC
  Filled 2014-04-14: qty 50

## 2014-04-14 SURGICAL SUPPLY — 29 items
ATTRACTOMAT 16X20 MAGNETIC DRP (DRAPES) ×3 IMPLANT
BAG ZIPLOCK 12X15 (MISCELLANEOUS) IMPLANT
BANDAGE EYE OVAL (MISCELLANEOUS) ×6 IMPLANT
BLADE SURG 15 STRL LF DISP TIS (BLADE) ×2 IMPLANT
BLADE SURG 15 STRL SS (BLADE) ×4
CANNULA VESSEL W/WING WO/VALVE (CANNULA) ×3 IMPLANT
GAUZE SPONGE 4X4 12PLY STRL (GAUZE/BANDAGES/DRESSINGS) ×3 IMPLANT
GAUZE SPONGE 4X4 16PLY XRAY LF (GAUZE/BANDAGES/DRESSINGS) ×9 IMPLANT
GLOVE BIOGEL PI IND STRL 6 (GLOVE) ×1 IMPLANT
GLOVE BIOGEL PI INDICATOR 6 (GLOVE) ×2
GLOVE SURG ORTHO 8.0 STRL STRW (GLOVE) ×3 IMPLANT
GLOVE SURG SS PI 6.0 STRL IVOR (GLOVE) ×3 IMPLANT
GOWN STRL REUS W/TWL 2XL LVL3 (GOWN DISPOSABLE) ×3 IMPLANT
GOWN STRL REUS W/TWL LRG LVL3 (GOWN DISPOSABLE) ×3 IMPLANT
HEMOSTAT SURGICEL 2X14 (HEMOSTASIS) ×3 IMPLANT
KIT BASIN OR (CUSTOM PROCEDURE TRAY) ×3 IMPLANT
NS IRRIG 1000ML POUR BTL (IV SOLUTION) ×3 IMPLANT
PACK EENT SPLIT (PACKS) ×3 IMPLANT
PACKING VAGINAL (PACKING) ×3 IMPLANT
SPONGE SURGIFOAM ABS GEL 12-7 (HEMOSTASIS) ×3 IMPLANT
SUCTION FRAZIER 12FR DISP (SUCTIONS) IMPLANT
SUT CHROMIC 3 0 PS 2 (SUTURE) ×9 IMPLANT
SUT CHROMIC 4 0 P 3 18 (SUTURE) ×3 IMPLANT
SYR 50ML LL SCALE MARK (SYRINGE) ×3 IMPLANT
TOWEL OR 17X26 10 PK STRL BLUE (TOWEL DISPOSABLE) ×3 IMPLANT
TUBING CONNECTING 10 (TUBING) ×2 IMPLANT
TUBING CONNECTING 10' (TUBING) ×1
WATER STERILE IRR 1500ML POUR (IV SOLUTION) IMPLANT
YANKAUER SUCT BULB TIP NO VENT (SUCTIONS) ×3 IMPLANT

## 2014-04-14 NOTE — H&P (Signed)
04/14/2014  Patient:            Clifford Cook Date of Birth:  Nov 17, 1945 MRN:                [id number]   BP 92/45 mmHg  Pulse 88  Temp(Src) 97.9 F (36.6 C) (Oral)  Resp 20  Ht 5\' 9"  (1.753 m)  Wt 194 lb 12 oz (88.338 kg)  BMI 28.75 kg/m2  SpO2 98%  MARQUIZE SEIB is a 69 year old male that presents for multiple dental extractions with alveoloplasty in the operating room with general anesthesia. Patient denies any acute medical or dental problems. Patient recently evaluated for increased ammonia level. Patient was treated appropriately. His platelets have decreased since initial presurgical testing labs. Patient will be treated with FFP and consider for platelet transfusion as indicated. Please use H an P note from Dr. Aileen Fass to act as H&P for dental operating room procedure. Clifford Cook is aware of the dental procedures scheduled for today. Clifford Cook, Clifford Cook  H&P by Clifford Cousins, Clifford Cook at 03/17/2014 12:35 PM    Author: Charlynne Cousins, Clifford Cook Service: Internal Medicine Author Type: Physician   Filed: 03/17/2014 12:50 PM Note Time: 03/17/2014 12:35 PM Status: Signed   Editor: Clifford Cousins, Clifford Cook (Physician)     Expand All Collapse All     [facility] PROGRESS NOTE  Assessment/Plan: AKI (acute kidney injury) - Urine Sodium was 20, his renal function has improved with holding diuretics. - I will continue to hold diuretics for today continue to monitor his electrolytes. - Restrict his fluid ingestion. - He will have to receive a unit of blood which will help with volume expansion.  Ascites - Paracentesis was done that revealed 4.9 L we'll go ahead and give him IV albumin. - He relates his abdominal discomfort is improved.  Normocytic Anemia: - His hemoglobin dropped from 8.1-7.1 he relates he feels little bit more tired today. - Go ahead and give him a unit of packed red blood cells. CBC in the morning.  Hyponatremia: - Likely due to the failure, only to  continue to hold the Aldactone as his serum creatinine is 2.3.  Thrombocytopenia - Improving, no signs of petechiae or bleeding.  Abscess of hand - Status post I&D has remained afebrile. - Continue IV vancomycin.  Anasarca - Apply TED hose.  Hyperkalemia - Multifactorial due to acute kidney injury, Bactrim, Aldactone indicated.  Poor oral hygiene: - Consulted the dentist.     Code Status: Full Family Communication: daughter and wife  Disposition Plan: inpatient   Consultants:  None  Procedures:  Interventional radiologist  Antibiotics:  Vancomycin 3.2.2016  HPI/Subjective: He relates his abdominal discomfort is improved  Objective: Filed Vitals:   03/17/14 1032 03/17/14 1037 03/17/14 1042 03/17/14 1048  BP: 108/55 107/48 105/50 114/54  Pulse:      Temp:      TempSrc:      Resp:      Height:      Weight:      SpO2:        Intake/Output Summary (Last 24 hours) at 03/17/14 1235 Last data filed at 03/16/14 1530  Gross per 24 hour  Intake  100 ml  Output  0 ml  Net  100 ml   Filed Weights   03/16/14 1409 03/17/14 0705  Weight: 91.173 kg (201 lb) 91.7 kg (202 lb 2.6 oz)    Exam:  General: Alert, awake, oriented x3, in no acute distress.  HEENT: No bruits, no goiter.  Heart: Regular rate and rhythm. Lungs: Good air movement, clear Abdomen: Soft, nontender, nondistended, positive bowel sounds.  Neuro: Grossly intact, nonfocal.   Data Reviewed: Basic Metabolic Panel:  Last Labs      Recent Labs Lab 03/16/14 0956 03/17/14 0540  NA 133* 132*  K 5.2* 4.2  CL 103 102  CO2 20 21  GLUCOSE 92 149*  BUN 39* 37*  CREATININE 2.81* 2.33*  CALCIUM 7.6* 7.5*     Liver Function Tests:  Last Labs      Recent Labs Lab 03/16/14 0956 03/17/14 0540  AST 47* 43*  ALT 31 31  ALKPHOS 126* 110  BILITOT 2.8* 2.3*  PROT 5.3* 5.1*   ALBUMIN 2.1* 1.9*      Last Labs      Recent Labs Lab 03/16/14 0956  LIPASE 35      Last Labs      Recent Labs Lab 03/16/14 1045  AMMONIA 135*     CBC:  Last Labs      Recent Labs Lab 03/16/14 0956 03/17/14 0540  WBC 5.7 7.0  NEUTROABS 3.6 --   HGB 8.0* 7.1*  HCT 23.2* 20.9*  MCV 91.3 92.1  PLT 76* 85*     Cardiac Enzymes:  Last Labs     No results for input(s): CKTOTAL, CKMB, CKMBINDEX, TROPONINI in the last 168 hours.   BNP (last 3 results)  Recent Labs (within last 365 days)    No results for input(s): BNP in the last 8760 hours.    ProBNP (last 3 results)  Recent Labs (within last 365 days)     Recent Labs  11/15/13 1237  PROBNP 864.3*      CBG:  Last Labs     No results for input(s): GLUCAP in the last 168 hours.    No results found for this or any previous visit (from the past 240 hour(s)).   Studies:  Imaging Results (Last 48 hours)    No results found.    Scheduled Meds: . doxycycline (VIBRAMYCIN) IV 100 mg Intravenous Q12H  . feeding supplement (RESOURCE BREEZE) 1 Container Oral TID BM  . iron polysaccharides 150 mg Oral Q breakfast  . magnesium gluconate 250 mg Oral Daily  . pantoprazole 40 mg Oral Daily  . rifaximin 550 mg Oral BID  . sodium chloride 3 mL Intravenous Q12H  . sodium chloride 3 mL Intravenous Q12H   Continuous Infusions:    Clifford Cook [facility] Pager [phone]. If 7PM-7AM, please contact night-coverage at [email or web address], password [email or web address] 03/17/2014, 12:35 PM  LOS: 1 day

## 2014-04-14 NOTE — Anesthesia Postprocedure Evaluation (Signed)
  Anesthesia Post-op Note  Patient: Clifford Cook  Procedure(s) Performed: Procedure(s) (LRB): Extraction of tooth #'s 3,4,5,6,7,8,9,10,11,15,21,22,23,24,25,26,27,28 WITH ALVEOLOPLASTY (N/A)  Patient Location: PACU  Anesthesia Type: General  Level of Consciousness: awake and alert   Airway and Oxygen Therapy: Patient Spontanous Breathing  Post-op Pain: mild  Post-op Assessment: Post-op Vital signs reviewed, Patient's Cardiovascular Status Stable, Respiratory Function Stable, Patent Airway and No signs of Nausea or vomiting  Last Vitals:  Filed Vitals:   04/14/14 1105  BP: 99/55  Pulse: 85  Temp: 36.4 C  Resp: 12    Post-op Vital Signs: stable   Complications: No apparent anesthesia complications

## 2014-04-14 NOTE — Discharge Instructions (Signed)

## 2014-04-14 NOTE — Progress Notes (Signed)
04/14/2014  Patient:            Clifford Cook Date of Birth:  [birth year] MRN:                [id number]   BP 97/61 mmHg  Pulse 81  Temp(Src) 97.5 F (36.4 C) (Oral)  Resp 15  Ht 5\' 9"  (1.753 m)  Wt 194 lb 12 oz (88.338 kg)  BMI 28.75 kg/m2  SpO2 99%  Jenetta Downer is status post extraction remaining teeth with alveoloplasty operating room with general anesthesia. Patient received units of fresh frozen plasma and a sixpack of platelets perioperatively. Patient was admitted by hospitalist for evaluation for 23 hour observation. The patient is currently eating a primarily liquid diet. Dietary consult has been ordered.  SUBJECTIVE: Patient with minimal discomfort. Patient has used oxycodone with good relief. Patient denies having any significant oral bleeding.  OBJECTIVE: There is no sign of infection. Minimal heme is noted within the mouth. Patient is now edentulous. Sutures are intact and clots are stable.  ASSESSMENT: Postop course is consistent with dental procedures in the operating room.  Plan/recommendations: 1. Continue Amicar rinses every hour as prescribed to help maintain hemostasis. 2. No straws or vigorous spitting or swishing. 3. Start saltwater rinses tomorrow around noon. 4. Advance diet as tolerated.  Lenn Cal, DDS

## 2014-04-14 NOTE — Anesthesia Procedure Notes (Signed)
Procedure Name: Intubation Date/Time: 04/14/2014 7:44 AM Performed by: Lajuana Carry E Pre-anesthesia Checklist: Patient identified, Emergency Drugs available, Suction available and Patient being monitored Patient Re-evaluated:Patient Re-evaluated prior to inductionOxygen Delivery Method: Circle System Utilized Preoxygenation: Pre-oxygenation with 100% oxygen Intubation Type: IV induction Ventilation: Mask ventilation without difficulty Grade View: Grade I Tube type: Oral Rae Tube size: 7.0 mm Number of attempts: 1 Airway Equipment and Method: Stylet and Video-laryngoscopy Placement Confirmation: ETT inserted through vocal cords under direct vision,  positive ETCO2 and breath sounds checked- equal and bilateral Secured at: 20 cm Tube secured with: Tape Dental Injury: Teeth and Oropharynx as per pre-operative assessment  Comments: Glidescope used d/t prior glidescope use. Grade 1 view, no difficulty getting ETT in.

## 2014-04-14 NOTE — Op Note (Signed)
OPERATIVE REPORT  Patient:            Clifford Cook Date of Birth:  09-Sep-1945 MRN:                [id number]   DATE OF PROCEDURE:  04/14/2014  PREOPERATIVE DIAGNOSES: 1.   Liver cirrhosis secondary to NASH 2.   Thrombocytopenia 3.   Chronic apical periodontitis 4.   Retained roots 5.   Rampant dental caries 6.   Chronic periodontitis  POSTOPERATIVE DIAGNOSES: 1.   Liver cirrhosis secondary to NASH 2.   Thrombocytopenia 3.   Chronic apical periodontitis 4.   Retained roots 5.   Rampant dental caries 6.   Chronic periodontitis  OPERATIONS: 1. Multiple extraction of tooth numbers 3, 4, 5, 6, 7, 8, 9, 10, 11, 15, 21, 22, 23, 24, 25, 26, 27, and 28. 2. 4 Quadrants of alveoloplasty   SURGEON: Lenn Cal, DDS  ASSISTANT: Camie Patience, (dental assistant)  ANESTHESIA: General anesthesia via oral endotracheal tube.  MEDICATIONS: 1. Ancef 2 g IV prior to invasive dental procedures. 2. Local anesthesia with a total utilization of 6 carpules each containing 34 mg of lidocaine with 0.017 mg of epinephrine as well as 2 carpules each containing 9 mg of bupivacaine with 0.009 mg of epinephrine.  SPECIMENS: There are 18 teeth that were discarded.  DRAINS: None  CULTURES: None  COMPLICATIONS: None   ESTIMATED BLOOD LOSS: 200 mLs.  INTRAVENOUS FLUIDS: 1000 mLs of Lactated ringers solution, 100 mL normal saline solution, 2 units of FFP, and a six pack of platelets.  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 A dental consultation was then requested during a hospital admission to evaluate poor dentition and to rule out dental infection that may affect the patient's systemic health.  The patient was examined and treatment planned for extraction of remaining teeth with alveoloplasty.  This treatment plan was formulated to decrease the risks and complications associated with dental infection from further affecting the patient's  systemic health and to assist in prosthodontics rehabilitation of the patient.  OPERATIVE FINDINGS: Patient was examined operating room number 3.  The teeth were identified for extraction. The patient was noted be affected by chronic apical periodontitis, multiple retained root segments, rampant dental caries, and chronic periodontitis with tooth mobility.    DESCRIPTION OF PROCEDURE: Patient was brought to the main operating room number 3. Patient was then placed in the supine position on the operating table. General anesthesia was then induced per the anesthesia team. The patient was then prepped and draped in the usual manner for dental medicine procedure. A timeout was performed. The patient was identified and procedures were verified. A throat pack was placed at this time. The oral cavity was then thoroughly examined with the findings noted above. The patient was then ready for dental medicine procedure as follows:  Local anesthesia was then administered sequentially with a total utilization of 6 carpules each containing 34 mg of lidocaine with 0.017 mg of epinephrine as well as 2 carpules  each containing 9 mg bupivacaine with 0.009 mg of epinephrine.  The Maxillary left and right quadrants first approached. Anesthesia was then delivered utilizing infiltration with lidocaine with epinephrine. A #15 blade incision was then made from the distal of #2 and extended to the distal of #15.  A  surgical flap was then carefully reflected. The teeth were then subluxated with a series of straight elevators. Tooth numbers 3, 4, 5, 6, 7,  8, 9, 10, 11, and 15 were then removed with a 150 forceps and a Rongeurs as indicated.  Alveoloplasty was then performed utilizing a ronguers and bone file. The surgical site was then irrigated with copious amounts of sterile saline. A piece of Surgical was then placed in each extraction socket appropriately. The maxillary right sinus was noted at this time and a piece of  Surgifoam was placed over the sinus in the area of tooth numbers 3 and 4. The tissues were approximated and trimmed appropriately. The surgical site was then closed from the distal of #2 and extended the mesial #8 utilizing 3-0 chromic gut suture in a continuous interrupted suture technique 1. Primary closure was obtained in the area of tooth numbers 3 through 4. One individual interrupted suture was then placed further closed surgical site. The maxillary left surgical site was then closed from the distal of #15 extended the mesial #9 utilizing 3-0 chromic gut suture in a continuous interrupted suture technique 1.  At this point time, the mandibular quadrants were approached. The patient was given bilateral inferior alveolar nerve blocks and long buccal nerve blocks utilizing the bupivacaine with epinephrine. Further infiltration was then achieved utilizing the lidocaine with epinephrine. A 15 blade incision was then made from the distal of number 20 and extended to the distal of #30.  A surgical flap was then carefully reflected. The lower teeth were then subluxated with a series straight elevators. Tooth numbers 21, 22, 23, 24, 25, 26, 27, 28 were then removed with a 151 forceps or rongeur as indicated. Alveoloplasty was then performed utilizing a rongeurs and bone file. The tissues were approximated and trimmed appropriately. The surgical sites were then irrigated with copious amounts of sterile saline. A piece of Surgicel was then placed these extraction socket appropriately. The mandibular left surgical site was then closed from the distal of  20 and extended the mesial #24 utilizing 3-0 chromic gut suture in a continuous interrupted suture technique 1. The mandibular right surgical site was then closed from the distal of #30 and extended the mesial #25 utilizing 3-0 chromic gut suture in a continuous interrupted suture technique 1.  At this point time, the entire mouth was irrigated with copious  amounts of sterile saline. An oral ulceration approximately 1 cm long involving the left floor of mouth was then evaluated and it was determined that this should be closed. This area was further irrigated with sterile saline and then closed utilizing 5 separate interrupted sutures and 4-0 chromic gut material. The oral ulceration buccal to #27 and 28 was noted but would not be closed at this time. The patient was examined for additional complications, seeing none, the dental medicine procedure was deemed to be complete. The throat pack was removed at this time. An oral airway was then placed at the request of the anesthesia team. A series of 4 x 4 gauze moistened with Amicar were  placed in the mouth to aid hemostasis. The patient was then handed over to the anesthesia team for final disposition. After an appropriate amount of time, the patient was extubated and taken to the postanesthsia care unit in good condition. All counts were correct for the dental medicine procedure. The patient is to continue Amicar 5% oral rinse. Patient is rinse with 10 ML's every hour for the next 10 hours and then as needed for persistent oozing. Patient is to use in a swish and spit manner. Patient is to be admitted for overnight observation by the  hospitalist team. CBC will be obtained and additional blood products provided as indicated. Dr. Watt Climes will be a made aware of this admission with follow-up coverage by the gastroenterology team as indicated.   Lenn Cal, DDS.

## 2014-04-14 NOTE — Transfer of Care (Signed)
Immediate Anesthesia Transfer of Care Note  Patient: Clifford Cook  Procedure(s) Performed: Procedure(s) (LRB): Extraction of tooth #'s 3,4,5,6,7,8,9,10,11,15,21,22,23,24,25,26,27,28 WITH ALVEOLOPLASTY (N/A)  Patient Location: PACU  Anesthesia Type: General  Level of Consciousness: sedated, patient cooperative and responds to stimulation  Airway & Oxygen Therapy: Patient Spontanous Breathing and Patient connected to face mask oxgen  Post-op Assessment: Report given to PACU RN and Post -op Vital signs reviewed and stable  Post vital signs: Reviewed and stable  Complications: No apparent anesthesia complications

## 2014-04-14 NOTE — Progress Notes (Signed)
PRE-OPERATIVE NOTE:  04/14/2014 Clifford Cook [id number]  VITALS: BP 92/45 mmHg  Pulse 88  Temp(Src) 97.9 F (36.6 C) (Oral)  Resp 20  Ht 5\' 9"  (1.753 m)  Wt 194 lb 12 oz (88.338 kg)  BMI 28.75 kg/m2  SpO2 98%  Lab Results  Component Value Date   WBC 5.4 04/14/2014   HGB 8.3* 04/14/2014   HCT 24.5* 04/14/2014   MCV 92.5 04/14/2014   PLT 61* 04/14/2014   BMET    Component Value Date/Time   NA 129* 04/07/2014 0845   K 4.1 04/07/2014 0845   CL 94* 04/07/2014 0845   CO2 26 04/07/2014 0845   GLUCOSE 107* 04/07/2014 0845   BUN 12 04/07/2014 0845   CREATININE 1.01 04/07/2014 0845   CALCIUM 8.2* 04/07/2014 0845   GFRNONAA 74* 04/07/2014 0845   GFRAA 86* 04/07/2014 0845    Lab Results  Component Value Date   INR 1.48 04/07/2014   INR 1.78* 03/20/2014   INR 1.66* 12/14/2013   No results found for: PTT   Clifford Cook presents for multiple dental extractions with alveoloplasty in the operating room and general anesthesia.    SUBJECTIVE: The patient denies any acute medical or dental changes and agrees to proceed with treatment as planned.  EXAM: No sign of acute dental changes.  ASSESSMENT: Patient is affected by chronic apical periodontitis, rampant dental caries, multiple retained root segments, chronic periodontitis, and tooth mobility.  PLAN: Patient agrees to proceed with treatment as planned in the operating room as previously discussed and accepts the risks, benefits, and complications of the proposed treatment. Patient is aware of the risk for bleeding, bruising, swelling, infection, pain, nerve damage, soft tissue damage, sinus involvement, root tip fracture, mandible fracture, and the risks of complications associated with the anesthesia. Patient is aware of the potential for complications up to and including death due to his overall medical compromise. Patient also is aware of the potential for other complications not mentioned above. Patient will receive FFP  transfusion and possible platelet transfusion as indicated based on clinical presentation at the time of the surgery.  Lenn Cal, DDS

## 2014-04-14 NOTE — H&P (Signed)
[facility] History and Physical  Clifford Cook ION:[id number] DOB: 1945-04-02 DOA: 04/14/2014  Referring physician: Dr. Enrique Sack  PCP: [name], MD  Specialists: dentist   Chief Complaint: post op, observation   HPI: Clifford Cook is a 69 y.o. male with PMH of NASH cirrhosis, h/o GIB, CKD, chronic thrombocytopenia, h/o hepatic encephalopathy presented for multiple dental extractions with alveoloplasty. post procedure, hospitalist asked for overnight observation due to risk of bleeding. Patient was transfused 2 units FFP, 6 packs of platelets periop to prevent bleeding complications.  -patient is alert, he denies any SOB, no chest pains, no hematemesis, no hematochezia; denies abdominal pains, no dizziness, no focal neuro symptoms   Review of Systems: The patient denies anorexia, fever, weight loss,, vision loss, decreased hearing, hoarseness, chest pain, syncope, dyspnea on exertion, peripheral edema, balance deficits, hemoptysis, abdominal pain, melena, hematochezia, severe indigestion/heartburn, hematuria, incontinence, genital sores, muscle weakness, suspicious skin lesions, transient blindness, difficulty walking, depression, unusual weight change, abnormal bleeding, enlarged lymph nodes, angioedema, and breast masses.    Past Medical History  Diagnosis Date  . Stomach ulcer   . GI bleed   . Liver disorder   . Cirrhosis   . Complication of anesthesia     slow to wake up with colonoscopy - 2013   . Dysrhythmia     afib, aflutter - during hospitalization and resolved no cardiac followup   . Pneumonia     septic pneumonia - 10/2013   . Chronic kidney disease     hx of stage III CKD   . Thrombocytopenia    Past Surgical History  Procedure Laterality Date  . Cataract extraction, bilateral    . Surgery for lazy eye      age 60  . Esophagogastroduodenoscopy N/A 04/16/2012    Procedure: ESOPHAGOGASTRODUODENOSCOPY (EGD);  Surgeon: Jeryl Columbia, MD;  Location: [facility];   Service: Endoscopy;  Laterality: N/A;  . Esophagogastroduodenoscopy N/A 04/17/2012    Procedure: ESOPHAGOGASTRODUODENOSCOPY (EGD);  Surgeon: Jeryl Columbia, MD;  Location: [facility];  Service: Endoscopy;  Laterality: N/A;  . Direct laryngoscopy N/A 07/13/2013    Procedure: DIRECT LARYNGOSCOPY WITH EXCISION TUMOR;  Surgeon: Rozetta Nunnery, MD;  Location: [place];  Service: ENT;  Laterality: N/A;  . Hip pinning,cannulated Left 12/06/2013    Procedure: CANNULATED HIP PINNING;  Surgeon: Renette Butters, MD;  Location: [place];  Service: Orthopedics;  Laterality: Left;  . Tee without cardioversion N/A 12/13/2013    Procedure: TRANSESOPHAGEAL ECHOCARDIOGRAM (TEE);  Surgeon: Larey Dresser, MD;  Location: [facility];  Service: Cardiovascular;  Laterality: N/A;   Social History:  reports that he has been smoking Cigarettes.  He has been smoking about 1.00 pack per day. He has never used smokeless tobacco. He reports that he does not drink alcohol or use illicit drugs. Home;  where does patient live--home, ALF, SNF? and with whom if at home? Yes;  Can patient participate in ADLs?  Allergies  Allergen Reactions  . Pulmicort [Budesonide] Shortness Of Breath    Causes stridor  . Penicillins Other (See Comments)    Rash hives, white spots.    Family History  Problem Relation Age of Onset  . Heart attack Mother   . Cirrhosis Sister     (be sure to complete)  Prior to Admission medications   Medication Sig Start Date End Date Taking? Authorizing Provider  diphenhydrAMINE (BENADRYL) 25 mg capsule Take 1 capsule (25 mg total) by mouth every 12 (twelve)  hours as needed for itching. Patient taking differently: Take 25 mg by mouth at bedtime.  11/09/13  Yes Barton Dubois, MD  furosemide (LASIX) 80 MG tablet Take one tablet in the am and 1/2 tablet in the PM Patient taking differently: Take 80 mg by mouth 2 (two) times daily.  03/21/14  Yes Charlynne Cousins, MD  lactose free  nutrition (BOOST) LIQD Take 237 mLs by mouth daily as needed (Unable to eat).   Yes Historical Provider, MD  lactulose (CHRONULAC) 10 GM/15ML solution Take 30 mLs (20 g total) by mouth daily. 03/21/14  Yes Charlynne Cousins, MD  lidocaine (XYLOCAINE) 2 % solution Use as directed 10 mLs in the mouth or throat every 4 (four) hours as needed for mouth pain. 03/21/14  Yes Charlynne Cousins, MD  Magnesium 250 MG TABS Take 250 mg by mouth daily.   Yes Historical Provider, MD  Multiple Vitamin (MULTIVITAMIN WITH MINERALS) TABS tablet Take 1 tablet by mouth daily.   Yes Historical Provider, MD  oxyCODONE (OXY IR/ROXICODONE) 5 MG immediate release tablet Take 1 tablet (5 mg total) by mouth every 6 (six) hours as needed for moderate pain. 03/21/14  Yes Charlynne Cousins, MD  pantoprazole (PROTONIX) 40 MG tablet Take 1 tablet (40 mg total) by mouth daily. Patient taking differently: Take 40 mg by mouth 2 (two) times daily.  11/09/13  Yes Barton Dubois, MD  Polysaccharide Iron Complex (POLY-IRON 150 PO) Take 1 capsule by mouth daily.   Yes Historical Provider, MD  rifaximin (XIFAXAN) 550 MG TABS tablet Take 1 tablet (550 mg total) by mouth 2 (two) times daily. 03/21/14  Yes Charlynne Cousins, MD  spironolactone (ALDACTONE) 100 MG tablet Take 100 mg by mouth daily.   Yes Historical Provider, MD  Vitamin D, Ergocalciferol, (DRISDOL) 50000 UNITS CAPS capsule Take 50,000 Units by mouth every 7 (seven) days.   Yes Historical Provider, MD  alendronate (FOSAMAX) 70 MG tablet Take 1 tablet (70 mg total) by mouth once a week. Hold until antibiotic therapy is completed Patient not taking: Reported on 04/07/2014 11/09/13   Barton Dubois, MD  docusate sodium (COLACE) 100 MG capsule Take 1 capsule (100 mg total) by mouth 2 (two) times daily. Patient not taking: Reported on 01/31/2014 12/06/13   Renette Butters, MD  doxycycline (VIBRA-TABS) 100 MG tablet Take 1 tablet (100 mg total) by mouth every 12 (twelve) hours. Patient  not taking: Reported on 04/07/2014 03/21/14   Charlynne Cousins, MD  feeding supplement, RESOURCE BREEZE, (RESOURCE BREEZE) LIQD Take 1 Container by mouth 3 (three) times daily between meals. Patient not taking: Reported on 12/05/2013 11/09/13   Barton Dubois, MD  ferrous sulfate 325 (65 FE) MG tablet Take 1 tablet (325 mg total) by mouth 2 (two) times daily with a meal. Patient not taking: Reported on 03/16/2014 11/09/13   Barton Dubois, MD  temazepam (RESTORIL) 7.5 MG capsule Take 1 capsule (7.5 mg total) by mouth at bedtime as needed for sleep. Patient not taking: Reported on 03/16/2014 11/09/13   Blanchie Serve, MD   Physical Exam: Filed Vitals:   04/14/14 1030  BP:   Pulse:   Temp: 97.6 F (36.4 C)  Resp:      General:  Alert, no distress   Eyes: eom-i  ENT: post op oral dressing; no s/s of acute lbeeding   Neck: supple, no JVD  Cardiovascular: s1,s2 rrr  Respiratory: CTA BL  Abdomen: softy, nt, mild distended   Skin: ecchymosis, spider  angiomata   Musculoskeletal: BL leg edema  Psychiatric: no hallucinations   Neurologic: CN 2-12 intact, alert, oriented   Labs on Admission:  Basic Metabolic Panel: No results for input(s): NA, K, CL, CO2, GLUCOSE, BUN, CREATININE, CALCIUM, MG, PHOS in the last 168 hours. Liver Function Tests: No results for input(s): AST, ALT, ALKPHOS, BILITOT, PROT, ALBUMIN in the last 168 hours. No results for input(s): LIPASE, AMYLASE in the last 168 hours. No results for input(s): AMMONIA in the last 168 hours. CBC:  Recent Labs Lab 04/14/14 0615  WBC 5.4  HGB 8.3*  HCT 24.5*  MCV 92.5  PLT 61*   Cardiac Enzymes: No results for input(s): CKTOTAL, CKMB, CKMBINDEX, TROPONINI in the last 168 hours.  BNP (last 3 results) No results for input(s): BNP in the last 8760 hours.  ProBNP (last 3 results)  Recent Labs  11/15/13 1237  PROBNP 864.3*    CBG: No results for input(s): GLUCAP in the last 168 hours.  Radiological Exams on  Admission: No results found.  EKG: Independently reviewed.   Assessment/Plan Active Problems:   Cirrhosis, non-alcoholic   69 y.o. male with PMH of NASH cirrhosis, h/o GIB, CKD, chronic thrombocytopenia, h/o hepatic encephalopathy presented for multiple dental extractions with alveoloplasty. post procedure, hospitalist asked for overnight observation due to risk of bleeding. Patient was transfused 2 units FFP, 6 packs of platelets periop to prevent bleeding complications.   1. S/p dental extraction; Multiple extraction of tooth numbers 3, 4, 5, 6, 7, 8, 9, 10, 11, 15, 21, 22, 23, 24, 25, 26, 27, and 28.; 4 Quadrants of alveoloplasty -s/p FFP 2 units, platelets 6 packs; current Hg-8.3 (above his baseline); platelets 61k; no s/s of acute bleeding;  -diet per dentist; will recheck CBC in AM; monitor s/s for bleeding   2.  NASH cirrhosis, coagulopathy, progressive liver failure, ascites;  -will resume home regimen if able to tolerate PO; if not will use  IV lasix for today    Dentist;  if consultant consulted, please document name and whether formally or informally consulted  Code Status: full (must indicate code status--if unknown or must be presumed, indicate so) Family Communication: d/w patient, his daughter  (indicate person spoken with, if applicable, with phone number if by telephone) Disposition Plan: home 24-48 hrs  (indicate anticipated LOS)  Time spent: >35 minutes   Kinnie Feil [facility] Pager [phone]  If 7PM-7AM, please contact night-coverage [email or web address] Password [facility] 04/14/2014, 10:57 AM

## 2014-04-15 ENCOUNTER — Encounter (HOSPITAL_COMMUNITY): Payer: Self-pay | Admitting: Internal Medicine

## 2014-04-15 DIAGNOSIS — D649 Anemia, unspecified: Secondary | ICD-10-CM | POA: Diagnosis not present

## 2014-04-15 DIAGNOSIS — K029 Dental caries, unspecified: Secondary | ICD-10-CM

## 2014-04-15 DIAGNOSIS — K7460 Unspecified cirrhosis of liver: Secondary | ICD-10-CM | POA: Diagnosis not present

## 2014-04-15 DIAGNOSIS — K045 Chronic apical periodontitis: Secondary | ICD-10-CM

## 2014-04-15 DIAGNOSIS — K088 Other specified disorders of teeth and supporting structures: Secondary | ICD-10-CM

## 2014-04-15 DIAGNOSIS — D689 Coagulation defect, unspecified: Secondary | ICD-10-CM

## 2014-04-15 DIAGNOSIS — K089 Disorder of teeth and supporting structures, unspecified: Secondary | ICD-10-CM | POA: Diagnosis present

## 2014-04-15 DIAGNOSIS — K7581 Nonalcoholic steatohepatitis (NASH): Secondary | ICD-10-CM

## 2014-04-15 LAB — PREPARE PLATELET PHERESIS: UNIT DIVISION: 0

## 2014-04-15 LAB — CBC
HCT: 21.7 % — ABNORMAL LOW (ref 39.0–52.0)
HEMOGLOBIN: 7.4 g/dL — AB (ref 13.0–17.0)
MCH: 31.8 pg (ref 26.0–34.0)
MCHC: 34.1 g/dL (ref 30.0–36.0)
MCV: 93.1 fL (ref 78.0–100.0)
Platelets: 81 10*3/uL — ABNORMAL LOW (ref 150–400)
RBC: 2.33 MIL/uL — ABNORMAL LOW (ref 4.22–5.81)
RDW: 18.5 % — ABNORMAL HIGH (ref 11.5–15.5)
WBC: 8.4 10*3/uL (ref 4.0–10.5)

## 2014-04-15 LAB — PREPARE FRESH FROZEN PLASMA
UNIT DIVISION: 0
UNIT DIVISION: 0

## 2014-04-15 MED ORDER — SODIUM CHLORIDE 0.9 % IR SOLN: 200.0000 mL | Status: DC | PRN

## 2014-04-15 MED ORDER — AMINOCAPROIC ACID SOLUTION 5% (50 MG/ML)
10.0000 mL | ORAL | Status: DC
  Administered 2014-04-15 (×5): 10 mL via ORAL
  Filled 2014-04-15 (×3): qty 100

## 2014-04-15 MED ORDER — SODIUM CHLORIDE 0.9 % IR SOLN
200.0000 mL | Status: DC
  Administered 2014-04-15 (×2): 200 mL

## 2014-04-15 NOTE — Discharge Summary (Signed)
Physician Discharge Summary  Clifford Cook IWP:[id number] DOB: 04-06-1945 DOA: 04/14/2014  PCP: [name], MD  Admit date: 04/14/2014 Discharge date: 04/15/2014   Recommendations for Outpatient Follow-Up:   1. Return to dental clinic in 7-10 days for evaluation for suture removal. 2. Recommend repeat CBC in one week.   Discharge Diagnosis:   Principal Problem:    Poor dentition status post alveoloplasty with risk of postoperative bleeding Active Problems:    Liver cirrhosis secondary to NASH    Anemia    Thrombocytopenia    Coagulopathy   Discharge Condition: Improved.  Diet recommendation: Low sodium, heart healthy.     History of Present Illness:   Patient is a 69 year old male with a past medical history of nonalcoholic cirrhosis and chronic anemia, thrombocytopenia and coagulopathy who underwent alveoloplasty with dental extractions 04/14/14 and was admitted for observation due to high bleeding risk.   Hospital Course by Problem:   Principal Problem:   Poor dentition status post alveoloplasty with risk of postoperative bleeding - No evidence of significant bleeding. - Seen by dental medicine and cleared for discharge. - Gentle salt water rinses every 2 hours. - Return to dental clinic in 7-10 days for suture removal.  Active Problems:   Liver cirrhosis secondary to NASH - Resume preadmission medications.    Anemia - 1 g drop in hemoglobin likely from operative losses and dilutional factors. No evidence of ongoing bleeding.    Thrombocytopenia - Patient received platelets prior to his dental procedure.    Coagulopathy - The patient received fresh frozen plasma prior to his procedure.   Medical Consultants:    Dental medicine: Lenn Cal, DDS   Discharge Exam:   Filed Vitals:   04/15/14 0556  BP: 116/94  Pulse: 96  Temp: 98.8 F (37.1 C)  Resp: 18   Filed Vitals:   04/14/14 1734 04/14/14 2147 04/15/14 0245 04/15/14 0556  BP:  108/60 110/60 108/74 116/94  Pulse: 95 98 95 96  Temp: 98.5 F (36.9 C) 98.5 F (36.9 C) 98.5 F (36.9 C) 98.8 F (37.1 C)  TempSrc: Axillary Oral Axillary Axillary  Resp: 16 18 18 18   Height:      Weight:      SpO2: 99% 98% 98% 98%    Gen:  NAD Cardiovascular:  RRR, No M/R/G Respiratory: Lungs CTAB Gastrointestinal: Abdomen soft, slightly distended with normal active bowel sounds. Extremities: No C/E/C   The results of significant diagnostics from this hospitalization (including imaging, microbiology, ancillary and laboratory) are listed below for reference.     Procedures and Diagnostic Studies:   OPERATIONS: 1. Multiple extraction of tooth numbers 3, 4, 5, 6, 7, 8, 9, 10, 11, 15, 21, 22, 23, 24, 25, 26, 27, and 28. 2. 4 Quadrants of alveoloplasty  Labs:   CBC:  Recent Labs Lab 04/14/14 0615 04/15/14 0518  WBC 5.4 8.4  HGB 8.3* 7.4*  HCT 24.5* 21.7*  MCV 92.5 93.1  PLT 61* 81*     Discharge Instructions:       Discharge Instructions    Call MD for:  extreme fatigue    Complete by:  As directed      Call MD for:  severe uncontrolled pain    Complete by:  As directed      Call MD for:  temperature >100.4    Complete by:  As directed      Consult to dietitian    Complete by:  As directed   Patient  is edentulous.     Diet - low sodium heart healthy    Complete by:  As directed      Discharge instructions    Complete by:  As directed   You were cared for by Dr. Jacquelynn Cree  (a hospitalist) during your hospital stay. If you have any questions about your discharge medications or the care you received while you were in the hospital after you are discharged, you can call the unit and ask to speak with the hospitalist on call if the hospitalist that took care of you is not available. Once you are discharged, your primary care physician will handle any further medical issues. Please note that NO REFILLS for any discharge medications will be authorized once you  are discharged, as it is imperative that you return to your primary care physician (or establish a relationship with a primary care physician if you do not have one) for your aftercare needs so that they can reassess your need for medications and monitor your lab values.  Any outstanding tests can be reviewed by your PCP at your follow up visit.  It is also important to review any medicine changes with your PCP.  Please bring these d/c instructions with you to your next visit so your physician can review these changes with you.  If you do not have a primary care physician, you can call [phone] for a physician referral.  It is highly recommended that you obtain a PCP for hospital follow up.     Gauze    Complete by:  As directed   4 x 4 gauze to oral bleeding sites until oozing stops.     Increase activity slowly    Complete by:  As directed             Medication List    STOP taking these medications        alendronate 70 MG tablet  Commonly known as:  FOSAMAX     doxycycline 100 MG tablet  Commonly known as:  VIBRA-TABS     ferrous sulfate 325 (65 FE) MG tablet     lidocaine 2 % solution  Commonly known as:  XYLOCAINE      TAKE these medications        diphenhydrAMINE 25 mg capsule  Commonly known as:  BENADRYL  Take 1 capsule (25 mg total) by mouth every 12 (twelve) hours as needed for itching.     docusate sodium 100 MG capsule  Commonly known as:  COLACE  Take 1 capsule (100 mg total) by mouth 2 (two) times daily.     lactose free nutrition Liqd  Take 237 mLs by mouth daily as needed (Unable to eat).     feeding supplement (RESOURCE BREEZE) Liqd  Take 1 Container by mouth 3 (three) times daily between meals.     furosemide 80 MG tablet  Commonly known as:  LASIX  Take one tablet in the am and 1/2 tablet in the PM     lactulose 10 GM/15ML solution  Commonly known as:  CHRONULAC  Take 30 mLs (20 g total) by mouth daily.     Magnesium 250 MG Tabs  Take 250 mg  by mouth daily.     multivitamin with minerals Tabs tablet  Take 1 tablet by mouth daily.     oxyCODONE 5 MG immediate release tablet  Commonly known as:  Oxy IR/ROXICODONE  Take 1 tablet (5 mg total) by mouth every 6 (six) hours  as needed for moderate pain.     pantoprazole 40 MG tablet  Commonly known as:  PROTONIX  Take 1 tablet (40 mg total) by mouth daily.     POLY-IRON 150 PO  Take 1 capsule by mouth daily.     rifaximin 550 MG Tabs tablet  Commonly known as:  XIFAXAN  Take 1 tablet (550 mg total) by mouth 2 (two) times daily.     spironolactone 100 MG tablet  Commonly known as:  ALDACTONE  Take 100 mg by mouth daily.     temazepam 7.5 MG capsule  Commonly known as:  RESTORIL  Take 1 capsule (7.5 mg total) by mouth at bedtime as needed for sleep.     Vitamin D (Ergocalciferol) 50000 UNITS Caps capsule  Commonly known as:  DRISDOL  Take 50,000 Units by mouth every 7 (seven) days.       Follow-up Information    Follow up with Lenn Cal, DDS. Schedule an appointment as soon as possible for a visit on 04/25/2014.   Specialty:  Dentistry   Why:  For suture removal, For wound re-check   Contact information:   [place] [address] [phone]       Follow up with [name], MD.   Specialty:  Family Medicine   Contact information:   [place] [phone]        Time coordinating discharge: 25 minutes.  Signed:  RAMA,CHRISTINA  Pager [phone] [facility] 04/15/2014, 2:29 PM

## 2014-04-15 NOTE — Progress Notes (Signed)
POST OPERATIVE NOTE: Post op day #1   04/15/2014   Clifford Cook [id number]  VITALS: BP 116/94 mmHg  Pulse 96  Temp(Src) 98.8 F (37.1 C) (Axillary)  Resp 18  Ht 5\' 9"  (1.753 m)  Wt 194 lb 12 oz (88.338 kg)  BMI 28.75 kg/m2  SpO2 98%  LABS:  Lab Results  Component Value Date   WBC 8.4 04/15/2014   HGB 7.4* 04/15/2014   HCT 21.7* 04/15/2014   MCV 93.1 04/15/2014   PLT 81* 04/15/2014   BMET    Component Value Date/Time   NA 129* 04/07/2014 0845   K 4.1 04/07/2014 0845   CL 94* 04/07/2014 0845   CO2 26 04/07/2014 0845   GLUCOSE 107* 04/07/2014 0845   BUN 12 04/07/2014 0845   CREATININE 1.01 04/07/2014 0845   CALCIUM 8.2* 04/07/2014 0845   GFRNONAA 74* 04/07/2014 0845   GFRAA 86* 04/07/2014 0845    Lab Results  Component Value Date   INR 1.48 04/07/2014   INR 1.78* 03/20/2014   INR 1.66* 12/14/2013   No results found for: PTT   Clifford Cook is status post multiple extractions with alveoloplasty in the operating room with general anesthesia. The patient was admitted for overnight observation.  SUBJECTIVE: Patient denies having any significant discomfort. " I don't have any pain". Patient denies having any significant oozing from his extraction sites.  EXAM: There is no sign of infection, heme, or ooze. Sutures are intact. Clots are present. Some intraoral swelling and bruising is noted. Minimal extraoral swelling and ecchymoses are noted.  ASSESSMENT: Post operative course is consistent with dental procedures performed in the OR. The patient is now edentulous.  PLAN: 1. Start gentle salt water rinses every 2 hours while awake this morning. 2. Pain medication as needed. 3. Advance diet as tolerated. 4. Patient okay for discharge from a dental standpoint. 5. Return to clinic in 7-10 days for evaluation for suture removal.   Lenn Cal, DDS

## 2014-04-15 NOTE — Progress Notes (Signed)
UR completed 

## 2014-04-25 ENCOUNTER — Emergency Department (HOSPITAL_COMMUNITY)
Admission: EM | Admit: 2014-04-25 | Discharge: 2014-04-25 | Disposition: A | Discharge status: Home / Self Care | Payer: Medicare Other | Source: Home / Self Care | Attending: Emergency Medicine | Admitting: Emergency Medicine

## 2014-04-25 ENCOUNTER — Encounter (HOSPITAL_COMMUNITY): Payer: Self-pay

## 2014-04-25 DIAGNOSIS — N183 Chronic kidney disease, stage 3 (moderate): Secondary | ICD-10-CM | POA: Diagnosis present

## 2014-04-25 DIAGNOSIS — Z515 Encounter for palliative care: Secondary | ICD-10-CM

## 2014-04-25 DIAGNOSIS — Z79899 Other long term (current) drug therapy: Secondary | ICD-10-CM

## 2014-04-25 DIAGNOSIS — D6959 Other secondary thrombocytopenia: Secondary | ICD-10-CM | POA: Diagnosis present

## 2014-04-25 DIAGNOSIS — J9601 Acute respiratory failure with hypoxia: Secondary | ICD-10-CM | POA: Diagnosis not present

## 2014-04-25 DIAGNOSIS — Y95 Nosocomial condition: Secondary | ICD-10-CM | POA: Diagnosis present

## 2014-04-25 DIAGNOSIS — M79605 Pain in left leg: Secondary | ICD-10-CM

## 2014-04-25 DIAGNOSIS — K7460 Unspecified cirrhosis of liver: Secondary | ICD-10-CM

## 2014-04-25 DIAGNOSIS — Z8619 Personal history of other infectious and parasitic diseases: Secondary | ICD-10-CM | POA: Insufficient documentation

## 2014-04-25 DIAGNOSIS — R51 Headache: Secondary | ICD-10-CM

## 2014-04-25 DIAGNOSIS — K7581 Nonalcoholic steatohepatitis (NASH): Secondary | ICD-10-CM

## 2014-04-25 DIAGNOSIS — N189 Chronic kidney disease, unspecified: Secondary | ICD-10-CM | POA: Insufficient documentation

## 2014-04-25 DIAGNOSIS — D689 Coagulation defect, unspecified: Secondary | ICD-10-CM | POA: Diagnosis present

## 2014-04-25 DIAGNOSIS — Z720 Tobacco use: Secondary | ICD-10-CM

## 2014-04-25 DIAGNOSIS — E871 Hypo-osmolality and hyponatremia: Secondary | ICD-10-CM | POA: Diagnosis present

## 2014-04-25 DIAGNOSIS — E860 Dehydration: Secondary | ICD-10-CM | POA: Insufficient documentation

## 2014-04-25 DIAGNOSIS — R739 Hyperglycemia, unspecified: Secondary | ICD-10-CM | POA: Diagnosis present

## 2014-04-25 DIAGNOSIS — R319 Hematuria, unspecified: Secondary | ICD-10-CM | POA: Diagnosis present

## 2014-04-25 DIAGNOSIS — Z8701 Personal history of pneumonia (recurrent): Secondary | ICD-10-CM

## 2014-04-25 DIAGNOSIS — Z8679 Personal history of other diseases of the circulatory system: Secondary | ICD-10-CM | POA: Insufficient documentation

## 2014-04-25 DIAGNOSIS — Z6828 Body mass index (BMI) 28.0-28.9, adult: Secondary | ICD-10-CM

## 2014-04-25 DIAGNOSIS — E872 Acidosis: Secondary | ICD-10-CM | POA: Diagnosis present

## 2014-04-25 DIAGNOSIS — Z8249 Family history of ischemic heart disease and other diseases of the circulatory system: Secondary | ICD-10-CM

## 2014-04-25 DIAGNOSIS — Z888 Allergy status to other drugs, medicaments and biological substances status: Secondary | ICD-10-CM

## 2014-04-25 DIAGNOSIS — R52 Pain, unspecified: Secondary | ICD-10-CM

## 2014-04-25 DIAGNOSIS — Z862 Personal history of diseases of the blood and blood-forming organs and certain disorders involving the immune mechanism: Secondary | ICD-10-CM

## 2014-04-25 DIAGNOSIS — F17210 Nicotine dependence, cigarettes, uncomplicated: Secondary | ICD-10-CM | POA: Diagnosis present

## 2014-04-25 DIAGNOSIS — E43 Unspecified severe protein-calorie malnutrition: Secondary | ICD-10-CM | POA: Diagnosis present

## 2014-04-25 DIAGNOSIS — G8921 Chronic pain due to trauma: Secondary | ICD-10-CM | POA: Diagnosis present

## 2014-04-25 DIAGNOSIS — K565 Intestinal adhesions [bands] with obstruction (postprocedural) (postinfection): Secondary | ICD-10-CM | POA: Diagnosis present

## 2014-04-25 DIAGNOSIS — Z8711 Personal history of peptic ulcer disease: Secondary | ICD-10-CM

## 2014-04-25 DIAGNOSIS — A419 Sepsis, unspecified organism: Principal | ICD-10-CM | POA: Diagnosis present

## 2014-04-25 DIAGNOSIS — K567 Ileus, unspecified: Secondary | ICD-10-CM | POA: Diagnosis present

## 2014-04-25 DIAGNOSIS — Z66 Do not resuscitate: Secondary | ICD-10-CM | POA: Diagnosis present

## 2014-04-25 DIAGNOSIS — E7220 Disorder of urea cycle metabolism, unspecified: Secondary | ICD-10-CM | POA: Diagnosis present

## 2014-04-25 DIAGNOSIS — Z8639 Personal history of other endocrine, nutritional and metabolic disease: Secondary | ICD-10-CM

## 2014-04-25 DIAGNOSIS — D638 Anemia in other chronic diseases classified elsewhere: Secondary | ICD-10-CM | POA: Diagnosis present

## 2014-04-25 DIAGNOSIS — M4850XS Collapsed vertebra, not elsewhere classified, site unspecified, sequela of fracture: Secondary | ICD-10-CM | POA: Diagnosis present

## 2014-04-25 DIAGNOSIS — Z791 Long term (current) use of non-steroidal anti-inflammatories (NSAID): Secondary | ICD-10-CM

## 2014-04-25 DIAGNOSIS — K6389 Other specified diseases of intestine: Secondary | ICD-10-CM | POA: Diagnosis present

## 2014-04-25 DIAGNOSIS — Z880 Allergy status to penicillin: Secondary | ICD-10-CM

## 2014-04-25 DIAGNOSIS — I4891 Unspecified atrial fibrillation: Secondary | ICD-10-CM | POA: Diagnosis present

## 2014-04-25 DIAGNOSIS — J189 Pneumonia, unspecified organism: Secondary | ICD-10-CM | POA: Diagnosis not present

## 2014-04-25 DIAGNOSIS — J811 Chronic pulmonary edema: Secondary | ICD-10-CM | POA: Diagnosis present

## 2014-04-25 DIAGNOSIS — M545 Low back pain: Secondary | ICD-10-CM | POA: Diagnosis present

## 2014-04-25 DIAGNOSIS — R6521 Severe sepsis with septic shock: Secondary | ICD-10-CM | POA: Diagnosis present

## 2014-04-25 DIAGNOSIS — G9340 Encephalopathy, unspecified: Secondary | ICD-10-CM | POA: Diagnosis present

## 2014-04-25 DIAGNOSIS — J449 Chronic obstructive pulmonary disease, unspecified: Secondary | ICD-10-CM | POA: Diagnosis present

## 2014-04-25 DIAGNOSIS — Z79891 Long term (current) use of opiate analgesic: Secondary | ICD-10-CM

## 2014-04-25 DIAGNOSIS — M4856XA Collapsed vertebra, not elsewhere classified, lumbar region, initial encounter for fracture: Secondary | ICD-10-CM | POA: Diagnosis present

## 2014-04-25 DIAGNOSIS — I9581 Postprocedural hypotension: Secondary | ICD-10-CM | POA: Diagnosis not present

## 2014-04-25 DIAGNOSIS — J9811 Atelectasis: Secondary | ICD-10-CM | POA: Diagnosis not present

## 2014-04-25 DIAGNOSIS — N179 Acute kidney failure, unspecified: Secondary | ICD-10-CM | POA: Diagnosis not present

## 2014-04-25 DIAGNOSIS — K7031 Alcoholic cirrhosis of liver with ascites: Secondary | ICD-10-CM | POA: Diagnosis present

## 2014-04-25 DIAGNOSIS — M79604 Pain in right leg: Secondary | ICD-10-CM | POA: Insufficient documentation

## 2014-04-25 LAB — COMPREHENSIVE METABOLIC PANEL
ALK PHOS: 148 U/L — AB (ref 39–117)
ALT: 24 U/L (ref 0–53)
AST: 36 U/L (ref 0–37)
Albumin: 2.3 g/dL — ABNORMAL LOW (ref 3.5–5.2)
Anion gap: 10 (ref 5–15)
BUN: 15 mg/dL (ref 6–23)
CHLORIDE: 96 mmol/L (ref 96–112)
CO2: 24 mmol/L (ref 19–32)
Calcium: 7.6 mg/dL — ABNORMAL LOW (ref 8.4–10.5)
Creatinine, Ser: 1.05 mg/dL (ref 0.50–1.35)
GFR, EST AFRICAN AMERICAN: 82 mL/min — AB (ref >90)
GFR, EST NON AFRICAN AMERICAN: 70 mL/min — AB (ref >90)
GLUCOSE: 144 mg/dL — AB (ref 70–99)
Potassium: 3.8 mmol/L (ref 3.5–5.1)
SODIUM: 130 mmol/L — AB (ref 135–145)
Total Bilirubin: 3.1 mg/dL — ABNORMAL HIGH (ref 0.3–1.2)
Total Protein: 5.4 g/dL — ABNORMAL LOW (ref 6.0–8.3)

## 2014-04-25 LAB — URINALYSIS, ROUTINE W REFLEX MICROSCOPIC
Bilirubin Urine: NEGATIVE
GLUCOSE, UA: NEGATIVE mg/dL
Ketones, ur: NEGATIVE mg/dL
LEUKOCYTES UA: NEGATIVE
Nitrite: NEGATIVE
PROTEIN: NEGATIVE mg/dL
Specific Gravity, Urine: 1.013 (ref 1.005–1.030)
UROBILINOGEN UA: 1 mg/dL (ref 0.0–1.0)
pH: 6.5 (ref 5.0–8.0)

## 2014-04-25 LAB — CBC WITH DIFFERENTIAL/PLATELET
BASOS PCT: 1 % (ref 0–1)
Basophils Absolute: 0.1 10*3/uL (ref 0.0–0.1)
Eosinophils Absolute: 0.1 10*3/uL (ref 0.0–0.7)
Eosinophils Relative: 1 % (ref 0–5)
HCT: 22.6 % — ABNORMAL LOW (ref 39.0–52.0)
HEMOGLOBIN: 7.7 g/dL — AB (ref 13.0–17.0)
LYMPHS PCT: 7 % — AB (ref 12–46)
Lymphs Abs: 0.5 10*3/uL — ABNORMAL LOW (ref 0.7–4.0)
MCH: 32.2 pg (ref 26.0–34.0)
MCHC: 34.1 g/dL (ref 30.0–36.0)
MCV: 94.6 fL (ref 78.0–100.0)
MONOS PCT: 13 % — AB (ref 3–12)
Monocytes Absolute: 1 10*3/uL (ref 0.1–1.0)
Neutro Abs: 5.9 10*3/uL (ref 1.7–7.7)
Neutrophils Relative %: 78 % — ABNORMAL HIGH (ref 43–77)
Platelets: UNDETERMINED 10*3/uL (ref 150–400)
RBC: 2.39 MIL/uL — AB (ref 4.22–5.81)
RDW: 19.7 % — ABNORMAL HIGH (ref 11.5–15.5)
WBC: 7.6 10*3/uL (ref 4.0–10.5)

## 2014-04-25 LAB — URINE MICROSCOPIC-ADD ON

## 2014-04-25 LAB — LIPASE, BLOOD: Lipase: 101 U/L — ABNORMAL HIGH (ref 11–59)

## 2014-04-25 LAB — AMMONIA: Ammonia: 36 umol/L — ABNORMAL HIGH (ref 11–32)

## 2014-04-25 MED ORDER — SODIUM CHLORIDE 0.9 % IV BOLUS (SEPSIS)
500.0000 mL | Freq: Once | INTRAVENOUS | Status: AC
  Administered 2014-04-25: 500 mL via INTRAVENOUS

## 2014-04-25 MED ORDER — OXYCODONE HCL 5 MG PO TABS: 5.0000 mg | ORAL_TABLET | Freq: Once | ORAL | Status: DC

## 2014-04-25 MED ORDER — MORPHINE SULFATE 4 MG/ML IJ SOLN
4.0000 mg | Freq: Once | INTRAMUSCULAR | Status: AC
  Administered 2014-04-25: 4 mg via INTRAVENOUS
  Filled 2014-04-25: qty 1

## 2014-04-25 NOTE — ED Provider Notes (Signed)
CSN: [id number]     Arrival date & time 04/25/14  1131 History   First MD Initiated Contact with Patient 04/25/14 1240     Chief Complaint  Patient presents with  . Abdominal Pain  . Back Pain     (Consider location/radiation/quality/duration/timing/severity/associated sxs/prior Treatment) HPI 69 year old male presents to the emergency department from home with complaint of headache, low abdominal pain and bilateral leg pain.  Patient has history of liver failure , due to [facility], ascites, anemia.  Patient with history of pain normally controlled very well with a 5 mg dose of oxycodone in the evenings.  Failure reports he's been complaining of pain and fatigue over the last 2 days.  They were worried about hepatic encephalopathy and gave him an extra dose of lactulose last night.  Patient reports oxycodone not helping with pain.  No fevers or chills, no nausea or vomiting.  Patient has had adequate, stools with lactulose.  Family is concerned about possible dehydration due to use of Lasix.  Patient is otherwise eating and drinking well.  No abdominal distention or swelling, patient has chronic edema in the lower legs, unchanged from prior.  Patient reports he has headache going across his temples and forehead.  He does not normally have headache.  He denies any focal weakness or numbness.  No photo phonophobia.  Headache was gradual in onset. Past Medical History  Diagnosis Date  . Stomach ulcer   . GI bleed   . Liver disorder   . Cirrhosis   . Complication of anesthesia     slow to wake up with colonoscopy - 2013   . Dysrhythmia     afib, aflutter - during hospitalization and resolved no cardiac followup   . Pneumonia     septic pneumonia - 10/2013   . Chronic kidney disease     hx of stage III CKD   . Thrombocytopenia   . Pneumonia due to streptococcus, group A 10/31/2013  . Hyponatremia 10/31/2013  . Respiratory failure with hypoxia 10/31/2013  . Rib fracture 10/31/2013  . Hip  fracture 12/05/2013  . Atrial flutter 12/08/2013  . Encephalopathy, hepatic   . Abscess of hand    Past Surgical History  Procedure Laterality Date  . Cataract extraction, bilateral    . Surgery for lazy eye      age 38  . Esophagogastroduodenoscopy N/A 04/16/2012    Procedure: ESOPHAGOGASTRODUODENOSCOPY (EGD);  Surgeon: Jeryl Columbia, MD;  Location: [facility];  Service: Endoscopy;  Laterality: N/A;  . Esophagogastroduodenoscopy N/A 04/17/2012    Procedure: ESOPHAGOGASTRODUODENOSCOPY (EGD);  Surgeon: Jeryl Columbia, MD;  Location: [facility];  Service: Endoscopy;  Laterality: N/A;  . Direct laryngoscopy N/A 07/13/2013    Procedure: DIRECT LARYNGOSCOPY WITH EXCISION TUMOR;  Surgeon: Rozetta Nunnery, MD;  Location: [facility];  Service: ENT;  Laterality: N/A;  . Hip pinning,cannulated Left 12/06/2013    Procedure: CANNULATED HIP PINNING;  Surgeon: Renette Butters, MD;  Location: [facility];  Service: Orthopedics;  Laterality: Left;  . Tee without cardioversion N/A 12/13/2013    Procedure: TRANSESOPHAGEAL ECHOCARDIOGRAM (TEE);  Surgeon: Larey Dresser, MD;  Location: [facility];  Service: Cardiovascular;  Laterality: N/A;  . Multiple extractions with alveoloplasty N/A 04/14/2014    Procedure: Extraction of tooth #'s 3,4,5,6,7,8,9,10,11,15,21,22,23,24,25,26,27,28 WITH ALVEOLOPLASTY;  Surgeon: Lenn Cal, DDS;  Location: [facility];  Service: Oral Surgery;  Laterality: N/A;   Family History  Problem Relation Age of Onset  . Heart attack Mother   .  Cirrhosis Sister    History  Substance Use Topics  . Smoking status: Current Every Day Smoker -- 1.00 packs/day    Types: Cigarettes  . Smokeless tobacco: Never Used  . Alcohol Use: No    Review of Systems   See History of Present Illness; otherwise all other systems are reviewed and negative  Allergies  Pulmicort and Penicillins  Home Medications   Prior to Admission medications   Medication Sig Start Date End Date  Taking? Authorizing Provider  diphenhydrAMINE (BENADRYL) 25 mg capsule Take 1 capsule (25 mg total) by mouth every 12 (twelve) hours as needed for itching. Patient taking differently: Take 25 mg by mouth at bedtime.  11/09/13  Yes Barton Dubois, MD  furosemide (LASIX) 80 MG tablet Take one tablet in the am and 1/2 tablet in the PM Patient taking differently: Take 80 mg by mouth 2 (two) times daily.  03/21/14  Yes Charlynne Cousins, MD  lactose free nutrition (BOOST) LIQD Take 237 mLs by mouth daily as needed (Unable to eat).   Yes Historical Provider, MD  lactulose (CHRONULAC) 10 GM/15ML solution Take 30 mLs (20 g total) by mouth daily. 03/21/14  Yes Charlynne Cousins, MD  lidocaine (XYLOCAINE) 2 % solution Use as directed 10 mLs in the mouth or throat daily. 03/21/14  Yes Historical Provider, MD  Magnesium 250 MG TABS Take 250 mg by mouth daily.   Yes Historical Provider, MD  oxyCODONE (OXY IR/ROXICODONE) 5 MG immediate release tablet Take 1 tablet (5 mg total) by mouth every 6 (six) hours as needed for moderate pain. 03/21/14  Yes Charlynne Cousins, MD  pantoprazole (PROTONIX) 40 MG tablet Take 1 tablet (40 mg total) by mouth daily. Patient taking differently: Take 40 mg by mouth 2 (two) times daily.  11/09/13  Yes Barton Dubois, MD  Polysaccharide Iron Complex (POLY-IRON 150 PO) Take 1 capsule by mouth daily.   Yes Historical Provider, MD  rifaximin (XIFAXAN) 550 MG TABS tablet Take 1 tablet (550 mg total) by mouth 2 (two) times daily. 03/21/14  Yes Charlynne Cousins, MD  spironolactone (ALDACTONE) 100 MG tablet Take 100 mg by mouth daily.   Yes Historical Provider, MD  Vitamin D, Ergocalciferol, (DRISDOL) 50000 UNITS CAPS capsule Take 50,000 Units by mouth every 7 (seven) days.   Yes Historical Provider, MD  docusate sodium (COLACE) 100 MG capsule Take 1 capsule (100 mg total) by mouth 2 (two) times daily. Patient not taking: Reported on 01/31/2014 12/06/13   Renette Butters, MD  feeding  supplement, RESOURCE BREEZE, (RESOURCE BREEZE) LIQD Take 1 Container by mouth 3 (three) times daily between meals. Patient not taking: Reported on 12/05/2013 11/09/13   Barton Dubois, MD  temazepam (RESTORIL) 7.5 MG capsule Take 1 capsule (7.5 mg total) by mouth at bedtime as needed for sleep. Patient not taking: Reported on 03/16/2014 11/09/13   Blanchie Serve, MD   BP 95/50 mmHg  Pulse 83  Temp(Src) 98.1 F (36.7 C) (Oral)  Resp 20  SpO2 95% Physical Exam  Constitutional: He is oriented to person, place, and time. He appears well-developed and well-nourished.  Chronically ill-appearing male, thin, with large abdomen and swollen legs.  HENT:  Head: Normocephalic and atraumatic.  Right Ear: External ear normal.  Left Ear: External ear normal.  Nose: Nose normal.  Dry mucous membranes  Eyes: Conjunctivae and EOM are normal. Pupils are equal, round, and reactive to light.  Neck: Normal range of motion. Neck supple. No JVD present. No  tracheal deviation present. No thyromegaly present.  Cardiovascular: Normal rate, regular rhythm, normal heart sounds and intact distal pulses.  Exam reveals no gallop and no friction rub.   No murmur heard. Pulmonary/Chest: Effort normal and breath sounds normal. No stridor. No respiratory distress. He has no wheezes. He has no rales. He exhibits no tenderness.  Abdominal: Soft. Bowel sounds are normal. He exhibits distension. He exhibits no mass.  Ascites noted  Musculoskeletal: Normal range of motion. He exhibits edema. He exhibits no tenderness.  Lymphadenopathy:    He has no cervical adenopathy.  Neurological: He is alert and oriented to person, place, and time. He displays normal reflexes. No cranial nerve deficit. He exhibits normal muscle tone. Coordination normal.  Skin: Skin is warm and dry. No rash noted. No erythema. No pallor.  Psychiatric: He has a normal mood and affect. His behavior is normal. Judgment and thought content normal.  Nursing note  and vitals reviewed.   ED Course  Procedures (including critical care time) Labs Review Labs Reviewed  CBC WITH DIFFERENTIAL/PLATELET - Abnormal; Notable for the following:    RBC 2.39 (*)    Hemoglobin 7.7 (*)    HCT 22.6 (*)    RDW 19.7 (*)    Neutrophils Relative % 78 (*)    Lymphocytes Relative 7 (*)    Monocytes Relative 13 (*)    Lymphs Abs 0.5 (*)    All other components within normal limits  COMPREHENSIVE METABOLIC PANEL - Abnormal; Notable for the following:    Sodium 130 (*)    Glucose, Bld 144 (*)    Calcium 7.6 (*)    Total Protein 5.4 (*)    Albumin 2.3 (*)    Alkaline Phosphatase 148 (*)    Total Bilirubin 3.1 (*)    GFR calc non Af Amer 70 (*)    GFR calc Af Amer 82 (*)    All other components within normal limits  LIPASE, BLOOD - Abnormal; Notable for the following:    Lipase 101 (*)    All other components within normal limits  URINALYSIS, ROUTINE W REFLEX MICROSCOPIC - Abnormal; Notable for the following:    Color, Urine AMBER (*)    Hgb urine dipstick MODERATE (*)    All other components within normal limits  AMMONIA - Abnormal; Notable for the following:    Ammonia 36 (*)    All other components within normal limits  URINE MICROSCOPIC-ADD ON    Imaging Review No results found.   EKG Interpretation None      MDM   Final diagnoses:  Dehydration  Liver cirrhosis secondary to NASH  Pain    69 year old male with pain in abdomen, back, legs and head.  Plan for labs, urinalysis.  Pain control.  3:01 PM Patient reports pain is well controlled.  Dehydration on exam, no other significant findings.  We'll give 500 cc fluid bolus and patient will follow up with primary care this week.  Linton Flemings, MD 04/25/14 1501

## 2014-04-25 NOTE — Discharge Instructions (Signed)
Continue your current pain medication.  Follow-up with your Dr. for recheck this week.  Return to the emergency department for worsening condition or new concerning symptoms.    Dehydration, Adult Dehydration is when you lose more fluids from the body than you take in. Vital organs like the kidneys, brain, and heart cannot function without a proper amount of fluids and salt. Any loss of fluids from the body can cause dehydration.  CAUSES   Vomiting.  Diarrhea.  Excessive sweating.  Excessive urine output.  Fever. SYMPTOMS  Mild dehydration  Thirst.  Dry lips.  Slightly dry mouth. Moderate dehydration  Very dry mouth.  Sunken eyes.  Skin does not bounce back quickly when lightly pinched and released.  Dark urine and decreased urine production.  Decreased tear production.  Headache. Severe dehydration  Very dry mouth.  Extreme thirst.  Rapid, weak pulse (more than 100 beats per minute at rest).  Cold hands and feet.  Not able to sweat in spite of heat and temperature.  Rapid breathing.  Blue lips.  Confusion and lethargy.  Difficulty being awakened.  Minimal urine production.  No tears. DIAGNOSIS  Your caregiver will diagnose dehydration based on your symptoms and your exam. Blood and urine tests will help confirm the diagnosis. The diagnostic evaluation should also identify the cause of dehydration. TREATMENT  Treatment of mild or moderate dehydration can often be done at home by increasing the amount of fluids that you drink. It is best to drink small amounts of fluid more often. Drinking too much at one time can make vomiting worse. Refer to the home care instructions below. Severe dehydration needs to be treated at the hospital where you will probably be given intravenous (IV) fluids that contain water and electrolytes. HOME CARE INSTRUCTIONS   Ask your caregiver about specific rehydration instructions.  Drink enough fluids to keep your urine  clear or pale yellow.  Drink small amounts frequently if you have nausea and vomiting.  Eat as you normally do.  Avoid:  Foods or drinks high in sugar.  Carbonated drinks.  Juice.  Extremely hot or cold fluids.  Drinks with caffeine.  Fatty, greasy foods.  Alcohol.  Tobacco.  Overeating.  Gelatin desserts.  Wash your hands well to avoid spreading bacteria and viruses.  Only take over-the-counter or prescription medicines for pain, discomfort, or fever as directed by your caregiver.  Ask your caregiver if you should continue all prescribed and over-the-counter medicines.  Keep all follow-up appointments with your caregiver. SEEK MEDICAL CARE IF:  You have abdominal pain and it increases or stays in one area (localizes).  You have a rash, stiff neck, or severe headache.  You are irritable, sleepy, or difficult to awaken.  You are weak, dizzy, or extremely thirsty. SEEK IMMEDIATE MEDICAL CARE IF:   You are unable to keep fluids down or you get worse despite treatment.  You have frequent episodes of vomiting or diarrhea.  You have blood or green matter (bile) in your vomit.  You have blood in your stool or your stool looks black and tarry.  You have not urinated in 6 to 8 hours, or you have only urinated a small amount of very dark urine.  You have a fever.  You faint. MAKE SURE YOU:   Understand these instructions.  Will watch your condition.  Will get help right away if you are not doing well or get worse. Document Released: 12/31/2004 Document Revised: 03/25/2011 Document Reviewed: 08/20/2010 [employer] Patient Information 2015  [employer]. This information is not intended to replace advice given to you by your health care provider. Make sure you discuss any questions you have with your health care provider.  Pain of Unknown Etiology (Pain Without a Known Cause) You have come to your caregiver because of pain. Pain can occur in any part of the  body. Often there is not a definite cause. If your laboratory (blood or urine) work was normal and X-rays or other studies were normal, your caregiver may treat you without knowing the cause of the pain. An example of this is the headache. Most headaches are diagnosed by taking a history. This means your caregiver asks you questions about your headaches. Your caregiver determines a treatment based on your answers. Usually testing done for headaches is normal. Often testing is not done unless there is no response to medications. Regardless of where your pain is located today, you can be given medications to make you comfortable. If no physical cause of pain can be found, most cases of pain will gradually leave as suddenly as they came.  If you have a painful condition and no reason can be found for the pain, it is important that you follow up with your caregiver. If the pain becomes worse or does not go away, it may be necessary to repeat tests and look further for a possible cause.  Only take over-the-counter or prescription medicines for pain, discomfort, or fever as directed by your caregiver.  For the protection of your privacy, test results cannot be given over the phone. Make sure you receive the results of your test. Ask how these results are to be obtained if you have not been informed. It is your responsibility to obtain your test results.  You may continue all activities unless the activities cause more pain. When the pain lessens, it is important to gradually resume normal activities. Resume activities by beginning slowly and gradually increasing the intensity and duration of the activities or exercise. During periods of severe pain, bed rest may be helpful. Lie or sit in any position that is comfortable.  Ice used for acute (sudden) conditions may be effective. Use a large plastic bag filled with ice and wrapped in a towel. This may provide pain relief.  See your caregiver for continued  problems. Your caregiver can help or refer you for exercises or physical therapy if necessary. If you were given medications for your condition, do not drive, operate machinery or power tools, or sign legal documents for 24 hours. Do not drink alcohol, take sleeping pills, or take other medications that may interfere with treatment. See your caregiver immediately if you have pain that is becoming worse and not relieved by medications. Document Released: 09/25/2000 Document Revised: 10/21/2012 Document Reviewed: 12/31/2004 [facility] Patient Information 2015 [place], [place]. This information is not intended to replace advice given to you by your health care provider. Make sure you discuss any questions you have with your health care provider.

## 2014-04-25 NOTE — ED Notes (Signed)
Pt has multiple complaints today.  Abdominal pain, back pain, groin pain, headache, bilateral leg pain.  Extensive medical history.  No n/v/d.  Pt does take lactulose for his cirrhosis of the liver.  Pt recently here on 3/31 for dehydration.  Stayed a few days.  Pt sent here today by MD office.

## 2014-04-26 ENCOUNTER — Ambulatory Visit (HOSPITAL_COMMUNITY): Payer: Self-pay | Admitting: Dentistry

## 2014-04-27 ENCOUNTER — Encounter (HOSPITAL_COMMUNITY): Payer: Self-pay

## 2014-04-27 ENCOUNTER — Inpatient Hospital Stay (HOSPITAL_COMMUNITY)
Admission: EM | Admit: 2014-04-27 | Discharge: 2014-05-15 | DRG: 853 | Disposition: E | Payer: Medicare Other | Attending: Pulmonary Disease | Admitting: Pulmonary Disease

## 2014-04-27 DIAGNOSIS — K7581 Nonalcoholic steatohepatitis (NASH): Secondary | ICD-10-CM | POA: Diagnosis present

## 2014-04-27 DIAGNOSIS — I4891 Unspecified atrial fibrillation: Secondary | ICD-10-CM | POA: Diagnosis present

## 2014-04-27 DIAGNOSIS — Z4659 Encounter for fitting and adjustment of other gastrointestinal appliance and device: Secondary | ICD-10-CM

## 2014-04-27 DIAGNOSIS — J189 Pneumonia, unspecified organism: Secondary | ICD-10-CM | POA: Diagnosis not present

## 2014-04-27 DIAGNOSIS — E860 Dehydration: Secondary | ICD-10-CM | POA: Diagnosis present

## 2014-04-27 DIAGNOSIS — Z515 Encounter for palliative care: Secondary | ICD-10-CM | POA: Diagnosis not present

## 2014-04-27 DIAGNOSIS — K567 Ileus, unspecified: Secondary | ICD-10-CM

## 2014-04-27 DIAGNOSIS — N183 Chronic kidney disease, stage 3 (moderate): Secondary | ICD-10-CM | POA: Diagnosis present

## 2014-04-27 DIAGNOSIS — I9581 Postprocedural hypotension: Secondary | ICD-10-CM | POA: Diagnosis not present

## 2014-04-27 DIAGNOSIS — M545 Low back pain: Secondary | ICD-10-CM | POA: Diagnosis present

## 2014-04-27 DIAGNOSIS — R0600 Dyspnea, unspecified: Secondary | ICD-10-CM

## 2014-04-27 DIAGNOSIS — D696 Thrombocytopenia, unspecified: Secondary | ICD-10-CM | POA: Diagnosis not present

## 2014-04-27 DIAGNOSIS — Z8249 Family history of ischemic heart disease and other diseases of the circulatory system: Secondary | ICD-10-CM | POA: Diagnosis not present

## 2014-04-27 DIAGNOSIS — R140 Abdominal distension (gaseous): Secondary | ICD-10-CM

## 2014-04-27 DIAGNOSIS — K5669 Other intestinal obstruction: Secondary | ICD-10-CM | POA: Diagnosis not present

## 2014-04-27 DIAGNOSIS — R188 Other ascites: Secondary | ICD-10-CM | POA: Diagnosis present

## 2014-04-27 DIAGNOSIS — J9601 Acute respiratory failure with hypoxia: Secondary | ICD-10-CM | POA: Diagnosis not present

## 2014-04-27 DIAGNOSIS — E7220 Disorder of urea cycle metabolism, unspecified: Secondary | ICD-10-CM | POA: Diagnosis not present

## 2014-04-27 DIAGNOSIS — Z6828 Body mass index (BMI) 28.0-28.9, adult: Secondary | ICD-10-CM | POA: Diagnosis not present

## 2014-04-27 DIAGNOSIS — Z452 Encounter for adjustment and management of vascular access device: Secondary | ICD-10-CM

## 2014-04-27 DIAGNOSIS — F17210 Nicotine dependence, cigarettes, uncomplicated: Secondary | ICD-10-CM | POA: Diagnosis present

## 2014-04-27 DIAGNOSIS — G9340 Encephalopathy, unspecified: Secondary | ICD-10-CM | POA: Diagnosis present

## 2014-04-27 DIAGNOSIS — D6959 Other secondary thrombocytopenia: Secondary | ICD-10-CM | POA: Diagnosis present

## 2014-04-27 DIAGNOSIS — R601 Generalized edema: Secondary | ICD-10-CM | POA: Diagnosis not present

## 2014-04-27 DIAGNOSIS — IMO0002 Reserved for concepts with insufficient information to code with codable children: Secondary | ICD-10-CM | POA: Diagnosis present

## 2014-04-27 DIAGNOSIS — E872 Acidosis, unspecified: Secondary | ICD-10-CM | POA: Insufficient documentation

## 2014-04-27 DIAGNOSIS — J9811 Atelectasis: Secondary | ICD-10-CM | POA: Insufficient documentation

## 2014-04-27 DIAGNOSIS — K7290 Hepatic failure, unspecified without coma: Secondary | ICD-10-CM | POA: Diagnosis not present

## 2014-04-27 DIAGNOSIS — K7031 Alcoholic cirrhosis of liver with ascites: Secondary | ICD-10-CM | POA: Diagnosis present

## 2014-04-27 DIAGNOSIS — Z79891 Long term (current) use of opiate analgesic: Secondary | ICD-10-CM | POA: Diagnosis not present

## 2014-04-27 DIAGNOSIS — D638 Anemia in other chronic diseases classified elsewhere: Secondary | ICD-10-CM | POA: Diagnosis present

## 2014-04-27 DIAGNOSIS — Z880 Allergy status to penicillin: Secondary | ICD-10-CM | POA: Diagnosis not present

## 2014-04-27 DIAGNOSIS — Z8711 Personal history of peptic ulcer disease: Secondary | ICD-10-CM | POA: Diagnosis not present

## 2014-04-27 DIAGNOSIS — Z8701 Personal history of pneumonia (recurrent): Secondary | ICD-10-CM | POA: Diagnosis not present

## 2014-04-27 DIAGNOSIS — T148 Other injury of unspecified body region: Secondary | ICD-10-CM | POA: Diagnosis not present

## 2014-04-27 DIAGNOSIS — R6521 Severe sepsis with septic shock: Secondary | ICD-10-CM

## 2014-04-27 DIAGNOSIS — Z888 Allergy status to other drugs, medicaments and biological substances status: Secondary | ICD-10-CM | POA: Diagnosis not present

## 2014-04-27 DIAGNOSIS — E43 Unspecified severe protein-calorie malnutrition: Secondary | ICD-10-CM | POA: Diagnosis present

## 2014-04-27 DIAGNOSIS — Z66 Do not resuscitate: Secondary | ICD-10-CM | POA: Diagnosis present

## 2014-04-27 DIAGNOSIS — J9690 Respiratory failure, unspecified, unspecified whether with hypoxia or hypercapnia: Secondary | ICD-10-CM

## 2014-04-27 DIAGNOSIS — D689 Coagulation defect, unspecified: Secondary | ICD-10-CM | POA: Diagnosis present

## 2014-04-27 DIAGNOSIS — R319 Hematuria, unspecified: Secondary | ICD-10-CM | POA: Diagnosis present

## 2014-04-27 DIAGNOSIS — K565 Intestinal adhesions [bands] with obstruction (postprocedural) (postinfection): Secondary | ICD-10-CM | POA: Diagnosis present

## 2014-04-27 DIAGNOSIS — E871 Hypo-osmolality and hyponatremia: Secondary | ICD-10-CM | POA: Diagnosis present

## 2014-04-27 DIAGNOSIS — K7460 Unspecified cirrhosis of liver: Secondary | ICD-10-CM | POA: Diagnosis not present

## 2014-04-27 DIAGNOSIS — R579 Shock, unspecified: Secondary | ICD-10-CM | POA: Diagnosis not present

## 2014-04-27 DIAGNOSIS — K6389 Other specified diseases of intestine: Secondary | ICD-10-CM | POA: Diagnosis present

## 2014-04-27 DIAGNOSIS — Y95 Nosocomial condition: Secondary | ICD-10-CM | POA: Diagnosis present

## 2014-04-27 DIAGNOSIS — M549 Dorsalgia, unspecified: Secondary | ICD-10-CM

## 2014-04-27 DIAGNOSIS — I2699 Other pulmonary embolism without acute cor pulmonale: Secondary | ICD-10-CM | POA: Diagnosis not present

## 2014-04-27 DIAGNOSIS — N179 Acute kidney failure, unspecified: Secondary | ICD-10-CM | POA: Diagnosis not present

## 2014-04-27 DIAGNOSIS — A419 Sepsis, unspecified organism: Secondary | ICD-10-CM | POA: Insufficient documentation

## 2014-04-27 DIAGNOSIS — G8921 Chronic pain due to trauma: Secondary | ICD-10-CM | POA: Diagnosis present

## 2014-04-27 DIAGNOSIS — K7682 Hepatic encephalopathy: Secondary | ICD-10-CM | POA: Diagnosis present

## 2014-04-27 DIAGNOSIS — J449 Chronic obstructive pulmonary disease, unspecified: Secondary | ICD-10-CM | POA: Diagnosis present

## 2014-04-27 DIAGNOSIS — Z79899 Other long term (current) drug therapy: Secondary | ICD-10-CM | POA: Diagnosis not present

## 2014-04-27 DIAGNOSIS — M4856XA Collapsed vertebra, not elsewhere classified, lumbar region, initial encounter for fracture: Secondary | ICD-10-CM | POA: Diagnosis present

## 2014-04-27 DIAGNOSIS — M4850XS Collapsed vertebra, not elsewhere classified, site unspecified, sequela of fracture: Secondary | ICD-10-CM | POA: Diagnosis present

## 2014-04-27 DIAGNOSIS — R739 Hyperglycemia, unspecified: Secondary | ICD-10-CM | POA: Diagnosis present

## 2014-04-27 DIAGNOSIS — J811 Chronic pulmonary edema: Secondary | ICD-10-CM | POA: Diagnosis present

## 2014-04-27 DIAGNOSIS — K56609 Unspecified intestinal obstruction, unspecified as to partial versus complete obstruction: Secondary | ICD-10-CM

## 2014-04-27 LAB — COMPREHENSIVE METABOLIC PANEL
ALT: 23 U/L (ref 0–53)
ANION GAP: 7 (ref 5–15)
AST: 37 U/L (ref 0–37)
Albumin: 2.3 g/dL — ABNORMAL LOW (ref 3.5–5.2)
Alkaline Phosphatase: 160 U/L — ABNORMAL HIGH (ref 39–117)
BILIRUBIN TOTAL: 2.9 mg/dL — AB (ref 0.3–1.2)
BUN: 18 mg/dL (ref 6–23)
CO2: 23 mmol/L (ref 19–32)
Calcium: 7.4 mg/dL — ABNORMAL LOW (ref 8.4–10.5)
Chloride: 100 mmol/L (ref 96–112)
Creatinine, Ser: 1.07 mg/dL (ref 0.50–1.35)
GFR, EST AFRICAN AMERICAN: 80 mL/min — AB (ref >90)
GFR, EST NON AFRICAN AMERICAN: 69 mL/min — AB (ref >90)
Glucose, Bld: 164 mg/dL — ABNORMAL HIGH (ref 70–99)
Potassium: 4.1 mmol/L (ref 3.5–5.1)
SODIUM: 130 mmol/L — AB (ref 135–145)
Total Protein: 5.4 g/dL — ABNORMAL LOW (ref 6.0–8.3)

## 2014-04-27 LAB — CALCIUM, IONIZED: Calcium, Ion: 1.07 mmol/L — ABNORMAL LOW (ref 1.12–1.32)

## 2014-04-27 LAB — CBC WITH DIFFERENTIAL/PLATELET
BASOS ABS: 0.1 10*3/uL (ref 0.0–0.1)
BASOS PCT: 1 % (ref 0–1)
Eosinophils Absolute: 0.3 10*3/uL (ref 0.0–0.7)
Eosinophils Relative: 4 % (ref 0–5)
HEMATOCRIT: 23.6 % — AB (ref 39.0–52.0)
Hemoglobin: 8 g/dL — ABNORMAL LOW (ref 13.0–17.0)
Lymphocytes Relative: 10 % — ABNORMAL LOW (ref 12–46)
Lymphs Abs: 0.7 10*3/uL (ref 0.7–4.0)
MCH: 31.9 pg (ref 26.0–34.0)
MCHC: 33.9 g/dL (ref 30.0–36.0)
MCV: 94 fL (ref 78.0–100.0)
Monocytes Absolute: 0.9 10*3/uL (ref 0.1–1.0)
Monocytes Relative: 12 % (ref 3–12)
NEUTROS ABS: 5.2 10*3/uL (ref 1.7–7.7)
Neutrophils Relative %: 73 % (ref 43–77)
Platelets: 97 10*3/uL — ABNORMAL LOW (ref 150–400)
RBC: 2.51 MIL/uL — ABNORMAL LOW (ref 4.22–5.81)
RDW: 19.6 % — AB (ref 11.5–15.5)
WBC: 7.1 10*3/uL (ref 4.0–10.5)

## 2014-04-27 LAB — URINE MICROSCOPIC-ADD ON

## 2014-04-27 LAB — URINALYSIS, ROUTINE W REFLEX MICROSCOPIC
Bilirubin Urine: NEGATIVE
Glucose, UA: NEGATIVE mg/dL
KETONES UR: NEGATIVE mg/dL
Leukocytes, UA: NEGATIVE
Nitrite: NEGATIVE
PROTEIN: NEGATIVE mg/dL
Specific Gravity, Urine: 1.015 (ref 1.005–1.030)
UROBILINOGEN UA: 1 mg/dL (ref 0.0–1.0)
pH: 6 (ref 5.0–8.0)

## 2014-04-27 LAB — AMMONIA: Ammonia: 103 umol/L — ABNORMAL HIGH (ref 11–32)

## 2014-04-27 LAB — I-STAT CG4 LACTIC ACID, ED: Lactic Acid, Venous: 2.49 mmol/L (ref 0.5–2.0)

## 2014-04-27 MED ORDER — MAGNESIUM OXIDE 400 (241.3 MG) MG PO TABS
200.0000 mg | ORAL_TABLET | Freq: Every day | ORAL | Status: DC
  Administered 2014-04-28 – 2014-04-30 (×3): 200 mg via ORAL
  Filled 2014-04-27 (×4): qty 0.5

## 2014-04-27 MED ORDER — FENTANYL CITRATE 0.05 MG/ML IJ SOLN
50.0000 ug | Freq: Once | INTRAMUSCULAR | Status: AC
  Administered 2014-04-27: 50 ug via INTRAVENOUS
  Filled 2014-04-27: qty 2

## 2014-04-27 MED ORDER — POLYSACCHARIDE IRON COMPLEX 150 MG PO CAPS
150.0000 mg | ORAL_CAPSULE | Freq: Every day | ORAL | Status: DC
  Administered 2014-04-28 – 2014-04-30 (×3): 150 mg via ORAL
  Filled 2014-04-27 (×4): qty 1

## 2014-04-27 MED ORDER — VITAMIN D (ERGOCALCIFEROL) 1.25 MG (50000 UNIT) PO CAPS: 50000.0000 [IU] | ORAL_CAPSULE | ORAL | Status: DC

## 2014-04-27 MED ORDER — OXYCODONE HCL 5 MG PO TABS
5.0000 mg | ORAL_TABLET | Freq: Four times a day (QID) | ORAL | Status: DC | PRN
  Administered 2014-04-27 – 2014-04-29 (×5): 5 mg via ORAL
  Filled 2014-04-27 (×5): qty 1

## 2014-04-27 MED ORDER — LACTULOSE 10 GM/15ML PO SOLN
30.0000 g | Freq: Once | ORAL | Status: AC
  Administered 2014-04-27: 30 g via ORAL
  Filled 2014-04-27: qty 45

## 2014-04-27 MED ORDER — DIPHENHYDRAMINE HCL 25 MG PO CAPS
25.0000 mg | ORAL_CAPSULE | Freq: Two times a day (BID) | ORAL | Status: DC | PRN
  Administered 2014-04-27 – 2014-04-29 (×3): 25 mg via ORAL
  Filled 2014-04-27 (×4): qty 1

## 2014-04-27 MED ORDER — ONDANSETRON HCL 4 MG PO TABS: 4.0000 mg | ORAL_TABLET | Freq: Four times a day (QID) | ORAL | Status: DC | PRN

## 2014-04-27 MED ORDER — RIFAXIMIN 550 MG PO TABS
550.0000 mg | ORAL_TABLET | Freq: Two times a day (BID) | ORAL | Status: DC
  Administered 2014-04-27 – 2014-04-30 (×7): 550 mg via ORAL
  Filled 2014-04-27 (×9): qty 1

## 2014-04-27 MED ORDER — DOCUSATE SODIUM 100 MG PO CAPS
100.0000 mg | ORAL_CAPSULE | Freq: Two times a day (BID) | ORAL | Status: DC
  Administered 2014-04-28 – 2014-04-30 (×6): 100 mg via ORAL
  Filled 2014-04-27 (×8): qty 1

## 2014-04-27 MED ORDER — SODIUM CHLORIDE 0.9 % IV SOLN
INTRAVENOUS | Status: AC
  Administered 2014-04-27: 20:00:00 via INTRAVENOUS

## 2014-04-27 MED ORDER — SODIUM CHLORIDE 0.9 % IV BOLUS (SEPSIS)
500.0000 mL | Freq: Once | INTRAVENOUS | Status: AC
  Administered 2014-04-27: 500 mL via INTRAVENOUS

## 2014-04-27 MED ORDER — BOOST / RESOURCE BREEZE PO LIQD
1.0000 | Freq: Three times a day (TID) | ORAL | Status: DC
  Administered 2014-04-27 – 2014-04-29 (×5): 1 via ORAL
  Administered 2014-04-29: 21:00:00 via ORAL
  Administered 2014-04-30: 1 via ORAL

## 2014-04-27 MED ORDER — MAGNESIUM 250 MG PO TABS: 250.0000 mg | ORAL_TABLET | Freq: Every day | ORAL | Status: DC

## 2014-04-27 MED ORDER — BOOST PO LIQD
237.0000 mL | Freq: Every day | ORAL | Status: DC | PRN
  Filled 2014-04-27: qty 237

## 2014-04-27 MED ORDER — LACTULOSE 10 GM/15ML PO SOLN
30.0000 g | Freq: Three times a day (TID) | ORAL | Status: DC
  Administered 2014-04-27 – 2014-04-30 (×10): 30 g via ORAL
  Filled 2014-04-27 (×13): qty 45

## 2014-04-27 MED ORDER — ONDANSETRON HCL 4 MG/2ML IJ SOLN
4.0000 mg | Freq: Four times a day (QID) | INTRAMUSCULAR | Status: DC | PRN
  Administered 2014-05-01 – 2014-05-05 (×2): 4 mg via INTRAVENOUS
  Filled 2014-04-27 (×2): qty 2

## 2014-04-27 MED ORDER — PANTOPRAZOLE SODIUM 40 MG PO TBEC
40.0000 mg | DELAYED_RELEASE_TABLET | Freq: Every day | ORAL | Status: DC
  Administered 2014-04-28 – 2014-04-30 (×3): 40 mg via ORAL
  Filled 2014-04-27 (×4): qty 1

## 2014-04-27 MED ORDER — TEMAZEPAM 7.5 MG PO CAPS
7.5000 mg | ORAL_CAPSULE | Freq: Every evening | ORAL | Status: DC | PRN
  Administered 2014-04-29: 7.5 mg via ORAL
  Filled 2014-04-27: qty 1

## 2014-04-27 NOTE — ED Notes (Signed)
Pt with abdominal pain, back pain and leg pain.  Started on Friday night. BY Sunday he could not get out of bed.  On Monday, brought in to ED.  Pt has cirrhosis of liver.  Pt sent home post fluids and pain meds.  Pain continues.   Pt has paused his lactulose dosing d/t inability to move.  No fever.  Urine is dark.  No diarrhea.

## 2014-04-27 NOTE — H&P (Signed)
Patient Demographics  Clifford Cook, is a 69 y.o. male  MRN: [id number]   DOB - [birth year]  Admit Date - 05/01/2014  Outpatient Primary MD for the patient is [name], MD   With History of -  Past Medical History  Diagnosis Date  . Stomach ulcer   . GI bleed   . Liver disorder   . Cirrhosis   . Complication of anesthesia     slow to wake up with colonoscopy - 2013   . Dysrhythmia     afib, aflutter - during hospitalization and resolved no cardiac followup   . Pneumonia     septic pneumonia - 10/2013   . Chronic kidney disease     hx of stage III CKD   . Thrombocytopenia   . Pneumonia due to streptococcus, group A 10/31/2013  . Hyponatremia 10/31/2013  . Respiratory failure with hypoxia 10/31/2013  . Rib fracture 10/31/2013  . Hip fracture 12/05/2013  . Atrial flutter 12/08/2013  . Encephalopathy, hepatic   . Abscess of hand       Past Surgical History  Procedure Laterality Date  . Cataract extraction, bilateral    . Surgery for lazy eye      age 70  . Esophagogastroduodenoscopy N/A 04/16/2012    Procedure: ESOPHAGOGASTRODUODENOSCOPY (EGD);  Surgeon: Jeryl Columbia, MD;  Location: [facility];  Service: Endoscopy;  Laterality: N/A;  . Esophagogastroduodenoscopy N/A 04/17/2012    Procedure: ESOPHAGOGASTRODUODENOSCOPY (EGD);  Surgeon: Jeryl Columbia, MD;  Location: [facility];  Service: Endoscopy;  Laterality: N/A;  . Direct laryngoscopy N/A 07/13/2013    Procedure: DIRECT LARYNGOSCOPY WITH EXCISION TUMOR;  Surgeon: Rozetta Nunnery, MD;  Location: [place];  Service: ENT;  Laterality: N/A;  . Hip pinning,cannulated Left 12/06/2013    Procedure: CANNULATED HIP PINNING;  Surgeon: Renette Butters, MD;  Location: [place];  Service: Orthopedics;  Laterality: Left;  . Tee without cardioversion N/A 12/13/2013    Procedure: TRANSESOPHAGEAL ECHOCARDIOGRAM (TEE);  Surgeon: Larey Dresser, MD;  Location: [facility];  Service: Cardiovascular;  Laterality:  N/A;  . Multiple extractions with alveoloplasty N/A 04/14/2014    Procedure: Extraction of tooth #'s 3,4,5,6,7,8,9,10,11,15,21,22,23,24,25,26,27,28 WITH ALVEOLOPLASTY;  Surgeon: Lenn Cal, DDS;  Location: [facility];  Service: Oral Surgery;  Laterality: N/A;    in for   Chief Complaint  Patient presents with  . Abdominal Pain  . Back Pain     HPI  Clifford Cook  is a 69 y.o. male, with past medical history of [name], cirrhosis, anemia and thrombocytopenia, presents with multiple complaints later to generalized weakness, abdominal pain, lower back pain, patient denies any hematemesis, diarrhea, fever or chills, reports abdominal pain is chronic, afebrile in ED, patient workup was significant for elevated ammonia level at 103, patient reports she has not been taking his lactulose recently given he was not feeling well, reports he usually takes it 1-2 times daily, with good bowel movements, but no bowel movements over the last 48 hours, denies any lethargy, confusion or altered mental status, is one dose of lactulose in ED and hospitalist requested to admit for further management of hyperammonemia, agent has multiple abnormality which is at his baseline including anemia with hemoglobin of 8, thrombocytopenia with platelet of 97.    Review of Systems    In addition to the HPI above,  No Fever-chills, No Headache, No changes with Vision or hearing, No problems swallowing food or Liquids, No Chest pain, Cough or  Shortness of Breath, Complains of chronic Abdominal pain, No Nausea or Vommitting, no bowel movements over the last 48 hours. No Blood in stool or Urine, No dysuria, No new skin rashes or bruises, No new joints pains-aches,  No new weakness, tingling, numbness in any extremity, No recent weight gain or loss, No polyuria, polydypsia or polyphagia, No significant Mental Stressors.  A full 10 point Review of Systems was done, except as stated above, all other Review of Systems were  negative.   Social History History  Substance Use Topics  . Smoking status: Current Every Day Smoker -- 1.00 packs/day    Types: Cigarettes  . Smokeless tobacco: Never Used  . Alcohol Use: No     Family History Family History  Problem Relation Age of Onset  . Heart attack Mother   . Cirrhosis Sister     Prior to Admission medications   Medication Sig Start Date End Date Taking? Authorizing Provider  diphenhydrAMINE (BENADRYL) 25 mg capsule Take 1 capsule (25 mg total) by mouth every 12 (twelve) hours as needed for itching. Patient taking differently: Take 25 mg by mouth at bedtime.  11/09/13  Yes Barton Dubois, MD  furosemide (LASIX) 80 MG tablet Take one tablet in the am and 1/2 tablet in the PM Patient taking differently: Take 80 mg by mouth 2 (two) times daily.  03/21/14  Yes Charlynne Cousins, MD  lactose free nutrition (BOOST) LIQD Take 237 mLs by mouth daily as needed (Unable to eat).   Yes Historical Provider, MD  lactulose (CHRONULAC) 10 GM/15ML solution Take 30 mLs (20 g total) by mouth daily. 03/21/14  Yes Charlynne Cousins, MD  lidocaine (XYLOCAINE) 2 % solution Use as directed 10 mLs in the mouth or throat daily. 03/21/14  Yes Historical Provider, MD  Magnesium 250 MG TABS Take 250 mg by mouth daily.   Yes Historical Provider, MD  oxyCODONE (OXY IR/ROXICODONE) 5 MG immediate release tablet Take 1 tablet (5 mg total) by mouth every 6 (six) hours as needed for moderate pain. 03/21/14  Yes Charlynne Cousins, MD  pantoprazole (PROTONIX) 40 MG tablet Take 1 tablet (40 mg total) by mouth daily. Patient taking differently: Take 40 mg by mouth 2 (two) times daily.  11/09/13  Yes Barton Dubois, MD  Polysaccharide Iron Complex (POLY-IRON 150 PO) Take 1 capsule by mouth daily.   Yes Historical Provider, MD  rifaximin (XIFAXAN) 550 MG TABS tablet Take 1 tablet (550 mg total) by mouth 2 (two) times daily. 03/21/14  Yes Charlynne Cousins, MD  spironolactone (ALDACTONE) 100 MG tablet  Take 100 mg by mouth daily.   Yes Historical Provider, MD  Vitamin D, Ergocalciferol, (DRISDOL) 50000 UNITS CAPS capsule Take 50,000 Units by mouth every 7 (seven) days.   Yes Historical Provider, MD  docusate sodium (COLACE) 100 MG capsule Take 1 capsule (100 mg total) by mouth 2 (two) times daily. Patient not taking: Reported on 01/31/2014 12/06/13   Renette Butters, MD  feeding supplement, RESOURCE BREEZE, (RESOURCE BREEZE) LIQD Take 1 Container by mouth 3 (three) times daily between meals. Patient not taking: Reported on 12/05/2013 11/09/13   Barton Dubois, MD  temazepam (RESTORIL) 7.5 MG capsule Take 1 capsule (7.5 mg total) by mouth at bedtime as needed for sleep. Patient not taking: Reported on 03/16/2014 11/09/13   Blanchie Serve, MD    Allergies  Allergen Reactions  . Pulmicort [Budesonide] Shortness Of Breath    Causes stridor  . Penicillins Other (See Comments)  Rash hives, white spots.    Physical Exam  Vitals  Blood pressure 100/50, pulse 87, temperature 97.7 F (36.5 C), temperature source Oral, resp. rate 18, SpO2 97 %.   1. General frail elderly ill-appearing male lying in bed in NAD,    2. Normal affect and insight, Not Suicidal or Homicidal, Awake Alert, Oriented X 3.  3. No F.N deficits, ALL C.Nerves Intact, grossly intact movements in extremities, Plantars down going.  4. Ears and Eyes appear Normal, Conjunctivae clear, PERRLA. Moist Oral Mucosa.  5. Supple Neck, No JVD, No cervical lymphadenopathy appriciated, No Carotid Bruits.  6. Symmetrical Chest wall movement, Good air movement bilaterally, CTAB.  7. RRR, No Gallops, Rubs or Murmurs, No Parasternal Heave.  8. Positive Bowel Sounds, Abdomen Soft, as mild ascites ,No tenderness,No rebound -guarding or rigidity.  9.  No Cyanosis, Normal Skin Turgor, multiple skin varices(telangiectasia)  10. Good muscle tone,  joints appear normal , no effusions, Normal ROM. +3 edema bilaterally(chronic, his  baseline)  11. No Palpable Lymph Nodes in Neck or Axillae    Data Review  CBC  Recent Labs Lab 04/25/14 1222 04/21/2014 1526  WBC 7.6 7.1  HGB 7.7* 8.0*  HCT 22.6* 23.6*  PLT PLATELET CLUMPS NOTED ON SMEAR, UNABLE TO ESTIMATE 97*  MCV 94.6 94.0  MCH 32.2 31.9  MCHC 34.1 33.9  RDW 19.7* 19.6*  LYMPHSABS 0.5* 0.7  MONOABS 1.0 0.9  EOSABS 0.1 0.3  BASOSABS 0.1 0.1   ------------------------------------------------------------------------------------------------------------------  Chemistries   Recent Labs Lab 04/25/14 1222 04/19/2014 1526  NA 130* 130*  K 3.8 4.1  CL 96 100  CO2 24 23  GLUCOSE 144* 164*  BUN 15 18  CREATININE 1.05 1.07  CALCIUM 7.6* 7.4*  AST 36 37  ALT 24 23  ALKPHOS 148* 160*  BILITOT 3.1* 2.9*   ------------------------------------------------------------------------------------------------------------------ estimated creatinine clearance is 71.6 mL/min (by C-G formula based on Cr of 1.07). ------------------------------------------------------------------------------------------------------------------ No results for input(s): TSH, T4TOTAL, T3FREE, THYROIDAB in the last 72 hours.  Invalid input(s): FREET3   Coagulation profile No results for input(s): INR, PROTIME in the last 168 hours. ------------------------------------------------------------------------------------------------------------------- No results for input(s): DDIMER in the last 72 hours. -------------------------------------------------------------------------------------------------------------------  Cardiac Enzymes No results for input(s): CKMB, TROPONINI, MYOGLOBIN in the last 168 hours.  Invalid input(s): CK ------------------------------------------------------------------------------------------------------------------ Invalid input(s):  POCBNP   ---------------------------------------------------------------------------------------------------------------  Urinalysis    Component Value Date/Time   COLORURINE AMBER* 04/25/2014 [place] 04/25/2014 1131   LABSPEC 1.013 04/25/2014 1131   PHURINE 6.5 04/25/2014 1131   GLUCOSEU NEGATIVE 04/25/2014 1131   HGBUR MODERATE* 04/25/2014 1131   BILIRUBINUR NEGATIVE 04/25/2014 1131   KETONESUR NEGATIVE 04/25/2014 1131   PROTEINUR NEGATIVE 04/25/2014 1131   UROBILINOGEN 1.0 04/25/2014 1131   NITRITE NEGATIVE 04/25/2014 1131   LEUKOCYTESUR NEGATIVE 04/25/2014 1131    ----------------------------------------------------------------------------------------------------------------  Imaging results:   No results found.      Assessment & Plan  Active Problems:   Liver cirrhosis secondary to NASH   Anemia   Thrombocytopenia   Hyperammonemia   Weakness - This is most likely related to patient chronic deconditioning and liver disease, exacerbated by elevated ammonia level. - PT consult.  Hyperammonemia - Suspect secondary to not taking his lactulose for the last 2 days, will increase his lactulose to 20 meals 3 times daily, recheck ammonia level in a.m.  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 - Continue  with rifaximin and lactulose.  Thrombocytopenia - Chronic, at baseline secondary to liver disease, no indication for transfusion.  Anemia - Anemia secondary to chronic disease, at baseline.  DVT Prophylaxis  SCDs ( given his thrombocytopenia and coagulopathy)  AM Labs Ordered, also please review Full Orders  Family Communication: Admission, patients condition and plan of care including tests being ordered have been discussed with the patient who indicate understanding and agree with the plan and Code Status.  Code Status full  Likely DC to   home when stable  Condition GUARDED    Time spent in minutes : 55 minutes    Adrean Heitz M.D on 05/01/2014 at 6:29 PM  Between 7am to 7pm - Pager - [phone]  After 7pm go to [email or web address] - password [email or web address]  And look for the night coverage person covering me after hours  [facility] Office  [phone]   **Disclaimer: This note may have been dictated with voice recognition software. Similar sounding words can inadvertently be transcribed and this note may contain transcription errors which may not have been corrected upon publication of note.**

## 2014-04-27 NOTE — ED Provider Notes (Signed)
CSN: [id number]     Arrival date & time 04/30/2014  1440 History   First MD Initiated Contact with Patient 04/24/2014 1603     Chief Complaint  Patient presents with  . Abdominal Pain  . Back Pain    HPI Patient presents with concern of ongoing back pain, abdominal pain, fatigue. Symptoms have been present for almost 1 week, but worsened over the past few days. No new fall, syncope, chest pain. Patient has baseline dyspnea, which is unchanged. No clear precipitating condition. Over the time the patient's daughter has presumed his weakness has been due to increased ammonia levels. He has been taking increased amounts of lactulose dosing. Patient has no bowel movements in 2 days now. Patient is a history of cirrhosis, encephalopathy. Pain is sore, severe, worse with any attempts to move. Currently patient is bedbound, unable to perform activities or normal one week ago.  Past Medical History  Diagnosis Date  . Stomach ulcer   . GI bleed   . Liver disorder   . Cirrhosis   . Complication of anesthesia     slow to wake up with colonoscopy - 2013   . Dysrhythmia     afib, aflutter - during hospitalization and resolved no cardiac followup   . Pneumonia     septic pneumonia - 10/2013   . Chronic kidney disease     hx of stage III CKD   . Thrombocytopenia   . Pneumonia due to streptococcus, group A 10/31/2013  . Hyponatremia 10/31/2013  . Respiratory failure with hypoxia 10/31/2013  . Rib fracture 10/31/2013  . Hip fracture 12/05/2013  . Atrial flutter 12/08/2013  . Encephalopathy, hepatic   . Abscess of hand    Past Surgical History  Procedure Laterality Date  . Cataract extraction, bilateral    . Surgery for lazy eye      age 61  . Esophagogastroduodenoscopy N/A 04/16/2012    Procedure: ESOPHAGOGASTRODUODENOSCOPY (EGD);  Surgeon: Jeryl Columbia, MD;  Location: [facility];  Service: Endoscopy;  Laterality: N/A;  . Esophagogastroduodenoscopy N/A 04/17/2012    Procedure:  ESOPHAGOGASTRODUODENOSCOPY (EGD);  Surgeon: Jeryl Columbia, MD;  Location: [facility];  Service: Endoscopy;  Laterality: N/A;  . Direct laryngoscopy N/A 07/13/2013    Procedure: DIRECT LARYNGOSCOPY WITH EXCISION TUMOR;  Surgeon: Rozetta Nunnery, MD;  Location: [place];  Service: ENT;  Laterality: N/A;  . Hip pinning,cannulated Left 12/06/2013    Procedure: CANNULATED HIP PINNING;  Surgeon: Renette Butters, MD;  Location: [place];  Service: Orthopedics;  Laterality: Left;  . Tee without cardioversion N/A 12/13/2013    Procedure: TRANSESOPHAGEAL ECHOCARDIOGRAM (TEE);  Surgeon: Larey Dresser, MD;  Location: [facility];  Service: Cardiovascular;  Laterality: N/A;  . Multiple extractions with alveoloplasty N/A 04/14/2014    Procedure: Extraction of tooth #'s 3,4,5,6,7,8,9,10,11,15,21,22,23,24,25,26,27,28 WITH ALVEOLOPLASTY;  Surgeon: Lenn Cal, DDS;  Location: [facility];  Service: Oral Surgery;  Laterality: N/A;   Family History  Problem Relation Age of Onset  . Heart attack Mother   . Cirrhosis Sister    History  Substance Use Topics  . Smoking status: Current Every Day Smoker -- 1.00 packs/day    Types: Cigarettes  . Smokeless tobacco: Never Used  . Alcohol Use: No    Review of Systems  Constitutional:       Per HPI, otherwise negative  HENT:       Per HPI, otherwise negative  Respiratory:       Per HPI,  otherwise negative  Cardiovascular:       Per HPI, otherwise negative  Gastrointestinal: Positive for abdominal pain. Negative for vomiting.  Endocrine:       Negative aside from HPI  Genitourinary:       Neg aside from HPI   Musculoskeletal:       Per HPI, otherwise negative  Skin: Negative.        Multiple multiple varices, unchanged  Neurological: Positive for weakness. Negative for syncope.      Allergies  Pulmicort and Penicillins  Home Medications   Prior to Admission medications   Medication Sig Start Date End Date Taking?  Authorizing Provider  diphenhydrAMINE (BENADRYL) 25 mg capsule Take 1 capsule (25 mg total) by mouth every 12 (twelve) hours as needed for itching. Patient taking differently: Take 25 mg by mouth at bedtime.  11/09/13  Yes Barton Dubois, MD  furosemide (LASIX) 80 MG tablet Take one tablet in the am and 1/2 tablet in the PM Patient taking differently: Take 80 mg by mouth 2 (two) times daily.  03/21/14  Yes Charlynne Cousins, MD  lactose free nutrition (BOOST) LIQD Take 237 mLs by mouth daily as needed (Unable to eat).   Yes Historical Provider, MD  lactulose (CHRONULAC) 10 GM/15ML solution Take 30 mLs (20 g total) by mouth daily. 03/21/14  Yes Charlynne Cousins, MD  lidocaine (XYLOCAINE) 2 % solution Use as directed 10 mLs in the mouth or throat daily. 03/21/14  Yes Historical Provider, MD  Magnesium 250 MG TABS Take 250 mg by mouth daily.   Yes Historical Provider, MD  oxyCODONE (OXY IR/ROXICODONE) 5 MG immediate release tablet Take 1 tablet (5 mg total) by mouth every 6 (six) hours as needed for moderate pain. 03/21/14  Yes Charlynne Cousins, MD  pantoprazole (PROTONIX) 40 MG tablet Take 1 tablet (40 mg total) by mouth daily. Patient taking differently: Take 40 mg by mouth 2 (two) times daily.  11/09/13  Yes Barton Dubois, MD  Polysaccharide Iron Complex (POLY-IRON 150 PO) Take 1 capsule by mouth daily.   Yes Historical Provider, MD  rifaximin (XIFAXAN) 550 MG TABS tablet Take 1 tablet (550 mg total) by mouth 2 (two) times daily. 03/21/14  Yes Charlynne Cousins, MD  spironolactone (ALDACTONE) 100 MG tablet Take 100 mg by mouth daily.   Yes Historical Provider, MD  Vitamin D, Ergocalciferol, (DRISDOL) 50000 UNITS CAPS capsule Take 50,000 Units by mouth every 7 (seven) days.   Yes Historical Provider, MD  docusate sodium (COLACE) 100 MG capsule Take 1 capsule (100 mg total) by mouth 2 (two) times daily. Patient not taking: Reported on 01/31/2014 12/06/13   Renette Butters, MD  feeding supplement,  RESOURCE BREEZE, (RESOURCE BREEZE) LIQD Take 1 Container by mouth 3 (three) times daily between meals. Patient not taking: Reported on 12/05/2013 11/09/13   Barton Dubois, MD  temazepam (RESTORIL) 7.5 MG capsule Take 1 capsule (7.5 mg total) by mouth at bedtime as needed for sleep. Patient not taking: Reported on 03/16/2014 11/09/13   Mahima Pandey, MD   BP 100/50 mmHg  Pulse 87  Temp(Src) 97.7 F (36.5 C) (Oral)  Resp 18  SpO2 97% Physical Exam  Constitutional: He is oriented to person, place, and time. He has a sickly appearance.  HENT:  Head: Normocephalic and atraumatic.  He is edentulous  Eyes: Conjunctivae and EOM are normal.  Cardiovascular: Normal rate and regular rhythm.   Pulmonary/Chest: Effort normal. No stridor. No respiratory distress.  Abdominal:  He exhibits no distension.  Musculoskeletal: He exhibits no edema.  Neurological: He is alert and oriented to person, place, and time. He displays atrophy. He displays no tremor. No cranial nerve deficit or sensory deficit. He displays no seizure activity. Coordination normal.  No asterixis, Diffuse atrophy in the lower extremities, with 4/5 strength bilaterally.   Skin: Skin is warm and dry.  Multiple cutaneous varices throughout.  Psychiatric: He has a normal mood and affect.  Nursing note and vitals reviewed.   ED Course  Procedures (including critical care time) Labs Review Labs Reviewed  CBC WITH DIFFERENTIAL/PLATELET - Abnormal; Notable for the following:    RBC 2.51 (*)    Hemoglobin 8.0 (*)    HCT 23.6 (*)    RDW 19.6 (*)    Platelets 97 (*)    Lymphocytes Relative 10 (*)    All other components within normal limits  COMPREHENSIVE METABOLIC PANEL - Abnormal; Notable for the following:    Sodium 130 (*)    Glucose, Bld 164 (*)    Calcium 7.4 (*)    Total Protein 5.4 (*)    Albumin 2.3 (*)    Alkaline Phosphatase 160 (*)    Total Bilirubin 2.9 (*)    GFR calc non Af Amer 69 (*)    GFR calc Af Amer 80 (*)     All other components within normal limits  AMMONIA - Abnormal; Notable for the following:    Ammonia 103 (*)    All other components within normal limits  I-STAT CG4 LACTIC ACID, ED - Abnormal; Notable for the following:    Lactic Acid, Venous 2.49 (*)    All other components within normal limits  URINALYSIS, ROUTINE W REFLEX MICROSCOPIC  CALCIUM, IONIZED     I reviewed the patient's chart, including visit 2 days ago, with similar presentation, patient was discharged after receiving fluids, analgesics.   Labs notable for hyperammonemia. Patient has persistent evidence for chronic kidney disease, liver failure.  Patient received a lactulose. Given the patient's persistent fatigue, hyperammonemia, he will be admitted for further evaluation and management.  MDM  Patient with cirrhosis presents with fatigue, listlessness. Here the patient is awake, oriented, but generally weak. No evidence for concurrent infection, with no substantial lactic acidosis, fever, and no leukocytosis. Patient received fluid resuscitation and with his hyperammonemia, lactulose. No evidence for peritonitis. Patient was admitted for further evaluation and management.Carmin Muskrat, MD 04/21/2014 [phone]

## 2014-04-28 LAB — COMPREHENSIVE METABOLIC PANEL
ALBUMIN: 1.9 g/dL — AB (ref 3.5–5.2)
ALT: 19 U/L (ref 0–53)
AST: 27 U/L (ref 0–37)
Alkaline Phosphatase: 123 U/L — ABNORMAL HIGH (ref 39–117)
Anion gap: 6 (ref 5–15)
BILIRUBIN TOTAL: 2.6 mg/dL — AB (ref 0.3–1.2)
BUN: 14 mg/dL (ref 6–23)
CHLORIDE: 103 mmol/L (ref 96–112)
CO2: 23 mmol/L (ref 19–32)
CREATININE: 0.83 mg/dL (ref 0.50–1.35)
Calcium: 7.3 mg/dL — ABNORMAL LOW (ref 8.4–10.5)
GFR calc Af Amer: >90 mL/min (ref >90)
GFR calc non Af Amer: 88 mL/min — ABNORMAL LOW (ref >90)
Glucose, Bld: 96 mg/dL (ref 70–99)
Potassium: 3.8 mmol/L (ref 3.5–5.1)
Sodium: 132 mmol/L — ABNORMAL LOW (ref 135–145)
TOTAL PROTEIN: 4.8 g/dL — AB (ref 6.0–8.3)

## 2014-04-28 LAB — CBC
HEMATOCRIT: 21.2 % — AB (ref 39.0–52.0)
HEMOGLOBIN: 7.1 g/dL — AB (ref 13.0–17.0)
MCH: 31.7 pg (ref 26.0–34.0)
MCHC: 33.5 g/dL (ref 30.0–36.0)
MCV: 94.6 fL (ref 78.0–100.0)
Platelets: 87 10*3/uL — ABNORMAL LOW (ref 150–400)
RBC: 2.24 MIL/uL — ABNORMAL LOW (ref 4.22–5.81)
RDW: 19.7 % — ABNORMAL HIGH (ref 11.5–15.5)
WBC: 5 10*3/uL (ref 4.0–10.5)

## 2014-04-28 LAB — PREPARE RBC (CROSSMATCH)

## 2014-04-28 LAB — AMMONIA: AMMONIA: 33 umol/L — AB (ref 11–32)

## 2014-04-28 MED ORDER — SODIUM CHLORIDE 0.9 % IV SOLN: Freq: Once | INTRAVENOUS | Status: DC

## 2014-04-28 NOTE — Progress Notes (Signed)
UR complete 

## 2014-04-28 NOTE — Progress Notes (Addendum)
PROGRESS NOTE  NAHOME BUBLITZ DGU:[id number] DOB: Jul 13, 1945 DOA: 04/19/2014 PCP: [name], MD  Brief history 69 year old male with a history of COPD, NASH cirrhosis, ascites, atrial flutter, thrombocytopenia presented with 3-4 day history of worsening lower extremity weakness and lower back pain. The patient quit taking his lactulose because he was having so much pain trying to get out of bed to use the restroom. He was without his lactulose for approximately 2 days. At the time of admission, his ammonia was 103. He endorses compliance with all his other medications including his diuretics. Over the past 1-2 weeks, he has also noted increasing abdominal girth. In addition, he has been complaining of lower abdominal pain for the past 3-4 days. He denied any fevers, chills, chest pain, shortness of breath, hemoptysis, vomiting, diarrhea, hematochezia, melena.  The patient denies bowel or bladder incontinence. He denies any recent falls or injuries. Assessment/Plan: Uncontrolled low back pain with leg weakness -MR Lumbar spine -pain control -straight leg test neg Ascites in pt with NASH Cirrhosis -US paracentesis -send ascites for culture and cell count -hold abx as pt is afebrile and hemodynamically stable Hyperammonemia -restart lactulose -recheck ammonia in am -continue rifaximin Hyponatremia -secondary to cirrhosis with component of volume depletion -hold diuretics today and re-eval in 24 hours Generalized weakness -multifactorial including deconditioning -PT eval Anemia of chronic disease -4/14--transfused one unit PRBC Thrombocytopenia -Secondary to cirrhosis -Monitor for signs of bleeding Hyperglycemia -check A1C  Family Communication:   Wife and daughter updated at beside Disposition Plan:   Home when medically stable       Procedures/Studies: Dg Chest 2 View  04/07/2014   CLINICAL DATA:  Preoperative exam prior to dental surgery ; history of  cirrhosis, NASH, and sepsis  EXAM: CHEST  2 VIEW  COMPARISON:  Portable chest x-ray of December 12, 2013  FINDINGS: The lungs are mildly hyperinflated. The interstitial markings are coarse. There is no alveolar infiltrate. There is a trace of blunting of the costophrenic angles. The heart and pulmonary vascularity are normal. The mediastinum is normal in width. The bony thorax is unremarkable.  IMPRESSION: COPD and mild pulmonary fibrotic changes. There is no CHF nor pneumonia.   Electronically Signed   By: Yosgar Demirjian  [place]   On: 04/07/2014 09:07         Subjective: Patient complains of low back pain without any urinary or bowel incontinence. He denies any fevers, chills, chest pain, shortness breath, coughing, hemoptysis, vomiting, diarrhea, hematochezia, melena.  Objective: Filed Vitals:   04/28/14 0945 04/28/14 1015 04/28/14 1100 04/28/14 1230  BP: 98/52 101/54 110/56 115/58  Pulse: 89 87 88 95  Temp: 98.2 F (36.8 C) 98.2 F (36.8 C) 98.5 F (36.9 C) 98.7 F (37.1 C)  TempSrc: Oral Oral Oral Oral  Resp: 18 16 18 18   Height:      SpO2: 95% 97% 97% 98%    Intake/Output Summary (Last 24 hours) at 04/28/14 1346 Last data filed at 04/28/14 1000  Gross per 24 hour  Intake   1775 ml  Output    300 ml  Net   1475 ml   Weight change:  Exam:   General:  Pt is alert, follows commands appropriately, not in acute distress  HEENT: No icterus, No thrush,[place]/AT  Cardiovascular: RRR, S1/S2, no rubs, no gallops  Respiratory: Diminished bases bilateral bases. Scattered bilateral rales without wheezing.  Abdomen: Soft/+BS, non tender, non distended, no guarding  Extremities: 2+  LE edema, No lymphangitis, No petechiae, No rashes, no synovitis  Data Reviewed: Basic Metabolic Panel:  Recent Labs Lab 04/25/14 1222 04/16/2014 1526 04/28/14 0535  NA 130* 130* 132*  K 3.8 4.1 3.8  CL 96 100 103  CO2 24 23 23   GLUCOSE 144* 164* 96  BUN 15 18 14   CREATININE 1.05 1.07 0.83  CALCIUM  7.6* 7.4* 7.3*   Liver Function Tests:  Recent Labs Lab 04/25/14 1222 04/21/2014 1526 04/28/14 0535  AST 36 37 27  ALT 24 23 19   ALKPHOS 148* 160* 123*  BILITOT 3.1* 2.9* 2.6*  PROT 5.4* 5.4* 4.8*  ALBUMIN 2.3* 2.3* 1.9*    Recent Labs Lab 04/25/14 1222  LIPASE 101*    Recent Labs Lab 04/25/14 1305 04/18/2014 1651 04/28/14 0535  AMMONIA 36* 103* 33*   CBC:  Recent Labs Lab 04/25/14 1222 05/13/2014 1526 04/28/14 0535  WBC 7.6 7.1 5.0  NEUTROABS 5.9 5.2  --   HGB 7.7* 8.0* 7.1*  HCT 22.6* 23.6* 21.2*  MCV 94.6 94.0 94.6  PLT PLATELET CLUMPS NOTED ON SMEAR, UNABLE TO ESTIMATE 97* 87*   Cardiac Enzymes: No results for input(s): CKTOTAL, CKMB, CKMBINDEX, TROPONINI in the last 168 hours. BNP: Invalid input(s): POCBNP CBG: No results for input(s): GLUCAP in the last 168 hours.  No results found for this or any previous visit (from the past 240 hour(s)).   Scheduled Meds: . sodium chloride   Intravenous Once  . docusate sodium  100 mg Oral BID  . feeding supplement (RESOURCE BREEZE)  1 Container Oral TID BM  . iron polysaccharides  150 mg Oral Daily  . lactulose  30 g Oral TID  . magnesium oxide  200 mg Oral Daily  . pantoprazole  40 mg Oral Daily  . rifaximin  550 mg Oral BID  . [START ON 05/04/2014] Vitamin D (Ergocalciferol)  50,000 Units Oral Q7 days   Continuous Infusions:    Elenie Coven, DO  [facility] Pager [phone]  If 7PM-7AM, please contact night-coverage [email or web address] Password [email or web address] 04/28/2014, 1:46 PM   LOS: 1 day

## 2014-04-28 NOTE — Evaluation (Signed)
Physical Therapy Evaluation Patient Details Name: LEIBISH MCGREGOR MRN: [id number] DOB: December 11, 1945 Today's Date: 04/28/2014   History of Present Illness  69 yo male admitted with hyperammonemia, back/bil LE pain. Recent d/c 4/1 after having 18 teeth extracted on 3/31. Hx of aflutter, hip fx-ORIF 2015, cirrhosis  Clinical Impression  On eval, pt required Mod assist for mobility-able to ambulate ~45 feet with RW. Mobility limited by pain (back, LEs). May need to consider ST rehab placement if pain and mobility do not improve.     Follow Up Recommendations SNF vs Home health PT;Supervision/Assistance - 24 hour (depending on progress)    Equipment Recommendations  None recommended by PT    Recommendations for Other Services       Precautions / Restrictions Precautions Precautions: Fall Restrictions Weight Bearing Restrictions: No      Mobility  Bed Mobility Overal bed mobility: Needs Assistance Bed Mobility: Rolling;Sidelying to Sit;Sit to Supine Rolling: Min assist Sidelying to sit: Mod assist   Sit to supine: Mod assist   General bed mobility comments: Instructed pt in logroll technique to aid with bed mobility. Performed fairly well getting to EOB but not nearly as well getting back into bed. Assist for trunk and bil LEs. Increased time.   Transfers Overall transfer level: Needs assistance Equipment used: Rolling walker (2 wheeled) Transfers: Sit to/from Stand Sit to Stand: Min assist;From elevated surface         General transfer comment: Assist to rise, stabilize, cotnrol descent. VCS safety, technique, hand placement  Ambulation/Gait Ambulation/Gait assistance: Min assist Ambulation Distance (Feet): 45 Feet Assistive device: Rolling walker (2 wheeled) Gait Pattern/deviations: Step-through pattern;Decreased step length - right;Decreased step length - left;Decreased stride length     General Gait Details: Gait was slow and effortful. Pt reported 7/10 pain in bil LEs  and 8-9/10 pain in back/abdomen.   Stairs            Wheelchair Mobility    Modified Rankin (Stroke Patients Only)       Balance Overall balance assessment: Needs assistance         Standing balance support: Bilateral upper extremity supported;During functional activity Standing balance-Leahy Scale: Poor                               Pertinent Vitals/Pain Pain Assessment: 0-10 Pain Score: 9  Pain Location: back, bil LE pain, abdomen Pain Descriptors / Indicators: Sharp;Sore;Aching Pain Intervention(s): Monitored during session;Repositioned    Home Living Family/patient expects to be discharged to:: Private residence Living Arrangements: Spouse/significant other Available Help at Discharge: Family Type of Home: House Home Access: Ramped entrance     Home Layout: One level Home Equipment: Environmental consultant - 2 wheels;Cane - single point      Prior Function Level of Independence: Needs assistance   Gait / Transfers Assistance Needed: using  cane up until a couple of weeks ago. Now using RW           Hand Dominance        Extremity/Trunk Assessment   Upper Extremity Assessment: Generalized weakness           Lower Extremity Assessment: Generalized weakness (bil LE edema)      Cervical / Trunk Assessment: Normal  Communication   Communication: No difficulties  Cognition Arousal/Alertness: Awake/alert Behavior During Therapy: WFL for tasks assessed/performed Overall Cognitive Status: Within Functional Limits for tasks assessed  General Comments      Exercises        Assessment/Plan    PT Assessment Patient needs continued PT services  PT Diagnosis Difficulty walking;Generalized weakness;Acute pain   PT Problem List Decreased strength;Decreased activity tolerance;Decreased balance;Decreased mobility;Decreased knowledge of use of DME;Pain  PT Treatment Interventions DME instruction;Gait  training;Functional mobility training;Therapeutic activities;Patient/family education;Therapeutic exercise;Balance training   PT Goals (Current goals can be found in the Care Plan section) Acute Rehab PT Goals Patient Stated Goal: less pain.  PT Goal Formulation: With patient Time For Goal Achievement: 05/12/14 Potential to Achieve Goals: Good    Frequency Min 3X/week   Barriers to discharge        Co-evaluation               End of Session Equipment Utilized During Treatment: Gait belt Activity Tolerance: Patient limited by pain;Patient limited by fatigue Patient left: with call bell/phone within reach;in bed           Time: 1435-1456 PT Time Calculation (min) (ACUTE ONLY): 21 min   Charges:   PT Evaluation $Initial PT Evaluation Tier I: 1 Procedure     PT G Codes:        Weston Anna, MPT Pager: [phone]

## 2014-04-29 ENCOUNTER — Observation Stay (HOSPITAL_COMMUNITY): Payer: Medicare Other

## 2014-04-29 DIAGNOSIS — IMO0002 Reserved for concepts with insufficient information to code with codable children: Secondary | ICD-10-CM | POA: Diagnosis present

## 2014-04-29 LAB — CBC
HCT: 25.6 % — ABNORMAL LOW (ref 39.0–52.0)
Hemoglobin: 8.5 g/dL — ABNORMAL LOW (ref 13.0–17.0)
MCH: 31.4 pg (ref 26.0–34.0)
MCHC: 33.2 g/dL (ref 30.0–36.0)
MCV: 94.5 fL (ref 78.0–100.0)
Platelets: 95 10*3/uL — ABNORMAL LOW (ref 150–400)
RBC: 2.71 MIL/uL — ABNORMAL LOW (ref 4.22–5.81)
RDW: 19 % — ABNORMAL HIGH (ref 11.5–15.5)
WBC: 5.8 10*3/uL (ref 4.0–10.5)

## 2014-04-29 LAB — COMPREHENSIVE METABOLIC PANEL
ALT: 22 U/L (ref 0–53)
AST: 33 U/L (ref 0–37)
Albumin: 1.9 g/dL — ABNORMAL LOW (ref 3.5–5.2)
Alkaline Phosphatase: 125 U/L — ABNORMAL HIGH (ref 39–117)
Anion gap: 5 (ref 5–15)
BILIRUBIN TOTAL: 3.2 mg/dL — AB (ref 0.3–1.2)
BUN: 9 mg/dL (ref 6–23)
CALCIUM: 7.4 mg/dL — AB (ref 8.4–10.5)
CO2: 23 mmol/L (ref 19–32)
Chloride: 103 mmol/L (ref 96–112)
Creatinine, Ser: 0.74 mg/dL (ref 0.50–1.35)
GLUCOSE: 99 mg/dL (ref 70–99)
Potassium: 3.8 mmol/L (ref 3.5–5.1)
Sodium: 131 mmol/L — ABNORMAL LOW (ref 135–145)
Total Protein: 5 g/dL — ABNORMAL LOW (ref 6.0–8.3)

## 2014-04-29 LAB — GLUCOSE, SEROUS FLUID: GLUCOSE FL: 138 mg/dL

## 2014-04-29 LAB — PROTIME-INR
INR: 1.5 — AB (ref 0.00–1.49)
PROTHROMBIN TIME: 18.3 s — AB (ref 11.6–15.2)

## 2014-04-29 LAB — BODY FLUID CELL COUNT WITH DIFFERENTIAL
EOS FL: 2 %
Lymphs, Fluid: 21 %
MONOCYTE-MACROPHAGE-SEROUS FLUID: 61 % (ref 50–90)
Neutrophil Count, Fluid: 16 % (ref 0–25)
Total Nucleated Cell Count, Fluid: 52 cu mm (ref 0–1000)

## 2014-04-29 LAB — TSH: TSH: 1.829 u[IU]/mL (ref 0.350–4.500)

## 2014-04-29 LAB — PATHOLOGIST SMEAR REVIEW

## 2014-04-29 LAB — LACTIC ACID, PLASMA: Lactic Acid, Venous: 1.5 mmol/L (ref 0.5–2.0)

## 2014-04-29 LAB — AMMONIA: Ammonia: 36 umol/L — ABNORMAL HIGH (ref 11–32)

## 2014-04-29 LAB — MAGNESIUM: Magnesium: 1.9 mg/dL (ref 1.5–2.5)

## 2014-04-29 MED ORDER — ALBUMIN HUMAN 25 % IV SOLN
12.5000 g | Freq: Once | INTRAVENOUS | Status: AC
  Administered 2014-04-29: 12.5 g via INTRAVENOUS
  Filled 2014-04-29: qty 50

## 2014-04-29 MED ORDER — OXYCODONE HCL 5 MG PO TABS
5.0000 mg | ORAL_TABLET | Freq: Four times a day (QID) | ORAL | Status: DC | PRN
  Administered 2014-04-29 – 2014-04-30 (×2): 10 mg via ORAL
  Filled 2014-04-29 (×2): qty 2

## 2014-04-29 MED ORDER — MORPHINE SULFATE 2 MG/ML IJ SOLN
2.0000 mg | INTRAMUSCULAR | Status: DC | PRN
  Administered 2014-04-29 – 2014-04-30 (×4): 2 mg via INTRAVENOUS
  Filled 2014-04-29 (×4): qty 1

## 2014-04-29 NOTE — Progress Notes (Signed)
   04/28/14 1510  PT Time Calculation  PT Start Time (ACUTE ONLY) 1435  PT Stop Time (ACUTE ONLY) 1456  PT Time Calculation (min) (ACUTE ONLY) 21 min  PT G-Codes **NOT FOR INPATIENT CLASS**  Functional Assessment Tool Used (clinical judgement)  Functional Limitation Mobility: Walking and moving around  Mobility: Walking and Moving Around Current Status (C1275) CJ  Mobility: Walking and Moving Around Goal Status (T7001) CI  PT General Charges  $$ ACUTE PT VISIT 1 Procedure  PT Evaluation  $Initial PT Evaluation Tier I 1 Procedure   Weston Anna, MPT [phone]

## 2014-04-29 NOTE — Progress Notes (Addendum)
PT Cancellation Note  Patient Details Name: [facility] MRN: [id number] DOB: 1945/08/17   Cancelled Treatment:    Reason Eval/Treat Not Completed: Medical issues which prohibited therapy. MRI today + for multiple compression fractures. Spoke with MD (Tat) who recommended PT sign off until further notice/reorder-possible plan for VP/KP at some point. Will sign off.    Weston Anna, MPT Pager: [phone]

## 2014-04-29 NOTE — Progress Notes (Signed)
CARE MANAGEMENT NOTE 04/29/2014  Patient:  Cl[facility], Clifford [facility]   Account Number:  [id number]  Date Initiated:  04/28/2014  Documentation initiated by:  Clifford [facility]  Subjective/Objective Assessment:   69 yo male admitted hyperammonemia     Action/Plan:   discharge planning   Anticipated DC Date:  04/30/2014   Anticipated DC Plan:  HOME/SELF CARE         Choice offered to / List presented to:             Status of service:  In process, will continue to follow Medicare Important Message given?   (If response is "NO", the following Medicare IM given date fields will be blank) Date Medicare IM given:   Medicare IM given by:   Date Additional Medicare IM given:   Additional Medicare IM given by:    Discharge Disposition:    Per UR Regulation:    If discussed at Long Length of Stay Meetings, dates discussed:    Comments:  04/29/14 Clifford Shell RN BSn CM [phone] Patient lives at home and has walker, cane, BSC, and shower chair. He has had [place] services in the past with both [place] and [facility]. Will continue to follow for dc needs.

## 2014-04-29 NOTE — Progress Notes (Signed)
CARE MANAGEMENT NOTE 04/29/2014  Patient:  Clifford [facility], Clifford Cook   Account Number:  [id number]  Date Initiated:  04/28/2014  Documentation initiated by:  Edwyna Shell  Subjective/Objective Assessment:   69 yo male admitted hyperammonemia     Action/Plan:   discharge planning   Anticipated DC Date:  04/30/2014   Anticipated DC Plan:  [place]  CM consult      Choice offered to / List presented to:             Status of service:  In process, will continue to follow Medicare Important Message given?   (If response is "NO", the following Medicare IM given date fields will be blank) Date Medicare IM given:   Medicare IM given by:   Date Additional Medicare IM given:   Additional Medicare IM given by:    Discharge Disposition:    Per UR Regulation:    If discussed at Long Length of Stay Meetings, dates discussed:    Comments:  04/29/14 Edwyna Shell RN BSN CM [phone] Spoke with patient and he stated that he prefers [place] for [facility] services.  04/28/14 Edwyna Shell RN BSn CM [phone] Patient lives at home and has walker, cane, BSC, and shower chair. He has had [place] services in the past with both [place] and [facility]. Daughter Maudie Mercury in room with patient. Patient and daughter stated that he does not want to go to [facility] for rehab and prefders home with [facility] services. Will continue to follow for dc needs.

## 2014-04-29 NOTE — Procedures (Signed)
Ultrasound-guided diagnostic and therapeutic paracentesis performed yielding 3.1 L slightly turbid, blood-tinged/amber fluid. A portion of the fluid was submitted to the laboratory for preordered studies. No immediate complications.

## 2014-04-29 NOTE — Progress Notes (Signed)
Patient well-known to me from a history of cirrhosis complicated with portal gastropathy ascites and occasional encephalopathy who is currently an MRI for back pain and his case was discussed with his family as well as the hospital team and his hospital computer chart was reviewed and he is doing well from his oral surgery and did have a bout of encephalopathy 2 weeks ago and the family increase lactulose which helped and a repeat paracentesis yesterday has helped and his labs and hospital computer chart was reviewed and we will follow with you and recommend restarting his home dose of diuretics including Lasix and Aldactone and will ask my partner Dr. Oletta Lamas to see this weekend

## 2014-04-29 NOTE — Progress Notes (Signed)
PROGRESS NOTE  Clifford Cook UVO:[id number] DOB: Nov 02, 1945 DOA: 05/01/2014 PCP: [name], MD  Assessment/Plan: Uncontrolled low back pain with leg weakness -MR Lumbar spine--acute vs subacute compression fractures L2,L3,L5 and acute on chronic L3-4 -spoke with Dr. 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 -US paracentesis--WBC 52, not consistent with SBP -send ascites for culture--follow -hold abx as pt is afebrile and hemodynamically stable -give albumin Hyperammonemia -restart lactulose -recheck ammonia in am -continue rifaximin Hyponatremia -secondary to cirrhosis with component of volume depletion -hold diuretics today and re-eval in 24 hours Generalized weakness -multifactorial including deconditioning -PT eval--family refuses Anemia of chronic disease -4/14--transfused one unit PRBC Thrombocytopenia -Secondary to cirrhosis -Monitor for signs of bleeding Hyperglycemia -check A1C  Family Communication: Wife and daughter updated at beside Disposition Plan: Home when medically stable        Procedures/Studies: Dg Chest 2 View  04/07/2014   CLINICAL DATA:  Preoperative exam prior to dental surgery ; history of cirrhosis, NASH, and sepsis  EXAM: CHEST  2 VIEW  COMPARISON:  Portable chest x-ray of December 12, 2013  FINDINGS: The lungs are mildly hyperinflated. The interstitial markings are coarse. There is no alveolar infiltrate. There is a trace of blunting of the costophrenic angles. The heart and pulmonary vascularity are normal. The mediastinum is normal in width. The bony thorax is unremarkable.  IMPRESSION: COPD and mild pulmonary fibrotic changes. There is no CHF nor pneumonia.   Electronically Signed   By: Tennille Montelongo  [place]   On: 04/07/2014 09:07   Mr Lumbar Spine Wo Contrast  04/29/2014   CLINICAL DATA:  3-4 day history of worsening lower extremity  weakness and low back pain. History of cirrhosis.  EXAM: MRI LUMBAR SPINE WITHOUT CONTRAST  TECHNIQUE: Multiplanar, multisequence MR imaging of the lumbar spine was performed. No intravenous contrast was administered.  COMPARISON:  Plain films lumbar spine 10/27/2013. CT abdomen and pelvis 04/17/2012.  FINDINGS: Remote compression fractures of L1, L3 and L4 are identified. The patient has a new mild superior endplate compression fracture of L2 with vertebral body height loss estimated at 25% with marrow edema consistent with acute or subacute injury. Mild edema is also seen in the inferior endplates of L3 and L4 consistent with acute on chronic fractures. Mild edema within the superior endplate of L5 is also identified with minimal vertebral body height loss seen. Vertebral body alignment is maintained. The conus medullaris is normal in signal and position. Imaged intra-abdominal contents are unremarkable.  The T12-L1 level is imaged in the sagittal plane only and negative.  T12-L1:  Negative.  L1-2: Shallow disc bulge and mild facet arthropathy without central canal or foraminal stenosis.  L2-3: Small right paracentral protrusion is identified. The central canal remains open. The foramina are mildly narrowed.  L3-4: There is a shallow broad-based disc bulge and ligamentum flavum thickening. Facet degenerative disease appears worse on the left. Mild bony retropulsion is seen off the superior endplate of L4. There is moderate to moderately severe central canal stenosis at this level.  L4-5: Shallow broad-based disc bulge and ligamentum flavum thickening are seen. Epidural fat is prominent and results in crowding of descending nerve roots. Right much worse than left foraminal narrowing is identified.  L5-S1: Endplate spurring in the paravertebral space is identified. The thecal sac and foramina are open.  IMPRESSION: Acute or subacute compression fractures of  L2, L3, L4 and L5. Marrow edema appears most intense in L2.  Fractures in L3 and L4 are acute on chronic. The fractures cannot be definitively characterized but have an appearance most consistent with senile osteoporotic injuries.  Spondylosis as described above appears most notable at L3-4 and L4-5.   Electronically Signed   By: Inge Rise M.D.   On: 04/29/2014 12:30   US Paracentesis  04/29/2014   INDICATION: Cirrhosis, recurrent ascites. Request is made for diagnostic and therapeutic paracentesis.  EXAM: ULTRASOUND-GUIDED DIAGNOSTIC AND THERAPEUTIC PARACENTESIS  COMPARISON:  Prior paracentesis on 03/17/2014  MEDICATIONS: None.  COMPLICATIONS: None immediate  TECHNIQUE: Informed written consent was obtained from the patient after a discussion of the risks, benefits and alternatives to treatment. A timeout was performed prior to the initiation of the procedure.  Initial ultrasound scanning demonstrates a moderate amount of ascites within the left lower abdominal quadrant. The left lower abdomen was prepped and draped in the usual sterile fashion. 1% lidocaine was used for local anesthesia. Under direct ultrasound guidance, a 19 gauge, 10-cm, Yueh catheter was introduced. An ultrasound image was saved for documentation purposed. The paracentesis was performed. The catheter was removed and a dressing was applied. The patient tolerated the procedure well without immediate post procedural complication.  FINDINGS: A total of approximately 3.1 liters of slightly turbid, blood-tinged/amber fluid was removed. Samples were sent to the laboratory as requested by the clinical team.  IMPRESSION: Successful ultrasound-guided diagnostic and therapeutic paracentesis yielding 3.1 liters of peritoneal fluid.  Read by: Rowe Robert, PA-C   Electronically Signed   By: Sandi Mariscal M.D.   On: 04/29/2014 11:18         Subjective: Patient complains of dysuria and back pain. Denies any chest pain, shortness breath, nausea, vomiting, diarrhea, dysuria, hematuria. No focal lower  extremity weakness.  Objective: Filed Vitals:   04/29/14 0921 04/29/14 0956 04/29/14 1009 04/29/14 1531  BP: 95/62 109/65 106/63 103/53  Pulse:    97  Temp:    98.5 F (36.9 C)  TempSrc:    Oral  Resp:    18  Height:      SpO2:    92%    Intake/Output Summary (Last 24 hours) at 04/29/14 1543 Last data filed at 04/29/14 1352  Gross per 24 hour  Intake    240 ml  Output    750 ml  Net   -510 ml   Weight change:  Exam:   General:  Pt is alert, follows commands appropriately, not in acute distress  HEENT: No icterus, No thrush, No neck mass, [place]/AT  Cardiovascular: RRR, S1/S2, no rubs, no gallops  Respiratory: Bibasilar crackles. No wheezing. Good air movement.alb  Abdomen: Soft/+BS, non tender, non distended, no guarding  Extremities: No edema, No lymphangitis, No petechiae, No rashes, no synovitis  Data Reviewed: Basic Metabolic Panel:  Recent Labs Lab 04/25/14 1222 04/26/2014 1526 04/28/14 0535 04/29/14 0513  NA 130* 130* 132* 131*  K 3.8 4.1 3.8 3.8  CL 96 100 103 103  CO2 24 23 23 23   GLUCOSE 144* 164* 96 99  BUN 15 18 14 9   CREATININE 1.05 1.07 0.83 0.74  CALCIUM 7.6* 7.4* 7.3* 7.4*  MG  --   --   --  1.9   Liver Function Tests:  Recent Labs Lab 04/25/14 1222 05/05/2014 1526 04/28/14 0535 04/29/14 0513  AST 36 37 27 33  ALT 24 23 19 22   ALKPHOS 148* 160* 123* 125*  BILITOT 3.1*  2.9* 2.6* 3.2*  PROT 5.4* 5.4* 4.8* 5.0*  ALBUMIN 2.3* 2.3* 1.9* 1.9*    Recent Labs Lab 04/25/14 1222  LIPASE 101*    Recent Labs Lab 04/25/14 1305 04/16/2014 1651 04/28/14 0535 04/29/14 0513  AMMONIA 36* 103* 33* 36*   CBC:  Recent Labs Lab 04/25/14 1222 04/24/2014 1526 04/28/14 0535 04/29/14 0513  WBC 7.6 7.1 5.0 5.8  NEUTROABS 5.9 5.2  --   --   HGB 7.7* 8.0* 7.1* 8.5*  HCT 22.6* 23.6* 21.2* 25.6*  MCV 94.6 94.0 94.6 94.5  PLT PLATELET CLUMPS NOTED ON SMEAR, UNABLE TO ESTIMATE 97* 87* 95*   Cardiac Enzymes: No results for input(s): CKTOTAL,  CKMB, CKMBINDEX, TROPONINI in the last 168 hours. BNP: Invalid input(s): POCBNP CBG: No results for input(s): GLUCAP in the last 168 hours.  No results found for this or any previous visit (from the past 240 hour(s)).   Scheduled Meds: . sodium chloride   Intravenous Once  . docusate sodium  100 mg Oral BID  . feeding supplement (RESOURCE BREEZE)  1 Container Oral TID BM  . iron polysaccharides  150 mg Oral Daily  . lactulose  30 g Oral TID  . magnesium oxide  200 mg Oral Daily  . pantoprazole  40 mg Oral Daily  . rifaximin  550 mg Oral BID  . [START ON 05/04/2014] Vitamin D (Ergocalciferol)  50,000 Units Oral Q7 days   Continuous Infusions:    Tyyonna Soucy, DO  [facility] Pager [phone]  If 7PM-7AM, please contact night-coverage [email or web address] Password [facility] 04/29/2014, 3:43 PM   LOS: 2 days

## 2014-04-29 NOTE — Progress Notes (Signed)
POST OPERATIVE NOTE:  04/29/2014   Clifford Cook [id number]  VITALS: BP 106/63 mmHg  Pulse 97  Temp(Src) 98.2 F (36.8 C) (Oral)  Resp 18  Ht 5\' 8"  (1.727 m)  SpO2 96%  LABS:  Lab Results  Component Value Date   WBC 5.8 04/29/2014   HGB 8.5* 04/29/2014   HCT 25.6* 04/29/2014   MCV 94.5 04/29/2014   PLT 95* 04/29/2014   BMET    Component Value Date/Time   NA 131* 04/29/2014 0513   K 3.8 04/29/2014 0513   CL 103 04/29/2014 0513   CO2 23 04/29/2014 0513   GLUCOSE 99 04/29/2014 0513   BUN 9 04/29/2014 0513   CREATININE 0.74 04/29/2014 0513   CALCIUM 7.4* 04/29/2014 0513   GFRNONAA >90 04/29/2014 0513   GFRAA >90 04/29/2014 0513    Lab Results  Component Value Date   INR 1.50* 04/29/2014   INR 1.48 04/07/2014   INR 1.78* 03/20/2014   No results found for: PTT   Clifford Cook is status post multiple extractions with alveoloplasty in the operating room on 04/14/2014. Patient was scheduled for evaluation of healing and suture removal on 04/26/2014 was unable to keep that appointment. Patient was subsequently admitted this week at [facility].  SUBJECTIVE: The patient denies any active dental pain. Patient indicates that he "didn't have a lick of pain".  Patient has been able to eat soft foods since the dental extractions. Patient did have some sinus congestion last week but is not complaining of sinus problems at this time.  EXAM: There is no sign of infection heme or ooze. No sutures remain. Patient is healing in by generalized primary closure but the upper right quadrant is healing in by secondary intention. Areas no obvious oral antral fistula or sinus exposure noted. We will need to monitor upper right and upper left quadrants for presentation of symptoms.  ASSESSMENT: Post operative course is consistent with dental procedures performed in the OR. The patient is edentulous.  PLAN: 1. Continue salt water rinses as needed to aid healing. 2. Advance  diet as tolerated. 3. Return to dental clinic in approximately one to 2 weeks for further evaluation of healing. Call if problems arise before then.   Lenn Cal, DDS

## 2014-04-29 NOTE — Progress Notes (Signed)
UR completed 

## 2014-04-30 LAB — URINALYSIS, ROUTINE W REFLEX MICROSCOPIC
BILIRUBIN URINE: NEGATIVE
Glucose, UA: NEGATIVE mg/dL
Ketones, ur: NEGATIVE mg/dL
Nitrite: NEGATIVE
PROTEIN: NEGATIVE mg/dL
Specific Gravity, Urine: 1.021 (ref 1.005–1.030)
UROBILINOGEN UA: 0.2 mg/dL (ref 0.0–1.0)
pH: 6 (ref 5.0–8.0)

## 2014-04-30 LAB — HEMOGLOBIN A1C
HEMOGLOBIN A1C: 5.1 % (ref 4.8–5.6)
Mean Plasma Glucose: 100 mg/dL

## 2014-04-30 LAB — URINE MICROSCOPIC-ADD ON

## 2014-04-30 MED ORDER — SPIRONOLACTONE 100 MG PO TABS
100.0000 mg | ORAL_TABLET | Freq: Every day | ORAL | Status: DC
  Administered 2014-04-30: 100 mg via ORAL
  Filled 2014-04-30 (×2): qty 1

## 2014-04-30 MED ORDER — MORPHINE SULFATE 4 MG/ML IJ SOLN
4.0000 mg | INTRAMUSCULAR | Status: DC | PRN
  Administered 2014-04-30 – 2014-05-01 (×3): 4 mg via INTRAVENOUS
  Filled 2014-04-30 (×3): qty 1

## 2014-04-30 MED ORDER — FUROSEMIDE 40 MG PO TABS
40.0000 mg | ORAL_TABLET | Freq: Two times a day (BID) | ORAL | Status: DC
  Administered 2014-04-30 – 2014-05-01 (×2): 40 mg via ORAL
  Filled 2014-04-30 (×4): qty 1

## 2014-04-30 NOTE — Progress Notes (Signed)
UR completed 

## 2014-04-30 NOTE — Progress Notes (Signed)
[facility] PROGRESS NOTE Subjective Pt eating no abd pain but c/o back pain  Objective: Vital signs in last 24 hours: Temp:  [98.5 F (36.9 C)-98.9 F (37.2 C)] 98.6 F (37 C) (04/16 0549) Pulse Rate:  [92-103] 92 (04/16 0549) Resp:  [18-20] 20 (04/16 0549) BP: (95-121)/(53-65) 113/55 mmHg (04/16 0549) SpO2:  [92 %-96 %] 96 % (04/16 0549) Weight:  [88.33 kg (194 lb 11.7 oz)] 88.33 kg (194 lb 11.7 oz) (04/15 1708) Last BM Date: 04/29/14  Intake/Output from previous day: 04/15 0701 - 04/16 0700 In: 240 [P.O.:240] Out: 400 [Urine:400] Intake/Output this shift:    PE: General--eating, alert and oriented  Abdomen--some fluid, good BSs nontender  Lab Results:  Recent Labs  04/28/2014 1526 04/28/14 0535 04/29/14 0513  WBC 7.1 5.0 5.8  HGB 8.0* 7.1* 8.5*  HCT 23.6* 21.2* 25.6*  PLT 97* 87* 95*   BMET  Recent Labs  04/16/2014 1526 04/28/14 0535 04/29/14 0513  NA 130* 132* 131*  K 4.1 3.8 3.8  CL 100 103 103  CO2 23 23 23   CREATININE 1.07 0.83 0.74   LFT  Recent Labs  04/28/2014 1526 04/28/14 0535 04/29/14 0513  PROT 5.4* 4.8* 5.0*  AST 37 27 33  ALT 23 19 22   ALKPHOS 160* 123* 125*  BILITOT 2.9* 2.6* 3.2*   PT/INR  Recent Labs  04/29/14 0513  LABPROT 18.3*  INR 1.50*   PANCREAS No results for input(s): LIPASE in the last 72 hours.       Studies/Results: Mr Lumbar Spine Wo Contrast  04/29/2014   CLINICAL DATA:  3-4 day history of worsening lower extremity weakness and low back pain. History of cirrhosis.  EXAM: MRI LUMBAR SPINE WITHOUT CONTRAST  TECHNIQUE: Multiplanar, multisequence MR imaging of the lumbar spine was performed. No intravenous contrast was administered.  COMPARISON:  Plain films lumbar spine 10/27/2013. CT abdomen and pelvis 04/17/2012.  FINDINGS: Remote compression fractures of L1, L3 and L4 are identified. The patient has a new mild superior endplate compression fracture of L2 with vertebral body height loss estimated  at 25% with marrow edema consistent with acute or subacute injury. Mild edema is also seen in the inferior endplates of L3 and L4 consistent with acute on chronic fractures. Mild edema within the superior endplate of L5 is also identified with minimal vertebral body height loss seen. Vertebral body alignment is maintained. The conus medullaris is normal in signal and position. Imaged intra-abdominal contents are unremarkable.  The T12-L1 level is imaged in the sagittal plane only and negative.  T12-L1:  Negative.  L1-2: Shallow disc bulge and mild facet arthropathy without central canal or foraminal stenosis.  L2-3: Small right paracentral protrusion is identified. The central canal remains open. The foramina are mildly narrowed.  L3-4: There is a shallow broad-based disc bulge and ligamentum flavum thickening. Facet degenerative disease appears worse on the left. Mild bony retropulsion is seen off the superior endplate of L4. There is moderate to moderately severe central canal stenosis at this level.  L4-5: Shallow broad-based disc bulge and ligamentum flavum thickening are seen. Epidural fat is prominent and results in crowding of descending nerve roots. Right much worse than left foraminal narrowing is identified.  L5-S1: Endplate spurring in the paravertebral space is identified. The thecal sac and foramina are open.  IMPRESSION: Acute or subacute compression fractures of L2, L3, L4 and L5. Marrow edema appears most intense in L2. Fractures in L3 and L4 are acute on chronic. The fractures cannot  be definitively characterized but have an appearance most consistent with senile osteoporotic injuries.  Spondylosis as described above appears most notable at L3-4 and L4-5.   Electronically Signed   By: Inge Rise M.D.   On: 04/29/2014 12:30   US Paracentesis  04/29/2014   INDICATION: Cirrhosis, recurrent ascites. Request is made for diagnostic and therapeutic paracentesis.  EXAM: ULTRASOUND-GUIDED DIAGNOSTIC  AND THERAPEUTIC PARACENTESIS  COMPARISON:  Prior paracentesis on 03/17/2014  MEDICATIONS: None.  COMPLICATIONS: None immediate  TECHNIQUE: Informed written consent was obtained from the patient after a discussion of the risks, benefits and alternatives to treatment. A timeout was performed prior to the initiation of the procedure.  Initial ultrasound scanning demonstrates a moderate amount of ascites within the left lower abdominal quadrant. The left lower abdomen was prepped and draped in the usual sterile fashion. 1% lidocaine was used for local anesthesia. Under direct ultrasound guidance, a 19 gauge, 10-cm, Yueh catheter was introduced. An ultrasound image was saved for documentation purposed. The paracentesis was performed. The catheter was removed and a dressing was applied. The patient tolerated the procedure well without immediate post procedural complication.  FINDINGS: A total of approximately 3.1 liters of slightly turbid, blood-tinged/amber fluid was removed. Samples were sent to the laboratory as requested by the clinical team.  IMPRESSION: Successful ultrasound-guided diagnostic and therapeutic paracentesis yielding 3.1 liters of peritoneal fluid.  Read by: Rowe Robert, PA-C   Electronically Signed   By: Sandi Mariscal M.D.   On: 04/29/2014 11:18    Medications: I have reviewed the patient's current medications.  Assessment/Plan: 1. Cirrhosis with ascites/encephalopathy. Pt on xifaxan and lactulose and doesn't appear encephalopathic. WBC on ascitic fluid 52 doubt SBP Pt to have procedure on back Monday We will follow Q2days.   Dragan Tamburrino JR,Dunbar Buras L 04/30/2014, 9:04 AM  Diet:  No Change Anticoagulation: Follow-up Appointment:

## 2014-04-30 NOTE — Progress Notes (Signed)
PROGRESS NOTE  Clifford Cook ERX:[id number] DOB: November 01, 1945 DOA: 05/14/2014 PCP: [name], MD  Assessment/Plan:  Uncontrolled low back pain with leg weakness/Lumbar compression fractures -MR Lumbar spine--acute vs subacute compression fractures L2,L3,L5 and acute on chronic L3-4 -spoke with Dr. 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 -04/29/2014--paracentesis--WBC 52, not consistent with SBP--3.1 L removed  -send ascites for culture--follow -hold abx as pt is afebrile and hemodynamically stable -given albumin IV after paracentesis -family request nutrition eval -Restart diuretics Hyperammonemia -restart lactulose -recheck ammonia-->36 -continue rifaximin -add senna Hyponatremia -secondary to cirrhosis with component of volume depletion -Restart diuretics Generalized weakness -multifactorial including deconditioning, pain and mutitple co-morbidities -PT eval--family refuses Anemia of chronic disease -4/14--transfused one unit PRBC Thrombocytopenia -Secondary to cirrhosis -Monitor for signs of bleeding Hyperglycemia -check A1C--5.1  Family Communication: Wife and daughter updated at beside--total time 35 min; >50% spent counseling patient and coordinating care Disposition Plan: Home when medically stable      Procedures/Studies: Dg Chest 2 View  04/07/2014   CLINICAL DATA:  Preoperative exam prior to dental surgery ; history of cirrhosis, NASH, and sepsis  EXAM: CHEST  2 VIEW  COMPARISON:  Portable chest x-ray of December 12, 2013  FINDINGS: The lungs are mildly hyperinflated. The interstitial markings are coarse. There is no alveolar infiltrate. There is a trace of blunting of the costophrenic angles. The heart and pulmonary  vascularity are normal. The mediastinum is normal in width. The bony thorax is unremarkable.  IMPRESSION: COPD and mild pulmonary fibrotic changes. There is no CHF nor pneumonia.   Electronically Signed   By: Sheronda Parran  [place]   On: 04/07/2014 09:07   Mr Lumbar Spine Wo Contrast  04/29/2014   CLINICAL DATA:  3-4 day history of worsening lower extremity weakness and low back pain. History of cirrhosis.  EXAM: MRI LUMBAR SPINE WITHOUT CONTRAST  TECHNIQUE: Multiplanar, multisequence MR imaging of the lumbar spine was performed. No intravenous contrast was administered.  COMPARISON:  Plain films lumbar spine 10/27/2013. CT abdomen and pelvis 04/17/2012.  FINDINGS: Remote compression fractures of L1, L3 and L4 are identified. The patient has a new mild superior endplate compression fracture of L2 with vertebral body height loss estimated at 25% with marrow edema consistent with acute or subacute injury. Mild edema is also seen in the inferior endplates of L3 and L4 consistent with acute on chronic fractures. Mild edema within the superior endplate of L5 is also identified with minimal vertebral body height loss seen. Vertebral body alignment is maintained. The conus medullaris is normal in signal and position. Imaged intra-abdominal contents are unremarkable.  The T12-L1 level is imaged in the sagittal plane only and negative.  T12-L1:  Negative.  L1-2: Shallow disc bulge and mild facet arthropathy without central canal or foraminal stenosis.  L2-3: Small right paracentral protrusion is identified. The central canal remains open. The foramina are mildly narrowed.  L3-4: There is a shallow broad-based disc bulge and ligamentum flavum thickening. Facet degenerative disease appears worse on the left. Mild bony retropulsion is seen off the superior endplate of L4. There is moderate to moderately severe central canal stenosis at this level.  L4-5: Shallow broad-based disc bulge and ligamentum flavum thickening are seen.  Epidural fat  is prominent and results in crowding of descending nerve roots. Right much worse than left foraminal narrowing is identified.  L5-S1: Endplate spurring in the paravertebral space is identified. The thecal sac and foramina are open.  IMPRESSION: Acute or subacute compression fractures of L2, L3, L4 and L5. Marrow edema appears most intense in L2. Fractures in L3 and L4 are acute on chronic. The fractures cannot be definitively characterized but have an appearance most consistent with senile osteoporotic injuries.  Spondylosis as described above appears most notable at L3-4 and L4-5.   Electronically Signed   By: Inge Rise M.D.   On: 04/29/2014 12:30   US Paracentesis  04/29/2014   INDICATION: Cirrhosis, recurrent ascites. Request is made for diagnostic and therapeutic paracentesis.  EXAM: ULTRASOUND-GUIDED DIAGNOSTIC AND THERAPEUTIC PARACENTESIS  COMPARISON:  Prior paracentesis on 03/17/2014  MEDICATIONS: None.  COMPLICATIONS: None immediate  TECHNIQUE: Informed written consent was obtained from the patient after a discussion of the risks, benefits and alternatives to treatment. A timeout was performed prior to the initiation of the procedure.  Initial ultrasound scanning demonstrates a moderate amount of ascites within the left lower abdominal quadrant. The left lower abdomen was prepped and draped in the usual sterile fashion. 1% lidocaine was used for local anesthesia. Under direct ultrasound guidance, a 19 gauge, 10-cm, Yueh catheter was introduced. An ultrasound image was saved for documentation purposed. The paracentesis was performed. The catheter was removed and a dressing was applied. The patient tolerated the procedure well without immediate post procedural complication.  FINDINGS: A total of approximately 3.1 liters of slightly turbid, blood-tinged/amber fluid was removed. Samples were sent to the laboratory as requested by the clinical team.  IMPRESSION: Successful ultrasound-guided  diagnostic and therapeutic paracentesis yielding 3.1 liters of peritoneal fluid.  Read by: Rowe Robert, PA-C   Electronically Signed   By: Sandi Mariscal M.D.   On: 04/29/2014 11:18        Subjective: Persistent his back pain is not controlled. He denies any nausea, vomiting, diarrhea, hematochezia, melena. Denies any fevers or headache.  Objective: Filed Vitals:   04/29/14 1708 04/29/14 1739 04/29/14 2146 04/30/14 0549  BP: 115/60 121/59 112/57 113/55  Pulse: 102 103 95 92  Temp: 98.9 F (37.2 C) 98.8 F (37.1 C) 98.6 F (37 C) 98.6 F (37 C)  TempSrc:   Oral Oral  Resp: 20 20 20 20   Height: 5\' 8"  (1.727 m)     Weight: 88.33 kg (194 lb 11.7 oz)     SpO2: 94% 95% 94% 96%    Intake/Output Summary (Last 24 hours) at 04/30/14 1347 Last data filed at 04/30/14 0811  Gross per 24 hour  Intake    240 ml  Output    700 ml  Net   -460 ml   Weight change:  Exam:   General:  Pt is alert, follows commands appropriately, not in acute distress  HEENT: No icterus, No thrush, Vander/AT  Cardiovascular: RRR, S1/S2, no rubs, no gallops  Respiratory: Diminished breath sounds at bases. No wheezing. Good air movement.  Abdomen: Soft/+BS, non tender, non distended, no guarding  Extremities: 1+LE edema, No lymphangitis, No petechiae, No rashes, no synovitis Neuro:  CN II-XII intact, strength 4/5 in RUE, RLE, strength 4/5 LUE, LLE; sensation intact bilateral; no dysmetria;   Data Reviewed: Basic Metabolic Panel:  Recent Labs Lab 04/25/14 1222 04/22/2014 1526 04/28/14 0535 04/29/14 0513  NA 130* 130* 132* 131*  K 3.8 4.1 3.8 3.8  CL 96 100  103 103  CO2 24 23 23 23   GLUCOSE 144* 164* 96 99  BUN 15 18 14 9   CREATININE 1.05 1.07 0.83 0.74  CALCIUM 7.6* 7.4* 7.3* 7.4*  MG  --   --   --  1.9   Liver Function Tests:  Recent Labs Lab 04/25/14 1222 04/20/2014 1526 04/28/14 0535 04/29/14 0513  AST 36 37 27 33  ALT 24 23 19 22   ALKPHOS 148* 160* 123* 125*  BILITOT 3.1* 2.9* 2.6*  3.2*  PROT 5.4* 5.4* 4.8* 5.0*  ALBUMIN 2.3* 2.3* 1.9* 1.9*    Recent Labs Lab 04/25/14 1222  LIPASE 101*    Recent Labs Lab 04/25/14 1305 04/29/2014 1651 04/28/14 0535 04/29/14 0513  AMMONIA 36* 103* 33* 36*   CBC:  Recent Labs Lab 04/25/14 1222 05/11/2014 1526 04/28/14 0535 04/29/14 0513  WBC 7.6 7.1 5.0 5.8  NEUTROABS 5.9 5.2  --   --   HGB 7.7* 8.0* 7.1* 8.5*  HCT 22.6* 23.6* 21.2* 25.6*  MCV 94.6 94.0 94.6 94.5  PLT PLATELET CLUMPS NOTED ON SMEAR, UNABLE TO ESTIMATE 97* 87* 95*   Cardiac Enzymes: No results for input(s): CKTOTAL, CKMB, CKMBINDEX, TROPONINI in the last 168 hours. BNP: Invalid input(s): POCBNP CBG: No results for input(s): GLUCAP in the last 168 hours.  No results found for this or any previous visit (from the past 240 hour(s)).   Scheduled Meds: . sodium chloride   Intravenous Once  . docusate sodium  100 mg Oral BID  . feeding supplement (RESOURCE BREEZE)  1 Container Oral TID BM  . furosemide  40 mg Oral BID  . iron polysaccharides  150 mg Oral Daily  . lactulose  30 g Oral TID  . magnesium oxide  200 mg Oral Daily  . pantoprazole  40 mg Oral Daily  . rifaximin  550 mg Oral BID  . spironolactone  100 mg Oral Daily  . [START ON 05/04/2014] Vitamin D (Ergocalciferol)  50,000 Units Oral Q7 days   Continuous Infusions:    Loras Grieshop, DO  [facility] Pager [phone]  If 7PM-7AM, please contact night-coverage [email or web address] Password [email or web address] 04/30/2014, 1:47 PM   LOS: 3 days

## 2014-05-01 ENCOUNTER — Inpatient Hospital Stay (HOSPITAL_COMMUNITY): Payer: Medicare Other | Admitting: Registered Nurse

## 2014-05-01 ENCOUNTER — Observation Stay (HOSPITAL_COMMUNITY): Payer: Medicare Other

## 2014-05-01 ENCOUNTER — Encounter (HOSPITAL_COMMUNITY): Admission: EM | Disposition: E | Payer: Self-pay | Source: Home / Self Care | Attending: Pulmonary Disease

## 2014-05-01 ENCOUNTER — Encounter (HOSPITAL_COMMUNITY): Payer: Self-pay | Admitting: Radiology

## 2014-05-01 DIAGNOSIS — K567 Ileus, unspecified: Secondary | ICD-10-CM

## 2014-05-01 DIAGNOSIS — K7290 Hepatic failure, unspecified without coma: Secondary | ICD-10-CM | POA: Diagnosis present

## 2014-05-01 DIAGNOSIS — J189 Pneumonia, unspecified organism: Secondary | ICD-10-CM | POA: Diagnosis present

## 2014-05-01 DIAGNOSIS — K7682 Hepatic encephalopathy: Secondary | ICD-10-CM | POA: Diagnosis present

## 2014-05-01 HISTORY — PX: LAPAROSCOPIC LYSIS OF ADHESIONS: SHX5905

## 2014-05-01 HISTORY — PX: LAPAROTOMY: SHX154

## 2014-05-01 HISTORY — PX: PARACENTESIS: SHX5436

## 2014-05-01 LAB — COMPREHENSIVE METABOLIC PANEL
ALBUMIN: 2 g/dL — AB (ref 3.5–5.2)
ALT: 23 U/L (ref 0–53)
ANION GAP: 5 (ref 5–15)
AST: 36 U/L (ref 0–37)
Alkaline Phosphatase: 146 U/L — ABNORMAL HIGH (ref 39–117)
BUN: 8 mg/dL (ref 6–23)
CHLORIDE: 100 mmol/L (ref 96–112)
CO2: 24 mmol/L (ref 19–32)
Calcium: 7.7 mg/dL — ABNORMAL LOW (ref 8.4–10.5)
Creatinine, Ser: 0.72 mg/dL (ref 0.50–1.35)
GLUCOSE: 117 mg/dL — AB (ref 70–99)
POTASSIUM: 4.2 mmol/L (ref 3.5–5.1)
Sodium: 129 mmol/L — ABNORMAL LOW (ref 135–145)
Total Bilirubin: 3.9 mg/dL — ABNORMAL HIGH (ref 0.3–1.2)
Total Protein: 5.4 g/dL — ABNORMAL LOW (ref 6.0–8.3)

## 2014-05-01 LAB — LACTIC ACID, PLASMA
LACTIC ACID, VENOUS: 2.7 mmol/L — AB (ref 0.5–2.0)
LACTIC ACID, VENOUS: 3.5 mmol/L — AB (ref 0.5–2.0)

## 2014-05-01 LAB — PROTIME-INR
INR: 1.51 — ABNORMAL HIGH (ref 0.00–1.49)
PROTHROMBIN TIME: 18.3 s — AB (ref 11.6–15.2)

## 2014-05-01 LAB — CBC
HCT: 29.3 % — ABNORMAL LOW (ref 39.0–52.0)
Hemoglobin: 9.7 g/dL — ABNORMAL LOW (ref 13.0–17.0)
MCH: 31.5 pg (ref 26.0–34.0)
MCHC: 33.1 g/dL (ref 30.0–36.0)
MCV: 95.1 fL (ref 78.0–100.0)
Platelets: 132 10*3/uL — ABNORMAL LOW (ref 150–400)
RBC: 3.08 MIL/uL — AB (ref 4.22–5.81)
RDW: 18.2 % — ABNORMAL HIGH (ref 11.5–15.5)
WBC: 10.7 10*3/uL — ABNORMAL HIGH (ref 4.0–10.5)

## 2014-05-01 LAB — BLOOD GAS, ARTERIAL
Acid-base deficit: 14.8 mmol/L — ABNORMAL HIGH (ref 0.0–2.0)
Bicarbonate: 12.3 mEq/L — ABNORMAL LOW (ref 20.0–24.0)
O2 Saturation: 99.6 %
PCO2 ART: 34.5 mmHg — AB (ref 35.0–45.0)
Patient temperature: 98.6
TCO2: 12 mmol/L (ref 0–100)
pH, Arterial: 7.178 — CL (ref 7.350–7.450)
pO2, Arterial: 270 mmHg — ABNORMAL HIGH (ref 80.0–100.0)

## 2014-05-01 SURGERY — LAPAROTOMY, EXPLORATORY
Anesthesia: General

## 2014-05-01 MED ORDER — SODIUM BICARBONATE 8.4 % IV SOLN
INTRAVENOUS | Status: DC | PRN
  Administered 2014-05-01: 50 meq via INTRAVENOUS

## 2014-05-01 MED ORDER — MAGIC MOUTHWASH
15.0000 mL | Freq: Four times a day (QID) | ORAL | Status: DC | PRN
  Administered 2014-05-07 – 2014-05-08 (×2): 15 mL via ORAL
  Filled 2014-05-01 (×3): qty 15

## 2014-05-01 MED ORDER — SUCCINYLCHOLINE CHLORIDE 20 MG/ML IJ SOLN
INTRAMUSCULAR | Status: DC | PRN
  Administered 2014-05-01: 100 mg via INTRAVENOUS

## 2014-05-01 MED ORDER — SIMETHICONE 80 MG PO CHEW
160.0000 mg | CHEWABLE_TABLET | Freq: Four times a day (QID) | ORAL | Status: DC | PRN
  Administered 2014-05-01: 160 mg via ORAL
  Filled 2014-05-01 (×2): qty 2

## 2014-05-01 MED ORDER — PANTOPRAZOLE SODIUM 40 MG IV SOLR
40.0000 mg | Freq: Every day | INTRAVENOUS | Status: DC
  Administered 2014-05-01 – 2014-05-09 (×9): 40 mg via INTRAVENOUS
  Filled 2014-05-01 (×9): qty 40

## 2014-05-01 MED ORDER — ACETAMINOPHEN 650 MG RE SUPP: 650.0000 mg | Freq: Four times a day (QID) | RECTAL | Status: DC | PRN

## 2014-05-01 MED ORDER — ROCURONIUM BROMIDE 100 MG/10ML IV SOLN
INTRAVENOUS | Status: DC | PRN
  Administered 2014-05-01: 60 mg via INTRAVENOUS
  Administered 2014-05-01: 40 mg via INTRAVENOUS

## 2014-05-01 MED ORDER — VANCOMYCIN HCL 10 G IV SOLR
2000.0000 mg | Freq: Once | INTRAVENOUS | Status: AC
  Administered 2014-05-01: 2000 mg via INTRAVENOUS
  Filled 2014-05-01: qty 2000

## 2014-05-01 MED ORDER — ONDANSETRON HCL 4 MG/2ML IJ SOLN
INTRAMUSCULAR | Status: AC
  Filled 2014-05-01: qty 2

## 2014-05-01 MED ORDER — VANCOMYCIN HCL IN DEXTROSE 1-5 GM/200ML-% IV SOLN
1000.0000 mg | Freq: Two times a day (BID) | INTRAVENOUS | Status: DC
  Administered 2014-05-02 – 2014-05-03 (×4): 1000 mg via INTRAVENOUS
  Filled 2014-05-01 (×6): qty 200

## 2014-05-01 MED ORDER — PHENOL 1.4 % MT LIQD
2.0000 | OROMUCOSAL | Status: DC | PRN
  Filled 2014-05-01: qty 177

## 2014-05-01 MED ORDER — IOHEXOL 300 MG/ML  SOLN
100.0000 mL | Freq: Once | INTRAMUSCULAR | Status: AC | PRN
  Administered 2014-05-01: 100 mL via INTRAVENOUS

## 2014-05-01 MED ORDER — SODIUM CHLORIDE 0.9 % IV BOLUS (SEPSIS): 1000.0000 mL | Freq: Once | INTRAVENOUS | Status: DC

## 2014-05-01 MED ORDER — LIP MEDEX EX OINT
1.0000 "application " | TOPICAL_OINTMENT | Freq: Two times a day (BID) | CUTANEOUS | Status: DC
  Administered 2014-05-01 – 2014-05-10 (×18): 1 via TOPICAL
  Filled 2014-05-01 (×3): qty 7

## 2014-05-01 MED ORDER — ETOMIDATE 2 MG/ML IV SOLN
INTRAVENOUS | Status: DC | PRN
  Administered 2014-05-01: 10 mg via INTRAVENOUS

## 2014-05-01 MED ORDER — 0.9 % SODIUM CHLORIDE (POUR BTL) OPTIME
TOPICAL | Status: DC | PRN
  Administered 2014-05-01 (×2): 1000 mL

## 2014-05-01 MED ORDER — SODIUM CHLORIDE 0.9 % IV SOLN: 500.0000 mg | Freq: Three times a day (TID) | INTRAVENOUS | Status: DC

## 2014-05-01 MED ORDER — HYDROMORPHONE HCL 1 MG/ML IJ SOLN
1.0000 mg | INTRAMUSCULAR | Status: DC | PRN
  Administered 2014-05-01: 1 mg via INTRAVENOUS
  Filled 2014-05-01: qty 1

## 2014-05-01 MED ORDER — FENTANYL CITRATE (PF) 100 MCG/2ML IJ SOLN
INTRAMUSCULAR | Status: DC | PRN
  Administered 2014-05-01 (×5): 50 ug via INTRAVENOUS

## 2014-05-01 MED ORDER — BISACODYL 10 MG RE SUPP: 10.0000 mg | Freq: Two times a day (BID) | RECTAL | Status: DC | PRN

## 2014-05-01 MED ORDER — SODIUM CHLORIDE 0.9 % IV SOLN
500.0000 mg | Freq: Four times a day (QID) | INTRAVENOUS | Status: DC
  Administered 2014-05-01 – 2014-05-03 (×8): 500 mg via INTRAVENOUS
  Filled 2014-05-01 (×12): qty 500

## 2014-05-01 MED ORDER — MIDAZOLAM HCL 2 MG/2ML IJ SOLN
INTRAMUSCULAR | Status: AC
  Filled 2014-05-01: qty 2

## 2014-05-01 MED ORDER — MIDAZOLAM HCL 5 MG/5ML IJ SOLN
INTRAMUSCULAR | Status: DC | PRN
  Administered 2014-05-01 (×2): 1 mg via INTRAVENOUS

## 2014-05-01 MED ORDER — SODIUM BICARBONATE 8.4 % IV SOLN
INTRAVENOUS | Status: AC
  Filled 2014-05-01: qty 50

## 2014-05-01 MED ORDER — FENTANYL CITRATE (PF) 250 MCG/5ML IJ SOLN
INTRAMUSCULAR | Status: AC
  Filled 2014-05-01: qty 5

## 2014-05-01 MED ORDER — PROPOFOL 10 MG/ML IV BOLUS
INTRAVENOUS | Status: DC | PRN
  Administered 2014-05-01: 30 mg via INTRAVENOUS

## 2014-05-01 MED ORDER — SODIUM CHLORIDE 0.9 % IV SOLN
10.0000 mL/h | Freq: Once | INTRAVENOUS | Status: AC
  Administered 2014-05-01: 22:00:00 via INTRAVENOUS

## 2014-05-01 MED ORDER — LACTATED RINGERS IV SOLN
INTRAVENOUS | Status: DC | PRN
  Administered 2014-05-01 (×2): via INTRAVENOUS

## 2014-05-01 MED ORDER — LIDOCAINE HCL (CARDIAC) 20 MG/ML IV SOLN
INTRAVENOUS | Status: AC
  Filled 2014-05-01: qty 5

## 2014-05-01 MED ORDER — ALUM & MAG HYDROXIDE-SIMETH 200-200-20 MG/5ML PO SUSP
30.0000 mL | Freq: Four times a day (QID) | ORAL | Status: DC | PRN
  Administered 2014-05-07: 30 mL via ORAL
  Filled 2014-05-01 (×2): qty 30

## 2014-05-01 MED ORDER — SODIUM CHLORIDE 0.9 % IV SOLN
INTRAVENOUS | Status: DC
  Administered 2014-05-01 (×2): via INTRAVENOUS

## 2014-05-01 MED ORDER — IOHEXOL 300 MG/ML  SOLN
50.0000 mL | Freq: Once | INTRAMUSCULAR | Status: AC | PRN
  Administered 2014-05-01: 50 mL via ORAL

## 2014-05-01 MED ORDER — PROPOFOL 10 MG/ML IV BOLUS
INTRAVENOUS | Status: AC
  Filled 2014-05-01: qty 20

## 2014-05-01 MED ORDER — MENTHOL 3 MG MT LOZG
1.0000 | LOZENGE | OROMUCOSAL | Status: DC | PRN
  Filled 2014-05-01: qty 9

## 2014-05-01 MED ORDER — ROCURONIUM BROMIDE 100 MG/10ML IV SOLN
INTRAVENOUS | Status: AC
  Filled 2014-05-01: qty 1

## 2014-05-01 SURGICAL SUPPLY — 49 items
BLADE EXTENDED COATED 6.5IN (ELECTRODE) IMPLANT
BLADE HEX COATED 2.75 (ELECTRODE) ×8 IMPLANT
CATH KIT ON-Q SILVERSOAK 7.5IN (CATHETERS) IMPLANT
COUNTER NEEDLE 20 DBL MAG RED (NEEDLE) ×4 IMPLANT
COVER MAYO STAND STRL (DRAPES) ×8 IMPLANT
DRAIN CHANNEL 19F RND (DRAIN) IMPLANT
DRAPE LAPAROSCOPIC ABDOMINAL (DRAPES) ×4 IMPLANT
DRAPE SHEET LG 3/4 BI-LAMINATE (DRAPES) ×4 IMPLANT
DRAPE UTILITY XL STRL (DRAPES) ×8 IMPLANT
DRAPE WARM FLUID 44X44 (DRAPE) ×4 IMPLANT
DRSG OPSITE POSTOP 4X10 (GAUZE/BANDAGES/DRESSINGS) IMPLANT
DRSG OPSITE POSTOP 4X12 (GAUZE/BANDAGES/DRESSINGS) ×4 IMPLANT
DRSG OPSITE POSTOP 4X6 (GAUZE/BANDAGES/DRESSINGS) IMPLANT
DRSG OPSITE POSTOP 4X8 (GAUZE/BANDAGES/DRESSINGS) IMPLANT
ELECT REM PT RETURN 9FT ADLT (ELECTROSURGICAL) ×4
ELECTRODE REM PT RTRN 9FT ADLT (ELECTROSURGICAL) ×2 IMPLANT
GAUZE SPONGE 4X4 12PLY STRL (GAUZE/BANDAGES/DRESSINGS) IMPLANT
GLOVE ECLIPSE 8.0 STRL XLNG CF (GLOVE) ×8 IMPLANT
GLOVE INDICATOR 8.0 STRL GRN (GLOVE) ×8 IMPLANT
GOWN STRL REUS W/TWL XL LVL3 (GOWN DISPOSABLE) ×16 IMPLANT
KIT BASIN OR (CUSTOM PROCEDURE TRAY) ×4 IMPLANT
LEGGING LITHOTOMY PAIR STRL (DRAPES) ×4 IMPLANT
LIGASURE IMPACT 36 18CM CVD LR (INSTRUMENTS) ×4 IMPLANT
PACK GENERAL/GYN (CUSTOM PROCEDURE TRAY) ×4 IMPLANT
PENCIL BUTTON HOLSTER BLD 10FT (ELECTRODE) ×4 IMPLANT
STAPLER VISISTAT 35W (STAPLE) ×4 IMPLANT
SUCTION POOLE TIP (SUCTIONS) ×4 IMPLANT
SUT MNCRL AB 4-0 PS2 18 (SUTURE) IMPLANT
SUT NOV 1 T60/GS (SUTURE) IMPLANT
SUT NOVA NAB DX-16 0-1 5-0 T12 (SUTURE) IMPLANT
SUT NOVA T20/GS 25 (SUTURE) IMPLANT
SUT PDS AB 1 CTX 36 (SUTURE) IMPLANT
SUT SILK 0 (SUTURE) ×2
SUT SILK 0 30XBRD TIE 6 (SUTURE) ×2 IMPLANT
SUT SILK 2 0 (SUTURE) ×2
SUT SILK 2 0 SH CR/8 (SUTURE) ×4 IMPLANT
SUT SILK 2-0 18XBRD TIE 12 (SUTURE) ×2 IMPLANT
SUT SILK 3 0 (SUTURE) ×2
SUT SILK 3 0 SH CR/8 (SUTURE) ×4 IMPLANT
SUT SILK 3-0 18XBRD TIE 12 (SUTURE) ×2 IMPLANT
SUT VIC AB 2-0 SH 18 (SUTURE) ×4 IMPLANT
SUT VIC AB 3-0 SH 18 (SUTURE) ×4 IMPLANT
SUT VICRYL 2 0 18  UND BR (SUTURE) ×2
SUT VICRYL 2 0 18 UND BR (SUTURE) ×2 IMPLANT
TOWEL OR 17X26 10 PK STRL BLUE (TOWEL DISPOSABLE) ×8 IMPLANT
TOWEL OR NON WOVEN STRL DISP B (DISPOSABLE) ×8 IMPLANT
TRAY FOLEY W/METER SILVER 14FR (SET/KITS/TRAYS/PACK) ×4 IMPLANT
TUNNELER SHEATH ON-Q 16GX12 DP (PAIN MANAGEMENT) IMPLANT
YANKAUER SUCT BULB TIP 10FT TU (MISCELLANEOUS) ×4 IMPLANT

## 2014-05-01 NOTE — Progress Notes (Signed)
This shift I  Received information shortly after getting report that pt will be having emergency surgery and will need to get to the O.R. Dr. Johney Maine relayed this information and also spoke with daughter and pt in the room. Vitals taken CHG bath performed, checklist completed ,  informed consent signed and pt transferred for surgery.

## 2014-05-01 NOTE — Anesthesia Procedure Notes (Signed)
Procedure Name: Intubation Date/Time: 04/29/2014 8:12 PM Performed by: Lissa Morales Pre-anesthesia Checklist: Patient identified, Emergency Drugs available, Suction available and Patient being monitored Patient Re-evaluated:Patient Re-evaluated prior to inductionOxygen Delivery Method: Circle System Utilized Preoxygenation: Pre-oxygenation with 100% oxygen Intubation Type: IV induction Ventilation: Mask ventilation without difficulty Laryngoscope Size: Mac and 4 Grade View: Grade II Tube type: Oral Tube size: 8.0 mm Number of attempts: 1 Airway Equipment and Method: Stylet and Oral airway Placement Confirmation: ETT inserted through vocal cords under direct vision,  positive ETCO2 and breath sounds checked- equal and bilateral Tube secured with: Tape Dental Injury: Teeth and Oropharynx as per pre-operative assessment

## 2014-05-01 NOTE — Anesthesia Postprocedure Evaluation (Signed)
  Anesthesia Post-op Note  Patient: Clifford Cook  Procedure(s) Performed: Procedure(s) (LRB): EXPLORATORY LAPAROTOMY (N/A) LAPAROSCOPIC LYSIS OF ADHESIONS PARACENTESIS  Patient Location: ICU  Anesthesia Type: General  Level of Consciousness: sedated   Airway and Oxygen Therapy: Intubated and ventilated.  Post-op Pain: mild  Post-op Assessment: Post-op Vital signs reviewed.  Last Vitals:  113, 112/60, 8, 100%  Post-op Vital Signs: stable   Complications: No apparent anesthesia complications. Transferred to ICU per plan. Intubated per plan. CXR ordered. Dr. Johney Maine consulted CCM.

## 2014-05-01 NOTE — Anesthesia Preprocedure Evaluation (Addendum)
Anesthesia Evaluation  Patient identified by MRN, date of birth, ID band Patient awake  General Assessment Comment: Stomach ulcer  . GI bleed  . Liver disorder  . Cirrhosis  . Complication of anesthesia    slow to wake up with colonoscopy - 2013  . Dysrhythmia    afib, aflutter - during hospitalization and resolved no cardiac followup  . Pneumonia    septic pneumonia - 10/2013  . Chronic kidney disease    hx of stage III CKD  . Thrombocytopenia  . Pneumonia due to streptococcus, group A 10/31/2013 . Hyponatremia 10/31/2013 . Respiratory failure with hypoxia 10/31/2013 . Rib fracture 10/31/2013 . Hip fracture 12/05/2013 . Atrial flutter 12/08/2013 . Encephalopathy, hepatic  . Abscess of hand        Reviewed: Allergy & Precautions, NPO status , Patient's Chart, lab work & pertinent test results  History of Anesthesia Complications (+) PROLONGED EMERGENCE and history of anesthetic complications  Airway Mallampati: II  TM Distance: >3 FB Neck ROM: Full    Dental no notable dental hx.    Pulmonary pneumonia -, unresolved, Current Smoker,  breath sounds clear to auscultation  Pulmonary exam normal       Cardiovascular + dysrhythmias Atrial Fibrillation Rhythm:Regular Rate:Normal  Study Conclusions  - Left ventricle: The cavity size was normal. Wall thickness was normal. Systolic function was normal. The estimated ejection fraction was in the range of 60% to 65%. Wall motion was normal; there were no regional wall motion abnormalities. - Aortic valve: No evidence of vegetation. There was no stenosis. There was trivial regurgitation. - Aorta: Normal caliber thoracic aorta with no significant plaque. - Mitral valve: No evidence of vegetation. There was trivial regurgitation. - Left atrium: The atrium was mildly dilated. No evidence of thrombus in  the atrial cavity or appendage. - Right ventricle: The cavity size was mildly dilated. Systolic function was normal.    Neuro/Psych negative neurological ROS  negative psych ROS   GI/Hepatic PUD, (+) Cirrhosis -  ascites    ,   Endo/Other  negative endocrine ROS  Renal/GU Renal InsufficiencyRenal disease  negative genitourinary   Musculoskeletal negative musculoskeletal ROS (+)   Abdominal   Peds negative pediatric ROS (+)  Hematology  (+) anemia , H/O Thrombocytopenia. Coagulopathy   Anesthesia Other Findings   Reproductive/Obstetrics negative OB ROS                            Anesthesia Physical Anesthesia Plan  ASA: IV and emergent  Anesthesia Plan: General   Post-op Pain Management:    Induction: Intravenous  Airway Management Planned: Oral ETT  Additional Equipment:   Intra-op Plan:   Post-operative Plan: Possible Post-op intubation/ventilation  Informed Consent: I have reviewed the patients History and Physical, chart, labs and discussed the procedure including the risks, benefits and alternatives for the proposed anesthesia with the patient or authorized representative who has indicated his/her understanding and acceptance.   Dental advisory given  Plan Discussed with: CRNA  Anesthesia Plan Comments: (High risk due to comorbidities including cirrhosis, risks of worsening liver function, or failure, need for multiple blood products, possible prolonged intubation, etc.)       Anesthesia Quick Evaluation

## 2014-05-01 NOTE — Progress Notes (Signed)
CXR and abd xray results reviewed CXR suggests RML/RUL infiltrate  AXR suggests possible PSBO  Place NG to LIS Consult general surgery--spoke with Dr. Johney Maine CT abd/pelvis Blood cultures Start IV vanco and imipenem for HCAP  DTat

## 2014-05-01 NOTE — Progress Notes (Signed)
ANTIBIOTIC CONSULT NOTE - INITIAL  Pharmacy Consult for Vancomycin Indication: HCAP  Allergies  Allergen Reactions  . Pulmicort [Budesonide] Shortness Of Breath    Causes stridor  . Penicillins Other (See Comments)    Rash hives, white spots.    Patient Measurements: Height: 5\' 8"  (172.7 cm) Weight: 194 lb 11.7 oz (88.33 kg) IBW/kg (Calculated) : 68.4  Vital Signs: Temp: 98.1 F (36.7 C) (04/17 0551) Temp Source: Oral (04/17 0551) BP: 109/59 mmHg (04/17 0551) Pulse Rate: 94 (04/17 0551) Intake/Output from previous day: 04/16 0701 - 04/17 0700 In: 720 [P.O.:720] Out: 800 [Urine:800] Intake/Output from this shift: Total I/O In: -  Out: 1 [Emesis/NG output:1]  Labs:  Recent Labs  04/29/14 0513 05/14/2014 0504 05/03/2014 0943  WBC 5.8  --  10.7*  HGB 8.5*  --  9.7*  PLT 95*  --  132*  CREATININE 0.74 0.72  --    Estimated Creatinine Clearance: 94.2 mL/min (by C-G formula based on Cr of 0.72). No results for input(s): VANCOTROUGH, VANCOPEAK, VANCORANDOM, GENTTROUGH, GENTPEAK, GENTRANDOM, TOBRATROUGH, TOBRAPEAK, TOBRARND, AMIKACINPEAK, AMIKACINTROU, AMIKACIN in the last 72 hours.   Microbiology: Recent Results (from the past 720 hour(s))  Body fluid culture     Status: None (Preliminary result)   Collection Time: 04/29/14  9:49 AM  Result Value Ref Range Status   Specimen Description ASCITIC  Final   Special Requests NONE  Final   Gram Stain   Final    RARE WBC PRESENT, PREDOMINANTLY MONONUCLEAR NO ORGANISMS SEEN Performed at [facility]    Culture PENDING  Incomplete   Report Status PENDING  Incomplete    Medical History: Past Medical History  Diagnosis Date  . Stomach ulcer   . GI bleed   . Liver disorder   . Cirrhosis   . Complication of anesthesia     slow to wake up with colonoscopy - 2013   . Dysrhythmia     afib, aflutter - during hospitalization and resolved no cardiac followup   . Pneumonia     septic pneumonia - 10/2013   . Chronic  kidney disease     hx of stage III CKD   . Thrombocytopenia   . Pneumonia due to streptococcus, group A 10/31/2013  . Hyponatremia 10/31/2013  . Respiratory failure with hypoxia 10/31/2013  . Rib fracture 10/31/2013  . Hip fracture 12/05/2013  . Atrial flutter 12/08/2013  . Encephalopathy, hepatic   . Abscess of hand     Assessment: 61 y/oM with PMH of NASH cirrhosis, anemia, thrombocytopenia, CKD stage III, Strep A PNA, COPD, hepatic encephalopathy who [facility] to [facility] ED on 4/13 with generalized weakness, abdominal distention, and lower back pain, found to have hyperammonemia and ascites.  This morning, patient developed ileus and vomiting, so X-rays obtained. CXR this AM suggests RML/RUL infiltrate, and AXR suggests possible pSBO. Pharmacy consulted to assist with dosing of Vancomycin for HCAP. Patient also started on Primaxin per MD.  4/17 >> Vancomycin >> 4/17 >> Primaxin >>    4/15 ascitic fluid: NGTD 4/17 blood x 2: sent  Goal of Therapy:  Vancomycin trough level 15-20 mcg/ml  Appropriate antibiotic dosing for renal function and indication Eradication of infection  Plan:   Vancomycin 2000 mg IV x 1, then 1000 mg IV q12h  Plan for Vancomycin trough level at steady state.   Monitor renal function, cultures, clinical course.   Lindell Spar, PharmD, BCPS Pager: [phone] 05/08/2014 10:37 AM

## 2014-05-01 NOTE — Progress Notes (Signed)
Referring Physician(s): Dr. 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 Pt has been admitted with acute on chronic back pain and abdominal distention with N&V. Had para on 4/15 with 3.1L removed, but no relief of sxs.  Abd x-rays suggest at least PSBO. Workup of back pain has revealed acute or subacute compression fractures at the L2, L3, and L5 levels, with the most acute likely at the L2 level by MRI The pt has uncontrolled pain requiring significant pain medications usage, likely contributing to his PSBO/ileus. IR is asked to evaluate for possible VP/KP Chart, PMHx, meds, imaging reviewed. 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 Stomach ulcer   . GI bleed   . Liver disorder   . Cirrhosis   . Complication of anesthesia     slow to wake up with colonoscopy - 2013   . Dysrhythmia     afib, aflutter - during hospitalization and resolved no cardiac followup   . Pneumonia     septic pneumonia - 10/2013   . Chronic kidney disease     hx of stage III CKD   . Thrombocytopenia   . Pneumonia due to streptococcus, group A 10/31/2013  . Hyponatremia 10/31/2013  . Respiratory failure with hypoxia 10/31/2013  . Rib fracture 10/31/2013  . Hip fracture 12/05/2013  . Atrial flutter 12/08/2013  . Encephalopathy, hepatic   . Abscess of hand    Past Surgical History  Procedure Laterality Date  . Cataract extraction, bilateral    . Surgery for lazy eye      age 50  . Esophagogastroduodenoscopy N/A 04/16/2012    Procedure: ESOPHAGOGASTRODUODENOSCOPY (EGD);  Surgeon: Jeryl Columbia, MD;  Location: [facility];  Service: Endoscopy;  Laterality: N/A;  . Esophagogastroduodenoscopy N/A 04/17/2012    Procedure: ESOPHAGOGASTRODUODENOSCOPY (EGD);  Surgeon: Jeryl Columbia, MD;  Location:  [facility];  Service: Endoscopy;  Laterality: N/A;  . Direct laryngoscopy N/A 07/13/2013    Procedure: DIRECT LARYNGOSCOPY WITH EXCISION TUMOR;  Surgeon: Rozetta Nunnery, MD;  Location: [place];  Service: ENT;  Laterality: N/A;  . Hip pinning,cannulated Left 12/06/2013    Procedure: CANNULATED HIP PINNING;  Surgeon: Renette Butters, MD;  Location: [place];  Service: Orthopedics;  Laterality: Left;  . Tee without cardioversion N/A 12/13/2013    Procedure: TRANSESOPHAGEAL ECHOCARDIOGRAM (TEE);  Surgeon: Larey Dresser, MD;  Location: [facility];  Service: Cardiovascular;  Laterality: N/A;  . Multiple extractions with alveoloplasty N/A 04/14/2014    Procedure: Extraction of tooth #'s 3,4,5,6,7,8,9,10,11,15,21,22,23,24,25,26,27,28 WITH ALVEOLOPLASTY;  Surgeon: Lenn Cal, DDS;  Location: [facility];  Service: Oral Surgery;  Laterality: N/A;   History   Social History  . Marital Status: Married    Spouse Name: N/A  . Number of Children: N/A  . Years of Education: N/A   Occupational History  . Not on file.   Social History Main Topics  . Smoking status: Current Every Day Smoker -- 1.00 packs/day    Types: Cigarettes  . Smokeless tobacco: Never Used  . Alcohol Use: No  . Drug Use: No     Comment: uses electronic cigarette   . Sexual Activity: No   Other Topics Concern  . Not on file   Social History Narrative   Patient is  married. He is independent.     Allergies: Pulmicort and Penicillins  Medications:  Current facility-administered medications:  .  0.9 %  sodium chloride infusion, , Intravenous, Continuous, Orson Eva, MD .  acetaminophen (TYLENOL) suppository 650 mg, 650 mg, Rectal, Q6H PRN, Michael Boston, MD .  alum & mag hydroxide-simeth (MAALOX/MYLANTA) 200-200-20 MG/5ML suspension 30 mL, 30 mL, Oral, Q6H PRN, Michael Boston, MD .  bisacodyl (DULCOLAX) suppository 10 mg, 10 mg, Rectal, Q12H PRN, Michael Boston, MD .  diphenhydrAMINE (BENADRYL)  capsule 25 mg, 25 mg, Oral, Q12H PRN, Albertine Patricia, MD, 25 mg at 04/29/14 2104 .  HYDROmorphone (DILAUDID) injection 1 mg, 1 mg, Intravenous, Q4H PRN, Orson Eva, MD .  imipenem-cilastatin (PRIMAXIN) 500 mg in sodium chloride 0.9 % 100 mL IVPB, 500 mg, Intravenous, 3 times per day, Orson Eva, MD .  lactulose (CHRONULAC) 10 GM/15ML solution 30 g, 30 g, Oral, TID, Albertine Patricia, MD, 30 g at 04/30/14 2302 .  lip balm (CARMEX) ointment 1 application, 1 application, Topical, BID, Michael Boston, MD .  magic mouthwash, 15 mL, Oral, QID PRN, Michael Boston, MD .  menthol-cetylpyridinium (CEPACOL) lozenge 3 mg, 1 lozenge, Oral, PRN, Michael Boston, MD .  ondansetron (ZOFRAN) tablet 4 mg, 4 mg, Oral, Q6H PRN **OR** ondansetron (ZOFRAN) injection 4 mg, 4 mg, Intravenous, Q6H PRN, Silver Huguenin Elgergawy, MD .  pantoprazole (PROTONIX) injection 40 mg, 40 mg, Intravenous, QHS, David Tat, MD .  phenol (CHLORASEPTIC) mouth spray 2 spray, 2 spray, Mouth/Throat, PRN, Michael Boston, MD .  simethicone (MYLICON) chewable tablet 160 mg, 160 mg, Oral, QID PRN, Orson Eva, MD, 160 mg at 04/29/2014 1194   Review of Systems  Constitutional: Positive for appetite change and fatigue.  HENT: Negative.   Respiratory:       Pt feels SOB due to abd distention  Cardiovascular: Negative.   Gastrointestinal: Positive for nausea, vomiting, abdominal pain and abdominal distention. Negative for blood in stool.  Genitourinary: Negative.   Musculoskeletal: Positive for back pain.       Severe low back pain at rest, worse with movements  Psychiatric/Behavioral: Negative.     Vital Signs: BP 109/59 mmHg  Pulse 94  Temp(Src) 98.1 F (36.7 C) (Oral)  Resp 18  Ht 5\' 8"  (1.727 m)  Wt 194 lb 11.7 oz (88.33 kg)  BMI 29.62 kg/m2  SpO2 91%  Physical Exam General: Pt appears uncomfortable, holding emesis basis to mouth ENT: unremarkable airway Lungs: CTA Heart: Reg Abd: distended and tight, NT Back: Pt could roll minimally due  to abd distention, unable to palpate lumbar region.  Imaging: Dg Chest 1 View  04/18/2014   CLINICAL DATA:  Cough this morning; Ileus. Dyspnea.  EXAM: CHEST  1 VIEW  COMPARISON:  04/07/2014  FINDINGS: Cardiac silhouette is borderline enlarged. No mediastinal or hilar masses. No convincing adenopathy.  There is hazy airspace opacity projecting in the right mid lung, new from prior exam. Small area of pneumonia is suspected. There is mild linear opacity at the left lung base consistent with scarring and/or atelectasis. This is stable.  Remainder of the lungs is clear. No pleural effusion or pneumothorax.  Bony thorax is demineralized but grossly intact.  IMPRESSION: Small area of right mid lung, likely right upper lobe, hazy airspace opacity consistent with pneumonia given the history of cough.   Electronically Signed   By: Lajean Manes M.D.   On: 04/17/2014 09:34   Mr Lumbar Spine Wo Contrast  04/29/2014   CLINICAL  DATA:  3-4 day history of worsening lower extremity weakness and low back pain. History of cirrhosis.  EXAM: MRI LUMBAR SPINE WITHOUT CONTRAST  TECHNIQUE: Multiplanar, multisequence MR imaging of the lumbar spine was performed. No intravenous contrast was administered.  COMPARISON:  Plain films lumbar spine 10/27/2013. CT abdomen and pelvis 04/17/2012.  FINDINGS: Remote compression fractures of L1, L3 and L4 are identified. The patient has a new mild superior endplate compression fracture of L2 with vertebral body height loss estimated at 25% with marrow edema consistent with acute or subacute injury. Mild edema is also seen in the inferior endplates of L3 and L4 consistent with acute on chronic fractures. Mild edema within the superior endplate of L5 is also identified with minimal vertebral body height loss seen. Vertebral body alignment is maintained. The conus medullaris is normal in signal and position. Imaged intra-abdominal contents are unremarkable.  The T12-L1 level is imaged in the  sagittal plane only and negative.  T12-L1:  Negative.  L1-2: Shallow disc bulge and mild facet arthropathy without central canal or foraminal stenosis.  L2-3: Small right paracentral protrusion is identified. The central canal remains open. The foramina are mildly narrowed.  L3-4: There is a shallow broad-based disc bulge and ligamentum flavum thickening. Facet degenerative disease appears worse on the left. Mild bony retropulsion is seen off the superior endplate of L4. There is moderate to moderately severe central canal stenosis at this level.  L4-5: Shallow broad-based disc bulge and ligamentum flavum thickening are seen. Epidural fat is prominent and results in crowding of descending nerve roots. Right much worse than left foraminal narrowing is identified.  L5-S1: Endplate spurring in the paravertebral space is identified. The thecal sac and foramina are open.  IMPRESSION: Acute or subacute compression fractures of L2, L3, L4 and L5. Marrow edema appears most intense in L2. Fractures in L3 and L4 are acute on chronic. The fractures cannot be definitively characterized but have an appearance most consistent with senile osteoporotic injuries.  Spondylosis as described above appears most notable at L3-4 and L4-5.   Electronically Signed   By: Inge Rise M.D.   On: 04/29/2014 12:30   US Paracentesis  04/29/2014   INDICATION: Cirrhosis, recurrent ascites. Request is made for diagnostic and therapeutic paracentesis.  EXAM: ULTRASOUND-GUIDED DIAGNOSTIC AND THERAPEUTIC PARACENTESIS  COMPARISON:  Prior paracentesis on 03/17/2014  MEDICATIONS: None.  COMPLICATIONS: None immediate  TECHNIQUE: Informed written consent was obtained from the patient after a discussion of the risks, benefits and alternatives to treatment. A timeout was performed prior to the initiation of the procedure.  Initial ultrasound scanning demonstrates a moderate amount of ascites within the left lower abdominal quadrant. The left lower  abdomen was prepped and draped in the usual sterile fashion. 1% lidocaine was used for local anesthesia. Under direct ultrasound guidance, a 19 gauge, 10-cm, Yueh catheter was introduced. An ultrasound image was saved for documentation purposed. The paracentesis was performed. The catheter was removed and a dressing was applied. The patient tolerated the procedure well without immediate post procedural complication.  FINDINGS: A total of approximately 3.1 liters of slightly turbid, blood-tinged/amber fluid was removed. Samples were sent to the laboratory as requested by the clinical team.  IMPRESSION: Successful ultrasound-guided diagnostic and therapeutic paracentesis yielding 3.1 liters of peritoneal fluid.  Read by: Rowe Robert, PA-C   Electronically Signed   By: Sandi Mariscal M.D.   On: 04/29/2014 11:18   Dg Abd 2 Views  05/08/2014   CLINICAL DATA:  Abdominal  distention, nausea and vomiting today.  EXAM: ABDOMEN - 2 VIEW  COMPARISON:  Previous imaging examinations.  FINDINGS: Multiple dilated small bowel loops containing multiple air-fluid levels. Gas and stool in mildly prominent right colon and hepatic flexure. No more distal colon gas seen other than some gas in the rectum. No free peritoneal air. Left hip fixation hardware. Lumbar and lower thoracic spine degenerative changes.  IMPRESSION: Partial small bowel obstruction. Colonic obstruction at the level of the transverse colon is less likely.   Electronically Signed   By: Claudie Revering M.D.   On: 04/27/2014 09:27    Labs:  CBC:  Recent Labs  05/05/2014 1526 04/28/14 0535 04/29/14 0513 05/07/2014 0943  WBC 7.1 5.0 5.8 10.7*  HGB 8.0* 7.1* 8.5* 9.7*  HCT 23.6* 21.2* 25.6* 29.3*  PLT 97* 87* 95* 132*    COAGS:  Recent Labs  11/11/13 2257  12/05/13 1233  03/20/14 1005 04/07/14 0930 04/29/14 0513 04/22/2014 0504  INR 1.77*  < > 1.50*  < > 1.78* 1.48 1.50* 1.51*  APTT 40*  --  43*  --   --  39*  --   --   < > = values in this interval  not displayed.  BMP:  Recent Labs  04/23/2014 1526 04/28/14 0535 04/29/14 0513 04/18/2014 0504  NA 130* 132* 131* 129*  K 4.1 3.8 3.8 4.2  CL 100 103 103 100  CO2 23 23 23 24   GLUCOSE 164* 96 99 117*  BUN 18 14 9 8   CALCIUM 7.4* 7.3* 7.4* 7.7*  CREATININE 1.07 0.83 0.74 0.72  GFRNONAA 69* 88* >90 >90  GFRAA 80* >90 >90 >90    LIVER FUNCTION TESTS:  Recent Labs  05/11/2014 1526 04/28/14 0535 04/29/14 0513 05/14/2014 0504  BILITOT 2.9* 2.6* 3.2* 3.9*  AST 37 27 33 36  ALT 23 19 22 23   ALKPHOS 160* 123* 125* 146*  PROT 5.4* 4.8* 5.0* 5.4*  ALBUMIN 2.3* 1.9* 1.9* 2.0*    Assessment and Plan: Severe back pain secondary to acute/subacute compression fractures of L2 and possibly L3 and L5. Worsening Ileus/PSBO, NGT is to be placed, Gen surgery consult, and CT abd ordered. Developing HCAP on CXR, rising WBC. Imaging reviewed and pt would likely benefit from VP/KP of Lumbar compression fractures, however, current status is worrisome for his ability to tolerate procedure. Pt has to lay prone for procedure and may not be able to tolerate even with sedation. Also concern for increased risk of infection with active PNA. Discussed VP/KP procedure with pt and family in detail, including risks, complications. Consent is filled out, but not yet signed. Will also see if pt has symptomatic improvement with NGT and await CT findings/CCS input. Will check pt status in am and review with Dr. Vernard Gambles.    I spent a total of 20 minutes face to face in clinical consultation/evaluation, greater than 50% of which was counseling/coordinating care for compression fractures.  SignedAscencion Dike 04/24/2014, 10:03 AM  2

## 2014-05-01 NOTE — Consult Note (Signed)
Urology Consult  Referring physician: D Tat Reason for referral: Hematuria  Chief Complaint: Hematuria  History of Present Illness: Male with multiple co-morbidities; alcohol/ascites, etc; recent compression fracture; hematuria; chronic anemia with Hb 8.5 2015; 8.5 now; Cr .74; recent u/sound did not comment on kidneys; voids 2-3 x per day/ once per night; no GU surgery/stones/infections; does smoke; no blood thinner Denies gross hematuria Modifying factors: There are no other modifying factors  Associated signs and symptoms: There are no other associated signs and symptoms Aggravating and relieving factors: There are no other aggravating or relieving factors Severity: Moderate Duration: Persistent   Past Medical History  Diagnosis Date  . Stomach ulcer   . GI bleed   . Liver disorder   . Cirrhosis   . Complication of anesthesia     slow to wake up with colonoscopy - 2013   . Dysrhythmia     afib, aflutter - during hospitalization and resolved no cardiac followup   . Pneumonia     septic pneumonia - 10/2013   . Chronic kidney disease     hx of stage III CKD   . Thrombocytopenia   . Pneumonia due to streptococcus, group A 10/31/2013  . Hyponatremia 10/31/2013  . Respiratory failure with hypoxia 10/31/2013  . Rib fracture 10/31/2013  . Hip fracture 12/05/2013  . Atrial flutter 12/08/2013  . Encephalopathy, hepatic   . Abscess of hand    Past Surgical History  Procedure Laterality Date  . Cataract extraction, bilateral    . Surgery for lazy eye      age 69  . Esophagogastroduodenoscopy N/A 04/16/2012    Procedure: ESOPHAGOGASTRODUODENOSCOPY (EGD);  Surgeon: Jeryl Columbia, MD;  Location: [facility];  Service: Endoscopy;  Laterality: N/A;  . Esophagogastroduodenoscopy N/A 04/17/2012    Procedure: ESOPHAGOGASTRODUODENOSCOPY (EGD);  Surgeon: Jeryl Columbia, MD;  Location: [facility];  Service: Endoscopy;  Laterality: N/A;  . Direct laryngoscopy N/A 07/13/2013    Procedure: DIRECT  LARYNGOSCOPY WITH EXCISION TUMOR;  Surgeon: Rozetta Nunnery, MD;  Location: [place];  Service: ENT;  Laterality: N/A;  . Hip pinning,cannulated Left 12/06/2013    Procedure: CANNULATED HIP PINNING;  Surgeon: Renette Butters, MD;  Location: [place];  Service: Orthopedics;  Laterality: Left;  . Tee without cardioversion N/A 12/13/2013    Procedure: TRANSESOPHAGEAL ECHOCARDIOGRAM (TEE);  Surgeon: Larey Dresser, MD;  Location: [facility];  Service: Cardiovascular;  Laterality: N/A;  . Multiple extractions with alveoloplasty N/A 04/14/2014    Procedure: Extraction of tooth #'s 3,4,5,6,7,8,9,10,11,15,21,22,23,24,25,26,27,28 WITH ALVEOLOPLASTY;  Surgeon: Lenn Cal, DDS;  Location: [facility];  Service: Oral Surgery;  Laterality: N/A;    Medications: I have reviewed the patient's current medications. Allergies:  Allergies  Allergen Reactions  . Pulmicort [Budesonide] Shortness Of Breath    Causes stridor  . Penicillins Other (See Comments)    Rash hives, white spots.    Family History  Problem Relation Age of Onset  . Heart attack Mother   . Cirrhosis Sister    Social History:  reports that he has been smoking Cigarettes.  He has been smoking about 1.00 pack per day. He has never used smokeless tobacco. He reports that he does not drink alcohol or use illicit drugs.  ROS: All systems are reviewed and negative except as noted. Rest negative  Physical Exam:  Vital signs in last 24 hours: Temp:  [98.1 F (36.7 C)-98.4 F (36.9 C)] 98.1 F (36.7 C) (04/17 0551) Pulse Rate:  [  90-95] 94 (04/17 0551) Resp:  [18] 18 (04/17 0551) BP: (105-110)/(58-61) 109/59 mmHg (04/17 0551) SpO2:  [91 %-95 %] 91 % (04/17 0551)  Cardiovascular: Skin warm; not flushed Respiratory: Breaths quiet; no shortness of breath Abdomen: No masses Neurological: Normal sensation to touch Musculoskeletal: Normal motor function arms and legs Lymphatics: No inguinal adenopathy Skin: No  rashes Genitourinary:mild edema penis; markedly distended abdomen  Laboratory Data:  Results for orders placed or performed during the hospital encounter of 04/26/2014 (from the past 72 hour(s))  Ammonia     Status: Abnormal   Collection Time: 04/29/14  5:13 AM  Result Value Ref Range   Ammonia 36 (H) 11 - 32 umol/L  Comprehensive metabolic panel     Status: Abnormal   Collection Time: 04/29/14  5:13 AM  Result Value Ref Range   Sodium 131 (L) 135 - 145 mmol/L   Potassium 3.8 3.5 - 5.1 mmol/L   Chloride 103 96 - 112 mmol/L   CO2 23 19 - 32 mmol/L   Glucose, Bld 99 70 - 99 mg/dL   BUN 9 6 - 23 mg/dL   Creatinine, Ser 0.74 0.50 - 1.35 mg/dL   Calcium 7.4 (L) 8.4 - 10.5 mg/dL   Total Protein 5.0 (L) 6.0 - 8.3 g/dL   Albumin 1.9 (L) 3.5 - 5.2 g/dL   AST 33 0 - 37 U/L   ALT 22 0 - 53 U/L   Alkaline Phosphatase 125 (H) 39 - 117 U/L   Total Bilirubin 3.2 (H) 0.3 - 1.2 mg/dL   GFR calc non Af Amer >90 >90 mL/min   GFR calc Af Amer >90 >90 mL/min    Comment: (NOTE) The eGFR has been calculated using the CKD EPI equation. This calculation has not been validated in all clinical situations. eGFR's persistently <90 mL/min signify possible Chronic Kidney Disease.    Anion gap 5 5 - 15  Hemoglobin A1c     Status: None   Collection Time: 04/29/14  5:13 AM  Result Value Ref Range   Hgb A1c MFr Bld 5.1 4.8 - 5.6 %    Comment: (NOTE)         Pre-diabetes: 5.7 - 6.4         Diabetes: >6.4         Glycemic control for adults with diabetes: <7.0    Mean Plasma Glucose 100 mg/dL    Comment: (NOTE) Performed At: [facility] [place], [place] [id number] Lindon Romp MD [id number]   TSH     Status: None   Collection Time: 04/29/14  5:13 AM  Result Value Ref Range   TSH 1.829 0.350 - 4.500 uIU/mL  CBC     Status: Abnormal   Collection Time: 04/29/14  5:13 AM  Result Value Ref Range   WBC 5.8 4.0 - 10.5 K/uL   RBC 2.71 (L) 4.22 - 5.81 MIL/uL   Hemoglobin 8.5  (L) 13.0 - 17.0 g/dL   HCT 25.6 (L) 39.0 - 52.0 %   MCV 94.5 78.0 - 100.0 fL   MCH 31.4 26.0 - 34.0 pg   MCHC 33.2 30.0 - 36.0 g/dL   RDW 19.0 (H) 11.5 - 15.5 %   Platelets 95 (L) 150 - 400 K/uL    Comment: CONSISTENT WITH PREVIOUS RESULT  Lactic acid, plasma     Status: None   Collection Time: 04/29/14  5:13 AM  Result Value Ref Range   Lactic Acid, Venous 1.5 0.5 - 2.0 mmol/L  Magnesium     Status: None   Collection Time: 04/29/14  5:13 AM  Result Value Ref Range   Magnesium 1.9 1.5 - 2.5 mg/dL  Protime-INR     Status: Abnormal   Collection Time: 04/29/14  5:13 AM  Result Value Ref Range   Prothrombin Time 18.3 (H) 11.6 - 15.2 seconds   INR 1.50 (H) 0.00 - 1.49  Body fluid cell count with differential     Status: Abnormal   Collection Time: 04/29/14  9:48 AM  Result Value Ref Range   Fluid Type-FCT ASCITIC     Comment: CORRECTED ON 04/15 AT 1019: PREVIOUSLY REPORTED AS ASCITIES   Color, Fluid YELLOW YELLOW   Appearance, Fluid HAZY (A) CLEAR   WBC, Fluid 52 0 - 1000 cu mm   Neutrophil Count, Fluid 16 0 - 25 %   Lymphs, Fluid 21 %   Monocyte-Macrophage-Serous Fluid 61 50 - 90 %   Eos, Fluid 2 %   Other Cells, Fluid OTHER CELLS IDENTIFIED AS MESOTHELIAL CELLS %  Glucose, pleural or peritoneal fluid     Status: None   Collection Time: 04/29/14  9:48 AM  Result Value Ref Range   Glucose, Fluid 138 mg/dL    Comment: (NOTE) No normal range established for this test Results should be evaluated in conjunction with serum values Performed at [place]   Pathologist smear review     Status: None   Collection Time: 04/29/14  9:48 AM  Result Value Ref Range   Path Review Reviewed By Violet Baldy, M.D.     Comment: 4.15.16 BENIGN.   Body fluid culture     Status: None (Preliminary result)   Collection Time: 04/29/14  9:49 AM  Result Value Ref Range   Specimen Description ASCITIC    Special Requests NONE    Gram Stain      RARE WBC  PRESENT, PREDOMINANTLY MONONUCLEAR NO ORGANISMS SEEN Performed at [facility]    Culture PENDING    Report Status PENDING   Urinalysis, Routine w reflex microscopic     Status: Abnormal   Collection Time: 04/30/14  6:56 PM  Result Value Ref Range   Color, Urine ORANGE (A) YELLOW    Comment: BIOCHEMICALS MAY BE AFFECTED BY COLOR   APPearance CLEAR CLEAR   Specific Gravity, Urine 1.021 1.005 - 1.030   pH 6.0 5.0 - 8.0   Glucose, UA NEGATIVE NEGATIVE mg/dL   Hgb urine dipstick LARGE (A) NEGATIVE   Bilirubin Urine NEGATIVE NEGATIVE   Ketones, ur NEGATIVE NEGATIVE mg/dL   Protein, ur NEGATIVE NEGATIVE mg/dL   Urobilinogen, UA 0.2 0.0 - 1.0 mg/dL   Nitrite NEGATIVE NEGATIVE   Leukocytes, UA SMALL (A) NEGATIVE  Urine microscopic-add on     Status: None   Collection Time: 04/30/14  6:56 PM  Result Value Ref Range   Squamous Epithelial / LPF RARE RARE   WBC, UA 3-6 <3 WBC/hpf   RBC / HPF 11-20 <3 RBC/hpf   Bacteria, UA RARE RARE   Urine-Other MUCOUS PRESENT   Comprehensive metabolic panel     Status: Abnormal   Collection Time: 05/11/2014  5:04 AM  Result Value Ref Range   Sodium 129 (L) 135 - 145 mmol/L   Potassium 4.2 3.5 - 5.1 mmol/L   Chloride 100 96 - 112 mmol/L   CO2 24 19 - 32 mmol/L   Glucose, Bld 117 (H) 70 - 99 mg/dL  BUN 8 6 - 23 mg/dL   Creatinine, Ser 0.72 0.50 - 1.35 mg/dL   Calcium 7.7 (L) 8.4 - 10.5 mg/dL   Total Protein 5.4 (L) 6.0 - 8.3 g/dL   Albumin 2.0 (L) 3.5 - 5.2 g/dL   AST 36 0 - 37 U/L   ALT 23 0 - 53 U/L   Alkaline Phosphatase 146 (H) 39 - 117 U/L   Total Bilirubin 3.9 (H) 0.3 - 1.2 mg/dL   GFR calc non Af Amer >90 >90 mL/min   GFR calc Af Amer >90 >90 mL/min    Comment: (NOTE) The eGFR has been calculated using the CKD EPI equation. This calculation has not been validated in all clinical situations. eGFR's persistently <90 mL/min signify possible Chronic Kidney Disease.    Anion gap 5 5 - 15  Protime-INR     Status: Abnormal    Collection Time: 04/26/2014  5:04 AM  Result Value Ref Range   Prothrombin Time 18.3 (H) 11.6 - 15.2 seconds   INR 1.51 (H) 0.00 - 1.49   Recent Results (from the past 240 hour(s))  Body fluid culture     Status: None (Preliminary result)   Collection Time: 04/29/14  9:49 AM  Result Value Ref Range Status   Specimen Description ASCITIC  Final   Special Requests NONE  Final   Gram Stain   Final    RARE WBC PRESENT, PREDOMINANTLY MONONUCLEAR NO ORGANISMS SEEN Performed at [facility]    Culture PENDING  Incomplete   Report Status PENDING  Incomplete   Creatinine:  Recent Labs  04/25/14 1222 05/04/2014 1526 04/28/14 0535 04/29/14 0513 04/21/2014 0504  CREATININE 1.05 1.07 0.83 0.74 0.72    Xrays: See report/chart none  Impression/Assessment:  Hematuria  Plan:  Work up described CT in hospital- will f/up if abnormal See me as outpt for cysto =- contact info given to wife   Tamella Tuccillo A 05/12/2014, 9:41 AM

## 2014-05-01 NOTE — Progress Notes (Signed)
UR completed 

## 2014-05-01 NOTE — Consult Note (Signed)
[facility]  [address] [place], [place] [address] Phone: [phone] FAX: [place]  [birth year] [id number]  CARE TEAM:  PCP: [name], MD  Outpatient Care Team: Patient Care Team: Kathyrn Lass, MD as PCP - General (Family Medicine) Bjorn Loser, MD as Consulting Physician (Urology) Laurence Spates, MD as Consulting Physician (Gastroenterology) Clarene Essex, MD as Consulting Physician (Gastroenterology) Renette Butters, MD as Consulting Physician (Orthopedic Surgery) Rozetta Nunnery, MD as Consulting Physician (Otolaryngology) Lenn Cal, DDS as Consulting Physician (Dentistry) Larey Dresser, MD as Consulting Physician (Cardiology)  Inpatient Treatment Team: Treatment Team: Attending Provider: Orson Eva, MD; Rounding Team: Ian Bushman, MD; Technician: Wilder Glade, NT; Registered Nurse: Dala Dock, RN; Registered Nurse: Lolita Rieger, RN; Consulting Physician: Laurence Spates, MD; Registered Nurse: Milta Deiters, RN; Consulting Physician: Bjorn Loser, MD; Consulting Physician: Nolon Nations, MD  This patient is a 70 y.o.male who presents today for surgical evaluation at the request of Dr Tat.   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  Has had decompensation with 3 admissions.  Follow closely by [facility] gastroenterology.  They claim he had a colonoscopy within the past year or so that was normal.   I cannot find those records.  Some history of portal gastropathy and may be some mild varices but no major bleeding.  He has never had abdominal surgeries.  He has had decompensations requiring paracentesis and aggressive diuresis.  Looks like he needs to be tapped several times a year the past few years.  Worsening elevated ammonia encephalopathy.  Recently had increase his lactulose to help  compensate his cirrhosis.  Readmitted.  Lactulose increase.  Patient had worsening distention with nausea and vomiting.  X-ray suspicious for bowel obstruction.  Surgical consultation recommended.  Discussed with Dr. Carles Collet with admitting internal medicine.  Recommended CAT scan and nasogastric tube decompression.  Nasogastric tube placed and a liter of very thick brown fluid returned.  Pain less but still very distended.  Wife and daughter at bedside.  Nurse at bedside. Patient with some encephalopathy and intermittently sleepy but can answer questions.  Denies any abdominal pain.  Has had some flatus but no bowel movements.   Again no surgeries.  No history of hernias.  Has some moderate abdominal pain secondary to chronic distention.  Past Medical History  Diagnosis Date  . Stomach ulcer   . GI bleed   . Liver disorder   . Cirrhosis   . Complication of anesthesia     slow to wake up with colonoscopy - 2013   . Dysrhythmia     afib, aflutter - during hospitalization and resolved no cardiac followup   . Pneumonia     septic pneumonia - 10/2013   . Chronic kidney disease     hx of stage III CKD   . Thrombocytopenia   . Pneumonia due to streptococcus, group A 10/31/2013  . Hyponatremia 10/31/2013  . Respiratory failure with hypoxia 10/31/2013  . Rib fracture 10/31/2013  . Hip fracture 12/05/2013  . Atrial flutter 12/08/2013  . Encephalopathy, hepatic   . Abscess of hand     Past Surgical History  Procedure Laterality Date  . Cataract extraction, bilateral    . Surgery for lazy eye      age 69  . Esophagogastroduodenoscopy N/A 04/16/2012    Procedure:  ESOPHAGOGASTRODUODENOSCOPY (EGD);  Surgeon: Jeryl Columbia, MD;  Location: [facility];  Service: Endoscopy;  Laterality: N/A;  . Esophagogastroduodenoscopy N/A 04/17/2012    Procedure: ESOPHAGOGASTRODUODENOSCOPY (EGD);  Surgeon: Jeryl Columbia, MD;  Location: [facility];  Service: Endoscopy;  Laterality: N/A;  . Direct laryngoscopy N/A  07/13/2013    Procedure: DIRECT LARYNGOSCOPY WITH EXCISION TUMOR;  Surgeon: Rozetta Nunnery, MD;  Location: [facility];  Service: ENT;  Laterality: N/A;  . Hip pinning,cannulated Left 12/06/2013    Procedure: CANNULATED HIP PINNING;  Surgeon: Renette Butters, MD;  Location: [facility];  Service: Orthopedics;  Laterality: Left;  . Tee without cardioversion N/A 12/13/2013    Procedure: TRANSESOPHAGEAL ECHOCARDIOGRAM (TEE);  Surgeon: Larey Dresser, MD;  Location: [facility] END[facility]COPY;  Service: Cardiovascular;  Laterality: N/A;  . Multiple extractions with alveoloplasty N/A 04/14/2014    Procedure: Extraction of tooth #'s 3,4,5,6,7,8,9,10,11,15,21,22,23,24,25,26,27,28 WITH ALVEOLOPLASTY;  Surgeon: Lenn Cal, DDS;  Location: [facility];  Service: Oral Surgery;  Laterality: N/A;    History   Social History  . Marital Status: Married    Spouse Name: N/A  . Number of Children: N/A  . Years of Education: N/A   Occupational History  . Not on file.   Social History Main Topics  . Smoking status: Current Every Day Smoker -- 1.00 packs/day    Types: Cigarettes  . Smokeless tobacco: Never Used  . Alcohol Use: No  . Drug Use: No     Comment: uses electronic cigarette   . Sexual Activity: No   Other Topics Concern  . Not on file   Social History Narrative   Patient is married. He is independent.    Family History  Problem Relation Age of Onset  . Heart attack Mother   . Cirrhosis Sister     Current Facility-Administered Medications  Medication Dose Route Frequency Provider Last Rate Last Dose  . 0.9 %  sodium chloride infusion   Intravenous Continuous Orson Eva, MD      . acetaminophen (TYLENOL) suppository 650 mg  650 mg Rectal Q6H PRN Michael Boston, MD      . alum & mag hydroxide-simeth (MAALOX/MYLANTA) 200-200-20 MG/5ML suspension 30 mL  30 mL Oral Q6H PRN Michael Boston, MD      . bisacodyl (DULCOLAX) suppository 10 mg  10 mg Rectal Q12H PRN Michael Boston, MD      .  diphenhydrAMINE (BENADRYL) capsule 25 mg  25 mg Oral Q12H PRN Albertine Patricia, MD   25 mg at 04/29/14 2104  . HYDROmorphone (DILAUDID) injection 1 mg  1 mg Intravenous Q4H PRN Orson Eva, MD      . imipenem-cilastatin (PRIMAXIN) 500 mg in sodium chloride 0.9 % 100 mL IVPB  500 mg Intravenous 3 times per day Orson Eva, MD      . lactulose (CHRONULAC) 10 GM/15ML solution 30 g  30 g Oral TID Albertine Patricia, MD   30 g at 04/30/14 2302  . lip balm (CARMEX) ointment 1 application  1 application Topical BID Michael Boston, MD      . magic mouthwash  15 mL Oral QID PRN Michael Boston, MD      . menthol-cetylpyridinium (CEPACOL) lozenge 3 mg  1 lozenge Oral PRN Michael Boston, MD      . ondansetron [facility]) tablet 4 mg  4 mg Oral Q6H PRN Albertine Patricia, MD       Or  . ondansetron (ZOFRAN) injection 4 mg  4 mg  Intravenous Q6H PRN Albertine Patricia, MD      . pantoprazole (PROTONIX) injection 40 mg  40 mg Intravenous QHS David Tat, MD      . phenol (CHLORASEPTIC) mouth spray 2 spray  2 spray Mouth/Throat PRN Michael Boston, MD      . simethicone [facility]) chewable tablet 160 mg  160 mg Oral QID PRN Orson Eva, MD   160 mg at 04/25/2014 4656     Allergies  Allergen Reactions  . Pulmicort [Budesonide] Shortness Of Breath    Causes stridor  . Penicillins Other (See Comments)    Rash hives, white spots.    ROS: Constitutional:  No fevers, chills, sweats.  Weight stable Eyes:  No vision changes, No discharge HENT:  No sore throats, nasal drainage Lymph: No neck swelling, No bruising easily Pulmonary:  No cough, productive sputum CV: No orthopnea, PND  Patient walks 10 minutes.  No exertional chest/neck/shoulder/arm pain. GI:  No personal nor family history of GI/colon cancer, inflammatory bowel disease, irritable bowel syndrome, allergy such as Celiac Sprue, dietary/dairy problems, colitis, ulcers nor gastritis.  No recent sick contacts/gastroenteritis.  No travel outside the country.  No changes in  diet. Renal: No UTIs, No hematuria Genital:  No drainage, bleeding, masses Musculoskeletal: No severe joint pain.  Good ROM major joints Skin:  No sores or lesions.  No rashes Heme/Lymph:  No easy bleeding.  No swollen lymph nodes Neuro: No focal weakness/numbness.  No seizures Psych: No suicidal ideation.  No hallucinations  BP 109/59 mmHg  Pulse 94  Temp(Src) 98.1 F (36.7 C) (Oral)  Resp 18  Ht 5' 8"  (1.727 m)  Wt 88.33 kg (194 lb 11.7 oz)  BMI 29.62 kg/m2  SpO2 91%  Physical Exam: General: Pt awakens/somwhat alert/oriented x4 in mild major acute distress.  Falls back asleep.  Easily arousable. Eyes: PERRL, normal EOM. Sclera nonicteric Neuro: CN II-XII intact w/o focal sensory/motor deficits. Lymph: No head/neck/groin lymphadenopathy Psych:  No psychosis/paranoia HENT: Normocephalic, Mucus membranes moist.  No thrush Neck: Supple, No tracheal deviation Chest: No pain.  Good respiratory excursion. CV:  Pulses intact.  Regular rhythm Abdomen: Somewhat firm.  Very distended.  Obvious fluid wave.  Mild diastases recti.  No umbilical hernia. No abdominal incisions.   No Cap[place].  No obvious telangiectasias. Genital:Normal external male genitalia with some decreased testicular size.  No obvious inguinal hernias.  No pain or discomfort. Ext:  SCDs BLE.  + edema.  No cyanosis Skin: No petechiae / purpurea.  No major sores Musculoskeletal: No severe joint pain.  Good ROM major joints   Results:   Labs: Results for orders placed or performed during the hospital encounter of 05/14/2014 (from the past 48 hour(s))  Urinalysis, Routine w reflex microscopic     Status: Abnormal   Collection Time: 04/30/14  6:56 PM  Result Value Ref Range   Color, Urine ORANGE (A) YELLOW    Comment: BIOCHEMICALS MAY BE AFFECTED BY COLOR   APPearance CLEAR CLEAR   Specific Gravity, Urine 1.021 1.005 - 1.030   pH 6.0 5.0 - 8.0   Glucose, UA NEGATIVE NEGATIVE mg/dL   Hgb urine dipstick LARGE (A)  NEGATIVE   Bilirubin Urine NEGATIVE NEGATIVE   Ketones, ur NEGATIVE NEGATIVE mg/dL   Protein, ur NEGATIVE NEGATIVE mg/dL   Urobilinogen, UA 0.2 0.0 - 1.0 mg/dL   Nitrite NEGATIVE NEGATIVE   Leukocytes, UA SMALL (A) NEGATIVE  Urine microscopic-add on     Status: None   Collection  Time: 04/30/14  6:56 PM  Result Value Ref Range   Squamous Epithelial / LPF RARE RARE   WBC, UA 3-6 <3 WBC/hpf   RBC / HPF 11-20 <3 RBC/hpf   Bacteria, UA RARE RARE   Urine-Other MUCOUS PRESENT   Comprehensive metabolic panel     Status: Abnormal   Collection Time: 05/03/2014  5:04 AM  Result Value Ref Range   Sodium 129 (L) 135 - 145 mmol/L   Potassium 4.2 3.5 - 5.1 mmol/L   Chloride 100 96 - 112 mmol/L   CO2 24 19 - 32 mmol/L   Glucose, Bld 117 (H) 70 - 99 mg/dL   BUN 8 6 - 23 mg/dL   Creatinine, Ser 0.72 0.50 - 1.35 mg/dL   Calcium 7.7 (L) 8.4 - 10.5 mg/dL   Total Protein 5.4 (L) 6.0 - 8.3 g/dL   Albumin 2.0 (L) 3.5 - 5.2 g/dL   AST 36 0 - 37 U/L   ALT 23 0 - 53 U/L   Alkaline Phosphatase 146 (H) 39 - 117 U/L   Total Bilirubin 3.9 (H) 0.3 - 1.2 mg/dL   GFR calc non Af Amer >90 >90 mL/min   GFR calc Af Amer >90 >90 mL/min    Comment: (NOTE) The eGFR has been calculated using the CKD EPI equation. This calculation has not been validated in all clinical situations. eGFR's persistently <90 mL/min signify possible Chronic Kidney Disease.    Anion gap 5 5 - 15  Protime-INR     Status: Abnormal   Collection Time: 04/26/2014  5:04 AM  Result Value Ref Range   Prothrombin Time 18.3 (H) 11.6 - 15.2 seconds   INR 1.51 (H) 0.00 - 1.49  CBC     Status: Abnormal   Collection Time: 05/02/2014  9:43 AM  Result Value Ref Range   WBC 10.7 (H) 4.0 - 10.5 K/uL   RBC 3.08 (L) 4.22 - 5.81 MIL/uL   Hemoglobin 9.7 (L) 13.0 - 17.0 g/dL   HCT 29.3 (L) 39.0 - 52.0 %   MCV 95.1 78.0 - 100.0 fL   MCH 31.5 26.0 - 34.0 pg   MCHC 33.1 30.0 - 36.0 g/dL   RDW 18.2 (H) 11.5 - 15.5 %   Platelets 132 (L) 150 - 400 K/uL     Imaging / Studies: Dg Chest 1 View  04/26/2014   CLINICAL DATA:  Cough this morning; Ileus. Dyspnea.  EXAM: CHEST  1 VIEW  COMPARISON:  04/07/2014  FINDINGS: Cardiac silhouette is borderline enlarged. No mediastinal or hilar masses. No convincing adenopathy.  There is hazy airspace opacity projecting in the right mid lung, new from prior exam. Small area of pneumonia is suspected. There is mild linear opacity at the left lung base consistent with scarring and/or atelectasis. This is stable.  Remainder of the lungs is clear. No pleural effusion or pneumothorax.  Bony thorax is demineralized but grossly intact.  IMPRESSION: Small area of right mid lung, likely right upper lobe, hazy airspace opacity consistent with pneumonia given the history of cough.   Electronically Signed   By: Lajean Manes M.D.   On: 05/14/2014 09:34   Dg Chest 2 View  04/07/2014   CLINICAL DATA:  Preoperative exam prior to dental surgery ; history of cirrhosis, NASH, and sepsis  EXAM: CHEST  2 VIEW  COMPARISON:  Portable chest x-ray of December 12, 2013  FINDINGS: The lungs are mildly hyperinflated. The interstitial markings are coarse. There is no alveolar infiltrate. There is  a trace of blunting of the costophrenic angles. The heart and pulmonary vascularity are normal. The mediastinum is normal in width. The bony thorax is unremarkable.  IMPRESSION: COPD and mild pulmonary fibrotic changes. There is no CHF nor pneumonia.   Electronically Signed   By: David  Martinique   On: 04/07/2014 09:07   Mr Lumbar Spine Wo Contrast  04/29/2014   CLINICAL DATA:  3-4 day history of worsening lower extremity weakness and low back pain. History of cirrhosis.  EXAM: MRI LUMBAR SPINE WITHOUT CONTRAST  TECHNIQUE: Multiplanar, multisequence MR imaging of the lumbar spine was performed. No intravenous contrast was administered.  COMPARISON:  Plain films lumbar spine 10/27/2013. CT abdomen and pelvis 04/17/2012.  FINDINGS: Remote compression fractures  of L1, L3 and L4 are identified. The patient has a new mild superior endplate compression fracture of L2 with vertebral body height loss estimated at 25% with marrow edema consistent with acute or subacute injury. Mild edema is also seen in the inferior endplates of L3 and L4 consistent with acute on chronic fractures. Mild edema within the superior endplate of L5 is also identified with minimal vertebral body height loss seen. Vertebral body alignment is maintained. The conus medullaris is normal in signal and position. Imaged intra-abdominal contents are unremarkable.  The T12-L1 level is imaged in the sagittal plane only and negative.  T12-L1:  Negative.  L1-2: Shallow disc bulge and mild facet arthropathy without central canal or foraminal stenosis.  L2-3: Small right paracentral protrusion is identified. The central canal remains open. The foramina are mildly narrowed.  L3-4: There is a shallow broad-based disc bulge and ligamentum flavum thickening. Facet degenerative disease appears worse on the left. Mild bony retropulsion is seen off the superior endplate of L4. There is moderate to moderately severe central canal stenosis at this level.  L4-5: Shallow broad-based disc bulge and ligamentum flavum thickening are seen. Epidural fat is prominent and results in crowding of descending nerve roots. Right much worse than left foraminal narrowing is identified.  L5-S1: Endplate spurring in the paravertebral space is identified. The thecal sac and foramina are open.  IMPRESSION: Acute or subacute compression fractures of L2, L3, L4 and L5. Marrow edema appears most intense in L2. Fractures in L3 and L4 are acute on chronic. The fractures cannot be definitively characterized but have an appearance most consistent with senile osteoporotic injuries.  Spondylosis as described above appears most notable at L3-4 and L4-5.   Electronically Signed   By: Inge Rise M.D.   On: 04/29/2014 12:30   US  Paracentesis  04/29/2014   INDICATION: Cirrhosis, recurrent ascites. Request is made for diagnostic and therapeutic paracentesis.  EXAM: ULTRASOUND-GUIDED DIAGNOSTIC AND THERAPEUTIC PARACENTESIS  COMPARISON:  Prior paracentesis on 03/17/2014  MEDICATIONS: None.  COMPLICATIONS: None immediate  TECHNIQUE: Informed written consent was obtained from the patient after a discussion of the risks, benefits and alternatives to treatment. A timeout was performed prior to the initiation of the procedure.  Initial ultrasound scanning demonstrates a moderate amount of ascites within the left lower abdominal quadrant. The left lower abdomen was prepped and draped in the usual sterile fashion. 1% lidocaine was used for local anesthesia. Under direct ultrasound guidance, a 19 gauge, 10-cm, Yueh catheter was introduced. An ultrasound image was saved for documentation purposed. The paracentesis was performed. The catheter was removed and a dressing was applied. The patient tolerated the procedure well without immediate post procedural complication.  FINDINGS: A total of approximately 3.1 liters of slightly  turbid, blood-tinged/amber fluid was removed. Samples were sent to the laboratory as requested by the clinical team.  IMPRESSION: Successful ultrasound-guided diagnostic and therapeutic paracentesis yielding 3.1 liters of peritoneal fluid.  Read by: Rowe Robert, PA-C   Electronically Signed   By: Sandi Mariscal M.D.   On: 04/29/2014 11:18   Dg Abd 2 Views  05/14/2014   CLINICAL DATA:  Abdominal distention, nausea and vomiting today.  EXAM: ABDOMEN - 2 VIEW  COMPARISON:  Previous imaging examinations.  FINDINGS: Multiple dilated small bowel loops containing multiple air-fluid levels. Gas and stool in mildly prominent right colon and hepatic flexure. No more distal colon gas seen other than some gas in the rectum. No free peritoneal air. Left hip fixation hardware. Lumbar and lower thoracic spine degenerative changes.  IMPRESSION:  Partial small bowel obstruction. Colonic obstruction at the level of the transverse colon is less likely.   Electronically Signed   By: Claudie Revering M.D.   On: 05/02/2014 09:27    Medications / Allergies: per chart  Antibiotics: Anti-infectives    Start     Dose/Rate Route Frequency Ordered Stop   05/13/2014 1400  imipenem-cilastatin (PRIMAXIN) 500 mg in sodium chloride 0.9 % 100 mL IVPB     500 mg 200 mL/hr over 30 Minutes Intravenous 3 times per day 04/30/2014 0958     04/26/2014 2200  rifaximin (XIFAXAN) tablet 550 mg  Status:  Discontinued     550 mg Oral 2 times daily 05/14/2014 1937 05/05/2014 6761      Assessment  Jenetta Downer  69 y.o. male       Problem List:  Principal Problem:   Encephalopathy, hepatic Active Problems:   Liver cirrhosis secondary to NASH   Ascites   Anemia   Thrombocytopenia   Hyperammonemia   Compression fracture   Ileus   Nausea vomiting distention in a patient without prior surgeries nor hernias and normal endoscopies.  Suspect ileus related to decompensated cirrhosis.  Plan:  Nasogastric tube decompression.  Hydration +/- diuresis per medicine.  CT scan of abdomen and pelvis.  Rule out bowel obstruction versus ileus.   Suspect he would benefit from paracentesis again at some point with albumin replacement.  Somewhat dicey.  Defer to gastroenterology medicine.  This patient's at least child see with a bad meld score.  Very high operative risks.  He is not toxic with peritonitis it would hesitate to operate in this patient since he has high morbidity mortality risks.  He is feeling better with the nasogastric tube in place.  Discussed with patient, wife, daughter, nurse.  I will agree with recommendations.  We will continue to follow.  Hopefully can resolve nonoperatively.  Confirm true diagnosis of bowel obstruction for since seems like ileus.  The patient is stable.  There is no evidence of peritonitis, acute abdomen, nor shock.  There is no  strong evidence of failure of improvement nor decline with current non-operative management.  There is no need for surgery at the present moment.  We will continue to follow.  -VTE prophylaxis- SCDs, etc  -mobilize as tolerated to help recovery    Adin Hector, M.D., F.A.C.S. Gastrointestinal and Minimally Invasive Surgery [facility] [address], [place], [place] [address] [phone] Main / Paging   05/03/2014  Note: Portions of this report may have been transcribed using voice recognition software. Every effort was made to ensure accuracy; however, inadvertent computerized transcription errors may be present.  Any transcriptional errors that result from this process are unintentional.

## 2014-05-01 NOTE — Progress Notes (Signed)
PROGRESS NOTE  Clifford Cook CVE:[id number] DOB: 03-16-1945 DOA: 05/05/2014 PCP: [name], MD  Brief History 69 year old male with a history of NASH cirrhosis, CKD stage III, COPD, chronic back pain presented with worsening abdominal distention, low back pain and generalized weakness since his discharge from the hospital on 04/15/2014. The patient was found to have ascites and underwent paracentesis on 04/29/2014 removing 3.1 L. At the time of admission, the patient's ammonia103. The patient did not take his lactulose for 2 days prior to admission because of his back pain and not wanting to get up for bowel movements. The patient was started back on lactulose with improvement of his ammonia level to 36.  MRI of the lumbar spine showed acute on chronic L3 and L4 compression fractures. The patient was started on intravenous morphine. The patient and family refused any further physical therapy at this time secondary to pain. Unfortunately, the patient developed an ileus with vomiting on the morning of 04/24/2014. Abdominal x-rays have been obtained. Assessment/Plan: Ileus -in part due to opioids -pt had BM 04/30/14 BM, but now not passing flatus -pcxr and 2 view abd xray -will likely need NG decompression pending results of radiographs -judicious IVF   Uncontrolled low back pain with leg weakness/Lumbar compression fractures -MR Lumbar spine--acute vs subacute compression fractures L2,L3,L5 and acute on chronic L3-4 -spoke with Dr. Pascal Lux in IR--will try to schedule kyphoplasty on Monday 4/18 -pain control--> remains uncontrolled -d/c morphine, start lactulose -straight leg test neg -pt and family refuse PT -check 25 vitamin D--continue weekly drisdol  Ascites in pt with NASH Cirrhosis -04/29/2014--paracentesis--WBC 52, not consistent with SBP--3.1 L removed  -send ascites for culture--follow -hold abx as pt is afebrile and hemodynamically stable -given albumin IV after  paracentesis -family request nutrition eval -Hold diuretics in setting of abd distension/ileus for now  Hyperammonemia -may need lactulose enemas if remains npo -recheck ammonia-->36 -hold rifaximin due to npo/ileus  Hematuria -painless -mulitple UA have showed RBCs and Hgb -consulted urology--spoke with Dr. McDiarmid  Hyponatremia -secondary to cirrhosis with component of volume depletion  Generalized weakness -multifactorial including deconditioning, pain and mutitple co-morbidities -PT eval--family refuses  Anemia of chronic disease -4/14--transfused one unit PRBC  Thrombocytopenia -Secondary to cirrhosis -Monitor for signs of bleeding  Hyperglycemia -check A1C--5.1  Family Communication:Daughter updated on phone on 4/17 @0830   Disposition Plan: Home when medically stable; refuses SNF        Procedures/Studies: Dg Chest 2 View  04/07/2014   CLINICAL DATA:  Preoperative exam prior to dental surgery ; history of cirrhosis, NASH, and sepsis  EXAM: CHEST  2 VIEW  COMPARISON:  Portable chest x-ray of December 12, 2013  FINDINGS: The lungs are mildly hyperinflated. The interstitial markings are coarse. There is no alveolar infiltrate. There is a trace of blunting of the costophrenic angles. The heart and pulmonary vascularity are normal. The mediastinum is normal in width. The bony thorax is unremarkable.  IMPRESSION: COPD and mild pulmonary fibrotic changes. There is no CHF nor pneumonia.   Electronically Signed   By: Hiawatha Merriott  [place]   On: 04/07/2014 09:07   Mr Lumbar Spine Wo Contrast  04/29/2014   CLINICAL DATA:  3-4 day history of worsening lower extremity weakness and low back pain. History of cirrhosis.  EXAM: MRI LUMBAR SPINE WITHOUT CONTRAST  TECHNIQUE: Multiplanar, multisequence MR imaging of the lumbar spine was performed. No intravenous contrast was administered.  COMPARISON:  Plain films lumbar spine  10/27/2013. CT abdomen and pelvis 04/17/2012.  FINDINGS:  Remote compression fractures of L1, L3 and L4 are identified. The patient has a new mild superior endplate compression fracture of L2 with vertebral body height loss estimated at 25% with marrow edema consistent with acute or subacute injury. Mild edema is also seen in the inferior endplates of L3 and L4 consistent with acute on chronic fractures. Mild edema within the superior endplate of L5 is also identified with minimal vertebral body height loss seen. Vertebral body alignment is maintained. The conus medullaris is normal in signal and position. Imaged intra-abdominal contents are unremarkable.  The T12-L1 level is imaged in the sagittal plane only and negative.  T12-L1:  Negative.  L1-2: Shallow disc bulge and mild facet arthropathy without central canal or foraminal stenosis.  L2-3: Small right paracentral protrusion is identified. The central canal remains open. The foramina are mildly narrowed.  L3-4: There is a shallow broad-based disc bulge and ligamentum flavum thickening. Facet degenerative disease appears worse on the left. Mild bony retropulsion is seen off the superior endplate of L4. There is moderate to moderately severe central canal stenosis at this level.  L4-5: Shallow broad-based disc bulge and ligamentum flavum thickening are seen. Epidural fat is prominent and results in crowding of descending nerve roots. Right much worse than left foraminal narrowing is identified.  L5-S1: Endplate spurring in the paravertebral space is identified. The thecal sac and foramina are open.  IMPRESSION: Acute or subacute compression fractures of L2, L3, L4 and L5. Marrow edema appears most intense in L2. Fractures in L3 and L4 are acute on chronic. The fractures cannot be definitively characterized but have an appearance most consistent with senile osteoporotic injuries.  Spondylosis as described above appears most notable at L3-4 and L4-5.   Electronically Signed   By: Inge Rise M.D.   On: 04/29/2014  12:30   US Paracentesis  04/29/2014   INDICATION: Cirrhosis, recurrent ascites. Request is made for diagnostic and therapeutic paracentesis.  EXAM: ULTRASOUND-GUIDED DIAGNOSTIC AND THERAPEUTIC PARACENTESIS  COMPARISON:  Prior paracentesis on 03/17/2014  MEDICATIONS: None.  COMPLICATIONS: None immediate  TECHNIQUE: Informed written consent was obtained from the patient after a discussion of the risks, benefits and alternatives to treatment. A timeout was performed prior to the initiation of the procedure.  Initial ultrasound scanning demonstrates a moderate amount of ascites within the left lower abdominal quadrant. The left lower abdomen was prepped and draped in the usual sterile fashion. 1% lidocaine was used for local anesthesia. Under direct ultrasound guidance, a 19 gauge, 10-cm, Yueh catheter was introduced. An ultrasound image was saved for documentation purposed. The paracentesis was performed. The catheter was removed and a dressing was applied. The patient tolerated the procedure well without immediate post procedural complication.  FINDINGS: A total of approximately 3.1 liters of slightly turbid, blood-tinged/amber fluid was removed. Samples were sent to the laboratory as requested by the clinical team.  IMPRESSION: Successful ultrasound-guided diagnostic and therapeutic paracentesis yielding 3.1 liters of peritoneal fluid.  Read by: Rowe Robert, PA-C   Electronically Signed   By: Sandi Mariscal M.D.   On: 04/29/2014 11:18         Subjective: Patient complains of increasing abdominal pain and distention. He had an episode of emesis this morning. Denies any fevers, chills, chest pain shortness breath, headache. He had a bowel movement in the afternoon of 04/30/2014, but has not passed any flatus since then.  Objective: Filed Vitals:   04/30/14 0549 04/30/14 1409  04/30/14 2139 04/27/2014 0551  BP: 113/55 110/61 105/58 109/59  Pulse: 92 90 95 94  Temp: 98.6 F (37 C) 98.4 F (36.9 C) 98.4 F  (36.9 C) 98.1 F (36.7 C)  TempSrc: Oral Oral Oral Oral  Resp: 20 18 18 18   Height:      Weight:      SpO2: 96% 95% 94% 91%    Intake/Output Summary (Last 24 hours) at 05/14/2014 0853 Last data filed at 05/08/2014 0220  Gross per 24 hour  Intake    480 ml  Output    500 ml  Net    -20 ml   Weight change:  Exam:   General:  Pt is alert, follows commands appropriately, not in acute distress  HEENT: No icterus, No thrush, No neck mass, [place]/AT  Cardiovascular: RRR, S1/S2, no rubs, no gallops  Respiratory: Bibasilar crackles, right greater than left. No wheeze  Abdomen: Soft/diminished BS, distended with tenderness to palpation; no peritoneal signs  Extremities: 1+LE edema, No lymphangitis, No petechiae, No rashes, no synovitis  Data Reviewed: Basic Metabolic Panel:  Recent Labs Lab 04/25/14 1222 04/19/2014 1526 04/28/14 0535 04/29/14 0513 04/27/2014 0504  NA 130* 130* 132* 131* 129*  K 3.8 4.1 3.8 3.8 4.2  CL 96 100 103 103 100  CO2 24 23 23 23 24   GLUCOSE 144* 164* 96 99 117*  BUN 15 18 14 9 8   CREATININE 1.05 1.07 0.83 0.74 0.72  CALCIUM 7.6* 7.4* 7.3* 7.4* 7.7*  MG  --   --   --  1.9  --    Liver Function Tests:  Recent Labs Lab 04/25/14 1222 05/03/2014 1526 04/28/14 0535 04/29/14 0513 04/20/2014 0504  AST 36 37 27 33 36  ALT 24 23 19 22 23   ALKPHOS 148* 160* 123* 125* 146*  BILITOT 3.1* 2.9* 2.6* 3.2* 3.9*  PROT 5.4* 5.4* 4.8* 5.0* 5.4*  ALBUMIN 2.3* 2.3* 1.9* 1.9* 2.0*    Recent Labs Lab 04/25/14 1222  LIPASE 101*    Recent Labs Lab 04/25/14 1305 05/09/2014 1651 04/28/14 0535 04/29/14 0513  AMMONIA 36* 103* 33* 36*   CBC:  Recent Labs Lab 04/25/14 1222 04/19/2014 1526 04/28/14 0535 04/29/14 0513  WBC 7.6 7.1 5.0 5.8  NEUTROABS 5.9 5.2  --   --   HGB 7.7* 8.0* 7.1* 8.5*  HCT 22.6* 23.6* 21.2* 25.6*  MCV 94.6 94.0 94.6 94.5  PLT PLATELET CLUMPS NOTED ON SMEAR, UNABLE TO ESTIMATE 97* 87* 95*   Cardiac Enzymes: No results for input(s):  CKTOTAL, CKMB, CKMBINDEX, TROPONINI in the last 168 hours. BNP: Invalid input(s): POCBNP CBG: No results for input(s): GLUCAP in the last 168 hours.  Recent Results (from the past 240 hour(s))  Body fluid culture     Status: None (Preliminary result)   Collection Time: 04/29/14  9:49 AM  Result Value Ref Range Status   Specimen Description ASCITIC  Final   Special Requests NONE  Final   Gram Stain   Final    RARE WBC PRESENT, PREDOMINANTLY MONONUCLEAR NO ORGANISMS SEEN Performed at [facility]    Culture PENDING  Incomplete   Report Status PENDING  Incomplete     Scheduled Meds: . sodium chloride   Intravenous Once  . lactulose  30 g Oral TID  . pantoprazole (PROTONIX) IV  40 mg Intravenous QHS   Continuous Infusions:    Tri Chittick, DO  [facility] Pager [phone]  If 7PM-7AM, please contact night-coverage [email or web address] Password [facility] 04/23/2014, 8:53  AM   LOS: 4 days

## 2014-05-01 NOTE — Discharge Instructions (Signed)
Cirrhosis Cirrhosis is a condition of scarring of the liver which is caused when the liver has tried repairing itself following damage. This damage may come from a previous infection such as one of the forms of hepatitis (usually hepatitis C), or the damage may come from being injured by toxins. The main toxin that causes this damage is alcohol. The scarring of the liver from use of alcohol is irreversible. That means the liver cannot return to normal even though alcohol is not used any more. The main danger of hepatitis C infection is that it may cause long-lasting (chronic) liver disease, and this also may lead to cirrhosis. This complication is progressive and irreversible. CAUSES  Prior to available blood tests, hepatitis C could be contracted by blood transfusions. Since testing of blood has improved, this is now unlikely. This infection can also be contracted through intravenous drug use and the sharing of needles. It can also be contracted through sexual relationships. The injury caused by alcohol comes from too much use. It is not a few drinks that poison the liver, but years of misuse. Usually there will be some signs and symptoms early with scarring of the liver that suggest the development of better habits. Alcohol should never be used while using acetaminophen. A small dose of both taken together may cause irreversible damage to the liver. HOME CARE INSTRUCTIONS  There is no specific treatment for cirrhosis. However, there are things you can do to avoid making the condition worse.  Rest as needed.  Eat a well-balanced diet. Your caregiver can help you with suggestions.  Vitamin supplements including vitamins A, K, D, and thiamine can help.  A low-salt diet, water restriction, or diuretic medicine may be needed to reduce fluid retention.  Avoid alcohol. This can be extremely toxic if combined with acetaminophen.  Avoid drugs which are toxic to the liver. Some of these include isoniazid,  methyldopa, acetaminophen, anabolic steroids (muscle-building drugs), erythromycin, and oral contraceptives (birth control pills). Check with your caregiver to make sure medicines you are presently taking will not be harmful.  Periodic blood tests may be required. Follow your caregiver's advice regarding the timing of these.  Milk thistle is an herbal remedy which does protect the liver against toxins. However, it will not help once the liver has been scarred. SEEK MEDICAL CARE IF:  You have increasing fatigue or weakness.  You develop swelling of the hands, feet, legs, or face.  You vomit bright red blood, or a coffee ground appearing material.  You have blood in your stools, or the stools turn black and tarry.  You have a fever.  You develop loss of appetite, or have nausea and vomiting.  You develop jaundice.  You develop easy bruising or bleeding.  You have worsening of any of the problems you are concerned about. Document Released: 12/31/2004 Document Revised: 03/25/2011 Document Reviewed: 08/19/2007 [facility] Patient Information 2015 [place], [place]. This information is not intended to replace advice given to you by your health care provider. Make sure you discuss any questions you have with your health care provider.   Ascites Ascites is a gathering of fluid in the belly (abdomen). This is most often caused by liver disease. It may also be caused by a number of other less common problems. It causes a ballooning out (distension) of the abdomen. CAUSES  Scarring of the liver (cirrhosis) is the most common cause of ascites. Other causes include:  Infection or inflammation in the abdomen.  Cancer in the abdomen.  Heart failure.  Certain forms of kidney failure (nephritic syndrome).  Inflammation of the pancreas.  Clots in the veins of the liver. SYMPTOMS  In the early stages of ascites, you may not have any symptoms. The main symptom of ascites is a sense of abdominal  bloating. This is due to the presence of fluid. This may also cause an increase in abdominal or waist size. People with this condition can develop swelling in the legs, and men can develop a swollen scrotum. When there is a lot of fluid, it may be hard to breath. Stretching of the abdomen by fluid can be painful. DIAGNOSIS  Certain features of your medical history, such as a history of liver disease and of an enlarging abdomen, can suggest the presence of ascites. The diagnosis of ascites can be made on physical exam by your caregiver. An abdominal ultrasound examination can confirm that ascites is present, and estimate the amount of fluid. Once ascites is confirmed, it is important to determine its cause. Again, a history of one of the conditions listed in "CAUSES" provides a strong clue. A physical exam is important, and blood and X-ray tests may be needed. During a procedure called paracentesis, a sample of fluid is removed from the abdomen. This can determine certain key features about the fluid, such as whether or not infection or cancer is present. Your caregiver will determine if a paracentesis is necessary. They will describe the procedure to you. PREVENTION  Ascites is a complication of other conditions. Therefore to prevent ascites, you must seek treatment for any significant health conditions you have. Once ascites is present, careful attention to fluid and salt intake may help prevent it from getting worse. If you have ascites, you should not drink alcohol. PROGNOSIS  The prognosis of ascites depends on the underlying disease. If the disease is reversible, such as with certain infections or with heart failure, then ascites may improve or disappear. When ascites is caused by cirrhosis, then it indicates that the liver disease has worsened, and further evaluation and treatment of the liver disease is needed. If your ascites is caused by cancer, then the success or failure of the cancer treatment  will determine whether your ascites will improve or worsen. RISKS AND COMPLICATIONS  Ascites is likely to worsen if it is not properly diagnosed and treated. A large amount of ascites can cause pain and difficulty breathing. The main complication, besides worsening, is infection (called spontaneous bacterial peritonitis). This requires prompt treatment. TREATMENT  The treatment of ascites depends on its cause. When liver disease is your cause, medical management using water pills (diuretics) and decreasing salt intake is often effective. Ascites due to peritoneal inflammation or malignancy (cancer) alone does not respond to salt restriction and diuretics. Hospitalization is sometimes required. If the treatment of ascites cannot be managed with medications, a number of other treatments are available. Your caregivers will help you decide which will work best for you. Some of these are:  Removal of fluid from the abdomen (paracentesis).  Fluid from the abdomen is passed into a vein (peritoneovenous shunting).  Liver transplantation.  Transjugular intrahepatic portosystemic stent shunt. HOME CARE INSTRUCTIONS  It is important to monitor body weight and the intake and output of fluids. Weigh yourself at the same time every day. Record your weights. Fluid restriction may be necessary. It is also important to know your salt intake. The more salt you take in, the more fluid you will retain. Ninety percent of people with ascites  respond to this approach.  Follow any directions for medicines carefully.  Follow up with your caregiver, as directed.  Report any changes in your health, especially any new or worsening symptoms.  If your ascites is from liver disease, avoid alcohol and other substances toxic to the liver. SEEK MEDICAL CARE IF:   Your weight increases more than a few pounds in a few days.  Your abdominal or waist size increases.  You develop swelling in your legs.  You had swelling  and it worsens. SEEK IMMEDIATE MEDICAL CARE IF:   You develop a fever.  You develop new abdominal pain.  You develop difficulty breathing.  You develop confusion.  You have bleeding from the mouth, stomach, or rectum. MAKE SURE YOU:   Understand these instructions.  Will watch your condition.  Will get help right away if you are not doing well or get worse. Document Released: 12/31/2004 Document Revised: 03/25/2011 Document Reviewed: 08/01/2006 [facility] Patient Information 2015 [place], [place]. This information is not intended to replace advice given to you by your health care provider. Make sure you discuss any questions you have with your health care provider.  ABDOMINAL SURGERY: POST OP INSTRUCTIONS  1. DIET: Follow a light bland diet the first 24 hours after arrival home, such as soup, liquids, crackers, etc.  Be sure to include lots of fluids daily.  Avoid fast food or heavy meals as your are more likely to get nauseated.  Eat a low fat the next few days after surgery.   2. Take your usually prescribed home medications unless otherwise directed. 3. PAIN CONTROL: a. Pain is best controlled by a usual combination of three different methods TOGETHER: i. Ice/Heat ii. Over the counter pain medication iii. Prescription pain medication b. Most patients will experience some swelling and bruising around the incisions.  Ice packs or heating pads (30-60 minutes up to 6 times a day) will help. Use ice for the first few days to help decrease swelling and bruising, then switch to heat to help relax tight/sore spots and speed recovery.  Some people prefer to use ice alone, heat alone, alternating between ice & heat.  Experiment to what works for you.  Swelling and bruising can take several weeks to resolve.   c. It is helpful to take an over-the-counter pain medication regularly for the first few weeks.  Choose one of the following that works best for you: i. Naproxen (Aleve, etc)  Two 220mg   tabs twice a day ii. Ibuprofen (Advil, etc) Three 200mg  tabs four times a day (every meal & bedtime) iii. Acetaminophen (Tylenol, etc) 500-650mg  four times a day (every meal & bedtime) d. A  prescription for pain medication (such as oxycodone, hydrocodone, etc) should be given to you upon discharge.  Take your pain medication as prescribed.  i. If you are having problems/concerns with the prescription medicine (does not control pain, nausea, vomiting, rash, itching, etc), please call us [phone] to see if we need to switch you to a different pain medicine that will work better for you and/or control your side effect better. ii. If you need a refill on your pain medication, please contact your pharmacy.  They will contact our office to request authorization. Prescriptions will not be filled after 5 pm or on week-ends. 4. Avoid getting constipated.  Between the surgery and the pain medications, it is common to experience some constipation.  Increasing fluid intake and taking a fiber supplement (such as Metamucil, Citrucel, FiberCon, MiraLax, etc) 1-2  times a day regularly will usually help prevent this problem from occurring.  A mild laxative (prune juice, Milk of Magnesia, MiraLax, etc) should be taken according to package directions if there are no bowel movements after 48 hours.   5. Watch out for diarrhea.  If you have many loose bowel movements, simplify your diet to bland foods & liquids for a few days.  Stop any stool softeners and decrease your fiber supplement.  Switching to mild anti-diarrheal medications (Kayopectate, Pepto Bismol) can help.  If this worsens or does not improve, please call us. 6. Wash / shower every day.  You may shower over the incision / wound.  Avoid baths until the skin is fully healed.  Continue to shower over incision(s) after the dressing is off. 7. Remove your waterproof bandages 5 days after surgery.  You may leave the incision open to air.  Remove any wicks or  ribbons in your wound.  If you have an open wound, please see wound care instructions. You may replace a dressing/Band-Aid to cover the incision for comfort if you wish. 8. ACTIVITIES as tolerated:   a. You may resume regular (light) daily activities beginning the next day--such as daily self-care, walking, climbing stairs--gradually increasing activities as tolerated.  If you can walk 30 minutes without difficulty, it is safe to try more intense activity such as jogging, treadmill, bicycling, low-impact aerobics, swimming, etc. b. Save the most intensive and strenuous activity for last such as sit-ups, heavy lifting, contact sports, etc  Refrain from any heavy lifting or straining until you are off narcotics for pain control.   c. DO NOT PUSH THROUGH PAIN.  Let pain be your guide: If it hurts to do something, don't do it.  Pain is your body warning you to avoid that activity for another week until the pain goes down. d. You may drive when you are no longer taking prescription pain medication, you can comfortably wear a seatbelt, and you can safely maneuver your car and apply brakes. e. Dennis Bast may have sexual intercourse when it is comfortable.  9. FOLLOW UP in our office a. Please call [facility] at [phone] to set up an appointment to see your surgeon in the office for a follow-up appointment approximately 1-2 weeks after your surgery. b. Make sure that you call for this appointment the day you arrive home to insure a convenient appointment time. 10. IF YOU HAVE DISABILITY OR FAMILY LEAVE FORMS, BRING THEM TO THE OFFICE FOR PROCESSING.  DO NOT GIVE THEM TO YOUR DOCTOR.   WHEN TO CALL us [phone]: 1. Poor pain control 2. Reactions / problems with new medications (rash/itching, nausea, etc)  3. Fever over 101.5 F (38.5 C) 4. Inability to urinate 5. Nausea and/or vomiting 6. Worsening swelling or bruising 7. Continued bleeding from incision. 8. Increased pain, redness, or drainage from the  incision  The clinic staff is available to answer your questions during regular business hours (8:30am-5pm).  Please dont hesitate to call and ask to speak to one of our nurses for clinical concerns.   A surgeon from [facility] is always on call at the hospitals   If you have a medical emergency, go to the nearest emergency room or call 911.    [facility], [address], [place], [place], [place]  [address] ? MAIN: [phone] ? TOLL FREE: [phone] ? FAX [phone] [email or web address]  GETTING TO GOOD BOWEL HEALTH. Irregular bowel habits such as  constipation and diarrhea can lead to many problems over time.  Having one soft bowel movement a day is the most important way to prevent further problems.  The anorectal canal is designed to handle stretching and feces to safely manage our ability to get rid of solid waste (feces, poop, stool) out of our body.  BUT, hard constipated stools can act like ripping concrete bricks and diarrhea can be a burning fire to this very sensitive area of our body, causing inflamed hemorrhoids, anal fissures, increasing risk is perirectal abscesses, abdominal pain/bloating, an making irritable bowel worse.     The goal: ONE SOFT BOWEL MOVEMENT A DAY!  To have soft, regular bowel movements:   Drink at least 8 tall glasses of water a day.    Take plenty of fiber.  Fiber is the undigested part of plant food that passes into the colon, acting s natures broom to encourage bowel motility and movement.  Fiber can absorb and hold large amounts of water. This results in a larger, bulkier stool, which is soft and easier to pass. Work gradually over several weeks up to 6 servings a day of fiber (25g a day even more if needed) in the form of: o Vegetables -- Root (potatoes, carrots, turnips), leafy green (lettuce, salad greens, celery, spinach), or cooked high residue (cabbage, broccoli, etc) o Fruit -- Fresh  (unpeeled skin & pulp), Dried (prunes, apricots, cherries, etc ),  or stewed ( applesauce)  o Whole grain breads, pasta, etc (whole wheat)  o Bran cereals   Bulking Agents -- This type of water-retaining fiber generally is easily obtained each day by one of the following:  o Psyllium bran -- The psyllium plant is remarkable because its ground seeds can retain so much water. This product is available as Metamucil, Konsyl, Effersyllium, Per Diem Fiber, or the less expensive generic preparation in drug and health food stores. Although labeled a laxative, it really is not a laxative.  o Methylcellulose -- This is another fiber derived from wood which also retains water. It is available as Citrucel. o Polyethylene Glycol - and artificial fiber commonly called Miralax or Glycolax.  It is helpful for people with gassy or bloated feelings with regular fiber o Flax Seed - a less gassy fiber than psyllium  No reading or other relaxing activity while on the toilet. If bowel movements take longer than 5 minutes, you are too constipated  AVOID CONSTIPATION.  High fiber and water intake usually takes care of this.  Sometimes a laxative is needed to stimulate more frequent bowel movements, but   Laxatives are not a good long-term solution as it can wear the colon out. o Osmotics (Milk of Magnesia, Fleets phosphosoda, Magnesium citrate, MiraLax, GoLytely) are safer than  o Stimulants (Senokot, Castor Oil, Dulcolax, Ex Lax)    o Do not take laxatives for more than 7days in a row.   IF SEVERELY CONSTIPATED, try a Bowel Retraining Program: o Do not use laxatives.  o Eat a diet high in roughage, such as bran cereals and leafy vegetables.  o Drink six (6) ounces of prune or apricot juice each morning.  o Eat two (2) large servings of stewed fruit each day.  o Take one (1) heaping tablespoon of a psyllium-based bulking agent twice a day. Use sugar-free sweetener when possible to avoid excessive calories.  o Eat  a normal breakfast.  o Set aside 15 minutes after breakfast to sit on the toilet, but do not strain to  have a bowel movement.  o If you do not have a bowel movement by the third day, use an enema and repeat the above steps.   Controlling diarrhea o Switch to liquids and simpler foods for a few days to avoid stressing your intestines further. o Avoid dairy products (especially milk & ice cream) for a short time.  The intestines often can lose the ability to digest lactose when stressed. o Avoid foods that cause gassiness or bloating.  Typical foods include beans and other legumes, cabbage, broccoli, and dairy foods.  Every person has some sensitivity to other foods, so listen to our body and avoid those foods that trigger problems for you. o Adding fiber (Citrucel, Metamucil, psyllium, Miralax) gradually can help thicken stools by absorbing excess fluid and retrain the intestines to act more normally.  Slowly increase the dose over a few weeks.  Too much fiber too soon can backfire and cause cramping & bloating. o Probiotics (such as active yogurt, Align, etc) may help repopulate the intestines and colon with normal bacteria and calm down a sensitive digestive tract.  Most studies show it to be of mild help, though, and such products can be costly. o Medicines: - Bismuth subsalicylate (ex. Kayopectate, Pepto Bismol) every 30 minutes for up to 6 doses can help control diarrhea.  Avoid if pregnant. - Loperamide (Immodium) can slow down diarrhea.  Start with two tablets (4mg  total) first and then try one tablet every 6 hours.  Avoid if you are having fevers or severe pain.  If you are not better or start feeling worse, stop all medicines and call your doctor for advice o Call your doctor if you are getting worse or not better.  Sometimes further testing (cultures, endoscopy, X-ray studies, bloodwork, etc) may be needed to help diagnose and treat the cause of the diarrhea. o

## 2014-05-01 NOTE — Progress Notes (Signed)
[place] [facility]  [address] [place], [place] [address] Phone: [phone] FAX: [place] [phone] 1945/05/29  CARE TEAM:  PCP: [name], MD  Outpatient Care Team: Patient Care Team: Kathyrn Lass, MD as PCP - General (Family Medicine) Bjorn Loser, MD as Consulting Physician (Urology) Laurence Spates, MD as Consulting Physician (Gastroenterology) Clarene Essex, MD as Consulting Physician (Gastroenterology) Renette Butters, MD as Consulting Physician (Orthopedic Surgery) Rozetta Nunnery, MD as Consulting Physician (Otolaryngology) Lenn Cal, DDS as Consulting Physician (Dentistry) Larey Dresser, MD as Consulting Physician (Cardiology)  Inpatient Treatment Team: Treatment Team: Attending Provider: Orson Eva, MD; Rounding Team: Ian Bushman, MD; Technician: Wilder Glade, NT; Registered Nurse: Dala Dock, RN; Registered Nurse: Lolita Rieger, RN; Consulting Physician: Laurence Spates, MD; Registered Nurse: Milta Deiters, RN; Consulting Physician: Bjorn Loser, MD; Consulting Physician: Nolon Nations, MD; Respiratory Therapist: Nelly Laurence, RRT; Registered Nurse: Aldean Ast, RN  Problem List:   Principal Problem:   Pneumatosis intestinalis Active Problems:   Liver cirrhosis secondary to NASH   Ascites   Anemia   Thrombocytopenia   Hyperammonemia   Compression fracture   Ileus   Encephalopathy, hepatic   HCAP (healthcare-associated pneumonia)        Assessment  Pneumatosis of ileum on CT scan with worsening abdominal apin, probable closed loop SBO  Plan:  EMERGENT OR EXPLORATION, probable bowel resection / ostomy.  The anatomy & physiology of the digestive tract was discussed.  The pathophysiology of perforation was discussed.  Differential diagnosis such as perforated ulcer or colon, etc was discussed.   Natural history risks without surgery such as death was discussed.   I recommended abdominal exploration to diagnose & treat the source of the problem.  Laparoscopic & open techniques were discussed.   Risks such as bleeding, infection, abscess, leak, reoperation, bowel resection, possible ostomy, hernia, heart attack, death, and other risks were discussed.   The risks of no intervention will lead to serious problems including death.   I expressed a good likelihood that surgery will address the problem.  Cirrhosis with portal hypertension raises the risks markedly, unfortunately.   Goals of post-operative recovery were discussed as well.  We will work to minimize complications although risks in an emergent setting are high.   Questions were answered.  The patient expressed understanding & wishes to proceed with surgery.      ICU BED POSTOP  PCCM HELP POST-OP       Adin Hector, M.D., F.A.C.S. Gastrointestinal and Minimally Invasive Surgery [facility] [address], [place], [place] [address] [phone] Main / Paging   04/29/2014  Subjective:  Feels worse Pain more sharp Wife at bedside Called by radiology 7:21pm w CT scan results of SBO w pnemuatosis - prob SB ischemia/necrosis  Objective:  Vital signs:  Filed Vitals:   04/30/14 1409 04/30/14 2139 04/25/2014 0551 04/30/2014 1426  BP: 110/61 105/58 109/59 102/53  Pulse: 90 95 94 88  Temp: 98.4 F (36.9 C) 98.4 F (36.9 C) 98.1 F (36.7 C) 98.2 F (36.8 C)  TempSrc: Oral Oral Oral Oral  Resp: 18 18 18 18   Height:      Weight:      SpO2: 95% 94% 91% 96%    Last BM Date: 04/29/14  Intake/Output   Yesterday:  04/16 0701 - 04/17 0700 In: 720 [P.O.:720] Out: 800 [Urine:800] This shift:  Bowel function:  Flatus: n  BM: n  Drain: thin bilious  Physical Exam:  General: Pt awake/alert/oriented x4 in mod/severe distress Eyes: PERRL, normal EOM.  Sclera clear.  No icterus Neuro: CN II-XII intact w/o focal sensory/motor deficits. Lymph: No  head/neck/groin lymphadenopathy Psych:  No delerium/psychosis/paranoia HENT: Normocephalic, Mucus membranes moist.  No thrush Neck: Supple, No tracheal deviation Chest:  No chest wall pain w good excursion CV:  Pulses intact.  Regular rhythm MS: Normal AROM mjr joints.  No obvious deformity Abdomen: More firm.  Very distended.  Mod/severe tender mid/lower abdomen w guarding.  No incarcerated hernias. Ext:  SCDs BLE.  No mjr edema.  No cyanosis Skin: No petechiae / purpura  Results:   Labs: Results for orders placed or performed during the hospital encounter of 04/18/2014 (from the past 48 hour(s))  Urinalysis, Routine w reflex microscopic     Status: Abnormal   Collection Time: 04/30/14  6:56 PM  Result Value Ref Range   Color, Urine ORANGE (A) YELLOW    Comment: BIOCHEMICALS MAY BE AFFECTED BY COLOR   APPearance CLEAR CLEAR   Specific Gravity, Urine 1.021 1.005 - 1.030   pH 6.0 5.0 - 8.0   Glucose, UA NEGATIVE NEGATIVE mg/dL   Hgb urine dipstick LARGE (A) NEGATIVE   Bilirubin Urine NEGATIVE NEGATIVE   Ketones, ur NEGATIVE NEGATIVE mg/dL   Protein, ur NEGATIVE NEGATIVE mg/dL   Urobilinogen, UA 0.2 0.0 - 1.0 mg/dL   Nitrite NEGATIVE NEGATIVE   Leukocytes, UA SMALL (A) NEGATIVE  Urine microscopic-add on     Status: None   Collection Time: 04/30/14  6:56 PM  Result Value Ref Range   Squamous Epithelial / LPF RARE RARE   WBC, UA 3-6 <3 WBC/hpf   RBC / HPF 11-20 <3 RBC/hpf   Bacteria, UA RARE RARE   Urine-Other MUCOUS PRESENT   Comprehensive metabolic panel     Status: Abnormal   Collection Time: 05/14/2014  5:04 AM  Result Value Ref Range   Sodium 129 (L) 135 - 145 mmol/L   Potassium 4.2 3.5 - 5.1 mmol/L   Chloride 100 96 - 112 mmol/L   CO2 24 19 - 32 mmol/L   Glucose, Bld 117 (H) 70 - 99 mg/dL   BUN 8 6 - 23 mg/dL   Creatinine, Ser 0.72 0.50 - 1.35 mg/dL   Calcium 7.7 (L) 8.4 - 10.5 mg/dL   Total Protein 5.4 (L) 6.0 - 8.3 g/dL   Albumin 2.0 (L) 3.5 - 5.2 g/dL   AST 36 0 -  37 U/L   ALT 23 0 - 53 U/L   Alkaline Phosphatase 146 (H) 39 - 117 U/L   Total Bilirubin 3.9 (H) 0.3 - 1.2 mg/dL   GFR calc non Af Amer >90 >90 mL/min   GFR calc Af Amer >90 >90 mL/min    Comment: (NOTE) The eGFR has been calculated using the CKD EPI equation. This calculation has not been validated in all clinical situations. eGFR's persistently <90 mL/min signify possible Chronic Kidney Disease.    Anion gap 5 5 - 15  Protime-INR     Status: Abnormal   Collection Time: 05/03/2014  5:04 AM  Result Value Ref Range   Prothrombin Time 18.3 (H) 11.6 - 15.2 seconds   INR 1.51 (H) 0.00 - 1.49  CBC     Status: Abnormal   Collection Time: 05/04/2014  9:43 AM  Result Value Ref Range   WBC 10.7 (H) 4.0 - 10.5 K/uL  RBC 3.08 (L) 4.22 - 5.81 MIL/uL   Hemoglobin 9.7 (L) 13.0 - 17.0 g/dL   HCT 29.3 (L) 39.0 - 52.0 %   MCV 95.1 78.0 - 100.0 fL   MCH 31.5 26.0 - 34.0 pg   MCHC 33.1 30.0 - 36.0 g/dL   RDW 18.2 (H) 11.5 - 15.5 %   Platelets 132 (L) 150 - 400 K/uL  Lactic acid, plasma     Status: Abnormal   Collection Time: 04/29/2014  9:43 AM  Result Value Ref Range   Lactic Acid, Venous 2.7 (HH) 0.5 - 2.0 mmol/L    Comment: CRITICAL RESULT CALLED TO, READ BACK BY AND VERIFIED WITH: DUMAS,N AT 10:40AM ON 04/18/2014 BY FESTERMAN,C   Lactic acid, plasma     Status: Abnormal   Collection Time: 04/17/2014 12:11 PM  Result Value Ref Range   Lactic Acid, Venous 3.5 (HH) 0.5 - 2.0 mmol/L    Comment: CRITICAL RESULT CALLED TO, READ BACK BY AND VERIFIED WITH: DUMAS,N AT 1:10PM ON 04/30/2014 BY [facility]     Imaging / Studies: Ct Abdomen Pelvis W Wo Contrast  04/30/2014   CLINICAL DATA:  Nausea and vomiting and abdominal distention  EXAM: CT ABDOMEN AND PELVIS WITHOUT AND WITH CONTRAST  TECHNIQUE: Multidetector CT imaging of the abdomen and pelvis was performed following the standard protocol before and following the bolus administration of intravenous contrast.  CONTRAST:  130m OMNIPAQUE IOHEXOL 300  MG/ML  SOLN  COMPARISON:  None.  FINDINGS: Lung bases demonstrate bibasilar pneumonia of right worse than left with small associated pleural effusions.  The liver is small and nodular consistent with underlying cirrhotic change. No focal mass lesion is noted. The gallbladder is well distended. The spleen, adrenal glands and pancreas are within normal limits.  The kidneys are well visualized bilaterally. A nonobstructing left renal calculus is noted. Parapelvic cyst is noted in the upper portion of the left kidney. Normal enhancement is noted otherwise. The bladder is partially distended with opacified and non-opacified urine. No definitive bladder mass is seen.  Diffuse ascites is noted consistent with the known portal hypertension. 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 There is a spontaneous decompression of this dilated mesenteric vein into the inferior vena cava. Within these varices as well as within branches of the superior mesenteric vein there is evidence of air although no portal venous air is noted within the liver. These changes suggest bowel ischemia with changes of pneumatosis noted within multiple dilated small bowel loops. A transition zone is noted in the distal ileum just before the terminal ileum. No definitive mass lesion is seen. The etiology of this transition zone is uncertain.  Diverticular change of the sigmoid colon is seen without evidence of diverticulitis. Postsurgical changes are noted in the left hip. Diffuse degenerative changes of the lumbar spine are seen. Compression deformities are again identified within the lumbar spine of a chronic nature.  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 Surgical consultation is recommended.  Cirrhotic change of the liver with associated changes of portal hypertension to include the previously described varices as well as ascites.  There are no findings to account for the patient's history of hematuria.  Critical Value/emergent results were called by  telephone at the time of interpretation on 05/12/2014 at 7:11 pm to Dr. Johney Maine, who verbally acknowledged these results.   Electronically Signed   By: Inez Catalina M.D.   On: 04/17/2014 19:27   Dg Chest 1 View  04/26/2014   CLINICAL DATA:  Cough this morning; Ileus. Dyspnea.  EXAM: CHEST  1 VIEW  COMPARISON:  04/07/2014  FINDINGS: Cardiac silhouette is borderline enlarged. No mediastinal or hilar masses. No convincing adenopathy.  There is hazy airspace opacity projecting in the right mid lung, new from prior exam. Small area of pneumonia is suspected. There is mild linear opacity at the left lung base consistent with scarring and/or atelectasis. This is stable.  Remainder of the lungs is clear. No pleural effusion or pneumothorax.  Bony thorax is demineralized but grossly intact.  IMPRESSION: Small area of right mid lung, likely right upper lobe, hazy airspace opacity consistent with pneumonia given the history of cough.   Electronically Signed   By: Lajean Manes M.D.   On: 04/15/2014 09:34   Dg Abd 2 Views  05/12/2014   CLINICAL DATA:  Abdominal distention, nausea and vomiting today.  EXAM: ABDOMEN - 2 VIEW  COMPARISON:  Previous imaging examinations.  FINDINGS: Multiple dilated small bowel loops containing multiple air-fluid levels. Gas and stool in mildly prominent right colon and hepatic flexure. No more distal colon gas seen other than some gas in the rectum. No free peritoneal air. Left hip fixation hardware. Lumbar and lower thoracic spine degenerative changes.  IMPRESSION: Partial small bowel obstruction. Colonic obstruction at the level of the transverse colon is  less likely.   Electronically Signed   By: Claudie Revering M.D.   On: 04/29/2014 09:27    Medications / Allergies: per chart  Antibiotics: Anti-infectives    Start     Dose/Rate Route Frequency Ordered Stop   05/03/2014 2200  vancomycin (VANCOCIN) IVPB 1000 mg/200 mL premix     1,000 mg 200 mL/hr over 60 Minutes Intravenous Every 12 hours 04/24/2014 1020     04/16/2014 1400  imipenem-cilastatin (PRIMAXIN) 500 mg in sodium chloride 0.9 % 100 mL IVPB  Status:  Discontinued     500 mg 200 mL/hr over 30 Minutes Intravenous 3 times per day 04/19/2014 0958 05/05/2014 1025   04/30/2014 1200  imipenem-cilastatin (PRIMAXIN) 500 mg in sodium chloride 0.9 % 100 mL IVPB     500 mg 200 mL/hr over 30 Minutes Intravenous 4 times per day 04/18/2014 1025     05/05/2014 1100  vancomycin (VANCOCIN) 2,000 mg in sodium chloride 0.9 % 500 mL IVPB     2,000 mg 250 mL/hr over 120 Minutes Intravenous  Once 04/21/2014 1020 05/04/2014 1330   04/15/2014 2200  rifaximin (XIFAXAN) tablet 550 mg  Status:  Discontinued     550 mg Oral 2 times daily 04/20/2014 1937 04/17/2014 0834       Note: Portions of this report may have been transcribed using voice recognition software. Every effort was made to ensure accuracy; however, inadvertent computerized transcription errors may be present.   Any transcriptional errors that result from this process are unintentional.     Adin Hector, M.D., F.A.C.S. Gastrointestinal and Minimally Invasive Surgery [facility] [address] [place], [place] [address] [phone] Main / Paging   05/09/2014

## 2014-05-01 NOTE — Op Note (Addendum)
04/30/2014  10:48 PM  PATIENT:  Clifford Cook  69 y.o. male  Patient Care Team: Kathyrn Lass, MD as PCP - General (Family Medicine) Bjorn Loser, MD as Consulting Physician (Urology) Laurence Spates, MD as Consulting Physician (Gastroenterology) Clarene Essex, MD as Consulting Physician (Gastroenterology) Renette Butters, MD as Consulting Physician (Orthopedic Surgery) Rozetta Nunnery, MD as Consulting Physician (Otolaryngology) Lenn Cal, DDS as Consulting Physician (Dentistry) Larey Dresser, MD as Consulting Physician (Cardiology)  PRE-OPERATIVE DIAGNOSIS:    Small bowel obstruction, probable pneumatosis  POST-OPERATIVE DIAGNOSIS:    Small bowel obstruction Cirrhosis with ascites  PROCEDURE:    EXPLORATORY LAPAROTOMY OPEN LYSIS OF ADHESIONS PARACENTESIS x 3L  SURGEON:  Surgeon(s): Michael Boston, MD  ASSISTANT: RN  ANESTHESIA:   general  EBL:  Total I/O In: 4098 [I.V.:3000; Blood:440] Out: 47 [Other:2300]  Delay start of Pharmacological VTE agent (>24hrs) due to surgical blood loss or risk of bleeding:  no  DRAINS: none   SPECIMEN:  No Specimen  DISPOSITION OF SPECIMEN:  N/A  COUNTS:  YES  PLAN OF CARE: Admit to inpatient   PATIENT DISPOSITION:  ICU - intubated and critically ill.  INDICATION: Patient with cirrhosis and massive abdominal distention.  Nausea vomiting and obstipation concerning for ileus versus bowel obstruction.  CT scan concerning for transition point and distal ileum with gas and pneumatosis.  Patient with worsening abdominal pain and tachycardia suspicious for septic shock.  I recommended emergent abdominal exploration with possible bowel resection.  The anatomy & physiology of the digestive tract was discussed.  The pathophysiology of perforation was discussed.  Differential diagnosis such as perforated ulcer or colon, etc was discussed.   Natural history risks without surgery such as death was discussed.  I recommended  abdominal exploration to diagnose & treat the source of the problem.  Laparoscopic & open techniques were discussed.   Risks such as bleeding, infection, abscess, leak, reoperation, bowel resection, possible ostomy, hernia, heart attack, death, and other risks were discussed.   The risks of no intervention will lead to serious problems including death.   I expressed a good likelihood that surgery will address the problem.    Goals of post-operative recovery were discussed as well.  We will work to minimize complications although risks in an emergent setting are high.   Questions were answered.  The patient expressed understanding & wishes to proceed with surgery.      OR FINDINGS:    Adhesion from sigmoid colon epiploic appendage to right dome of bladder with ileum underneath it.  Most likely source of transition point.    No bowel ischemia.  No pneumatosis.  No necrosis.  No obstructing tumor.  No perforation.  No peritonitis.  Proximal small bowel massively dilated.  Edema of stomach small intestines and colon.  No obstructing colonic mass.  Mild colonic dilation diffuse suspicious for ileus.  3 L of clear ascites primarily in the pelvis.   DESCRIPTION:   Informed consent was confirmed.  Patient underwent general anesthesia without difficulty.  Second IV had been placed.  Both at the antecubital fossas.  Arterial line placed.  The catheter placed.  Position supine with SCDs.  Abdomen prepped and draped in a sterile fashion.  Surgical timeout confirmed our plan.  I entered the abdomen through a supraumbilical midline incision.  Continued a little infraumbilically.  Abdominal wall was quite thinned out.  Immediately aspirated over 2 L of ascites.  Had to extend the incision since the small bowel  was very dilated and thickened.  I eviscerated most the small bowel.  I followed down to the ileum and reached down the pelvis.  I helped bring it up without much difficulty.  A band was noted from these mid  sigmoid colon to the dome of the bladder with an epiploic appendage.  This was transected from both ends.  This seemed to be the most likely source of a bowel obstruction since small bowel was trapped by the band.  There is no evidence of any necrosis or ischemia.  There is no perforation.  There is no pneumatosis.  Nasogastric tube was barely in the stomach.  It was not functioning well.  We therefore upsized it.   Immediately aspirated 800 mL of fluid.  I milked back the very dilated bowel more proximally until as much better decompressed.  Inspected the colon from the cecum to the peritoneal reflection.  Colon was mildly dilated.  No transition point.  No mass or tumor.  Appendix not inflamed.   There was an epiploic appendage in the distal transverse colon that was stuck to the omentum.  Somewhat pinching off the colon but no definite transition point there.  Nonetheless, this was trimmed.  I again reinspected the bowel numerous times and could find no evidence of pneumatosis, perforation, ischemia, necrosis, obstruction, tumor.  Gallbladder normal.  Liver very fibrotic consistent with end-stage cirrhosis.  No obvious mass.   I let the small bowel return in the abdomen.  Greater omentum reached down to the umbilicus but was not of a large volume.  The abdomen was markedly decompressed now.  The abdomen was closed with #1 looped PDS at the fascia.  Skin staples.  I placed a sterile dressing.  Patient is critical condition but hemodynamically improved with mean pressures in the 60s.  Plan is to have triple-lumen catheter placed by anesthesia.  I also updated pulmonary / critical care before and after the surgery.  They will be involved.  We will keep the patient intubated and follow closely.  Adin Hector, M.D., F.A.C.S. Gastrointestinal and Minimally Invasive Surgery [facility] [address] [place], [place] [address] [phone] Main /  Paging

## 2014-05-02 ENCOUNTER — Inpatient Hospital Stay (HOSPITAL_COMMUNITY): Payer: Medicare Other

## 2014-05-02 ENCOUNTER — Encounter (HOSPITAL_COMMUNITY): Payer: Self-pay | Admitting: Surgery

## 2014-05-02 DIAGNOSIS — J9601 Acute respiratory failure with hypoxia: Secondary | ICD-10-CM

## 2014-05-02 DIAGNOSIS — K5669 Other intestinal obstruction: Secondary | ICD-10-CM

## 2014-05-02 DIAGNOSIS — K56609 Unspecified intestinal obstruction, unspecified as to partial versus complete obstruction: Secondary | ICD-10-CM

## 2014-05-02 LAB — BLOOD GAS, ARTERIAL
Acid-Base Excess: 1.5 mmol/L (ref 0.0–2.0)
Acid-base deficit: 15.5 mmol/L — ABNORMAL HIGH (ref 0.0–2.0)
Acid-base deficit: 14.4 mmol/L — ABNORMAL HIGH (ref 0.0–2.0)
Acid-base deficit: 1.3 mmol/L (ref 0.0–2.0)
BICARBONATE: 13.8 meq/L — AB (ref 20.0–24.0)
Bicarbonate: 12.8 mEq/L — ABNORMAL LOW (ref 20.0–24.0)
Bicarbonate: 22.3 mEq/L (ref 20.0–24.0)
Bicarbonate: 24.7 mEq/L — ABNORMAL HIGH (ref 20.0–24.0)
DRAWN BY: 422461
DRAWN BY: 295031
Drawn by: 422461
Drawn by: 31814
FIO2: 0.8 %
FIO2: 1 %
FIO2: 0.7 %
FIO2: 0.4 %
LHR: 25 {breaths}/min
LHR: 25 {breaths}/min
MECHVT: 550 mL
O2 SAT: 96.8 %
O2 SAT: 94.9 %
O2 Saturation: 98.3 %
O2 Saturation: 99 %
PATIENT TEMPERATURE: 98.9
PCO2 ART: 30.2 mmHg — AB (ref 35.0–45.0)
PCO2 ART: 34.8 mmHg — AB (ref 35.0–45.0)
PCO2 ART: 47.9 mmHg — AB (ref 35.0–45.0)
PEEP: 5 cmH2O
PEEP: 5 cmH2O
PEEP: 5 cmH2O
PEEP: 5 cmH2O
PH ART: 7.082 — AB (ref 7.350–7.450)
PO2 ART: 87.3 mmHg (ref 80.0–100.0)
Patient temperature: 97.3
Patient temperature: 91.8
Patient temperature: 98.6
RATE: 15 resp/min
RATE: 25 resp/min
TCO2: 12.6 mmol/L (ref 0–100)
TCO2: 13.9 mmol/L (ref 0–100)
TCO2: 22.9 mmol/L (ref 0–100)
TCO2: 20.9 mmol/L (ref 0–100)
VT: 550 mL
VT: 550 mL
VT: 550 mL
pCO2 arterial: 34.8 mmHg — ABNORMAL LOW (ref 35.0–45.0)
pH, Arterial: 7.225 — ABNORMAL LOW (ref 7.350–7.450)
pH, Arterial: 7.423 (ref 7.350–7.450)
pH, Arterial: 7.465 — ABNORMAL HIGH (ref 7.350–7.450)
pO2, Arterial: 158 mmHg — ABNORMAL HIGH (ref 80.0–100.0)
pO2, Arterial: 110 mmHg — ABNORMAL HIGH (ref 80.0–100.0)
pO2, Arterial: 71.6 mmHg — ABNORMAL LOW (ref 80.0–100.0)

## 2014-05-02 LAB — TYPE AND SCREEN
ABO/RH(D): O NEG
Antibody Screen: NEGATIVE
Unit division: 0
Unit division: 0
Unit division: 0

## 2014-05-02 LAB — PREPARE FRESH FROZEN PLASMA
UNIT DIVISION: 0
Unit division: 0

## 2014-05-02 LAB — BASIC METABOLIC PANEL
Anion gap: 9 (ref 5–15)
Anion gap: 5 (ref 5–15)
BUN: 17 mg/dL (ref 6–23)
BUN: 22 mg/dL (ref 6–23)
CALCIUM: 6.9 mg/dL — AB (ref 8.4–10.5)
CO2: 23 mmol/L (ref 19–32)
CO2: 23 mmol/L (ref 19–32)
CREATININE: 1.27 mg/dL (ref 0.50–1.35)
Calcium: 7 mg/dL — ABNORMAL LOW (ref 8.4–10.5)
Chloride: 102 mmol/L (ref 96–112)
Chloride: 101 mmol/L (ref 96–112)
Creatinine, Ser: 1.51 mg/dL — ABNORMAL HIGH (ref 0.50–1.35)
GFR, EST AFRICAN AMERICAN: 65 mL/min — AB (ref >90)
GFR, EST AFRICAN AMERICAN: 53 mL/min — AB (ref >90)
GFR, EST NON AFRICAN AMERICAN: 56 mL/min — AB (ref >90)
GFR, EST NON AFRICAN AMERICAN: 45 mL/min — AB (ref >90)
GLUCOSE: 215 mg/dL — AB (ref 70–99)
Glucose, Bld: 151 mg/dL — ABNORMAL HIGH (ref 70–99)
POTASSIUM: 4.1 mmol/L (ref 3.5–5.1)
Potassium: 4 mmol/L (ref 3.5–5.1)
SODIUM: 129 mmol/L — AB (ref 135–145)
Sodium: 134 mmol/L — ABNORMAL LOW (ref 135–145)

## 2014-05-02 LAB — CBC
HCT: 31.8 % — ABNORMAL LOW (ref 39.0–52.0)
Hemoglobin: 9.9 g/dL — ABNORMAL LOW (ref 13.0–17.0)
MCH: 30.9 pg (ref 26.0–34.0)
MCHC: 31.1 g/dL (ref 30.0–36.0)
MCV: 99.4 fL (ref 78.0–100.0)
PLATELETS: 193 10*3/uL (ref 150–400)
RBC: 3.2 MIL/uL — ABNORMAL LOW (ref 4.22–5.81)
RDW: 18.7 % — ABNORMAL HIGH (ref 11.5–15.5)
WBC: 14.2 10*3/uL — AB (ref 4.0–10.5)

## 2014-05-02 LAB — COMPREHENSIVE METABOLIC PANEL
ALT: 21 U/L (ref 0–53)
AST: 42 U/L — ABNORMAL HIGH (ref 0–37)
Albumin: 2.1 g/dL — ABNORMAL LOW (ref 3.5–5.2)
Alkaline Phosphatase: 128 U/L — ABNORMAL HIGH (ref 39–117)
Anion gap: 17 — ABNORMAL HIGH (ref 5–15)
BUN: 13 mg/dL (ref 6–23)
CO2: 13 mmol/L — AB (ref 19–32)
Calcium: 7 mg/dL — ABNORMAL LOW (ref 8.4–10.5)
Chloride: 100 mmol/L (ref 96–112)
Creatinine, Ser: 0.98 mg/dL (ref 0.50–1.35)
GFR calc non Af Amer: 82 mL/min — ABNORMAL LOW (ref >90)
GLUCOSE: 236 mg/dL — AB (ref 70–99)
Potassium: 3.8 mmol/L (ref 3.5–5.1)
SODIUM: 130 mmol/L — AB (ref 135–145)
Total Bilirubin: 3.7 mg/dL — ABNORMAL HIGH (ref 0.3–1.2)
Total Protein: 5.4 g/dL — ABNORMAL LOW (ref 6.0–8.3)

## 2014-05-02 LAB — PREPARE RBC (CROSSMATCH)

## 2014-05-02 LAB — VITAMIN D 25 HYDROXY (VIT D DEFICIENCY, FRACTURES): VIT D 25 HYDROXY: 34.6 ng/mL (ref 30.0–100.0)

## 2014-05-02 MED ORDER — SODIUM BICARBONATE 8.4 % IV SOLN
INTRAVENOUS | Status: DC
  Administered 2014-05-02 (×2): via INTRAVENOUS
  Filled 2014-05-02 (×4): qty 150

## 2014-05-02 MED ORDER — ETOMIDATE 2 MG/ML IV SOLN
INTRAVENOUS | Status: AC
  Filled 2014-05-02: qty 10

## 2014-05-02 MED ORDER — FENTANYL CITRATE (PF) 100 MCG/2ML IJ SOLN
50.0000 ug | INTRAMUSCULAR | Status: DC | PRN
  Filled 2014-05-02: qty 2

## 2014-05-02 MED ORDER — FENTANYL CITRATE (PF) 100 MCG/2ML IJ SOLN
50.0000 ug | INTRAMUSCULAR | Status: DC | PRN
  Administered 2014-05-02 – 2014-05-03 (×8): 50 ug via INTRAVENOUS
  Filled 2014-05-02 (×7): qty 2

## 2014-05-02 MED ORDER — HYDROMORPHONE HCL 1 MG/ML IJ SOLN: 0.2500 mg | INTRAMUSCULAR | Status: DC | PRN

## 2014-05-02 MED ORDER — MIDAZOLAM HCL 2 MG/2ML IJ SOLN: 1.0000 mg | INTRAMUSCULAR | Status: DC | PRN

## 2014-05-02 MED ORDER — CETYLPYRIDINIUM CHLORIDE 0.05 % MT LIQD
7.0000 mL | Freq: Four times a day (QID) | OROMUCOSAL | Status: DC
  Administered 2014-05-02 – 2014-05-10 (×25): 7 mL via OROMUCOSAL

## 2014-05-02 MED ORDER — ENOXAPARIN SODIUM 30 MG/0.3ML ~~LOC~~ SOLN
30.0000 mg | SUBCUTANEOUS | Status: DC
  Administered 2014-05-02 – 2014-05-03 (×2): 30 mg via SUBCUTANEOUS
  Filled 2014-05-02 (×2): qty 0.3

## 2014-05-02 MED ORDER — BISACODYL 10 MG RE SUPP
10.0000 mg | Freq: Every day | RECTAL | Status: DC
  Administered 2014-05-02 – 2014-05-09 (×8): 10 mg via RECTAL
  Filled 2014-05-02 (×8): qty 1

## 2014-05-02 MED ORDER — LACTATED RINGERS IV SOLN
INTRAVENOUS | Status: DC
  Administered 2014-05-02 – 2014-05-03 (×3): via INTRAVENOUS
  Administered 2014-05-07: 10 mL/h via INTRAVENOUS

## 2014-05-02 MED ORDER — CHLORHEXIDINE GLUCONATE 0.12 % MT SOLN
15.0000 mL | Freq: Two times a day (BID) | OROMUCOSAL | Status: DC
  Administered 2014-05-02 – 2014-05-09 (×14): 15 mL via OROMUCOSAL
  Filled 2014-05-02 (×14): qty 15

## 2014-05-02 MED ORDER — VANCOMYCIN HCL 1000 MG IV SOLR
1000.0000 mg | INTRAVENOUS | Status: DC | PRN
  Administered 2014-05-01: 1000 mg via INTRAVENOUS

## 2014-05-02 MED ORDER — NOREPINEPHRINE BITARTRATE 1 MG/ML IV SOLN
0.0000 ug/min | INTRAVENOUS | Status: DC
  Administered 2014-05-02: 12 ug/min via INTRAVENOUS
  Administered 2014-05-02: 5 ug/min via INTRAVENOUS
  Administered 2014-05-02: 9 ug/min via INTRAVENOUS
  Administered 2014-05-03: 10 ug/min via INTRAVENOUS
  Filled 2014-05-02 (×4): qty 4

## 2014-05-02 NOTE — Progress Notes (Signed)
CT asymptomatic stone No causes of blood F/up as outpt

## 2014-05-02 NOTE — Consult Note (Signed)
PULMONARY  / CRITICAL CARE MEDICINE CONSULTATION   Name: LORENZO ARSCOTT MRN: [id number] DOB: 1945-05-11    ADMISSION DATE:  04/16/2014 CONSULTATION DATE: 05/02/14  REQUESTING CLINICIAN: Dr. Johney Maine PRIMARY SERVICE: Surgery  CHIEF COMPLAINT:  Small bowel obstruction  BRIEF PATIENT DESCRIPTION: 69yom with PMH of NASH cirrhosis, CKD III, COPD presented on 04/17/2014 with worsening Abd distention and elevated ammonia.  He developed pneumatosis on Abd imaging prompting an emergent ex-lap on 4/17.  We were consulted for ICU comanagement.  SIGNIFICANT EVENTS / STUDIES:  05/05/2014:  Admitted with worsening ascites and abd distention 04/29/14:  MRI L spine with multiple subacute compression fx's 04/15/2014:  Developed ileus vs SBO with vomiting.  CT Abd/ pelvis concerning for pneumatosis.  Taken to OR for exlap.  Transferred to ICU  LINES / TUBES: RIJ TLC:  04/30/2014--> Foley:  05/03/2014-->  CULTURES: Blood cx x 2 from 4/17: Pending Paracentesis fluid cx from 4/15:  NGTD  ANTIBIOTICS: Primaxin:  04/28/2014--> Vanc: 05/02/2014-->  HISTORY OF PRESENT ILLNESS:  69yom with PMH of NASH cirrhosis, CKD III, COPD presented on 05/04/2014 with worsening Abd distention, back pain, and elevated ammonia.  While hospitalized here he developed an ileus vs SBO with vomiting prompting further eval.  His CT Abd as above was concerning for pneumatosis prompting an emergent exlap.  Significant ascites as well as a few adhesions were found intraop but no e/o pneumatosis.  The pt was HD stable and remained intubated and sedated post op.  He did have a significant metabolic acidosis as well prompting initiation of a bicarb gtt.    PAST MEDICAL HISTORY :  Past Medical History  Diagnosis Date  . Stomach ulcer   . GI bleed   . Liver disorder   . Cirrhosis   . Complication of anesthesia     slow to wake up with colonoscopy - 2013   . Dysrhythmia     afib, aflutter - during hospitalization and resolved no cardiac followup   . Pneumonia      septic pneumonia - 10/2013   . Chronic kidney disease     hx of stage III CKD   . Thrombocytopenia   . Pneumonia due to streptococcus, group A 10/31/2013  . Hyponatremia 10/31/2013  . Respiratory failure with hypoxia 10/31/2013  . Rib fracture 10/31/2013  . Hip fracture 12/05/2013  . Atrial flutter 12/08/2013  . Encephalopathy, hepatic   . Abscess of hand    Past Surgical History  Procedure Laterality Date  . Cataract extraction, bilateral    . Surgery for lazy eye      age 65  . Esophagogastroduodenoscopy N/A 04/16/2012    Procedure: ESOPHAGOGASTRODUODENOSCOPY (EGD);  Surgeon: Jeryl Columbia, MD;  Location: [facility];  Service: Endoscopy;  Laterality: N/A;  . Esophagogastroduodenoscopy N/A 04/17/2012    Procedure: ESOPHAGOGASTRODUODENOSCOPY (EGD);  Surgeon: Jeryl Columbia, MD;  Location: [facility];  Service: Endoscopy;  Laterality: N/A;  . Direct laryngoscopy N/A 07/13/2013    Procedure: DIRECT LARYNGOSCOPY WITH EXCISION TUMOR;  Surgeon: Rozetta Nunnery, MD;  Location: [facility];  Service: ENT;  Laterality: N/A;  . Hip pinning,cannulated Left 12/06/2013    Procedure: CANNULATED HIP PINNING;  Surgeon: Renette Butters, MD;  Location: [facility];  Service: Orthopedics;  Laterality: Left;  . Tee without cardioversion N/A 12/13/2013    Procedure: TRANSESOPHAGEAL ECHOCARDIOGRAM (TEE);  Surgeon: Larey Dresser, MD;  Location: [facility];  Service: Cardiovascular;  Laterality: N/A;  . Multiple extractions with alveoloplasty N/A 04/14/2014  Procedure: Extraction of tooth #'s 3,4,5,6,7,8,9,10,11,15,21,22,23,24,25,26,27,28 WITH ALVEOLOPLASTY;  Surgeon: Lenn Cal, DDS;  Location: [facility];  Service: Oral Surgery;  Laterality: N/A;   Prior to Admission medications   Medication Sig Start Date End Date Taking? Authorizing Provider  diphenhydrAMINE (BENADRYL) 25 mg capsule Take 1 capsule (25 mg total) by mouth every 12 (twelve) hours as needed for itching. Patient taking  differently: Take 25 mg by mouth at bedtime.  11/09/13  Yes Barton Dubois, MD  furosemide (LASIX) 80 MG tablet Take one tablet in the am and 1/2 tablet in the PM Patient taking differently: Take 80 mg by mouth 2 (two) times daily.  03/21/14  Yes Charlynne Cousins, MD  lactose free nutrition (BOOST) LIQD Take 237 mLs by mouth daily as needed (Unable to eat).   Yes Historical Provider, MD  lactulose (CHRONULAC) 10 GM/15ML solution Take 30 mLs (20 g total) by mouth daily. 03/21/14  Yes Charlynne Cousins, MD  lidocaine (XYLOCAINE) 2 % solution Use as directed 10 mLs in the mouth or throat daily. 03/21/14  Yes Historical Provider, MD  Magnesium 250 MG TABS Take 250 mg by mouth daily.   Yes Historical Provider, MD  oxyCODONE (OXY IR/ROXICODONE) 5 MG immediate release tablet Take 1 tablet (5 mg total) by mouth every 6 (six) hours as needed for moderate pain. 03/21/14  Yes Charlynne Cousins, MD  pantoprazole (PROTONIX) 40 MG tablet Take 1 tablet (40 mg total) by mouth daily. Patient taking differently: Take 40 mg by mouth 2 (two) times daily.  11/09/13  Yes Barton Dubois, MD  Polysaccharide Iron Complex (POLY-IRON 150 PO) Take 1 capsule by mouth daily.   Yes Historical Provider, MD  rifaximin (XIFAXAN) 550 MG TABS tablet Take 1 tablet (550 mg total) by mouth 2 (two) times daily. 03/21/14  Yes Charlynne Cousins, MD  spironolactone (ALDACTONE) 100 MG tablet Take 100 mg by mouth daily.   Yes Historical Provider, MD  Vitamin D, Ergocalciferol, (DRISDOL) 50000 UNITS CAPS capsule Take 50,000 Units by mouth every 7 (seven) days.   Yes Historical Provider, MD  docusate sodium (COLACE) 100 MG capsule Take 1 capsule (100 mg total) by mouth 2 (two) times daily. Patient not taking: Reported on 01/31/2014 12/06/13   Renette Butters, MD  feeding supplement, RESOURCE BREEZE, (RESOURCE BREEZE) LIQD Take 1 Container by mouth 3 (three) times daily between meals. Patient not taking: Reported on 12/05/2013 11/09/13   Barton Dubois, MD  temazepam (RESTORIL) 7.5 MG capsule Take 1 capsule (7.5 mg total) by mouth at bedtime as needed for sleep. Patient not taking: Reported on 03/16/2014 11/09/13   Blanchie Serve, MD   Allergies  Allergen Reactions  . Pulmicort [Budesonide] Shortness Of Breath    Causes stridor  . Penicillins Other (See Comments)    Rash hives, white spots.    FAMILY HISTORY:  Family History  Problem Relation Age of Onset  . Heart attack Mother   . Cirrhosis Sister    SOCIAL HISTORY:  reports that he has been smoking Cigarettes.  He has been smoking about 1.00 pack per day. He has never used smokeless tobacco. He reports that he does not drink alcohol or use illicit drugs.  REVIEW OF SYSTEMS:  Unable to obtain as pt intubated/ sedated  SUBJECTIVE: Unable to obtain as pt intubated/ sedated  VITAL SIGNS: Temp:  [97.3 F (36.3 C)-98.2 F (36.8 C)] 97.3 F (36.3 C) (04/17 1954) Pulse Rate:  [88-94] 92 (04/17 1954) Resp:  [18-22]  22 (04/17 1954) BP: (102-111)/(53-63) 111/63 mmHg (04/17 1954) SpO2:  [91 %-96 %] 92 % (04/17 1954) FiO2 (%):  [100 %] 100 % (04/18 0005) HEMODYNAMICS:   VENTILATOR SETTINGS: Vent Mode:  [-] PRVC FiO2 (%):  [100 %] 100 % Set Rate:  [15 bmp-20 bmp] 15 bmp Vt Set:  [550 mL] 550 mL PEEP:  [5 cmH20] 5 cmH20 Plateau Pressure:  [19 cmH20] 19 cmH20 INTAKE / OUTPUT: Intake/Output      04/17 0701 - 04/18 0700   P.O. 0   I.V. (mL/kg) 6000 (67.9)   Blood 427   Total Intake(mL/kg) 6427 (72.8)   Urine (mL/kg/hr) 200 (0.1)   Emesis/NG output 1451 (0.7)   Other 3000 (1.4)   Blood 200 (0.1)   Total Output 4851   Net +1576         PHYSICAL EXAMINATION: General: Intubated, sedated Neuro:  Not responsive to pain, sedated HEENT:  PERRL Cardiovascular:  Tachy, reg Lungs:  Decreased in R base with scattered bilateral insp crackles.  No wheezes.  Good air movement elsewhere Abdomen:  Distended, midline bandage in place, no bowel sounds Musculoskeletal:  No  Edema Skin:  Warm, spider angiomas noted on chest  LABS:  CBC  Recent Labs Lab 04/28/14 0535 04/29/14 0513 04/17/2014 0943  WBC 5.0 5.8 10.7*  HGB 7.1* 8.5* 9.7*  HCT 21.2* 25.6* 29.3*  PLT 87* 95* 132*   Coag's  Recent Labs Lab 04/29/14 0513 04/16/2014 0504  INR 1.50* 1.51*   BMET  Recent Labs Lab 04/28/14 0535 04/29/14 0513 05/05/2014 0504  NA 132* 131* 129*  K 3.8 3.8 4.2  CL 103 103 100  CO2 23 23 24   BUN 14 9 8   CREATININE 0.83 0.74 0.72  GLUCOSE 96 99 117*   Electrolytes  Recent Labs Lab 04/28/14 0535 04/29/14 0513 05/04/2014 0504  CALCIUM 7.3* 7.4* 7.7*  MG  --  1.9  --    Sepsis Markers  Recent Labs Lab 04/29/14 0513 05/08/2014 0943 05/11/2014 1211  LATICACIDVEN 1.5 2.7* 3.5*   ABG  Recent Labs Lab 05/07/2014 2150  PHART 7.178*  PCO2ART 34.5*  PO2ART 270.0*   Liver Enzymes  Recent Labs Lab 04/28/14 0535 04/29/14 0513 05/07/2014 0504  AST 27 33 36  ALT 19 22 23   ALKPHOS 123* 125* 146*  BILITOT 2.6* 3.2* 3.9*  ALBUMIN 1.9* 1.9* 2.0*   Cardiac Enzymes No results for input(s): TROPONINI, PROBNP in the last 168 hours. Glucose No results for input(s): GLUCAP in the last 168 hours.  Imaging Ct Abdomen Pelvis W Wo Contrast  05/11/2014   CLINICAL DATA:  Nausea and vomiting and abdominal distention  EXAM: CT ABDOMEN AND PELVIS WITHOUT AND WITH CONTRAST  TECHNIQUE: Multidetector CT imaging of the abdomen and pelvis was performed following the standard protocol before and following the bolus administration of intravenous contrast.  CONTRAST:  156mL OMNIPAQUE IOHEXOL 300 MG/ML  SOLN  COMPARISON:  None.  FINDINGS: Lung bases demonstrate bibasilar pneumonia of right worse than left with small associated pleural effusions.  The liver is small and nodular consistent with underlying cirrhotic change. No focal mass lesion is noted. The gallbladder is well distended. The spleen, adrenal glands and pancreas are within normal limits.  The kidneys are well  visualized bilaterally. A nonobstructing left renal calculus is noted. Parapelvic cyst is noted in the upper portion of the left kidney. Normal enhancement is noted otherwise. The bladder is partially distended with opacified and non-opacified urine. No definitive bladder mass is seen.  Diffuse ascites is noted consistent with the known portal hypertension. 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 There is a spontaneous decompression of this dilated mesenteric vein into the inferior vena cava. Within these varices as well as within branches of the superior mesenteric vein there is evidence of air although no portal venous air is noted within the liver. These changes suggest bowel ischemia with changes of pneumatosis noted within multiple dilated small bowel loops. A transition zone is noted in the distal ileum just before the terminal ileum. No definitive mass lesion is seen. The etiology of this transition zone is uncertain.  Diverticular change of the sigmoid colon is seen without evidence of diverticulitis. Postsurgical changes are noted in the left hip. Diffuse degenerative changes of the lumbar spine are seen. Compression deformities are again identified within the lumbar spine of a chronic nature.  IMPRESSION: Changes of small-bowel obstruction with evidence of pneumatosis within the wall of the small bowel and air within the branches of the superior mesenteric vein as well as a large decompressive varix within the mid abdomen arising from the inferior mesenteric vein. Surgical consultation is recommended.  Cirrhotic change of the liver with associated changes of portal hypertension to include the previously described varices as well as ascites.  There are no findings to account for the patient's history of hematuria.  Critical Value/emergent results were called by telephone at the time of interpretation on  05/14/2014 at 7:11 pm to Dr. Johney Maine, who verbally acknowledged these results.   Electronically Signed   By: Inez Catalina M.D.   On: 04/15/2014 19:27   Dg Chest 1 View  04/23/2014   CLINICAL DATA:  Cough this morning; Ileus. Dyspnea.  EXAM: CHEST  1 VIEW  COMPARISON:  04/07/2014  FINDINGS: Cardiac silhouette is borderline enlarged. No mediastinal or hilar masses. No convincing adenopathy.  There is hazy airspace opacity projecting in the right mid lung, new from prior exam. Small area of pneumonia is suspected. There is mild linear opacity at the left lung base consistent with scarring and/or atelectasis. This is stable.  Remainder of the lungs is clear. No pleural effusion or pneumothorax.  Bony thorax is demineralized but grossly intact.  IMPRESSION: Small area of right mid lung, likely right upper lobe, hazy airspace opacity consistent with pneumonia given the history of cough.   Electronically Signed   By: Lajean Manes M.D.   On: 04/19/2014 09:34   Dg Abd 2 Views  05/03/2014   CLINICAL DATA:  Abdominal distention, nausea and vomiting today.  EXAM: ABDOMEN - 2 VIEW  COMPARISON:  Previous imaging examinations.  FINDINGS: Multiple dilated small bowel loops containing multiple air-fluid levels. Gas and stool in mildly prominent right colon and hepatic flexure. No more distal colon gas seen other than some gas in the rectum. No free peritoneal air. Left hip fixation hardware. Lumbar and lower thoracic spine degenerative changes.  IMPRESSION: Partial small bowel obstruction. Colonic obstruction at the level of the transverse colon is less likely.   Electronically Signed   By: Claudie Revering M.D.   On: 04/21/2014 09:27     CXR: R midlung and basilar opacities, increased vascular congestion.    ASSESSMENT / PLAN:  PULMONARY A: Intubated for OR procedure now with severe metabolic acidosis  H/o COPD ??PNA P:   Cont MV with PRVC at 8cc/kg Daily SBT Wean Fi02 for sats >88% Prn duonebs VAP bundle Pt  already on  broad Abx, will cont vanc, primaxin Check Trach aspirate  CARDIOVASCULAR A: Tachycardia Intraop hypotension, now resolved s/p 6L IVF in OR  P:   No further fluids NE to keep MAPS >65 prn ABX as above Monitoring CVP  RENAL A: Severe metabolic acidosis suspect 2/2 increased lactate in setting of liver disease and possible SBO Chronic hyponatremia likely 2/2 liver dz  P:   Will increase RR and to assist with pulm compensation Started bicarb gtt Monitor UOP closely Monitor BMPs daily Hopeful pt's acidosis will improve over next 1-2 days.  If so, can likely extubate at that time.   GASTROINTESTINAL A: Possible SBO with pneumatosis but no e/o pneumatosis on ex-lap.  Now s/p lysis of adhesions.  No intrabdominal soilage noted.   P:   GI surgery following closely Cont broad Abx with vanc/ primaxin Blood cx pending  HEMATOLOGIC A: Anemia, likely of chronic disease.  No signs of active bleeding. 200cc EBL in OR Need for DVT prophy P:   Monitor CBC Transfuse for Hg<7 Lovenox for DVT prophy  INFECTIOUS A: Possible SBO with concern for perforation.  Although, no e/o this in OR Possible HCAP P:   Broad ABX and cx as above.    ENDOCRINE A: No acute issues P:     NEUROLOGIC A: Need for sedation P:   Prn fentanyl and versed for sedation Goal RASS -1  TODAY'S SUMMARY: Taken to OR for concern for pneumatosis.  Ex-lap remarkable for ascites and a few adhesions but no e/o perf.  Remained intubated postop.  HD stable but with severe metabolic acidosis prompting bicarb gtt initiation.   I have personally obtained a history, examined the patient, evaluated laboratory and imaging results, formulated the assessment and plan and placed orders.  CRITICAL CARE: The patient is critically ill with multiple organ systems failure and requires high complexity decision making for assessment and support, frequent evaluation and titration of therapies, application of advanced  monitoring technologies and extensive interpretation of multiple databases. Critical Care Time devoted to patient care services described in this note is 45 minutes.   Lucrezia Starch, MD Pulmonary and [place] Pager: [phone]   05/02/2014, 12:26 AM

## 2014-05-02 NOTE — Progress Notes (Signed)
[facility] Progress Note Patient Name: Clifford Cook DOB: August 22, 1945 MRN: [id number]   Date of Service  05/02/2014  HPI/Events of Note  Post op - intubated  eICU Interventions  Vent /sedation orders Dc lactulose for now SQ lovenox     Intervention Category Major Interventions: Respiratory failure - evaluation and management  Sophie Tamez V. 05/02/2014, 12:07 AM

## 2014-05-02 NOTE — Progress Notes (Signed)
1 Day Post-Op  Subjective: On vent looks comfortable, still tachycardic.  He is responding and seem quite oriented to current situation.  Objective: Vital signs in last 24 hours: Temp:  [91.8 F (33.2 C)-98.2 F (36.8 C)] 93.9 F (34.4 C) (04/18 0600) Pulse Rate:  [88-121] 98 (04/18 0630) Resp:  [18-28] 26 (04/18 0630) BP: (102-111)/(53-63) 105/56 mmHg (04/18 0417) SpO2:  [92 %-100 %] 100 % (04/18 0630) Arterial Line BP: (80-115)/(47-56) 109/51 mmHg (04/18 0630) FiO2 (%):  [80 %-100 %] 80 % (04/18 0417) Weight:  [88.1 kg (194 lb 3.6 oz)] 88.1 kg (194 lb 3.6 oz) (04/18 0105) Last BM Date: 04/30/14 2100 from NG 3000 paracentesis 445 urine output yesteday Afebrile, hypothermic most of yesterday Tachycardic, BP OK PH 7.225 this AM 0400 NA 130 WBC 14.2 CXR : Increased pulmonary vascular congestion and worsening right midlung and bibasilar opacities, concerning for pneumonia although a degree of mild edema is also possible. 3. Small right pleural effusion. Intake/Output from previous day: 04/17 0701 - 04/18 0700 In: 7557 [I.V.:6790; Blood:427; NG/GT:120; IV Piggyback:100] Out: 5746 [Urine:445; Emesis/NG output:2101; Blood:200] Intake/Output this shift:    General appearance: alert, cooperative, no distress and on Vent.  still tachycardic, SR. GI: some distension, incision looks fine, dressing is dry, no bowel sounds.  Lab Results:   Recent Labs  04/19/2014 0943 05/02/14 0415  WBC 10.7* 14.2*  HGB 9.7* 9.9*  HCT 29.3* 31.8*  PLT 132* 193    BMET  Recent Labs  04/18/2014 0504 05/02/14 0415  NA 129* 130*  K 4.2 3.8  CL 100 100  CO2 24 13*  GLUCOSE 117* 236*  BUN 8 13  CREATININE 0.72 0.98  CALCIUM 7.7* 7.0*   PT/INR  Recent Labs  04/20/2014 0504  LABPROT 18.3*  INR 1.51*     Recent Labs Lab 05/14/2014 1526 04/28/14 0535 04/29/14 0513 05/14/2014 0504 05/02/14 0415  AST 37 27 33 36 42*  ALT 23 19 22 23 21   ALKPHOS 160* 123* 125* 146* 128*  BILITOT  2.9* 2.6* 3.2* 3.9* 3.7*  PROT 5.4* 4.8* 5.0* 5.4* 5.4*  ALBUMIN 2.3* 1.9* 1.9* 2.0* 2.1*     Lipase     Component Value Date/Time   LIPASE 101* 04/25/2014 1222     Studies/Results: Ct Abdomen Pelvis W Wo Contrast  04/27/2014   CLINICAL DATA:  Nausea and vomiting and abdominal distention  EXAM: CT ABDOMEN AND PELVIS WITHOUT AND WITH CONTRAST  TECHNIQUE: Multidetector CT imaging of the abdomen and pelvis was performed following the standard protocol before and following the bolus administration of intravenous contrast.  CONTRAST:  128mL OMNIPAQUE IOHEXOL 300 MG/ML  SOLN  COMPARISON:  None.  FINDINGS: Lung bases demonstrate bibasilar pneumonia of right worse than left with small associated pleural effusions.  The liver is small and nodular consistent with underlying cirrhotic change. No focal mass lesion is noted. The gallbladder is well distended. The spleen, adrenal glands and pancreas are within normal limits.  The kidneys are well visualized bilaterally. A nonobstructing left renal calculus is noted. Parapelvic cyst is noted in the upper portion of the left kidney. Normal enhancement is noted otherwise. The bladder is partially distended with opacified and non-opacified urine. No definitive bladder mass is seen.  Diffuse ascites is noted consistent with the known portal hypertension. 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 There is a spontaneous decompression of this dilated mesenteric vein into the inferior vena cava. Within  these varices as well as within branches of the superior mesenteric vein there is evidence of air although no portal venous air is noted within the liver. These changes suggest bowel ischemia with changes of pneumatosis noted within multiple dilated small bowel loops. A transition zone is noted in the distal ileum just before the terminal ileum. No definitive mass lesion is  seen. The etiology of this transition zone is uncertain.  Diverticular change of the sigmoid colon is seen without evidence of diverticulitis. Postsurgical changes are noted in the left hip. Diffuse degenerative changes of the lumbar spine are seen. Compression deformities are again identified within the lumbar spine of a chronic nature.  IMPRESSION: Changes of small-bowel obstruction with evidence of pneumatosis within the wall of the small bowel and air within the branches of the superior mesenteric vein as well as a large decompressive varix within the mid abdomen arising from the inferior mesenteric vein. Surgical consultation is recommended.  Cirrhotic change of the liver with associated changes of portal hypertension to include the previously described varices as well as ascites.  There are no findings to account for the patient's history of hematuria.  Critical Value/emergent results were called by telephone at the time of interpretation on 04/23/2014 at 7:11 pm to Dr. Johney Maine, who verbally acknowledged these results.   Electronically Signed   By: Inez Catalina M.D.   On: 05/13/2014 19:27   Dg Chest 1 View  04/28/2014   CLINICAL DATA:  Cough this morning; Ileus. Dyspnea.  EXAM: CHEST  1 VIEW  COMPARISON:  04/07/2014  FINDINGS: Cardiac silhouette is borderline enlarged. No mediastinal or hilar masses. No convincing adenopathy.  There is hazy airspace opacity projecting in the right mid lung, new from prior exam. Small area of pneumonia is suspected. There is mild linear opacity at the left lung base consistent with scarring and/or atelectasis. This is stable.  Remainder of the lungs is clear. No pleural effusion or pneumothorax.  Bony thorax is demineralized but grossly intact.  IMPRESSION: Small area of right mid lung, likely right upper lobe, hazy airspace opacity consistent with pneumonia given the history of cough.   Electronically Signed   By: Lajean Manes M.D.   On: 04/26/2014 09:34   Dg Chest Port 1  View  05/02/2014   CLINICAL DATA:  Central line placement.  EXAM: PORTABLE CHEST - 1 VIEW  COMPARISON:  04/15/2014  FINDINGS: The patient is mildly rotated to the right, partially distorting the mediastinum. The endotracheal tube has been placed and terminates approximately 2 cm above the carina. Right neck catheter courses into the upper mediastinum and is directed leftward, possibly in the central aspect of the left brachiocephalic vein. Enteric tube terminates in the midline of the upper abdomen likely in the gastric body.  Cardiomediastinal silhouette is grossly unchanged. Lung volumes are mildly diminished with increased pulmonary vascular congestion. Patchy right mid lung opacities have increased, now with an area of confluent consolidation and/or pleural fluid along the minor fissure. Veiling opacity in the right lung base with obscuration of the costophrenic angle is consistent with a small pleural effusion. Patchy bilateral lower lobe opacities are also present. No pneumothorax is identified. No acute osseous abnormality is seen. Lucency partially visualized under the hemidiaphragm of the right upper quadrant is compatible with intraperitoneal free air related to interval abdominal surgery.  IMPRESSION: 1. Support apparatus as above. Right jugular catheter courses leftward in the superior mediastinum, possibly partially into the left brachiocephalic vein. Recommend correlation with blood  return to ensure intravascular, venous positioning and exclude arterial or extravascular placement. 2. Increased pulmonary vascular congestion and worsening right midlung and bibasilar opacities, concerning for pneumonia although a degree of mild edema is also possible. 3. Small right pleural effusion.   Electronically Signed   By: Logan Bores   On: 05/02/2014 01:07   Dg Abd 2 Views  05/09/2014   CLINICAL DATA:  Abdominal distention, nausea and vomiting today.  EXAM: ABDOMEN - 2 VIEW  COMPARISON:  Previous imaging  examinations.  FINDINGS: Multiple dilated small bowel loops containing multiple air-fluid levels. Gas and stool in mildly prominent right colon and hepatic flexure. No more distal colon gas seen other than some gas in the rectum. No free peritoneal air. Left hip fixation hardware. Lumbar and lower thoracic spine degenerative changes.  IMPRESSION: Partial small bowel obstruction. Colonic obstruction at the level of the transverse colon is less likely.   Electronically Signed   By: Claudie Revering M.D.   On: 05/08/2014 09:27    Medications: . bisacodyl  10 mg Rectal Daily  . enoxaparin (LOVENOX) injection  30 mg Subcutaneous Q24H  . imipenem-cilastatin  500 mg Intravenous 4 times per day  . lip balm  1 application Topical BID  . pantoprazole (PROTONIX) IV  40 mg Intravenous QHS  . sodium chloride  1,000 mL Intravenous Once  . vancomycin  1,000 mg Intravenous Q12H    Assessment/Plan SBO with probable, pneumatosis. EXPLORATORY LAPAROTOMY, LAPAROSCOPIC LYSIS OF ADHESIONS, PARACENTESIS x 3L, 04/17/2014,Dr. 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  Will defer IV's to CCM.  Await bowel function.       LOS: 5 days    Analyce Tavares 05/02/2014

## 2014-05-02 NOTE — Progress Notes (Signed)
PULMONARY  / CRITICAL CARE MEDICINE CONSULTATION   Name: Clifford Cook MRN: [id number] DOB: 1945/03/23    ADMISSION DATE:  04/19/2014 CONSULTATION DATE: 05/02/14  REQUESTING CLINICIAN: Dr. Johney Maine PRIMARY SERVICE: Surgery  CHIEF COMPLAINT:  Small bowel obstruction  BRIEF PATIENT DESCRIPTION:   21yom with PMH of NASH cirrhosis, CKD III, COPD presented on 04/25/2014 with worsening Abd distention and elevated ammonia.  He developed pneumatosis on Abd imaging prompting an emergent ex-lap on 4/17.  We were consulted for ICU comanagement.  SIGNIFICANT EVENTS / STUDIES:  04/23/2014:  Admitted with worsening ascites and abd distention 04/29/14:  MRI L spine with multiple subacute compression fx's 04/20/2014:  Developed ileus vs SBO with vomiting.  CT Abd/ pelvis concerning for pneumatosis.  Taken to OR for exlap.  Transferred to ICU  LINES / TUBES: RIJ TLC:  05/06/2014--> Foley:  04/28/2014-->    HISTORY OF PRESENT ILLNESS:  24yom with PMH of NASH cirrhosis, CKD III, COPD presented on 05/06/2014 with worsening Abd distention, back pain, and elevated ammonia.  While hospitalized here he developed an ileus vs SBO with vomiting prompting further eval.  His CT Abd as above was concerning for pneumatosis prompting an emergent exlap.  Significant ascites as well as a few adhesions were found intraop but no e/o pneumatosis.  The pt was HD stable and remained intubated and sedated post op.  He did have a significant metabolic acidosis as well prompting initiation of a bicarb gtt.     SUBJECTIVE: Unable to obtain as pt intubated/ sedated  VITAL SIGNS: Temp:  [91.8 F (33.2 C)-98.2 F (36.8 C)] 95.7 F (35.4 C) (04/18 0730) Pulse Rate:  [88-121] 98 (04/18 0630) Resp:  [18-28] 26 (04/18 0630) BP: (102-111)/(53-63) 105/56 mmHg (04/18 0417) SpO2:  [92 %-100 %] 100 % (04/18 0630) Arterial Line BP: (80-115)/(47-56) 109/51 mmHg (04/18 0630) FiO2 (%):  [70 %-100 %] 70 % (04/18 0800) Weight:  [88.1 kg (194 lb 3.6 oz)] 88.1  kg (194 lb 3.6 oz) (04/18 0105) HEMODYNAMICS: CVP:  [8 mmHg-15 mmHg] 11 mmHg VENTILATOR SETTINGS: Vent Mode:  [-] PRVC FiO2 (%):  [70 %-100 %] 70 % Set Rate:  [15 bmp-25 bmp] 25 bmp Vt Set:  [550 mL] 550 mL PEEP:  [5 cmH20] 5 cmH20 Plateau Pressure:  [15 cmH20-19 cmH20] 15 cmH20 INTAKE / OUTPUT: Intake/Output      04/17 0701 - 04/18 0700 04/18 0701 - 04/19 0700   P.O. 0    I.V. (mL/kg) 6790 (77.1)    Blood 427    Other 120    NG/GT 120    IV Piggyback 100    Total Intake(mL/kg) 7557 (85.8)    Urine (mL/kg/hr) 445 (0.2)    Emesis/NG output 2101 (1)    Other 3000 (1.4)    Blood 200 (0.1)    Total Output 5746     Net +1811            PHYSICAL EXAMINATION: General: Intubated, denies pain  Neuro:  Awake, f/c, approp HEENT:  PERRL, orally intubated  Cardiovascular:  Tachy, reg Lungs: decreased bases   Abdomen:  Distended, midline bandage in place, no bowel sounds Musculoskeletal:  + LE edema  Skin:  Warm, spider angiomas noted on chest  LABS:  CBC  Recent Labs Lab 04/29/14 0513 04/28/2014 0943 05/02/14 0415  WBC 5.8 10.7* 14.2*  HGB 8.5* 9.7* 9.9*  HCT 25.6* 29.3* 31.8*  PLT 95* 132* 193   Coag's  Recent Labs Lab 04/29/14 0513 04/18/2014 0504  INR 1.50* 1.51*   BMET  Recent Labs Lab 04/29/14 0513 05/05/2014 0504 05/02/14 0415  NA 131* 129* 130*  K 3.8 4.2 3.8  CL 103 100 100  CO2 23 24 13*  BUN 9 8 13   CREATININE 0.74 0.72 0.98  GLUCOSE 99 117* 236*   Electrolytes  Recent Labs Lab 04/29/14 0513 04/24/2014 0504 05/02/14 0415  CALCIUM 7.4* 7.7* 7.0*  MG 1.9  --   --    Sepsis Markers  Recent Labs Lab 04/29/14 0513 05/09/2014 0943 05/13/2014 1211  LATICACIDVEN 1.5 2.7* 3.5*   ABG  Recent Labs Lab 04/30/2014 2150 05/02/14 0050 05/02/14 0423  PHART 7.178* 7.082* 7.225*  PCO2ART 34.5* 47.9* 30.2*  PO2ART 270.0* 158.0* 87.3   Liver Enzymes  Recent Labs Lab 04/29/14 0513 04/26/2014 0504 05/02/14 0415  AST 33 36 42*  ALT 22 23 21    ALKPHOS 125* 146* 128*  BILITOT 3.2* 3.9* 3.7*  ALBUMIN 1.9* 2.0* 2.1*   Cardiac Enzymes No results for input(s): TROPONINI, PROBNP in the last 168 hours. Glucose No results for input(s): GLUCAP in the last 168 hours.  Imaging Ct Abdomen Pelvis W Wo Contrast  04/30/2014   CLINICAL DATA:  Nausea and vomiting and abdominal distention  EXAM: CT ABDOMEN AND PELVIS WITHOUT AND WITH CONTRAST  TECHNIQUE: Multidetector CT imaging of the abdomen and pelvis was performed following the standard protocol before and following the bolus administration of intravenous contrast.  CONTRAST:  113mL OMNIPAQUE IOHEXOL 300 MG/ML  SOLN  COMPARISON:  None.  FINDINGS: Lung bases demonstrate bibasilar pneumonia of right worse than left with small associated pleural effusions.  The liver is small and nodular consistent with underlying cirrhotic change. No focal mass lesion is noted. The gallbladder is well distended. The spleen, adrenal glands and pancreas are within normal limits.  The kidneys are well visualized bilaterally. A nonobstructing left renal calculus is noted. Parapelvic cyst is noted in the upper portion of the left kidney. Normal enhancement is noted otherwise. The bladder is partially distended with opacified and non-opacified urine. No definitive bladder mass is seen.  Diffuse ascites is noted consistent with the known portal hypertension. 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 There is a spontaneous decompression of this dilated mesenteric vein into the inferior vena cava. Within these varices as well as within branches of the superior mesenteric vein there is evidence of air although no portal venous air is noted within the liver. These changes suggest bowel ischemia with changes of pneumatosis noted within multiple dilated small bowel loops. A transition zone is noted in the distal ileum just before the  terminal ileum. No definitive mass lesion is seen. The etiology of this transition zone is uncertain.  Diverticular change of the sigmoid colon is seen without evidence of diverticulitis. Postsurgical changes are noted in the left hip. Diffuse degenerative changes of the lumbar spine are seen. Compression deformities are again identified within the lumbar spine of a chronic nature.  IMPRESSION: Changes of small-bowel obstruction with evidence of pneumatosis within the wall of the small bowel and air within the branches of the superior mesenteric vein as well as a large decompressive varix within the mid abdomen arising from the inferior mesenteric vein. Surgical consultation is recommended.  Cirrhotic change of the liver with associated changes of portal hypertension to include the previously described varices as well as ascites.  There are no findings to account for the patient's history of  hematuria.  Critical Value/emergent results were called by telephone at the time of interpretation on 04/21/2014 at 7:11 pm to Dr. Johney Maine, who verbally acknowledged these results.   Electronically Signed   By: Inez Catalina M.D.   On: 04/15/2014 19:27   Dg Chest 1 View  04/27/2014   CLINICAL DATA:  Cough this morning; Ileus. Dyspnea.  EXAM: CHEST  1 VIEW  COMPARISON:  04/07/2014  FINDINGS: Cardiac silhouette is borderline enlarged. No mediastinal or hilar masses. No convincing adenopathy.  There is hazy airspace opacity projecting in the right mid lung, new from prior exam. Small area of pneumonia is suspected. There is mild linear opacity at the left lung base consistent with scarring and/or atelectasis. This is stable.  Remainder of the lungs is clear. No pleural effusion or pneumothorax.  Bony thorax is demineralized but grossly intact.  IMPRESSION: Small area of right mid lung, likely right upper lobe, hazy airspace opacity consistent with pneumonia given the history of cough.   Electronically Signed   By: Lajean Manes M.D.    On: 05/02/2014 09:34   Dg Chest Port 1 View  05/02/2014   CLINICAL DATA:  Central line placement.  EXAM: PORTABLE CHEST - 1 VIEW  COMPARISON:  04/30/2014  FINDINGS: The patient is mildly rotated to the right, partially distorting the mediastinum. The endotracheal tube has been placed and terminates approximately 2 cm above the carina. Right neck catheter courses into the upper mediastinum and is directed leftward, possibly in the central aspect of the left brachiocephalic vein. Enteric tube terminates in the midline of the upper abdomen likely in the gastric body.  Cardiomediastinal silhouette is grossly unchanged. Lung volumes are mildly diminished with increased pulmonary vascular congestion. Patchy right mid lung opacities have increased, now with an area of confluent consolidation and/or pleural fluid along the minor fissure. Veiling opacity in the right lung base with obscuration of the costophrenic angle is consistent with a small pleural effusion. Patchy bilateral lower lobe opacities are also present. No pneumothorax is identified. No acute osseous abnormality is seen. Lucency partially visualized under the hemidiaphragm of the right upper quadrant is compatible with intraperitoneal free air related to interval abdominal surgery.  IMPRESSION: 1. Support apparatus as above. Right jugular catheter courses leftward in the superior mediastinum, possibly partially into the left brachiocephalic vein. Recommend correlation with blood return to ensure intravascular, venous positioning and exclude arterial or extravascular placement. 2. Increased pulmonary vascular congestion and worsening right midlung and bibasilar opacities, concerning for pneumonia although a degree of mild edema is also possible. 3. Small right pleural effusion.   Electronically Signed   By: Logan Bores   On: 05/02/2014 01:07   Dg Abd 2 Views  05/08/2014   CLINICAL DATA:  Abdominal distention, nausea and vomiting today.  EXAM: ABDOMEN -  2 VIEW  COMPARISON:  Previous imaging examinations.  FINDINGS: Multiple dilated small bowel loops containing multiple air-fluid levels. Gas and stool in mildly prominent right colon and hepatic flexure. No more distal colon gas seen other than some gas in the rectum. No free peritoneal air. Left hip fixation hardware. Lumbar and lower thoracic spine degenerative changes.  IMPRESSION: Partial small bowel obstruction. Colonic obstruction at the level of the transverse colon is less likely.   Electronically Signed   By: Claudie Revering M.D.   On: 05/08/2014 09:27     CXR: R midlung and basilar opacities, specifically right mid-lung. increased vascular congestion.    ASSESSMENT / PLAN:  PULMONARY A:  Intubated for OR procedure now with severe metabolic acidosis  H/o COPD ??PNA P:   Cont MV with PRVC at 8cc/kg Daily SBT; after acid base improved Wean Fi02 for sats >88% Prn duonebs VAP bundle See ID section   CARDIOVASCULAR A: Tachycardia Intraop hypotension, now resolved s/p 6L IVF in OR  P:   Keep euvolemic  NE to keep MAPS >65 prn ABX as above Monitoring CVP  RENAL A: Severe metabolic acidosis (both +AG and NG component) suspect 2/2 increased lactate in setting of liver disease and possible SBO Chronic hyponatremia likely 2/2 liver dz  P:   Maximize Ve Started bicarb gtt; trend serial chemistries  Monitor UOP closely Monitor BMPs daily   GASTROINTESTINAL A: Possible SBO with pneumatosis but no e/o pneumatosis on ex-lap.  Now s/p lysis of adhesions.  No intrabdominal soilage noted.   P:   GI surgery following closely Cont broad Abx with vanc/ primaxin Blood cx pending  HEMATOLOGIC A: Anemia, likely of chronic disease.  No signs of active bleeding. 200cc EBL in OR Need for DVT prophy P:   Monitor CBC Transfuse for Hg<7 Lovenox for DVT prophy  INFECTIOUS A: Possible SBO with concern for perforation.  Although, no e/o this in OR Possible HCAP P:    CULTURES: Blood cx x 2 from 4/17:>>> Paracentesis fluid cx from 4/15>>>  ANTIBIOTICS: Primaxin:  04/17/2014>>> Vanc: 04/22/2014>>>  ENDOCRINE A: No acute issues P:     NEUROLOGIC A: Need for sedation P:   Prn fentanyl and versed for sedation Goal RASS -1  TODAY'S SUMMARY: Taken to OR for concern for pneumatosis.  Ex-lap remarkable for ascites and a few adhesions but no e/o perf.  Remained intubated postop.  HD stable but with severe metabolic acidosis prompting bicarb gtt initiation. Would like to see his acid base corrected, then we can look at weaning.    Erick Colace ACNP-BC [place] Pager # [phone] OR # [phone] if no answer   05/02/2014, 8:31 AM   Attending:  I have seen and examined the patient with nurse practitioner/resident and agree with the note above.   Mr Leonhardt had surgery last night and fortunately it sounds as if he had viable bowel and no sign of peritonitis.  He looks well this morning, but still has some metabolic derangements keeping him on the ventilator (metabolic acidosis).    On my exam: lungs with a few crackles in the right base, trace edema in extremities, abdominal surgical scars well dressed  CXR personally reviewed> there appears to be worsening airspace disease in the right lung  Metabolic acidosis> taking longer to clear with cirrhosis which is expected, change bicarb gtt to LR Respiratory failure> improving, hopefully extubate in AM, still needs vent support Septic shock> mostly due to cirrhosis, baseline BP 100/50, wean off levophed  Wife updated by me on phone 4/18 AM  My cc time 40 minutes  Roselie Awkward, MD [facility] Pager: [phone] Cell: [phone] If no response, call [phone]

## 2014-05-02 NOTE — Progress Notes (Signed)
Patient had urgent surgery last night.  Patient remains intubated.  Will follow at a distance for now.

## 2014-05-02 NOTE — Progress Notes (Signed)
IR PA aware of patient's surgery on 4/17 and now intubated. Will hold off of any elective VP/KP until extubated and stable.   Tsosie Billing PA-C Interventional Radiology  05/02/14  8:33 AM

## 2014-05-02 NOTE — Progress Notes (Signed)
[facility] Progress Note Patient Name: YUAN GANN DOB: 28-Apr-1945 MRN: [id number]   Date of Service  05/02/2014  HPI/Events of Note    eICU Interventions  Bicarb gtt, increase RR 25     Intervention Category Major Interventions: Acid-Base disturbance - evaluation and management  Kenan Moodie V. 05/02/2014, 1:04 AM

## 2014-05-02 NOTE — Progress Notes (Signed)
Low urinary output noted, foley check and flushed. Per Marni Griffon, NP increase LR to 112ml/hr

## 2014-05-02 NOTE — Progress Notes (Signed)
Events overnight reviewed. Patient's medical record reviewed. Patient now is intubated in the ICU Critical care medicine was consulted and has seen the patient -Discussed case with critical care medicine -They have kindly accepted care for the patient -[facility] will resume care once pt is stable and transferred out of ICU  DTat

## 2014-05-02 NOTE — Progress Notes (Signed)
INITIAL NUTRITION ASSESSMENT  DOCUMENTATION CODES Per approved criteria  -Not Applicable   INTERVENTION: Recommend diet advancement versus TF initiation within 48 hours (Vital High Protein @ 70 mL/hr which will provides 1680 kcal, 147 grams, and 1404 mL free water).  RD to monitor POC and make suggestions as pt progresses  NUTRITION DIAGNOSIS: Inadequate protein-energy intake related to inability to consume nutrition as evidenced by NPO s/p emergent surgery with no nutrition support.   Goal: Initiation of PO diet versus nutrition support within 48 hours  Pt to meet >/= 90% estimated needs  Monitor:  POC, diet advancement versus initiation of nutrition support, weight trends, labs, I/O's  Reason for Assessment: consult, new vent  69 y.o. male  Admitting Dx: SBO (small bowel obstruction)  ASSESSMENT: This is a 69 year old male with extensive hx of cirrhosis with ascites. Pt takes Lactulose at home to assist. He had paracentesis on 4/15 with 3.1 L removed. Pt had emergent surgery on 4/17 and was then transferred to ICU and remained intubated. He has chronic L3 and L4 compression fractures.  Pt seen for consult for "family wants evaluation of nutritional needs to maximize caloric intake." Pt was previously on Heart Healthy diet eating 100%; Resource Breeze TID was also ordered.   Pt was thought to have ileus versus SBO after abdominal x-rays. NGT was placed to R nare for decompression 4/17 with 650 mL drainage received at 6:50 PM yesterday. Pt was then taken for emergent surgery last night. Findings inconsistent with SBO per surgery note. Per rounds this AM, pt with twisted bowel which was corrected during ex-lap last night.   Pt is currently intubated with mVe: 14.4. Needs based on [employer] Equation (1705 kcal). He is not sedated and NGT remains to R nare. Per I/O documentation, 400 mL drainage at 4 AM today. During visit, 600 mL dark brown drainage present which RN reports is all  from 7 AM to present time.   Pt unable to meet needs at this time. Family not present for questions. Physical assessment completed and showed mild/moderate temporal wasting with all other areas WDL.  Labs reviewed; Na: 130 mmol/L.  If diet unable to be advanced if pt to remain intubated, and if NGT output decreases with in 48 hours, recommend Vital High Protein @ 70 mL/hr which will provides 1680 kcal, 147 grams, and 1404 mL free water.  Height: Ht Readings from Last 1 Encounters:  04/29/14 5\' 8"  (1.727 m)    Weight: Wt Readings from Last 1 Encounters:  05/02/14 194 lb 3.6 oz (88.1 kg)    Ideal Body Weight: 154 lbs (70 kg)  % Ideal Body Weight: 126%  Wt Readings from Last 10 Encounters:  05/02/14 194 lb 3.6 oz (88.1 kg)  03/21/14 192 lb 3.9 oz (87.2 kg)  12/14/13 212 lb 1.3 oz (96.2 kg)  11/25/13 230 lb (104.327 kg)  07/13/13 198 lb (89.812 kg)  04/17/12 239 lb 8 oz (108.636 kg)    Usual Body Weight: Unable to assess at this time  % Usual Body Weight: Unable to calculate at this time  BMI:  Body mass index is 29.54 kg/(m^2).  Estimated Nutritional Needs: Kcal: 1600-1800 Protein: 130-150 kcal Fluid: 1.5L/day  Skin: Abdominal and lip incisions, ecchymosis to bilateral arms  Diet Order: Diet NPO time specified  EDUCATION NEEDS: -Education not appropriate at this time   Intake/Output Summary (Last 24 hours) at 05/02/14 1153 Last data filed at 05/02/14 1138  Gross per 24 hour  Intake  8659.89 ml  Output   5040 ml  Net 3619.89 ml    Last BM: PTA   Labs:   Recent Labs Lab 04/29/14 0513 05/12/2014 0504 05/02/14 0415  NA 131* 129* 130*  K 3.8 4.2 3.8  CL 103 100 100  CO2 23 24 13*  BUN 9 8 13   CREATININE 0.74 0.72 0.98  CALCIUM 7.4* 7.7* 7.0*  MG 1.9  --   --   GLUCOSE 99 117* 236*    CBG (last 3)  No results for input(s): GLUCAP in the last 72 hours.  Scheduled Meds: . antiseptic oral rinse  7 mL Mouth Rinse QID  . bisacodyl  10 mg Rectal Daily   . chlorhexidine  15 mL Mouth Rinse BID  . enoxaparin (LOVENOX) injection  30 mg Subcutaneous Q24H  . imipenem-cilastatin  500 mg Intravenous 4 times per day  . lip balm  1 application Topical BID  . pantoprazole (PROTONIX) IV  40 mg Intravenous QHS  . sodium chloride  1,000 mL Intravenous Once  . vancomycin  1,000 mg Intravenous Q12H    Continuous Infusions: . lactated ringers 75 mL/hr at 05/02/14 1130  . norepinephrine (LEVOPHED) Adult infusion 10 mcg/min (05/02/14 1102)    Past Medical History  Diagnosis Date  . Stomach ulcer   . GI bleed   . Liver disorder   . Cirrhosis   . Complication of anesthesia     slow to wake up with colonoscopy - 2013   . Dysrhythmia     afib, aflutter - during hospitalization and resolved no cardiac followup   . Pneumonia     septic pneumonia - 10/2013   . Chronic kidney disease     hx of stage III CKD   . Thrombocytopenia   . Pneumonia due to streptococcus, group A 10/31/2013  . Hyponatremia 10/31/2013  . Respiratory failure with hypoxia 10/31/2013  . Rib fracture 10/31/2013  . Hip fracture 12/05/2013  . Atrial flutter 12/08/2013  . Encephalopathy, hepatic   . Abscess of hand     Past Surgical History  Procedure Laterality Date  . Cataract extraction, bilateral    . Surgery for lazy eye      age 43  . Esophagogastroduodenoscopy N/A 04/16/2012    Procedure: ESOPHAGOGASTRODUODENOSCOPY (EGD);  Surgeon: Jeryl Columbia, MD;  Location: [facility];  Service: Endoscopy;  Laterality: N/A;  . Esophagogastroduodenoscopy N/A 04/17/2012    Procedure: ESOPHAGOGASTRODUODENOSCOPY (EGD);  Surgeon: Jeryl Columbia, MD;  Location: [facility];  Service: Endoscopy;  Laterality: N/A;  . Direct laryngoscopy N/A 07/13/2013    Procedure: DIRECT LARYNGOSCOPY WITH EXCISION TUMOR;  Surgeon: Rozetta Nunnery, MD;  Location: [facility];  Service: ENT;  Laterality: N/A;  . Hip pinning,cannulated Left 12/06/2013    Procedure: CANNULATED HIP PINNING;   Surgeon: Renette Butters, MD;  Location: [facility] [place];  Service: Orthopedics;  Laterality: Left;  . Tee without cardioversion N/A 12/13/2013    Procedure: TRANSESOPHAGEAL ECHOCARDIOGRAM (TEE);  Surgeon: Larey Dresser, MD;  Location: [facility];  Service: Cardiovascular;  Laterality: N/A;  . Multiple extractions with alveoloplasty N/A 04/14/2014    Procedure: Extraction of tooth #'s 3,4,5,6,7,8,9,10,11,15,21,22,23,24,25,26,27,28 WITH ALVEOLOPLASTY;  Surgeon: Lenn Cal, DDS;  Location: [facility];  Service: Oral Surgery;  Laterality: N/A;      Jarome Matin, RD, LDN Inpatient Clinical Dietitian Pager # [phone] After hours/weekend pager # [phone]

## 2014-05-02 NOTE — Transfer of Care (Signed)
Immediate Anesthesia Transfer of Care Note  Patient: Clifford Cook  Procedure(s) Performed: Procedure(s): EXPLORATORY LAPAROTOMY (N/A) LAPAROSCOPIC LYSIS OF ADHESIONS PARACENTESIS  Patient Location: ICU  Anesthesia Type:General  Level of Consciousness: Patient remains intubated per anesthesia plan  Airway & Oxygen Therapy: Patient remains intubated per anesthesia plan and Patient placed on Ventilator (see vital sign flow sheet for setting)  Post-op Assessment: Report given to RN and Post -op Vital signs reviewed and stable  Post vital signs: stable  Last Vitals:  Filed Vitals:   04/23/2014 1954  BP: 111/63  Pulse: 92  Temp: 36.3 C  Resp: 22    Complications: No apparent anesthesia complications  Planned post op ventilation and ICU

## 2014-05-03 DIAGNOSIS — A419 Sepsis, unspecified organism: Principal | ICD-10-CM

## 2014-05-03 DIAGNOSIS — J189 Pneumonia, unspecified organism: Secondary | ICD-10-CM

## 2014-05-03 DIAGNOSIS — K5669 Other intestinal obstruction: Secondary | ICD-10-CM

## 2014-05-03 DIAGNOSIS — K7290 Hepatic failure, unspecified without coma: Secondary | ICD-10-CM

## 2014-05-03 DIAGNOSIS — T148 Other injury of unspecified body region: Secondary | ICD-10-CM

## 2014-05-03 DIAGNOSIS — R6521 Severe sepsis with septic shock: Secondary | ICD-10-CM

## 2014-05-03 LAB — BASIC METABOLIC PANEL
Anion gap: 6 (ref 5–15)
Anion gap: 5 (ref 5–15)
BUN: 26 mg/dL — ABNORMAL HIGH (ref 6–23)
BUN: 29 mg/dL — ABNORMAL HIGH (ref 6–23)
CO2: 24 mmol/L (ref 19–32)
CO2: 25 mmol/L (ref 19–32)
CREATININE: 1.69 mg/dL — AB (ref 0.50–1.35)
Calcium: 7 mg/dL — ABNORMAL LOW (ref 8.4–10.5)
Calcium: 7.2 mg/dL — ABNORMAL LOW (ref 8.4–10.5)
Chloride: 103 mmol/L (ref 96–112)
Chloride: 101 mmol/L (ref 96–112)
Creatinine, Ser: 1.61 mg/dL — ABNORMAL HIGH (ref 0.50–1.35)
GFR calc Af Amer: 49 mL/min — ABNORMAL LOW (ref >90)
GFR calc non Af Amer: 42 mL/min — ABNORMAL LOW (ref >90)
GFR calc non Af Amer: 40 mL/min — ABNORMAL LOW (ref >90)
GFR, EST AFRICAN AMERICAN: 46 mL/min — AB (ref >90)
GLUCOSE: 143 mg/dL — AB (ref 70–99)
Glucose, Bld: 160 mg/dL — ABNORMAL HIGH (ref 70–99)
POTASSIUM: 4.1 mmol/L (ref 3.5–5.1)
Potassium: 4.4 mmol/L (ref 3.5–5.1)
Sodium: 133 mmol/L — ABNORMAL LOW (ref 135–145)
Sodium: 131 mmol/L — ABNORMAL LOW (ref 135–145)

## 2014-05-03 LAB — BLOOD GAS, ARTERIAL
Acid-Base Excess: 1.3 mmol/L (ref 0.0–2.0)
Bicarbonate: 24.8 mEq/L — ABNORMAL HIGH (ref 20.0–24.0)
Drawn by: 31814
FIO2: 0.5 %
LHR: 25 {breaths}/min
O2 Saturation: 97.5 %
PATIENT TEMPERATURE: 98.9
PEEP/CPAP: 5 cmH2O
TCO2: 23.3 mmol/L (ref 0–100)
VT: 550 mL
pCO2 arterial: 36.6 mmHg (ref 35.0–45.0)
pH, Arterial: 7.445 (ref 7.350–7.450)
pO2, Arterial: 90.9 mmHg (ref 80.0–100.0)

## 2014-05-03 LAB — VANCOMYCIN, TROUGH: Vancomycin Tr: 35.9 ug/mL (ref 10.0–20.0)

## 2014-05-03 LAB — URINE CULTURE
Colony Count: NO GROWTH
Culture: NO GROWTH

## 2014-05-03 LAB — CBC
HEMATOCRIT: 25.1 % — AB (ref 39.0–52.0)
Hemoglobin: 8.4 g/dL — ABNORMAL LOW (ref 13.0–17.0)
MCH: 31.8 pg (ref 26.0–34.0)
MCHC: 33.5 g/dL (ref 30.0–36.0)
MCV: 95.1 fL (ref 78.0–100.0)
Platelets: 152 10*3/uL (ref 150–400)
RBC: 2.64 MIL/uL — ABNORMAL LOW (ref 4.22–5.81)
RDW: 18.9 % — AB (ref 11.5–15.5)
WBC: 14.9 10*3/uL — ABNORMAL HIGH (ref 4.0–10.5)

## 2014-05-03 LAB — MRSA PCR SCREENING: MRSA by PCR: NEGATIVE

## 2014-05-03 MED ORDER — FENTANYL CITRATE (PF) 100 MCG/2ML IJ SOLN
50.0000 ug | INTRAMUSCULAR | Status: DC | PRN
  Administered 2014-05-03 – 2014-05-05 (×18): 50 ug via INTRAVENOUS
  Filled 2014-05-03 (×18): qty 2

## 2014-05-03 MED ORDER — SODIUM CHLORIDE 0.9 % IV SOLN
500.0000 mg | Freq: Three times a day (TID) | INTRAVENOUS | Status: DC
  Administered 2014-05-03 – 2014-05-09 (×20): 500 mg via INTRAVENOUS
  Filled 2014-05-03 (×21): qty 500

## 2014-05-03 MED ORDER — ENOXAPARIN SODIUM 40 MG/0.4ML ~~LOC~~ SOLN
40.0000 mg | SUBCUTANEOUS | Status: DC
  Administered 2014-05-04 – 2014-05-09 (×6): 40 mg via SUBCUTANEOUS
  Filled 2014-05-03 (×6): qty 0.4

## 2014-05-03 MED ORDER — ALBUMIN HUMAN 25 % IV SOLN
50.0000 g | Freq: Once | INTRAVENOUS | Status: AC
  Administered 2014-05-03: 50 g via INTRAVENOUS
  Filled 2014-05-03: qty 50

## 2014-05-03 NOTE — Progress Notes (Signed)
Patient ID: Clifford Cook, male   DOB: [birth year], 69 y.o.   MRN: [id number] 2 Days Post-Op  Subjective: Pt awake on vent.  Writing questions to me.  Denies much abdominal pain.  No flatus.  Objective: Vital signs in last 24 hours: Temp:  [98.3 F (36.8 C)-98.9 F (37.2 C)] 98.3 F (36.8 C) (04/19 0400) Pulse Rate:  [90-111] 95 (04/19 0720) Resp:  [15-31] 29 (04/19 0720) BP: (98-129)/(42-56) 111/55 mmHg (04/19 0720) SpO2:  [93 %-100 %] 99 % (04/19 0720) Arterial Line BP: (91-133)/(44-58) 124/57 mmHg (04/19 0600) FiO2 (%):  [40 %-70 %] 40 % (04/19 0720) Last BM Date: 04/30/14  Intake/Output from previous day: 04/18 0701 - 04/19 0700 In: 4137.6 [I.V.:3607.6; NG/GT:30; IV Piggyback:500] Out: 1445 [Urine:395; Emesis/NG output:1050] Intake/Output this shift:    PE: Abd: soft, distended secondary to reaccumlated ascites, some BS, tender to palpation appropriately.  Midline wound is c/d/i with staples.  No evidence of ascitic leak.  NGT with no output.  This was flushed and still no output received. Heart: regular  Lab Results:   Recent Labs  05/02/14 0415 05/03/14 0420  WBC 14.2* 14.9*  HGB 9.9* 8.4*  HCT 31.8* 25.1*  PLT 193 152   BMET  Recent Labs  05/02/14 2132 05/03/14 0420  NA 129* 133*  K 4.1 4.4  CL 101 103  CO2 23 24  GLUCOSE 151* 160*  BUN 22 26*  CREATININE 1.51* 1.61*  CALCIUM 6.9* 7.0*   PT/INR  Recent Labs  04/18/2014 0504  LABPROT 18.3*  INR 1.51*   CMP     Component Value Date/Time   NA 133* 05/03/2014 0420   K 4.4 05/03/2014 0420   CL 103 05/03/2014 0420   CO2 24 05/03/2014 0420   GLUCOSE 160* 05/03/2014 0420   BUN 26* 05/03/2014 0420   CREATININE 1.61* 05/03/2014 0420   CALCIUM 7.0* 05/03/2014 0420   PROT 5.4* 05/02/2014 0415   ALBUMIN 2.1* 05/02/2014 0415   AST 42* 05/02/2014 0415   ALT 21 05/02/2014 0415   ALKPHOS 128* 05/02/2014 0415   BILITOT 3.7* 05/02/2014 0415   GFRNONAA 42* 05/03/2014 0420   GFRAA 49* 05/03/2014 0420    Lipase     Component Value Date/Time   LIPASE 101* 04/25/2014 1222       Studies/Results: Ct Abdomen Pelvis W Wo Contrast  05/07/2014   CLINICAL DATA:  Nausea and vomiting and abdominal distention  EXAM: CT ABDOMEN AND PELVIS WITHOUT AND WITH CONTRAST  TECHNIQUE: Multidetector CT imaging of the abdomen and pelvis was performed following the standard protocol before and following the bolus administration of intravenous contrast.  CONTRAST:  142mL OMNIPAQUE IOHEXOL 300 MG/ML  SOLN  COMPARISON:  None.  FINDINGS: Lung bases demonstrate bibasilar pneumonia of right worse than left with small associated pleural effusions.  The liver is small and nodular consistent with underlying cirrhotic change. No focal mass lesion is noted. The gallbladder is well distended. The spleen, adrenal glands and pancreas are within normal limits.  The kidneys are well visualized bilaterally. A nonobstructing left renal calculus is noted. Parapelvic cyst is noted in the upper portion of the left kidney. Normal enhancement is noted otherwise. The bladder is partially distended with opacified and non-opacified urine. No definitive bladder mass is seen.  Diffuse ascites is noted consistent with the known portal hypertension. 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 There is a spontaneous decompression of this dilated mesenteric  vein into the inferior vena cava. Within these varices as well as within branches of the superior mesenteric vein there is evidence of air although no portal venous air is noted within the liver. These changes suggest bowel ischemia with changes of pneumatosis noted within multiple dilated small bowel loops. A transition zone is noted in the distal ileum just before the terminal ileum. No definitive mass lesion is seen. The etiology of this transition zone is uncertain.  Diverticular change of the sigmoid  colon is seen without evidence of diverticulitis. Postsurgical changes are noted in the left hip. Diffuse degenerative changes of the lumbar spine are seen. Compression deformities are again identified within the lumbar spine of a chronic nature.  IMPRESSION: Changes of small-bowel obstruction with evidence of pneumatosis within the wall of the small bowel and air within the branches of the superior mesenteric vein as well as a large decompressive varix within the mid abdomen arising from the inferior mesenteric vein. Surgical consultation is recommended.  Cirrhotic change of the liver with associated changes of portal hypertension to include the previously described varices as well as ascites.  There are no findings to account for the patient's history of hematuria.  Critical Value/emergent results were called by telephone at the time of interpretation on 04/23/2014 at 7:11 pm to Dr. Johney Maine, who verbally acknowledged these results.   Electronically Signed   By: Inez Catalina M.D.   On: 04/27/2014 19:27   Dg Chest 1 View  04/30/2014   CLINICAL DATA:  Cough this morning; Ileus. Dyspnea.  EXAM: CHEST  1 VIEW  COMPARISON:  04/07/2014  FINDINGS: Cardiac silhouette is borderline enlarged. No mediastinal or hilar masses. No convincing adenopathy.  There is hazy airspace opacity projecting in the right mid lung, new from prior exam. Small area of pneumonia is suspected. There is mild linear opacity at the left lung base consistent with scarring and/or atelectasis. This is stable.  Remainder of the lungs is clear. No pleural effusion or pneumothorax.  Bony thorax is demineralized but grossly intact.  IMPRESSION: Small area of right mid lung, likely right upper lobe, hazy airspace opacity consistent with pneumonia given the history of cough.   Electronically Signed   By: Lajean Manes M.D.   On: 05/03/2014 09:34   Dg Chest Port 1 View  05/02/2014   CLINICAL DATA:  Central line placement.  EXAM: PORTABLE CHEST - 1 VIEW   COMPARISON:  04/20/2014  FINDINGS: The patient is mildly rotated to the right, partially distorting the mediastinum. The endotracheal tube has been placed and terminates approximately 2 cm above the carina. Right neck catheter courses into the upper mediastinum and is directed leftward, possibly in the central aspect of the left brachiocephalic vein. Enteric tube terminates in the midline of the upper abdomen likely in the gastric body.  Cardiomediastinal silhouette is grossly unchanged. Lung volumes are mildly diminished with increased pulmonary vascular congestion. Patchy right mid lung opacities have increased, now with an area of confluent consolidation and/or pleural fluid along the minor fissure. Veiling opacity in the right lung base with obscuration of the costophrenic angle is consistent with a small pleural effusion. Patchy bilateral lower lobe opacities are also present. No pneumothorax is identified. No acute osseous abnormality is seen. Lucency partially visualized under the hemidiaphragm of the right upper quadrant is compatible with intraperitoneal free air related to interval abdominal surgery.  IMPRESSION: 1. Support apparatus as above. Right jugular catheter courses leftward in the superior mediastinum, possibly partially into the  left brachiocephalic vein. Recommend correlation with blood return to ensure intravascular, venous positioning and exclude arterial or extravascular placement. 2. Increased pulmonary vascular congestion and worsening right midlung and bibasilar opacities, concerning for pneumonia although a degree of mild edema is also possible. 3. Small right pleural effusion.   Electronically Signed   By: Logan Bores   On: 05/02/2014 01:07   Dg Abd 2 Views  04/23/2014   CLINICAL DATA:  Abdominal distention, nausea and vomiting today.  EXAM: ABDOMEN - 2 VIEW  COMPARISON:  Previous imaging examinations.  FINDINGS: Multiple dilated small bowel loops containing multiple air-fluid  levels. Gas and stool in mildly prominent right colon and hepatic flexure. No more distal colon gas seen other than some gas in the rectum. No free peritoneal air. Left hip fixation hardware. Lumbar and lower thoracic spine degenerative changes.  IMPRESSION: Partial small bowel obstruction. Colonic obstruction at the level of the transverse colon is less likely.   Electronically Signed   By: Claudie Revering M.D.   On: 04/16/2014 09:27    Anti-infectives: Anti-infectives    Start     Dose/Rate Route Frequency Ordered Stop   04/22/2014 2200  vancomycin (VANCOCIN) IVPB 1000 mg/200 mL premix     1,000 mg 200 mL/hr over 60 Minutes Intravenous Every 12 hours 04/20/2014 1020     04/17/2014 1400  imipenem-cilastatin (PRIMAXIN) 500 mg in sodium chloride 0.9 % 100 mL IVPB  Status:  Discontinued     500 mg 200 mL/hr over 30 Minutes Intravenous 3 times per day 04/22/2014 0958 05/07/2014 1025   05/09/2014 1200  imipenem-cilastatin (PRIMAXIN) 500 mg in sodium chloride 0.9 % 100 mL IVPB     500 mg 200 mL/hr over 30 Minutes Intravenous 4 times per day 04/20/2014 1025     04/24/2014 1100  vancomycin (VANCOCIN) 2,000 mg in sodium chloride 0.9 % 500 mL IVPB     2,000 mg 250 mL/hr over 120 Minutes Intravenous  Once 05/04/2014 1020 04/24/2014 1330   05/07/2014 2200  rifaximin (XIFAXAN) tablet 550 mg  Status:  Discontinued     550 mg Oral 2 times daily 05/02/2014 1937 04/17/2014 0834       Assessment/Plan  POD 2, s/p ex lap with LOA -patient has some BS and no NGT output.  Patient likely can have NGT removed, will at least clamp it for now and will d/w Dr. Redmond Pulling -once extubated, patient needs to mobilize  Cirrhosis -per primary service DVT prophylaxis -SCDs/Lovenox Leukocytosis -WBC 14K today.  Patient on primaxin and vanc for suspected HCAP.  No evidence of infection in abdomen at time of surgery.    LOS: 6 days    Zoraya Fiorenza E 05/03/2014, 7:48 AM Pager: [phone]

## 2014-05-03 NOTE — Procedures (Signed)
Extubation Procedure Note  Patient Details:   Name: Clifford Cook DOB: 1945-07-29 MRN: [id number]   Airway Documentation:     Evaluation  O2 sats: stable throughout Complications: No apparent complications Patient did tolerate procedure well. Bilateral Breath Sounds: Clear, Diminished Suctioning: Oral, Airway Yes   Pt extubated to 3L [place]. Pt stable throughout with no complications. Pt able to speak name and is asking for pain meds and ice chips post extubation. Pt able to cough and encouraged to do so due to tan/brown thick secretions while intubated. Pt instructed on using yankauer. Pt with diminished BS throughout. RT will continue to monitor.   Jesse Sans 05/03/2014, 9:11 AM

## 2014-05-03 NOTE — Progress Notes (Signed)
PULMONARY  / CRITICAL CARE MEDICINE CONSULTATION   Name: Clifford Cook MRN: [id number] DOB: [birth year]    ADMISSION DATE:  04/30/2014 CONSULTATION DATE: 05/02/14  REQUESTING CLINICIAN: Dr. Johney Maine PRIMARY SERVICE: Surgery  CHIEF COMPLAINT:  Small bowel obstruction  BRIEF PATIENT DESCRIPTION:   53yom with PMH of NASH cirrhosis, CKD III, COPD presented on 05/14/2014 with worsening Abd distention and elevated ammonia.  He developed pneumatosis on Abd imaging prompting an emergent ex-lap on 4/17.  We were consulted for ICU comanagement.  SIGNIFICANT EVENTS / STUDIES:  04/22/2014:  Admitted with worsening ascites and abd distention 04/29/14:  MRI L spine with multiple subacute compression fx's 05/07/2014:  Developed ileus vs SBO with vomiting.  CT Abd/ pelvis concerning for pneumatosis.  Taken to OR for exlap.  Transferred to ICU. Had lysis of adhesions, but no evidence of spillage  4/18: on pressors, still w/ metabolic acidosis.  4/19 extubated.   LINES / TUBES: RIJ TLC:  05/05/2014--> Foley:  05/13/2014-->    SUBJECTIVE: feels well, wants to come off vent, weaning pressors   VITAL SIGNS: Temp:  [98.3 F (36.8 C)-98.9 F (37.2 C)] 98.5 F (36.9 C) (04/19 0800) Pulse Rate:  [90-111] 95 (04/19 0720) Resp:  [15-31] 29 (04/19 0720) BP: (98-129)/(42-56) 111/55 mmHg (04/19 0720) SpO2:  [93 %-100 %] 99 % (04/19 0720) Arterial Line BP: (91-133)/(44-58) 124/57 mmHg (04/19 0600) FiO2 (%):  [40 %-70 %] 40 % (04/19 0720) HEMODYNAMICS: CVP:  [7 mmHg-15 mmHg] 11 mmHg VENTILATOR SETTINGS: Vent Mode:  [-] PRVC FiO2 (%):  [40 %-70 %] 40 % Set Rate:  [25 bmp] 25 bmp Vt Set:  [550 mL] 550 mL PEEP:  [5 cmH20] 5 cmH20 Plateau Pressure:  [10 cmH20-22 cmH20] 10 cmH20 INTAKE / OUTPUT: Intake/Output      04/18 0701 - 04/19 0700 04/19 0701 - 04/20 0700   P.O.     I.V. (mL/kg) 3607.6 (40.9)    Blood     Other 0    NG/GT 30    IV Piggyback 500    Total Intake(mL/kg) 4137.6 (47)    Urine (mL/kg/hr) 395 (0.2)  25 (0.1)   Emesis/NG output 1050 (0.5)    Other     Blood     Total Output 1445 25   Net +2692.6 -25          PHYSICAL EXAMINATION: General: Intubated, no distress on SBT  Neuro:  Awake, f/c, approp, some generalized weakness  HEENT:  PERRL, orally intubated  Cardiovascular:  Tachy, reg Lungs: decreased bases   Abdomen:  Distended, midline bandage in place, hypoactive bowel sounds  Musculoskeletal:  3 + LE edema  Skin:  Warm, spider angiomas noted on chest  LABS:  CBC  Recent Labs Lab 05/04/2014 0943 05/02/14 0415 05/03/14 0420  WBC 10.7* 14.2* 14.9*  HGB 9.7* 9.9* 8.4*  HCT 29.3* 31.8* 25.1*  PLT 132* 193 152   Coag's  Recent Labs Lab 04/29/14 0513 04/22/2014 0504  INR 1.50* 1.51*   BMET  Recent Labs Lab 05/02/14 1200 05/02/14 2132 05/03/14 0420  NA 134* 129* 133*  K 4.0 4.1 4.4  CL 102 101 103  CO2 23 23 24   BUN 17 22 26*  CREATININE 1.27 1.51* 1.61*  GLUCOSE 215* 151* 160*   Electrolytes  Recent Labs Lab 04/29/14 0513  05/02/14 1200 05/02/14 2132 05/03/14 0420  CALCIUM 7.4*  < > 7.0* 6.9* 7.0*  MG 1.9  --   --   --   --   < > =  values in this interval not displayed. Sepsis Markers  Recent Labs Lab 04/29/14 0513 04/23/2014 0943 05/03/2014 1211  LATICACIDVEN 1.5 2.7* 3.5*   ABG  Recent Labs Lab 05/02/14 1200 05/02/14 2047 05/03/14 0445  PHART 7.423 7.465* 7.445  PCO2ART 34.8* 34.8* 36.6  PO2ART 110.0* 71.6* 90.9   Liver Enzymes  Recent Labs Lab 04/29/14 0513 04/28/2014 0504 05/02/14 0415  AST 33 36 42*  ALT 22 23 21   ALKPHOS 125* 146* 128*  BILITOT 3.2* 3.9* 3.7*  ALBUMIN 1.9* 2.0* 2.1*   Cardiac Enzymes No results for input(s): TROPONINI, PROBNP in the last 168 hours. Glucose No results for input(s): GLUCAP in the last 168 hours.  Imaging Ct Abdomen Pelvis W Wo Contrast  04/18/2014   CLINICAL DATA:  Nausea and vomiting and abdominal distention  EXAM: CT ABDOMEN AND PELVIS WITHOUT AND WITH CONTRAST  TECHNIQUE:  Multidetector CT imaging of the abdomen and pelvis was performed following the standard protocol before and following the bolus administration of intravenous contrast.  CONTRAST:  152mL OMNIPAQUE IOHEXOL 300 MG/ML  SOLN  COMPARISON:  None.  FINDINGS: Lung bases demonstrate bibasilar pneumonia of right worse than left with small associated pleural effusions.  The liver is small and nodular consistent with underlying cirrhotic change. No focal mass lesion is noted. The gallbladder is well distended. The spleen, adrenal glands and pancreas are within normal limits.  The kidneys are well visualized bilaterally. A nonobstructing left renal calculus is noted. Parapelvic cyst is noted in the upper portion of the left kidney. Normal enhancement is noted otherwise. The bladder is partially distended with opacified and non-opacified urine. No definitive bladder mass is seen.  Diffuse ascites is noted consistent with the known portal hypertension. 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 There is a spontaneous decompression of this dilated mesenteric vein into the inferior vena cava. Within these varices as well as within branches of the superior mesenteric vein there is evidence of air although no portal venous air is noted within the liver. These changes suggest bowel ischemia with changes of pneumatosis noted within multiple dilated small bowel loops. A transition zone is noted in the distal ileum just before the terminal ileum. No definitive mass lesion is seen. The etiology of this transition zone is uncertain.  Diverticular change of the sigmoid colon is seen without evidence of diverticulitis. Postsurgical changes are noted in the left hip. Diffuse degenerative changes of the lumbar spine are seen. Compression deformities are again identified within the lumbar spine of a chronic nature.  IMPRESSION: Changes of  small-bowel obstruction with evidence of pneumatosis within the wall of the small bowel and air within the branches of the superior mesenteric vein as well as a large decompressive varix within the mid abdomen arising from the inferior mesenteric vein. Surgical consultation is recommended.  Cirrhotic change of the liver with associated changes of portal hypertension to include the previously described varices as well as ascites.  There are no findings to account for the patient's history of hematuria.  Critical Value/emergent results were called by telephone at the time of interpretation on 04/21/2014 at 7:11 pm to Dr. Johney Maine, who verbally acknowledged these results.   Electronically Signed   By: Inez Catalina M.D.   On: 05/06/2014 19:27   Dg Chest 1 View  05/09/2014   CLINICAL DATA:  Cough this morning; Ileus. Dyspnea.  EXAM: CHEST  1 VIEW  COMPARISON:  04/07/2014  FINDINGS: Cardiac  silhouette is borderline enlarged. No mediastinal or hilar masses. No convincing adenopathy.  There is hazy airspace opacity projecting in the right mid lung, new from prior exam. Small area of pneumonia is suspected. There is mild linear opacity at the left lung base consistent with scarring and/or atelectasis. This is stable.  Remainder of the lungs is clear. No pleural effusion or pneumothorax.  Bony thorax is demineralized but grossly intact.  IMPRESSION: Small area of right mid lung, likely right upper lobe, hazy airspace opacity consistent with pneumonia given the history of cough.   Electronically Signed   By: Lajean Manes M.D.   On: 05/08/2014 09:34   Dg Chest Port 1 View  05/02/2014   CLINICAL DATA:  Central line placement.  EXAM: PORTABLE CHEST - 1 VIEW  COMPARISON:  04/30/2014  FINDINGS: The patient is mildly rotated to the right, partially distorting the mediastinum. The endotracheal tube has been placed and terminates approximately 2 cm above the carina. Right neck catheter courses into the upper mediastinum and is  directed leftward, possibly in the central aspect of the left brachiocephalic vein. Enteric tube terminates in the midline of the upper abdomen likely in the gastric body.  Cardiomediastinal silhouette is grossly unchanged. Lung volumes are mildly diminished with increased pulmonary vascular congestion. Patchy right mid lung opacities have increased, now with an area of confluent consolidation and/or pleural fluid along the minor fissure. Veiling opacity in the right lung base with obscuration of the costophrenic angle is consistent with a small pleural effusion. Patchy bilateral lower lobe opacities are also present. No pneumothorax is identified. No acute osseous abnormality is seen. Lucency partially visualized under the hemidiaphragm of the right upper quadrant is compatible with intraperitoneal free air related to interval abdominal surgery.  IMPRESSION: 1. Support apparatus as above. Right jugular catheter courses leftward in the superior mediastinum, possibly partially into the left brachiocephalic vein. Recommend correlation with blood return to ensure intravascular, venous positioning and exclude arterial or extravascular placement. 2. Increased pulmonary vascular congestion and worsening right midlung and bibasilar opacities, concerning for pneumonia although a degree of mild edema is also possible. 3. Small right pleural effusion.   Electronically Signed   By: Logan Bores   On: 05/02/2014 01:07   Dg Abd 2 Views  04/16/2014   CLINICAL DATA:  Abdominal distention, nausea and vomiting today.  EXAM: ABDOMEN - 2 VIEW  COMPARISON:  Previous imaging examinations.  FINDINGS: Multiple dilated small bowel loops containing multiple air-fluid levels. Gas and stool in mildly prominent right colon and hepatic flexure. No more distal colon gas seen other than some gas in the rectum. No free peritoneal air. Left hip fixation hardware. Lumbar and lower thoracic spine degenerative changes.  IMPRESSION: Partial small  bowel obstruction. Colonic obstruction at the level of the transverse colon is less likely.   Electronically Signed   By: Claudie Revering M.D.   On: 05/07/2014 09:27     CXR: R midlung and basilar opacities, specifically right mid-lung. increased vascular congestion on 4/18  ASSESSMENT / PLAN:  PULMONARY A: Intubated for OR procedure now with severe metabolic acidosis  H/o COPD ??PNA >looks comfortable on SBT. Gas exchange and metabolic panel has improved.  P:   Extubate  Aggressive pulm hygiene: mobilize, start IS   CARDIOVASCULAR A: Sepsis/septic shock Probably some degree of low oncotic pressure related hypotension given low protein stores.   P:   Keep euvolemic  NE to keep MAPS >65 prn ABX as above Monitoring CVP (  goal 8-12)  RENAL A: Severe metabolic acidosis (both +AG and NG component) suspect 2/2 increased lactate in setting of liver disease and possible SBO-->resolved  Chronic hyponatremia likely 2/2 liver dz Mild AKI. Likely in setting of third spacing and volume depletion  P:   Monitor UOP closely Monitor BMPs daily Keep euvolemic   GASTROINTESTINAL A: s/p ex-lap w/ lysis of adhesions.  No intrabdominal soilage noted.   P:   GI surgery following closely NGT clamped. Defer diet to surg   HEMATOLOGIC A: Anemia, likely of chronic disease.  No signs of active bleeding. 200cc EBL in OR Need for DVT prophy P:   Monitor CBC Transfuse for Hg<7 Lovenox for DVT prophy  INFECTIOUS A: Possible SBO with concern for perforation.  Although, no e/o this in OR Possible HCAP P:   CULTURES: Blood cx x 2 from 4/17:>>> Paracentesis fluid cx from 4/15>>>  ANTIBIOTICS: Primaxin:  04/26/2014>>> Vanc: 04/25/2014>>>  ENDOCRINE A: No acute issues P:   Trend fastin glucose   NEUROLOGIC A: Post-op pain  P:   Prn fentanyl for pain   TODAY'S SUMMARY: Taken to OR for concern for pneumatosis.  Ex-lap remarkable for ascites and a few adhesions but no e/o perf.   Remained intubated postop.  Metabolic acidosis improved. Still on low dose pressors. Ready for extubation. Defer diet to surgery. Will mobilize. No change in ABX. Has slight bump in creatinine. Will maintain euvolemia.   Erick Colace ACNP-BC [place] Pager # [phone] OR # [phone] if no answer   05/03/2014, 8:58 AM   Attending:  I have seen and examined the patient with nurse practitioner/resident and agree with the note above.   Mr. Isidore is stronger today, his kidney function has worsened slightly but his renal function is stable  Lungs with a few crackles on exam, surgical wounds intact, he is awake and alert  Impression/plan: Septic shock> titrate levophed to MAP > 60 (baseline BP 100/50 Bowel obstruction, sigmoid colon, s/p laparoscopy> diet per surgery AKI > slightly worse today, KVO fluids given ascites, add albumin  My cc time 35 minutes, family updated bedside  Roselie Awkward, MD [facility] Pager: [phone] Cell: [phone] If no response, call [phone]

## 2014-05-03 NOTE — Progress Notes (Signed)
ANTIBIOTIC CONSULT NOTE - FOLLOW UP  Pharmacy Consult for Vancomycin, Primaxin Indication: HCAP  Allergies  Allergen Reactions  . Pulmicort [Budesonide] Shortness Of Breath    Causes stridor  . Penicillins Other (See Comments)    Rash hives, white spots.    Patient Measurements: Height: 5\' 8"  (172.7 cm) Weight: 194 lb 3.6 oz (88.1 kg) IBW/kg (Calculated) : 68.4  Vital Signs: Temp: 98.5 F (36.9 C) (04/19 0800) Temp Source: Oral (04/19 0800) BP: 111/55 mmHg (04/19 0720) Pulse Rate: 101 (04/19 0915) Intake/Output from previous day: 04/18 0701 - 04/19 0700 In: 4137.6 [I.V.:3607.6; NG/GT:30; IV Piggyback:500] Out: 1445 [Urine:395; Emesis/NG output:1050] Intake/Output from this shift: Total I/O In: -  Out: 25 [Urine:25]  Labs:  Recent Labs  04/23/2014 0943 05/02/14 0415 05/02/14 1200 05/02/14 2132 05/03/14 0420  WBC 10.7* 14.2*  --   --  14.9*  HGB 9.7* 9.9*  --   --  8.4*  PLT 132* 193  --   --  152  CREATININE  --  0.98 1.27 1.51* 1.61*   Estimated Creatinine Clearance: 46.7 mL/min (by C-G formula based on Cr of 1.61). No results for input(s): VANCOTROUGH, VANCOPEAK, VANCORANDOM, GENTTROUGH, GENTPEAK, GENTRANDOM, TOBRATROUGH, TOBRAPEAK, TOBRARND, AMIKACINPEAK, AMIKACINTROU, AMIKACIN in the last 72 hours.   Microbiology: Recent Results (from the past 720 hour(s))  Body fluid culture     Status: None (Preliminary result)   Collection Time: 04/29/14  9:49 AM  Result Value Ref Range Status   Specimen Description ASCITIC  Final   Special Requests NONE  Final   Gram Stain   Final    RARE WBC PRESENT, PREDOMINANTLY MONONUCLEAR NO ORGANISMS SEEN Performed at [facility]    Culture   Final    NO GROWTH 1 DAY Performed at [facility]    Report Status PENDING  Incomplete  Culture, blood (routine x 2)     Status: None (Preliminary result)   Collection Time: 04/22/2014 12:11 PM  Result Value Ref Range Status   Specimen Description BLOOD LEFT ARM   Final   Special Requests   Final    BOTTLES DRAWN AEROBIC AND ANAEROBIC 10CC BOTH BOTTLES   Culture   Final           BLOOD CULTURE RECEIVED NO GROWTH TO DATE CULTURE WILL BE HELD FOR 5 DAYS BEFORE ISSUING A FINAL NEGATIVE REPORT Performed at [facility]    Report Status PENDING  Incomplete  Culture, blood (routine x 2)     Status: None (Preliminary result)   Collection Time: 05/09/2014 12:22 PM  Result Value Ref Range Status   Specimen Description BLOOD LEFT HAND  Final   Special Requests   Final    BOTTLES DRAWN AEROBIC AND ANAEROBIC BAA 10CC BOTH BOTTLES   Culture   Final           BLOOD CULTURE RECEIVED NO GROWTH TO DATE CULTURE WILL BE HELD FOR 5 DAYS BEFORE ISSUING A FINAL NEGATIVE REPORT Performed at [facility]    Report Status PENDING  Incomplete  Urine culture     Status: None   Collection Time: 05/02/14  5:00 AM  Result Value Ref Range Status   Specimen Description URINE, CLEAN CATCH  Final   Special Requests NONE  Final   Colony Count NO GROWTH Performed at [facility]   Final   Culture NO GROWTH Performed at [facility]   Final   Report Status 05/03/2014 FINAL  Final  Anti-infectives    Start     Dose/Rate Route Frequency Ordered Stop   05/09/2014 2200  vancomycin (VANCOCIN) IVPB 1000 mg/200 mL premix     1,000 mg 200 mL/hr over 60 Minutes Intravenous Every 12 hours 05/12/2014 1020     04/23/2014 1400  imipenem-cilastatin (PRIMAXIN) 500 mg in sodium chloride 0.9 % 100 mL IVPB  Status:  Discontinued     500 mg 200 mL/hr over 30 Minutes Intravenous 3 times per day 04/16/2014 0958 04/21/2014 1025   05/14/2014 1200  imipenem-cilastatin (PRIMAXIN) 500 mg in sodium chloride 0.9 % 100 mL IVPB     500 mg 200 mL/hr over 30 Minutes Intravenous 4 times per day 05/14/2014 1025     05/12/2014 1100  vancomycin (VANCOCIN) 2,000 mg in sodium chloride 0.9 % 500 mL IVPB     2,000 mg 250 mL/hr over 120 Minutes Intravenous  Once 04/29/2014 1020 04/21/2014 1330    04/24/2014 2200  rifaximin (XIFAXAN) tablet 550 mg  Status:  Discontinued     550 mg Oral 2 times daily 04/20/2014 1937 04/30/2014 0834     Assessment 68 y/oM with PMH of NASH cirrhosis, anemia, thrombocytopenia, CKD stage III, Strep A PNA, COPD, hepatic encephalopathy who presented to [facility] ED on 4/13 with generalized weakness, abdominal distention, and lower back pain, found to have hyperammonemia and ascites.  This morning, patient developed ileus and vomiting, so X-rays obtained. CXR 4/17 suggests RML/RUL infiltrate, and AXR suggests possible pSBO.  Pharmacy consulted to dose vancomycin and now Primaxin for HCAP.  4/17 >> Vancomycin >> 4/17 >> Primaxin >>    Temp: afebrile since admission, recent hypothermia WBC: moderately elevated, previously wnl Renal: new AKI, ; SCr 1.61 (baseline 1.0); CrCl 47 CG, 44 N  4/15 ascitic fluid: ngtd 4/17 blood x 2: NGF 4/18 urine: NGF  Dose changes/levels: Note patient missed PM dose 4/17 after transferring out of OR   Goal of Therapy:   Vancomycin trough level 15-20 mcg/ml   Eradication of infection  Appropriate antibiotic dosing for indication and renal function  Plan:  Day 3 of antibiotics, new worsening renal function . Continue vancomycin 1g IV q12 hr . Will obtain vancomycin trough before tonight's dose (renal function now qualifies patient for 750 mg IV q12), but expect trough could be slightly low d/t missed dose 4/17. . Reduce Primaxin to 500 mg IV q8 hr  Follow clinical course, renal function, culture results as available  Follow for de-escalation of antibiotics and LOT   Reuel Boom, PharmD Pager: [phone] 05/03/2014, 10:10 AM

## 2014-05-04 DIAGNOSIS — K5669 Other intestinal obstruction: Secondary | ICD-10-CM

## 2014-05-04 DIAGNOSIS — E872 Acidosis, unspecified: Secondary | ICD-10-CM | POA: Insufficient documentation

## 2014-05-04 LAB — BASIC METABOLIC PANEL
Anion gap: 8 (ref 5–15)
BUN: 34 mg/dL — ABNORMAL HIGH (ref 6–23)
CALCIUM: 7.4 mg/dL — AB (ref 8.4–10.5)
CO2: 24 mmol/L (ref 19–32)
CREATININE: 1.34 mg/dL (ref 0.50–1.35)
Chloride: 103 mmol/L (ref 96–112)
GFR calc Af Amer: 61 mL/min — ABNORMAL LOW (ref >90)
GFR calc non Af Amer: 52 mL/min — ABNORMAL LOW (ref >90)
Glucose, Bld: 101 mg/dL — ABNORMAL HIGH (ref 70–99)
Potassium: 4.1 mmol/L (ref 3.5–5.1)
Sodium: 135 mmol/L (ref 135–145)

## 2014-05-04 LAB — BODY FLUID CULTURE: CULTURE: NO GROWTH

## 2014-05-04 MED ORDER — ALBUMIN HUMAN 25 % IV SOLN
25.0000 g | Freq: Once | INTRAVENOUS | Status: AC
  Administered 2014-05-04: 25 g via INTRAVENOUS
  Filled 2014-05-04: qty 50

## 2014-05-04 NOTE — Evaluation (Signed)
Physical Therapy Evaluation Patient Details Name: Clifford Cook MRN: [id number] DOB: 08-Jul-1945 Today's Date: 05/04/2014   History of Present Illness  23yom with PMH of NASH cirrhosis, CKD III, COPD presented on 04/26/2014 with worsening Abd distention and elevated ammonia. He developed pneumatosis on Abd imaging prompting an emergent ex-lap on 4/17. Being followed by IR for compression fractures of L 2,3,5. was for possible VP/KP prior to emrgent  surgery on abdomen.  Clinical Impression  Patient did consent to sit at the edge  And stand, repositioned back in bed with increased comfort. Patient to have paracentesis in a few minutes.Did not have ABDominal binder in room. Patient will benefit from PT to address problems listed in note below. Patient had been previously evaluted last week and was asked to sign off for imending IR procedure for compression fractures.  Follow Up Recommendations SNF;Supervision/Assistance - 24 hour    Equipment Recommendations  None recommended by PT    Recommendations for Other Services       Precautions / Restrictions Precautions Precautions: Fall Precaution Comments: midline abdominal incision, needs premeds Required Braces or Orthoses: Other Brace/Splint Other Brace/Splint: abdominal binder      Mobility  Bed Mobility Overal bed mobility: Needs Assistance Bed Mobility: Rolling;Sidelying to Sit;Sit to Sidelying Rolling: Mod assist;+2 for safety/equipment Sidelying to sit: Mod assist;+2 for safety/equipment     Sit to sidelying: Max assist;+2 for physical assistance;+2 for safety/equipment General bed mobility comments: Instructed pt in logroll technique to aid with bed mobility. Performed fairly well getting to EOB . Assist for trunk and bil LEs. Increased time.   Transfers Overall transfer level: Needs assistance Equipment used: Rolling walker (2 wheeled) Transfers: Sit to/from Stand Sit to Stand: Mod assist;+2 physical assistance;+2  safety/equipment;From elevated surface         General transfer comment: Assist to rise, stabilize, cotnrol descent. VCS safety, technique, hand placement  Ambulation/Gait Ambulation/Gait assistance: Mod assist;+2 physical assistance;+2 safety/equipment   Assistive device: Rolling walker (2 wheeled)       General Gait Details: 4 small side steps along edge of bed.  Stairs            Wheelchair Mobility    Modified Rankin (Stroke Patients Only)       Balance   Sitting-balance support: Bilateral upper extremity supported;Feet supported Sitting balance-Leahy Scale: Fair     Standing balance support: Bilateral upper extremity supported Standing balance-Leahy Scale: Poor                               Pertinent Vitals/Pain Pain Score: 9  Pain Location: back and abdomen Pain Descriptors / Indicators: Aching;Constant;Sharp;Stabbing;Discomfort;Tightness Pain Intervention(s): Limited activity within patient's tolerance;Premedicated before session;Repositioned    Home Living Family/patient expects to be discharged to:: Private residence Living Arrangements: Spouse/significant other Available Help at Discharge: Family Type of Home: House Home Access: Ramped entrance     Home Layout: One level Home Equipment: Environmental consultant - 2 wheels;Cane - single point Additional Comments: pt has a single step down to the den.      Prior Function Level of Independence: Needs assistance   Gait / Transfers Assistance Needed: using  cane up until a couple of weeks ago. Now using RW           Hand Dominance        Extremity/Trunk Assessment   Upper Extremity Assessment: Generalized weakness           Lower  Extremity Assessment: Generalized weakness      Cervical / Trunk Assessment: Normal;Other exceptions  Communication      Cognition Arousal/Alertness: Awake/alert Behavior During Therapy: Anxious Overall Cognitive Status: Within Functional Limits for  tasks assessed                      General Comments      Exercises        Assessment/Plan    PT Assessment Patient needs continued PT services  PT Diagnosis Difficulty walking;Generalized weakness;Acute pain   PT Problem List Decreased strength;Decreased activity tolerance;Decreased mobility;Decreased knowledge of precautions;Decreased safety awareness;Decreased knowledge of use of DME;Pain  PT Treatment Interventions DME instruction;Gait training;Functional mobility training;Therapeutic activities;Therapeutic exercise;Patient/family education   PT Goals (Current goals can be found in the Care Plan section) Acute Rehab PT Goals Patient Stated Goal: to have no pain PT Goal Formulation: With patient Time For Goal Achievement: 05/12/14 Potential to Achieve Goals: Good    Frequency Min 3X/week   Barriers to discharge        Co-evaluation               End of Session   Activity Tolerance: Patient limited by pain;Patient limited by fatigue Patient left: in bed;with call bell/phone within reach;with bed alarm set Nurse Communication: Mobility status         Time: 0539-7673 PT Time Calculation (min) (ACUTE ONLY): 18 min   Charges:   PT Evaluation $Initial PT Evaluation Tier I: 1 Procedure     PT G CodesClaretha Cooper 05/04/2014, 10:13 AM Tresa Endo PT [phone]

## 2014-05-04 NOTE — Progress Notes (Signed)
Patient ID: Clifford [facility], male   DOB: July 08, 1945, 69 y.o.   MRN: [id number] 3 Days Post-Op  Subjective: Pt feels ok today, but c/o abdominal distention and tightness from ascites.  Unsure if he has passed flatus, but no nausea.  Objective: Vital signs in last 24 hours: Temp:  [97.8 F (36.6 C)-98.1 F (36.7 C)] 98.1 F (36.7 C) (04/20 0400) Pulse Rate:  [95-104] 102 (04/20 0600) Resp:  [12-22] 14 (04/20 0600) SpO2:  [93 %-100 %] 99 % (04/20 0600) Arterial Line BP: (82-128)/(49-73) 113/52 mmHg (04/20 0600) Weight:  [93.8 kg (206 lb 12.7 oz)] 93.8 kg (206 lb 12.7 oz) (04/20 0400) Last BM Date: 04/30/14  Intake/Output from previous day: 04/19 0701 - 04/20 0700 In: 1866.2 [I.V.:1136.2; NG/GT:30; IV Piggyback:700] Out: 640 [Urine:640] Intake/Output this shift: Total I/O In: 20 [I.V.:20] Out: 150 [Urine:150]  PE: Abd: distended and tight secondary to ascites, some BS, incision is c/d/i with staples, appropriately tender Heart: regular Lungs: with some expiratory wheeze noted with anterior auscultation   Lab Results:   Recent Labs  05/02/14 0415 05/03/14 0420  WBC 14.2* 14.9*  HGB 9.9* 8.4*  HCT 31.8* 25.1*  PLT 193 152   BMET  Recent Labs  05/03/14 0420 05/03/14 1245  NA 133* 131*  K 4.4 4.1  CL 103 101  CO2 24 25  GLUCOSE 160* 143*  BUN 26* 29*  CREATININE 1.61* 1.69*  CALCIUM 7.0* 7.2*   PT/INR No results for input(s): LABPROT, INR in the last 72 hours. CMP     Component Value Date/Time   NA 131* 05/03/2014 1245   K 4.1 05/03/2014 1245   CL 101 05/03/2014 1245   CO2 25 05/03/2014 1245   GLUCOSE 143* 05/03/2014 1245   BUN 29* 05/03/2014 1245   CREATININE 1.69* 05/03/2014 1245   CALCIUM 7.2* 05/03/2014 1245   PROT 5.4* 05/02/2014 0415   ALBUMIN 2.1* 05/02/2014 0415   AST 42* 05/02/2014 0415   ALT 21 05/02/2014 0415   ALKPHOS 128* 05/02/2014 0415   BILITOT 3.7* 05/02/2014 0415   GFRNONAA 40* 05/03/2014 1245   GFRAA 46* 05/03/2014 1245   Lipase      Component Value Date/Time   LIPASE 101* 04/25/2014 1222       Studies/Results: No results found.  Anti-infectives: Anti-infectives    Start     Dose/Rate Route Frequency Ordered Stop   05/03/14 1400  imipenem-cilastatin (PRIMAXIN) 500 mg in sodium chloride 0.9 % 100 mL IVPB     500 mg 200 mL/hr over 30 Minutes Intravenous 3 times per day 05/03/14 1021     05/07/2014 2200  vancomycin (VANCOCIN) IVPB 1000 mg/200 mL premix  Status:  Discontinued     1,000 mg 200 mL/hr over 60 Minutes Intravenous Every 12 hours 05/02/2014 1020 05/04/14 0343   05/02/2014 1400  imipenem-cilastatin (PRIMAXIN) 500 mg in sodium chloride 0.9 % 100 mL IVPB  Status:  Discontinued     500 mg 200 mL/hr over 30 Minutes Intravenous 3 times per day 04/22/2014 0958 05/11/2014 1025   04/26/2014 1200  imipenem-cilastatin (PRIMAXIN) 500 mg in sodium chloride 0.9 % 100 mL IVPB  Status:  Discontinued     500 mg 200 mL/hr over 30 Minutes Intravenous 4 times per day 05/09/2014 1025 05/03/14 1021   04/27/2014 1100  vancomycin (VANCOCIN) 2,000 mg in sodium chloride 0.9 % 500 mL IVPB     2,000 mg 250 mL/hr over 120 Minutes Intravenous  Once 05/04/2014 1020 05/05/2014 1330   04/20/2014  2200  rifaximin (XIFAXAN) tablet 550 mg  Status:  Discontinued     550 mg Oral 2 times daily 04/23/2014 1937 05/08/2014 0834       Assessment/Plan  POD 3, s/p ex lap with LOA -pt extubated yesterday. -no nausea with NGT out.  Will give clear liquids today -cont PT and mobilization of patient -aggressive pulm toilet Cirrhosis -per primary service, but have d/w Marni Griffon about paracentesis today to help relieve some of the distention and pressure from the ascites that has reaccumulated post op since he is unable to take his lasix and aldactone they way he normally takes it at home. DVT prophylaxis -SCDs/Lovenox Leukocytosis -WBC 14K yesterday. Patient on primaxin and vanc for suspected HCAP. No evidence of infection in abdomen at time of surgery.    Acute renal insufficiency -per primary service. -foley still in place to monitor UOP, only 640 yesterday  LOS: 7 days    Clifford Cook E 05/04/2014, 8:35 AM Pager: [phone]

## 2014-05-04 NOTE — Progress Notes (Signed)
NUTRITION FOLLOW-UP  DOCUMENTATION CODES Per approved criteria  -Not Applicable   INTERVENTION: - Once diet advanced, add Boost Plus BID, each supplement provides 360 kcal and 14 g protein.  - Diet advancement per MD as medically tolerated.  - RD will continue to monitor  NUTRITION DIAGNOSIS: Inadequate protein-energy intake related to inability to consume nutrition as evidenced by NPO s/p emergent surgery with no nutrition support; ongoing  Goal: Initiation of PO diet versus nutrition support within 48 hours  Pt to meet >/= 90% estimated needs; not met  Monitor:  POC, diet advancement versus initiation of nutrition support, weight trends, labs, I/O's  Reason for Assessment: consult, new vent  69 y.o. male  Admitting Dx: SBO (small bowel obstruction)  ASSESSMENT: 89yom with PMH of NASH cirrhosis, CKD III, COPD presented on 05/05/2014 with worsening Abd distention and elevated ammonia. He developed pneumatosis on Abd imaging prompting an emergent ex-lap on 4/17.  04/30/2014: Developed ileus vs SBO with vomiting. CT Abd/ pelvis concerning for pneumatosis. Taken to OR for exlap. Transferred to ICU  Extubated 4/19  Diet upgraded to clear liquids. Pt tolerating juice and broth during RD visit. No nausea. Feels hungry. Pt does not like [employer], asked for Boost supplements, RD to add once diet advanced to full liquids or solids. Pt reports usual body weight as ~200 lbs.   Labs and medications reviewed.   Height: Ht Readings from Last 1 Encounters:  04/29/14 5' 8"  (1.727 m)    Weight: Wt Readings from Last 1 Encounters:  05/04/14 206 lb 12.7 oz (93.8 kg)    Ideal Body Weight: 154 lbs (70 kg)  % Ideal Body Weight: 126%  Wt Readings from Last 10 Encounters:  05/04/14 206 lb 12.7 oz (93.8 kg)  03/21/14 192 lb 3.9 oz (87.2 kg)  12/14/13 212 lb 1.3 oz (96.2 kg)  11/25/13 230 lb (104.327 kg)  07/13/13 198 lb (89.812 kg)  04/17/12 239 lb 8 oz (108.636 kg)    BMI:   Body mass index is 31.45 kg/(m^2).  Estimated Nutritional Needs: Kcal: 1600-1800 Protein: 130-150 kcal Fluid: 1.5L/day  Skin: Abdominal and lip incisions, ecchymosis to bilateral arms  Diet Order: Diet clear liquid Room service appropriate?: Yes; Fluid consistency:: Thin  EDUCATION NEEDS: -Education needs addressed   Intake/Output Summary (Last 24 hours) at 05/04/14 1214 Last data filed at 05/04/14 1100  Gross per 24 hour  Intake   1010 ml  Output    665 ml  Net    345 ml    Last BM: 4/15  Labs:   Recent Labs Lab 04/29/14 0513  05/03/14 0420 05/03/14 1245 05/04/14 1018  NA 131*  < > 133* 131* 135  K 3.8  < > 4.4 4.1 4.1  CL 103  < > 103 101 103  CO2 23  < > 24 25 24   BUN 9  < > 26* 29* 34*  CREATININE 0.74  < > 1.61* 1.69* 1.34  CALCIUM 7.4*  < > 7.0* 7.2* 7.4*  MG 1.9  --   --   --   --   GLUCOSE 99  < > 160* 143* 101*  < > = values in this interval not displayed.  CBG (last 3)  No results for input(s): GLUCAP in the last 72 hours.  Scheduled Meds: . antiseptic oral rinse  7 mL Mouth Rinse QID  . bisacodyl  10 mg Rectal Daily  . chlorhexidine  15 mL Mouth Rinse BID  . enoxaparin (LOVENOX) injection  40 mg Subcutaneous Q24H  . imipenem-cilastatin  500 mg Intravenous 3 times per day  . lip balm  1 application Topical BID  . pantoprazole (PROTONIX) IV  40 mg Intravenous QHS  . sodium chloride  1,000 mL Intravenous Once    Continuous Infusions: . lactated ringers 10 mL/hr at 05/04/14 1100  . norepinephrine (LEVOPHED) Adult infusion Stopped (05/03/14 1100)    Past Medical History  Diagnosis Date  . Stomach ulcer   . GI bleed   . Liver disorder   . Cirrhosis   . Complication of anesthesia     slow to wake up with colonoscopy - 2013   . Dysrhythmia     afib, aflutter - during hospitalization and resolved no cardiac followup   . Pneumonia     septic pneumonia - 10/2013   . Chronic kidney disease     hx of stage III CKD   . Thrombocytopenia   .  Pneumonia due to streptococcus, group A 10/31/2013  . Hyponatremia 10/31/2013  . Respiratory failure with hypoxia 10/31/2013  . Rib fracture 10/31/2013  . Hip fracture 12/05/2013  . Atrial flutter 12/08/2013  . Encephalopathy, hepatic   . Abscess of hand     Past Surgical History  Procedure Laterality Date  . Cataract extraction, bilateral    . Surgery for lazy eye      age 66  . Esophagogastroduodenoscopy N/A 04/16/2012    Procedure: ESOPHAGOGASTRODUODENOSCOPY (EGD);  Surgeon: Jeryl Columbia, MD;  Location: [facility];  Service: Endoscopy;  Laterality: N/A;  . Esophagogastroduodenoscopy N/A 04/17/2012    Procedure: ESOPHAGOGASTRODUODENOSCOPY (EGD);  Surgeon: Jeryl Columbia, MD;  Location: [facility];  Service: Endoscopy;  Laterality: N/A;  . Direct laryngoscopy N/A 07/13/2013    Procedure: DIRECT LARYNGOSCOPY WITH EXCISION TUMOR;  Surgeon: Rozetta Nunnery, MD;  Location: [facility];  Service: ENT;  Laterality: N/A;  . Hip pinning,cannulated Left 12/06/2013    Procedure: CANNULATED HIP PINNING;  Surgeon: Renette Butters, MD;  Location: [facility];  Service: Orthopedics;  Laterality: Left;  . Tee without cardioversion N/A 12/13/2013    Procedure: TRANSESOPHAGEAL ECHOCARDIOGRAM (TEE);  Surgeon: Larey Dresser, MD;  Location: [facility];  Service: Cardiovascular;  Laterality: N/A;  . Multiple extractions with alveoloplasty N/A 04/14/2014    Procedure: Extraction of tooth #'s 3,4,5,6,7,8,9,10,11,15,21,22,23,24,25,26,27,28 WITH ALVEOLOPLASTY;  Surgeon: Lenn Cal, DDS;  Location: [facility];  Service: Oral Surgery;  Laterality: N/A;  . Laparotomy N/A 05/09/2014    Procedure: EXPLORATORY LAPAROTOMY;  Surgeon: Michael Boston, MD;  Location: [facility];  Service: General;  Laterality: N/A;  . Laparoscopic lysis of adhesions  04/19/2014    Procedure: LAPAROSCOPIC LYSIS OF ADHESIONS;  Surgeon: Michael Boston, MD;  Location: [facility];  Service: General;;  . Paracentesis  05/13/2014     Procedure: PARACENTESIS;  Surgeon: Michael Boston, MD;  Location: [facility];  Service: General;;    Laurette Schimke [place], [place], [place]

## 2014-05-04 NOTE — Care Management Note (Signed)
CARE MANAGEMENT NOTE 05/04/2014  Patient:  Clifford [facility], Clifford Cook   Account Number:  [id number]  Date Initiated:  04/28/2014  Documentation initiated by:  Edwyna Shell  Subjective/Objective Assessment:   69 yo male admitted hyperammonemia     Action/Plan:   discharge planning   Anticipated DC Date:  05/07/2014   Anticipated DC Plan:  [place]  CM consult      Choice offered to / List presented to:             Status of service:  In process, will continue to follow Medicare Important Message given?   (If response is "NO", the following Medicare IM given date fields will be blank) Date Medicare IM given:   Medicare IM given by:   Date Additional Medicare IM given:   Additional Medicare IM given by:    Discharge Disposition:    Per UR Regulation:    If discussed at Long Length of Stay Meetings, dates discussed:    Comments:  May 04, 2014/Lilinoe Acklin L. Rosana Hoes, RN, BSN, CCM. Case Management [place] [phone] Clinicals faxed to [facility] review on [phone] successfully//extubated [id number]./ [id number] paracenthesis: [facility] total of 2.5 liters of serous sanguinous peritoneal fluid was aspirated.   The pt did develop some hypotension at the completion of liter #2. W/ SBP as low as 79. This responded to 25 gms of Albumin nicely.   04/29/14 Edwyna Shell RN BSN CM [phone] Spoke with patient and he stated that he prefers [place] for [facility] services.  04/28/14 Edwyna Shell RN BSn CM [phone] Patient lives at home and has walker, cane, BSC, and shower chair. He has had [place] services in the past with both [place] and [facility]. Daughter Maudie Mercury in room with patient. Patient and daughter stated that he does not want to go to [facility] for rehab and prefders home with [facility] services. Will continue to follow for dc needs.

## 2014-05-04 NOTE — Progress Notes (Addendum)
PULMONARY  / CRITICAL CARE MEDICINE CONSULTATION   Name: Clifford Cook MRN: [id number] DOB: 09-09-1945    ADMISSION DATE:  05/04/2014 CONSULTATION DATE: 05/02/14  REQUESTING CLINICIAN: Dr. Johney Maine PRIMARY SERVICE: Surgery  CHIEF COMPLAINT:  Small bowel obstruction  BRIEF PATIENT DESCRIPTION:   26yom with PMH of NASH cirrhosis, CKD III, COPD presented on 05/09/2014 with worsening Abd distention and elevated ammonia.  He developed pneumatosis on Abd imaging prompting an emergent ex-lap on 4/17.  We were consulted for ICU comanagement.  SIGNIFICANT EVENTS / STUDIES:  04/18/2014:  Admitted with worsening ascites and abd distention 04/29/14:  MRI L spine with multiple subacute compression fx's 04/17/2014:  Developed ileus vs SBO with vomiting.  CT Abd/ pelvis concerning for pneumatosis.  Taken to OR for exlap.  Transferred to ICU. Had lysis of adhesions, but no evidence of spillage  4/18: on pressors, still w/ metabolic acidosis.  4/19 extubated.   LINES / TUBES: RIJ TLC:  05/11/2014--> Foley:  05/05/2014-->  SUBJECTIVE:  Having abd pain and distention   VITAL SIGNS: Temp:  [97.8 F (36.6 C)-98.1 F (36.7 C)] 98.1 F (36.7 C) (04/20 0400) Pulse Rate:  [95-104] 102 (04/20 0600) Resp:  [12-22] 14 (04/20 0600) SpO2:  [93 %-100 %] 99 % (04/20 0600) Arterial Line BP: (82-128)/(49-73) 113/52 mmHg (04/20 0600) Weight:  [93.8 kg (206 lb 12.7 oz)] 93.8 kg (206 lb 12.7 oz) (04/20 0400) HEMODYNAMICS: CVP:  [4 mmHg-14 mmHg] 8 mmHg VENTILATOR SETTINGS:   INTAKE / OUTPUT: Intake/Output      04/19 0701 - 04/20 0700 04/20 0701 - 04/21 0700   I.V. (mL/kg) 1136.2 (12.1) 20 (0.2)   Other     NG/GT 30    IV Piggyback 700    Total Intake(mL/kg) 1866.2 (19.9) 20 (0.2)   Urine (mL/kg/hr) 640 (0.3) 150 (1)   Emesis/NG output     Total Output 640 150   Net +1226.2 -130          PHYSICAL EXAMINATION: General: working w/ PT. Reports abd and back pain  Neuro:  Awake, oriented and no def HEENT:   PERRL Cardiovascular:  Tachy, reg Lungs: crackles both bases  Abdomen:  Distended, midline bandage in place, hypoactive bowel sounds  Musculoskeletal:  3 + LE edema  Skin:  Warm, spider angiomas noted on chest  LABS:  CBC  Recent Labs Lab 04/19/2014 0943 05/02/14 0415 05/03/14 0420  WBC 10.7* 14.2* 14.9*  HGB 9.7* 9.9* 8.4*  HCT 29.3* 31.8* 25.1*  PLT 132* 193 152   Coag's  Recent Labs Lab 04/29/14 0513 04/26/2014 0504  INR 1.50* 1.51*   BMET  Recent Labs Lab 05/02/14 2132 05/03/14 0420 05/03/14 1245  NA 129* 133* 131*  K 4.1 4.4 4.1  CL 101 103 101  CO2 23 24 25   BUN 22 26* 29*  CREATININE 1.51* 1.61* 1.69*  GLUCOSE 151* 160* 143*   Electrolytes  Recent Labs Lab 04/29/14 0513  05/02/14 2132 05/03/14 0420 05/03/14 1245  CALCIUM 7.4*  < > 6.9* 7.0* 7.2*  MG 1.9  --   --   --   --   < > = values in this interval not displayed. Sepsis Markers  Recent Labs Lab 04/29/14 0513 05/06/2014 0943 05/07/2014 1211  LATICACIDVEN 1.5 2.7* 3.5*   ABG  Recent Labs Lab 05/02/14 1200 05/02/14 2047 05/03/14 0445  PHART 7.423 7.465* 7.445  PCO2ART 34.8* 34.8* 36.6  PO2ART 110.0* 71.6* 90.9   Liver Enzymes  Recent Labs Lab 04/29/14 0513 04/17/2014  8338 05/02/14 0415  AST 33 36 42*  ALT 22 23 21   ALKPHOS 125* 146* 128*  BILITOT 3.2* 3.9* 3.7*  ALBUMIN 1.9* 2.0* 2.1*   Cardiac Enzymes No results for input(s): TROPONINI, PROBNP in the last 168 hours. Glucose No results for input(s): GLUCAP in the last 168 hours.  Imaging No results found.   CXR: R midlung and basilar opacities, specifically right mid-lung. increased vascular congestion on 4/18  ASSESSMENT / PLAN:  PULMONARY A: Intubated for OR procedure now with severe metabolic acidosis  H/o COPD ??PNA Extubated 4/19.  P:   F/u cxr later today. Currently suspect abd pain and ascites will result in low volume so think evaluating after Para will be more useful  Aggressive pulm hygiene:  mobilize, start IS   CARDIOVASCULAR A: Sepsis/septic shock Probably some degree of low oncotic pressure related hypotension given low protein stores, hepatic disease  P:   Keep euvolemic  NE to keep MAPS >65 prn ABX as above Monitoring CVP (goal 8-12) Will need albumin with paracentesis  RENAL A: Severe metabolic acidosis (both +AG and NG component) suspect 2/2 increased lactate in setting of liver disease and possible SBO-->resolved  Chronic hyponatremia likely 2/2 liver dz Mild AKI. Likely in setting of third spacing and volume depletion  P:   Monitor UOP closely Monitor BMPs daily Keep euvolemic  Need f/u chem this am 4/20   GASTROINTESTINAL A: s/p ex-lap w/ lysis of adhesions.  No intrabdominal soilage noted. Abd pain, ascites. BP boarderline    P:   GI surgery following closely Defer diet to surg  Therapeutic paracentesis recommended by surgery. We will do this later this am  HEMATOLOGIC A: Anemia, likely of chronic disease.  No signs of active bleeding. 200cc EBL in OR Need for DVT prophy P:   Monitor CBC Transfuse for Hg<7 Lovenox for DVT prophy  INFECTIOUS A: Possible SBO with concern for perforation.  Although, no e/o this in OR Possible HCAP (vs atx) P:   CULTURES: Blood cx x 2 from 4/17:>>> Paracentesis fluid cx from 4/15>>>  ANTIBIOTICS: Primaxin:  04/29/2014>>> Vanc: 05/13/2014>>>4/20 Will d/c vanc today 4/20. No evidence of MRSA from any culture data  ENDOCRINE A: No acute issues P:   Trend fasting glucose   NEUROLOGIC A: Post-op pain  P:   Prn fentanyl for pain   TODAY'S SUMMARY: Taken to OR for concern for pneumatosis.  Ex-lap remarkable for ascites and a few adhesions but no e/o perf.  Remained intubated postop.  Metabolic acidosis improved. Off pressors. Extubated 4/19. Defer diet to surgery. Will mobilize. Stop vanc. Has slight bump in creatinine 4/19. Will maintain euvolemia. Needs para for pain, and repeat lab work  Erick Colace ACNP-BC [place] Pager # [phone] OR # [phone] if no answer 05/04/2014, 8:39 AM   Attending Note:  I have examined patient, reviewed labs, studies and notes. I have discussed the case with Jerrye Bushy, and I agree with the data and plans as amended above. Hx cirrhosis, now s/p lysis of adhesions. Course c/b by acute on chronic renal injury, metabolic acidosis, reaccumulation of ascites. He is at risk for dehiscence and will need paracentesis today. will plan to give albumin to avoid drastic fluid shifts and hypotension. Follow INR, renal fxn. Otherwise will attempt to mobilize and push post-op PT and recovery. Will check CXR after abd has been decompressed, eval atx vs PNA.   Baltazar Apo, MD, PhD 05/04/2014, 9:21 AM [facility]  [phone] or if no answer [phone]

## 2014-05-04 NOTE — Progress Notes (Signed)
ANTIBIOTIC CONSULT NOTE - FOLLOW UP  Pharmacy Consult for vancomycin Indication: HCAP  Allergies  Allergen Reactions  . Pulmicort [Budesonide] Shortness Of Breath    Causes stridor  . Penicillins Other (See Comments)    Rash hives, white spots.    Patient Measurements: Height: 5\' 8"  (172.7 cm) Weight: 194 lb 3.6 oz (88.1 kg) IBW/kg (Calculated) : 68.4 Adjusted Body Weight:   Vital Signs: Temp: 98.1 F (36.7 C) (04/20 0000) Temp Source: Oral (04/20 0000) Pulse Rate: 101 (04/20 0000) Intake/Output from previous day: 04/19 0701 - 04/20 0700 In: 1666.2 [I.V.:1036.2; NG/GT:30; IV Piggyback:600] Out: 440 [Urine:440] Intake/Output from this shift: Total I/O In: 420 [I.V.:120; IV Piggyback:300] Out: 175 [Urine:175]  Labs:  Recent Labs  04/20/2014 0943 05/02/14 0415  05/02/14 2132 05/03/14 0420 05/03/14 1245  WBC 10.7* 14.2*  --   --  14.9*  --   HGB 9.7* 9.9*  --   --  8.4*  --   PLT 132* 193  --   --  152  --   CREATININE  --  0.98  < > 1.51* 1.61* 1.69*  < > = values in this interval not displayed. Estimated Creatinine Clearance: 44.5 mL/min (by C-G formula based on Cr of 1.69).  Recent Labs  05/03/14 2115  [place] 35.9*     Microbiology: Recent Results (from the past 720 hour(s))  Body fluid culture     Status: None (Preliminary result)   Collection Time: 04/29/14  9:49 AM  Result Value Ref Range Status   Specimen Description ASCITIC  Final   Special Requests NONE  Final   Gram Stain   Final    RARE WBC PRESENT, PREDOMINANTLY MONONUCLEAR NO ORGANISMS SEEN Performed at [facility]    Culture   Final    NO GROWTH 2 DAYS Performed at [facility]    Report Status PENDING  Incomplete  Culture, blood (routine x 2)     Status: None (Preliminary result)   Collection Time: 04/16/2014 12:11 PM  Result Value Ref Range Status   Specimen Description BLOOD LEFT ARM  Final   Special Requests   Final    BOTTLES DRAWN AEROBIC AND ANAEROBIC 10CC  BOTH BOTTLES   Culture   Final           BLOOD CULTURE RECEIVED NO GROWTH TO DATE CULTURE WILL BE HELD FOR 5 DAYS BEFORE ISSUING A FINAL NEGATIVE REPORT Performed at [facility]    Report Status PENDING  Incomplete  Culture, blood (routine x 2)     Status: None (Preliminary result)   Collection Time: 04/16/2014 12:22 PM  Result Value Ref Range Status   Specimen Description BLOOD LEFT HAND  Final   Special Requests   Final    BOTTLES DRAWN AEROBIC AND ANAEROBIC BAA 10CC BOTH BOTTLES   Culture   Final           BLOOD CULTURE RECEIVED NO GROWTH TO DATE CULTURE WILL BE HELD FOR 5 DAYS BEFORE ISSUING A FINAL NEGATIVE REPORT Performed at [facility]    Report Status PENDING  Incomplete  Urine culture     Status: None   Collection Time: 05/02/14  5:00 AM  Result Value Ref Range Status   Specimen Description URINE, CLEAN CATCH  Final   Special Requests NONE  Final   Colony Count NO GROWTH Performed at [facility]   Final   Culture NO GROWTH Performed at [facility]   Final  Report Status 05/03/2014 FINAL  Final  MRSA PCR Screening     Status: None   Collection Time: 05/03/14 11:20 AM  Result Value Ref Range Status   MRSA by PCR NEGATIVE NEGATIVE Final    Comment:        The GeneXpert MRSA Assay (FDA approved for NASAL specimens only), is one component of a comprehensive MRSA colonization surveillance program. It is not intended to diagnose MRSA infection nor to guide or monitor treatment for MRSA infections.     Anti-infectives    Start     Dose/Rate Route Frequency Ordered Stop   05/03/14 1400  imipenem-cilastatin (PRIMAXIN) 500 mg in sodium chloride 0.9 % 100 mL IVPB     500 mg 200 mL/hr over 30 Minutes Intravenous 3 times per day 05/03/14 1021     05/02/2014 2200  vancomycin (VANCOCIN) IVPB 1000 mg/200 mL premix  Status:  Discontinued     1,000 mg 200 mL/hr over 60 Minutes Intravenous Every 12 hours 05/05/2014 1020 05/04/14 0343    05/05/2014 1400  imipenem-cilastatin (PRIMAXIN) 500 mg in sodium chloride 0.9 % 100 mL IVPB  Status:  Discontinued     500 mg 200 mL/hr over 30 Minutes Intravenous 3 times per day 05/09/2014 0958 04/27/2014 1025   05/09/2014 1200  imipenem-cilastatin (PRIMAXIN) 500 mg in sodium chloride 0.9 % 100 mL IVPB  Status:  Discontinued     500 mg 200 mL/hr over 30 Minutes Intravenous 4 times per day 04/17/2014 1025 05/03/14 1021   05/04/2014 1100  vancomycin (VANCOCIN) 2,000 mg in sodium chloride 0.9 % 500 mL IVPB     2,000 mg 250 mL/hr over 120 Minutes Intravenous  Once 05/06/2014 1020 04/29/2014 1330   04/26/2014 2200  rifaximin (XIFAXAN) tablet 550 mg  Status:  Discontinued     550 mg Oral 2 times daily 04/18/2014 1937 04/26/2014 0834      Assessment: 4/19 PM vancomycin level drawn after vancomycin started, per EPIC times.  RN called, asked to finish drip.  Will consider 4/19 PM vancomycin level not trough.    Goal of Therapy:  Vancomycin trough level 15-20 mcg/ml  Plan:  Measure antibiotic drug levels at steady state Follow up culture results  D/C current vancomycin level. Recheck level at 1000 Will re-order vancomycin if needed  Nani Skillern [place] 05/04/2014,4:10 AM

## 2014-05-04 NOTE — Procedures (Signed)
Procedure: Paracentesis Indication: abd pain and decrease ascites fluid  After informed consent and time out w/ Nurse the patient was placed in the left side-lying position. Using real time [place] the abd was examined and peritoneal fluid was identified. The site was marked, the patient was then prepped w/ chlorahexidine and sterile drape was applied. Using sterile technique the pt was then localized w/ 1% lidocaine and peritoneal fluid was aspirated with the [place] needle. A small incision was made. Then catheter was advanced slowly over guide needle, after aspiration of peritoneal fluid again observed. The needle was then retracted and peritoneal fluid was aspirated.   A total of 2.5 liters of serous sanguinous peritoneal fluid was aspirated.  The pt did develop some hypotension at the completion of liter #2. W/ SBP as low as 79. This responded to 25 gms of Albumin nicely.  Pt reported abd pain improved at completion of procedure and tolerated well.   Erick Colace ACNP-BC [place] Pager # [phone] OR # [phone] if no answer    Baltazar Apo, MD, PhD 05/05/2014, 11:30 AM [facility] [phone] or if no answer [phone]

## 2014-05-05 ENCOUNTER — Inpatient Hospital Stay (HOSPITAL_COMMUNITY): Payer: Medicare Other

## 2014-05-05 DIAGNOSIS — K567 Ileus, unspecified: Secondary | ICD-10-CM

## 2014-05-05 LAB — CBC
HCT: 25.8 % — ABNORMAL LOW (ref 39.0–52.0)
HCT: 26.7 % — ABNORMAL LOW (ref 39.0–52.0)
HEMOGLOBIN: 8.7 g/dL — AB (ref 13.0–17.0)
Hemoglobin: 8.5 g/dL — ABNORMAL LOW (ref 13.0–17.0)
MCH: 31.5 pg (ref 26.0–34.0)
MCH: 31.3 pg (ref 26.0–34.0)
MCHC: 32.9 g/dL (ref 30.0–36.0)
MCHC: 32.6 g/dL (ref 30.0–36.0)
MCV: 95.6 fL (ref 78.0–100.0)
MCV: 96 fL (ref 78.0–100.0)
PLATELETS: 115 10*3/uL — AB (ref 150–400)
PLATELETS: 114 10*3/uL — AB (ref 150–400)
RBC: 2.7 MIL/uL — AB (ref 4.22–5.81)
RBC: 2.78 MIL/uL — ABNORMAL LOW (ref 4.22–5.81)
RDW: 18.8 % — ABNORMAL HIGH (ref 11.5–15.5)
RDW: 18.8 % — ABNORMAL HIGH (ref 11.5–15.5)
WBC: 12 10*3/uL — AB (ref 4.0–10.5)
WBC: 11.1 10*3/uL — ABNORMAL HIGH (ref 4.0–10.5)

## 2014-05-05 LAB — BASIC METABOLIC PANEL
Anion gap: 5 (ref 5–15)
BUN: 31 mg/dL — AB (ref 6–23)
CO2: 24 mmol/L (ref 19–32)
CREATININE: 1.06 mg/dL (ref 0.50–1.35)
Calcium: 7.8 mg/dL — ABNORMAL LOW (ref 8.4–10.5)
Chloride: 105 mmol/L (ref 96–112)
GFR calc non Af Amer: 70 mL/min — ABNORMAL LOW (ref >90)
GFR, EST AFRICAN AMERICAN: 81 mL/min — AB (ref >90)
Glucose, Bld: 117 mg/dL — ABNORMAL HIGH (ref 70–99)
Potassium: 3.7 mmol/L (ref 3.5–5.1)
Sodium: 134 mmol/L — ABNORMAL LOW (ref 135–145)

## 2014-05-05 MED ORDER — MORPHINE SULFATE 2 MG/ML IJ SOLN
2.0000 mg | INTRAMUSCULAR | Status: DC | PRN
  Administered 2014-05-05 (×4): 2 mg via INTRAVENOUS
  Administered 2014-05-05: 4 mg via INTRAVENOUS
  Administered 2014-05-06: 2 mg via INTRAVENOUS
  Administered 2014-05-06 (×4): 4 mg via INTRAVENOUS
  Administered 2014-05-07: 6 mg via INTRAVENOUS
  Filled 2014-05-05: qty 2
  Filled 2014-05-05: qty 3
  Filled 2014-05-05 (×3): qty 1
  Filled 2014-05-05 (×3): qty 2
  Filled 2014-05-05 (×2): qty 1
  Filled 2014-05-05: qty 2

## 2014-05-05 NOTE — Progress Notes (Signed)
4 Days Post-Op  Subjective: Vomited yesterday after paracentesis, and low BP.  He had some BS yesterday, but I don't hear any this AM.  Wound is OK, not really having pain, no flatus.  Objective: Vital signs in last 24 hours: Temp:  [97.7 F (36.5 C)-98.3 F (36.8 C)] 98.3 F (36.8 C) (04/21 0700) Pulse Rate:  [97-110] 103 (04/21 0700) Resp:  [12-23] 12 (04/21 0700) BP: (97-142)/(44-116) 97/58 mmHg (04/21 0700) SpO2:  [87 %-100 %] 100 % (04/21 0700) Last BM Date: 05/04/14 Vomited x 2 yesterday,  Diet: clears Afebrile, BP down some this AM, HR remains in the 100 range NA 134, WBC 12K, H/H is stable Film shows SB distension,  NG placed and 600 from stomach since inserted last PM. Intake/Output from previous day: 04/20 0701 - 04/21 0700 In: 550 [I.V.:250; IV Piggyback:300] Out: 1165 [Urine:1165] Intake/Output this shift:    General appearance: alert, cooperative, no distress and uncomfortable with everything going on. GI: somewhat distended, no bowel sounds, no flatus, he had a small BM yesterday, but nothing since.  600 from NG since placed last PM.    Lab Results:   Recent Labs  05/03/14 0420 05/05/14 0543  WBC 14.9* 12.0*  HGB 8.4* 8.5*  HCT 25.1* 25.8*  PLT 152 115*    BMET  Recent Labs  05/04/14 1018 05/05/14 0543  NA 135 134*  K 4.1 3.7  CL 103 105  CO2 24 24  GLUCOSE 101* 117*  BUN 34* 31*  CREATININE 1.34 1.06  CALCIUM 7.4* 7.8*   PT/INR No results for input(s): LABPROT, INR in the last 72 hours.   Recent Labs Lab 04/29/14 0513 04/22/2014 0504 05/02/14 0415  AST 33 36 42*  ALT 22 23 21   ALKPHOS 125* 146* 128*  BILITOT 3.2* 3.9* 3.7*  PROT 5.0* 5.4* 5.4*  ALBUMIN 1.9* 2.0* 2.1*     Lipase     Component Value Date/Time   LIPASE 101* 04/25/2014 1222     Studies/Results: Dg Abd 1 View  05/05/2014   CLINICAL DATA:  NG tube placement.  EXAM: ABDOMEN - 1 VIEW  COMPARISON:  04/16/2014 CT  FINDINGS: The periphery of the right hemi abdomen  and lower pelvis are excluded from the field-of-view. Mild lung base opacities. NG tube tip projects over the stomach. Gaseous distention of small-bowel loops, measuring up to 6 cm. Surgical clips project over the right upper pelvis. Osteopenia and multilevel degenerative changes. Spine radiograph has limited sensitivity for free intraperitoneal air detection.  IMPRESSION: NG tube tip projects over the stomach.  Gaseous distension of small bowel loops up to 6 cm may reflect ileus or obstruction.   Electronically Signed   By: Clifford Cook M.D.   On: 05/05/2014 05:17    Medications: . antiseptic oral rinse  7 mL Mouth Rinse QID  . bisacodyl  10 mg Rectal Daily  . chlorhexidine  15 mL Mouth Rinse BID  . enoxaparin (LOVENOX) injection  40 mg Subcutaneous Q24H  . imipenem-cilastatin  500 mg Intravenous 3 times per day  . lip balm  1 application Topical BID  . pantoprazole (PROTONIX) IV  40 mg Intravenous QHS  . sodium chloride  1,000 mL Intravenous Once    Assessment/Plan SBO with probable, pneumatosis. EXPLORATORY LAPAROTOMY, LAPAROSCOPIC LYSIS OF ADHESIONS, PARACENTESIS x 3L, 04/28/2014,Dr. Alwyn Pea Post op ileus vs recurrent SBO. 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  I will check a prealbumin, continue NG, and think we need to consider when to initiate TNA. He is also very deconditioned and will need OT and PT.     LOS: 8 days    Clifford Cook 05/05/2014

## 2014-05-05 NOTE — Progress Notes (Signed)
PULMONARY  / CRITICAL CARE MEDICINE CONSULTATION   Name: Clifford Cook MRN: [id number] DOB: 1945/11/30    ADMISSION DATE:  05/09/2014 CONSULTATION DATE: 05/02/14  REQUESTING CLINICIAN: Dr. Johney Maine PRIMARY SERVICE: Surgery  CHIEF COMPLAINT:  Small bowel obstruction  BRIEF PATIENT DESCRIPTION:   1yom with PMH of NASH cirrhosis, CKD III, COPD presented on 05/09/2014 with worsening Abd distention and elevated ammonia.  He developed pneumatosis on Abd imaging prompting an emergent ex-lap on 4/17.  We were consulted for ICU comanagement.  SIGNIFICANT EVENTS / STUDIES:  05/09/2014:  Admitted with worsening ascites and abd distention 04/29/14:  MRI L spine with multiple subacute compression fx's 04/15/2014:  Developed ileus vs SBO with vomiting.  CT Abd/ pelvis concerning for pneumatosis.  Taken to OR for exlap.  Transferred to ICU. Had lysis of adhesions, but no evidence of spillage  4/18: on pressors, still w/ metabolic acidosis.  4/19 extubated.   LINES / TUBES: RIJ TLC:  05/07/2014--> Foley:  04/22/2014-->  SUBJECTIVE:  S/p para 4/2- NGT [placed overnight, no BM or flatus Back pain not controlled for long w fentanyl  VITAL SIGNS: Temp:  [97.7 F (36.5 C)-98.3 F (36.8 C)] 97.9 F (36.6 C) (04/21 1100) Pulse Rate:  [97-110] 107 (04/21 1000) Resp:  [12-23] 20 (04/21 1000) BP: (97-142)/(54-87) 114/55 mmHg (04/21 1000) SpO2:  [87 %-100 %] 97 % (04/21 1000) HEMODYNAMICS: CVP:  [5 mmHg-21 mmHg] 10 mmHg VENTILATOR SETTINGS:   INTAKE / OUTPUT: Intake/Output      04/20 0701 - 04/21 0700 04/21 0701 - 04/22 0700   I.V. (mL/kg) 250 (2.7) 30 (0.3)   NG/GT  80   IV Piggyback 300    Total Intake(mL/kg) 550 (5.9) 110 (1.2)   Urine (mL/kg/hr) 1165 (0.5) 225 (0.5)   Emesis/NG output 0 (0) 600 (1.4)   Total Output 1165 825   Net -615 -715        Emesis Occurrence 2 x      PHYSICAL EXAMINATION: General: ill appearing man, NAD Neuro:  Awake, oriented and non focal  HEENT:  PERRL Cardiovascular:   Regular no M Lungs: crackles both bases  Abdomen:  Distended, midline bandage in place, hypoactive bowel sounds  Musculoskeletal:  3 + LE edema  Skin:  Warm, spider angiomas noted on chest  LABS:  CBC  Recent Labs Lab 05/02/14 0415 05/03/14 0420 05/05/14 0543  WBC 14.2* 14.9* 12.0*  HGB 9.9* 8.4* 8.5*  HCT 31.8* 25.1* 25.8*  PLT 193 152 115*   Coag's  Recent Labs Lab 04/29/14 0513 04/19/2014 0504  INR 1.50* 1.51*   BMET  Recent Labs Lab 05/03/14 1245 05/04/14 1018 05/05/14 0543  NA 131* 135 134*  K 4.1 4.1 3.7  CL 101 103 105  CO2 25 24 24   BUN 29* 34* 31*  CREATININE 1.69* 1.34 1.06  GLUCOSE 143* 101* 117*   Electrolytes  Recent Labs Lab 04/29/14 0513  05/03/14 1245 05/04/14 1018 05/05/14 0543  CALCIUM 7.4*  < > 7.2* 7.4* 7.8*  MG 1.9  --   --   --   --   < > = values in this interval not displayed. Sepsis Markers  Recent Labs Lab 04/29/14 0513 04/19/2014 0943 04/30/2014 1211  LATICACIDVEN 1.5 2.7* 3.5*   ABG  Recent Labs Lab 05/02/14 1200 05/02/14 2047 05/03/14 0445  PHART 7.423 7.465* 7.445  PCO2ART 34.8* 34.8* 36.6  PO2ART 110.0* 71.6* 90.9   Liver Enzymes  Recent Labs Lab 04/29/14 0513 04/24/2014 0504 05/02/14 0415  AST  33 36 42*  ALT 22 23 21   ALKPHOS 125* 146* 128*  BILITOT 3.2* 3.9* 3.7*  ALBUMIN 1.9* 2.0* 2.1*   Cardiac Enzymes No results for input(s): TROPONINI, PROBNP in the last 168 hours. Glucose No results for input(s): GLUCAP in the last 168 hours.  Imaging Dg Abd 1 View  05/05/2014   CLINICAL DATA:  NG tube placement.  EXAM: ABDOMEN - 1 VIEW  COMPARISON:  04/30/2014 CT  FINDINGS: The periphery of the right hemi abdomen and lower pelvis are excluded from the field-of-view. Mild lung base opacities. NG tube tip projects over the stomach. Gaseous distention of small-bowel loops, measuring up to 6 cm. Surgical clips project over the right upper pelvis. Osteopenia and multilevel degenerative changes. Spine radiograph has  limited sensitivity for free intraperitoneal air detection.  IMPRESSION: NG tube tip projects over the stomach.  Gaseous distension of small bowel loops up to 6 cm may reflect ileus or obstruction.   Electronically Signed   By: Carlos Levering M.D.   On: 05/05/2014 05:17     CXR: R midlung and basilar opacities, specifically right mid-lung. increased vascular congestion on 4/18  ASSESSMENT / PLAN:  PULMONARY A: Intubated for OR procedure now with severe metabolic acidosis  H/o COPD ??PNA Extubated 4/19.  P:   F/u cxr 4/21. Currently suspect abd pain and ascites will result in low volume so think evaluating after Para will be more useful  Aggressive pulm hygiene: mobilize, IS, OOB  CARDIOVASCULAR A: Sepsis/septic shock Probably some degree of low oncotic pressure related hypotension given low protein stores, hepatic disease  P:   Keep euvolemic  Pressors to off ABX as above Monitoring CVP (goal 8-12)  RENAL A: Severe metabolic acidosis (both +AG and NG component) suspect 2/2 increased lactate in setting of liver disease and possible SBO-->resolved  Chronic hyponatremia likely 2/2 liver dz Mild AKI. Likely in setting of third spacing and volume depletion, resolved P:   Monitor UOP closely Monitor BMPs daily Keep euvolemic   GASTROINTESTINAL A: s/p ex-lap w/ lysis of adhesions.  No intrabdominal soilage noted. Abd pain, ascites. BP boarderline    P:   GI surgery following closely NGT placed, more conservative w POs until having BM  HEMATOLOGIC A: Anemia, likely of chronic disease.  No signs of active bleeding. 200cc EBL in OR Need for DVT prophy P:   Monitor CBC Transfuse for Hg<7 Lovenox for DVT prophy  INFECTIOUS A: Possible SBO; no evidence perforation in the OR  Possible HCAP (vs atx) P:   CULTURES: Blood cx x 2 from 4/17:>>> Paracentesis fluid cx from 4/15>>>  ANTIBIOTICS: Primaxin:  05/11/2014>>> Vanc: 05/05/2014>>>4/20 d/c'd vanc 4/20. No evidence of  MRSA from any culture data  ENDOCRINE A: No acute issues P:   Trend fasting glucose   NEUROLOGIC A: Post-op pain  Back pain due to compression fractures.  P:   Change fentanyl to higher dose morphine so it will last longer Would like to have IR eval him for kyphoplasty before discharge if OK with surgery (would need to be prone)   TODAY'S SUMMARY: Taken to OR for concern for pneumatosis.  Ex-lap remarkable for ascites and a few adhesions but no e/o perf.  Extubated. Metabolic acidosis, renal fxn improved. NGT in place. Needs batter pain control, will try to be careful w narcs as he has not yet had BM.     Baltazar Apo, MD, PhD 05/05/2014, 11:32 AM [facility] [phone] or if no answer [phone]

## 2014-05-05 NOTE — Progress Notes (Signed)
CSW met with pt / spouse / daughter to assist with d/c planning. At this time, pt / family are planning for d/c home with [facility] services. Spouse is home with pt 24/7 and daughter assists as much as possible. Pt has been to SNF in the past and the experience was not positive. Family has CSW cell # and have been encouraged to call if their plans change.  Werner Lean LCSW [phone]

## 2014-05-06 DIAGNOSIS — J9811 Atelectasis: Secondary | ICD-10-CM

## 2014-05-06 LAB — TYPE AND SCREEN
ABO/RH(D): O NEG
Antibody Screen: NEGATIVE
UNIT DIVISION: 0

## 2014-05-06 LAB — AMMONIA: AMMONIA: 32 umol/L (ref 11–32)

## 2014-05-06 LAB — BASIC METABOLIC PANEL
Anion gap: 7 (ref 5–15)
BUN: 28 mg/dL — AB (ref 6–23)
CO2: 25 mmol/L (ref 19–32)
CREATININE: 1.05 mg/dL (ref 0.50–1.35)
Calcium: 7.8 mg/dL — ABNORMAL LOW (ref 8.4–10.5)
Chloride: 106 mmol/L (ref 96–112)
GFR calc non Af Amer: 70 mL/min — ABNORMAL LOW (ref >90)
GFR, EST AFRICAN AMERICAN: 82 mL/min — AB (ref >90)
Glucose, Bld: 91 mg/dL (ref 70–99)
Potassium: 3.8 mmol/L (ref 3.5–5.1)
Sodium: 138 mmol/L (ref 135–145)

## 2014-05-06 MED ORDER — POTASSIUM CHLORIDE 10 MEQ/100ML IV SOLN
10.0000 meq | INTRAVENOUS | Status: AC
  Administered 2014-05-06 (×4): 10 meq via INTRAVENOUS
  Filled 2014-05-06 (×4): qty 100

## 2014-05-06 NOTE — Progress Notes (Signed)
PULMONARY  / CRITICAL CARE MEDICINE CONSULTATION   Name: Clifford Cook MRN: [id number] DOB: 11-05-1945    ADMISSION DATE:  04/16/2014 CONSULTATION DATE: 05/02/14  REQUESTING CLINICIAN: Dr. Johney Maine PRIMARY SERVICE: Surgery  CHIEF COMPLAINT:  Small bowel obstruction  BRIEF PATIENT DESCRIPTION:   66yom with PMH of NASH cirrhosis, CKD III, COPD presented on 04/23/2014 with worsening Abd distention and elevated ammonia.  He developed pneumatosis on Abd imaging prompting an emergent ex-lap on 4/17.  We were consulted for ICU comanagement.  SIGNIFICANT EVENTS / STUDIES:  04/28/2014:  Admitted with worsening ascites and abd distention 04/29/14:  MRI L spine with multiple subacute compression fx's 04/26/2014:  Developed ileus vs SBO with vomiting.  CT Abd/ pelvis concerning for pneumatosis.  Taken to OR for exlap.  Transferred to ICU. Had lysis of adhesions, but no evidence of spillage  4/18: on pressors, still w/ metabolic acidosis.  4/19 extubated.   LINES / TUBES: RIJ TLC:  05/12/2014--> Foley:  05/03/2014-->  SUBJECTIVE:  Bowels quiet. Abd distended. Having pain in abd and back.   VITAL SIGNS: Temp:  [97.9 F (36.6 C)-98.3 F (36.8 C)] 98.3 F (36.8 C) (04/21 2345) Pulse Rate:  [100-108] 100 (04/22 0400) Resp:  [10-21] 14 (04/22 0400) BP: (103-122)/(50-62) 106/57 mmHg (04/22 0400) SpO2:  [94 %-99 %] 95 % (04/22 0400)  4 liters HEMODYNAMICS:   VENTILATOR SETTINGS:   INTAKE / OUTPUT: Intake/Output      04/21 0701 - 04/22 0700 04/22 0701 - 04/23 0700   I.V. (mL/kg) 240 (2.6)    NG/GT 110    IV Piggyback 200    Total Intake(mL/kg) 550 (5.9)    Urine (mL/kg/hr) 967 (0.4)    Emesis/NG output 1800 (0.8)    Total Output 2767     Net -2217            PHYSICAL EXAMINATION: General: ill appearing man, NAD Neuro:  Awake, oriented and non focal  HEENT:  PERRL Cardiovascular:  Regular no M Lungs: crackles both bases  Abdomen:  Distended, midline bandage in place, hypoactive bowel sounds.  Tympanic percussion   Musculoskeletal:  3 + LE edema  Skin:  Warm, spider angiomas noted on chest  LABS:  CBC  Recent Labs Lab 05/03/14 0420 05/05/14 0543 05/05/14 1302  WBC 14.9* 12.0* 11.1*  HGB 8.4* 8.5* 8.7*  HCT 25.1* 25.8* 26.7*  PLT 152 115* 114*   Coag's  Recent Labs Lab 05/03/2014 0504  INR 1.51*   BMET  Recent Labs Lab 05/04/14 1018 05/05/14 0543 05/06/14 0526  NA 135 134* 138  K 4.1 3.7 3.8  CL 103 105 106  CO2 24 24 25   BUN 34* 31* 28*  CREATININE 1.34 1.06 1.05  GLUCOSE 101* 117* 91   Electrolytes  Recent Labs Lab 05/04/14 1018 05/05/14 0543 05/06/14 0526  CALCIUM 7.4* 7.8* 7.8*   Sepsis Markers  Recent Labs Lab 04/23/2014 0943 05/04/2014 1211  LATICACIDVEN 2.7* 3.5*   ABG  Recent Labs Lab 05/02/14 1200 05/02/14 2047 05/03/14 0445  PHART 7.423 7.465* 7.445  PCO2ART 34.8* 34.8* 36.6  PO2ART 110.0* 71.6* 90.9   Liver Enzymes  Recent Labs Lab 05/11/2014 0504 05/02/14 0415  AST 36 42*  ALT 23 21  ALKPHOS 146* 128*  BILITOT 3.9* 3.7*  ALBUMIN 2.0* 2.1*   Cardiac Enzymes No results for input(s): TROPONINI, PROBNP in the last 168 hours. Glucose No results for input(s): GLUCAP in the last 168 hours.  Imaging Dg Abd 1 View  05/05/2014  CLINICAL DATA:  NG tube placement.  EXAM: ABDOMEN - 1 VIEW  COMPARISON:  04/21/2014 CT  FINDINGS: The periphery of the right hemi abdomen and lower pelvis are excluded from the field-of-view. Mild lung base opacities. NG tube tip projects over the stomach. Gaseous distention of small-bowel loops, measuring up to 6 cm. Surgical clips project over the right upper pelvis. Osteopenia and multilevel degenerative changes. Spine radiograph has limited sensitivity for free intraperitoneal air detection.  IMPRESSION: NG tube tip projects over the stomach.  Gaseous distension of small bowel loops up to 6 cm may reflect ileus or obstruction.   Electronically Signed   By: Clifford Cook M.D.   On: 05/05/2014  05:17   Dg Chest Port 1 View  05/05/2014   CLINICAL DATA:  Shortness of Breath  EXAM: PORTABLE CHEST - 1 VIEW  COMPARISON:  05/02/2014  FINDINGS: Cardiac shadow is mildly enlarged but stable. The right jugular central line is now repositioned within the mid superior vena cava. A nasogastric catheter is seen within the stomach. The endotracheal tube has been removed. Patchy infiltrates are seen in the right upper and left upper lobes. The left upper lobe changes are new from the prior study. The right upper lobe changes are relatively stable.  IMPRESSION: Interval repositioning of right jugular catheter.  Endotracheal tube removal.  Bilateral patchy infiltrates right greater than left.   Electronically Signed   By: Clifford Cook M.D.   On: 05/05/2014 12:27     CXR: R midlung and basilar opacities, specifically right mid-lung. increased vascular congestion on 4/18  ASSESSMENT / PLAN:  PULMONARY A: Intubated for OR procedure, remained ventilated for metabolic acidosis  H/o COPD ??PNA Atelectasis due to decreased abd compliance  Extubated 4/19.  P:   Aggressive pulm hygiene: mobilize, IS, OOB   CARDIOVASCULAR A: Sepsis/septic shock Probably some degree of low oncotic pressure related hypotension given low protein stores, hepatic disease Off pressors   P:   Keep euvolemic   RENAL A: Severe metabolic acidosis (both +AG and NG component) suspect 2/2 increased lactate in setting of liver disease and possible SBO-->resolved  Chronic hyponatremia likely 2/2 liver dz Mild AKI. Likely in setting of third spacing and volume depletion, resolved P:   Monitor UOP closely Monitor BMPs daily Keep euvolemic   GASTROINTESTINAL A: s/p ex-lap w/ lysis of adhesions.  No intrabdominal soilage noted. Abd pain, ascites. BP boarderline   Ileus: surgery is concerned that it may take some time for his bowels to wake up P:   Cont mobility efforts Reluctant to start TNA but may not have many options.  WIll d/w Dr Lamonte Sakai  Suspect he will need repeat paracentesis in near future  HEMATOLOGIC A: Anemia, likely of chronic disease.  No signs of active bleeding. 200cc EBL in OR Need for DVT prophy P:   Monitor CBC Transfuse for Hg<7 Lovenox for DVT prophy  INFECTIOUS A: Possible SBO; no evidence perforation in the OR  Possible HCAP (vs atx) P:   CULTURES: Blood cx x 2 from 4/17:>>> Paracentesis fluid cx from 4/15>>>neg  ANTIBIOTICS: Primaxin:  05/14/2014>>> Vanc: 05/08/2014>>>4/20 d/c'd vanc 4/20. No evidence of MRSA from any culture data  ENDOCRINE A: No acute issues P:   Trend fasting glucose   NEUROLOGIC A: Post-op pain  Back pain due to compression fractures.  P:   PRN morphine  IR to eval him for kyphoplasty-->surgery says ok from their stand-point    TODAY'S SUMMARY:  Taken to OR for concern for  pneumatosis.  Ex-lap remarkable for ascites and a few adhesions but no e/o perf.  Extubated. Metabolic acidosis, renal fxn improved. NGT in place. Ileus major obstacle. Off pressors. Pain better w/ morphine but doubt it's helping much w/ the ileus. If he can tolerate the kyphoplasty then suspect it would help his pain.  Given his abd distention doubt he is ready for this yet.   Clifford Cook ACNP-BC [place] Pager # [phone] OR # [phone] if no answer  Attending Note:  I have examined patient, reviewed labs, studies and notes. I have discussed the case with Clifford Cook, and I agree with the data and plans as amended above.   Baltazar Apo, MD, PhD 05/06/2014, 11:41 AM [place] [facility] [phone] or if no answer [phone]

## 2014-05-06 NOTE — Progress Notes (Signed)
Physical Therapy Treatment Patient Details Name: Clifford Cook MRN: [id number] DOB: 1945/10/16 Today's Date: 05/06/2014    History of Present Illness 4yom with PMH of NASH cirrhosis, CKD III, COPD presented on 04/20/2014 with worsening Abd distention and elevated ammonia. He developed pneumatosis on Abd imaging prompting an emergent ex-lap on 4/17. Being followed by IR for compression fractures of L 2,3,5. was for possible VP/KP prior to emrgent  surgery on abdomen.    PT Comments     Patient in recliner by nursing. Patient willing to work on standing at the [place]. Stas on 5 l=>90%, HR 115 range,.Note family desires DC to home, not SNF.   Follow Up Recommendations  SNF;Supervision/Assistance - 24 hour;Home health PT (whatever family desires.)     Equipment Recommendations  None recommended by PT    Recommendations for Other Services       Precautions / Restrictions Precautions Precautions: Fall Precaution Comments: midline abdominal incision, needs premeds, NG suction,  Required Braces or Orthoses: Other Brace/Splint Other Brace/Splint: abdominal binder, did not see one in room,     Mobility  Bed Mobility               General bed mobility comments: in recliner  Transfers Overall transfer level: Needs assistance Equipment used: Rolling walker (2 wheeled) Transfers: Sit to/from Stand Sit to Stand: Max assist;+2 physical assistance;+2 safety/equipment         General transfer comment: Assist to rise, stabilize, cotnrol descent. VCS safety, technique, hand placement  Ambulation/Gait             General Gait Details: patient stood x 8 minutes at RW, did not want to attempt to walk in place,   Stairs            Wheelchair Mobility    Modified Rankin (Stroke Patients Only)       Balance                                    Cognition Arousal/Alertness: Awake/alert Behavior During Therapy: Flat affect;Anxious                         Exercises      General Comments        Pertinent Vitals/Pain Pain Score: 6  Pain Location: back and abdomen Pain Descriptors / Indicators: Aching;Tightness    Home Living                      Prior Function            PT Goals (current goals can now be found in the care plan section) Progress towards PT goals: Progressing toward goals    Frequency  Min 3X/week    PT Plan Discharge plan needs to be updated (per MSW note- family does not want SNF. )    Co-evaluation             End of Session   Activity Tolerance: Patient tolerated treatment well Patient left: in chair;with call bell/phone within reach     Time: [id number] PT Time Calculation (min) (ACUTE ONLY): 15 min  Charges:  $Therapeutic Activity: 8-22 mins                    G Codes:      [name] 05/06/2014, 5:02 PM  Tresa Endo PT [phone]

## 2014-05-06 NOTE — Progress Notes (Signed)
5 Days Post-Op  Subjective: No real change, no flatus, abdomen distended, but doesn't seem excessive to him.  Few BS, up a couple hours to chair yesterday.  Objective: Vital signs in last 24 hours: Temp:  [97.9 F (36.6 C)-98.3 F (36.8 C)] 98.3 F (36.8 C) (04/21 2345) Pulse Rate:  [100-108] 100 (04/22 0400) Resp:  [10-21] 14 (04/22 0400) BP: (103-122)/(50-62) 106/57 mmHg (04/22 0400) SpO2:  [94 %-99 %] 95 % (04/22 0400) Last BM Date: 05/04/14 1800 from NG yesterday NPO Afebrile, tachycardic, BP is stable BMP stable; prealbumin pending Intake/Output from previous day: 04/21 0701 - 04/22 0700 In: 550 [I.V.:240; NG/GT:110; IV Piggyback:200] Out: 2767 [Urine:967; Emesis/NG output:1800] Intake/Output this shift:    General appearance: alert, cooperative and no distress Resp: wheezes bilaterally GI: distended, few BS, paracentesis and midline incision without any leakage.  .  Lab Results:   Recent Labs  05/05/14 0543 05/05/14 1302  WBC 12.0* 11.1*  HGB 8.5* 8.7*  HCT 25.8* 26.7*  PLT 115* 114*    BMET  Recent Labs  05/05/14 0543 05/06/14 0526  NA 134* 138  K 3.7 3.8  CL 105 106  CO2 24 25  GLUCOSE 117* 91  BUN 31* 28*  CREATININE 1.06 1.05  CALCIUM 7.8* 7.8*   PT/INR No results for input(s): LABPROT, INR in the last 72 hours.   Recent Labs Lab 05/13/2014 0504 05/02/14 0415  AST 36 42*  ALT 23 21  ALKPHOS 146* 128*  BILITOT 3.9* 3.7*  PROT 5.4* 5.4*  ALBUMIN 2.0* 2.1*     Lipase     Component Value Date/Time   LIPASE 101* 04/25/2014 1222     Studies/Results: Dg Abd 1 View  05/05/2014   CLINICAL DATA:  NG tube placement.  EXAM: ABDOMEN - 1 VIEW  COMPARISON:  04/27/2014 CT  FINDINGS: The periphery of the right hemi abdomen and lower pelvis are excluded from the field-of-view. Mild lung base opacities. NG tube tip projects over the stomach. Gaseous distention of small-bowel loops, measuring up to 6 cm. Surgical clips project over the right upper  pelvis. Osteopenia and multilevel degenerative changes. Spine radiograph has limited sensitivity for free intraperitoneal air detection.  IMPRESSION: NG tube tip projects over the stomach.  Gaseous distension of small bowel loops up to 6 cm may reflect ileus or obstruction.   Electronically Signed   By: Carlos Levering M.D.   On: 05/05/2014 05:17   Dg Chest Port 1 View  05/05/2014   CLINICAL DATA:  Shortness of Breath  EXAM: PORTABLE CHEST - 1 VIEW  COMPARISON:  05/02/2014  FINDINGS: Cardiac shadow is mildly enlarged but stable. The right jugular central line is now repositioned within the mid superior vena cava. A nasogastric catheter is seen within the stomach. The endotracheal tube has been removed. Patchy infiltrates are seen in the right upper and left upper lobes. The left upper lobe changes are new from the prior study. The right upper lobe changes are relatively stable.  IMPRESSION: Interval repositioning of right jugular catheter.  Endotracheal tube removal.  Bilateral patchy infiltrates right greater than left.   Electronically Signed   By: Inez Catalina M.D.   On: 05/05/2014 12:27    Medications: . antiseptic oral rinse  7 mL Mouth Rinse QID  . bisacodyl  10 mg Rectal Daily  . chlorhexidine  15 mL Mouth Rinse BID  . enoxaparin (LOVENOX) injection  40 mg Subcutaneous Q24H  . imipenem-cilastatin  500 mg Intravenous 3 times per day  .  lip balm  1 application Topical BID  . pantoprazole (PROTONIX) IV  40 mg Intravenous QHS  . sodium chloride  1,000 mL Intravenous Once   . lactated ringers 10 mL/hr at 05/05/14 1900    Assessment/Plan 1.  SBO with probable, pneumatosis. EXPLORATORY LAPAROTOMY, LAPAROSCOPIC LYSIS OF ADHESIONS, PARACENTESIS x 3L, 05/06/2014,Dr. Alwyn Pea 2.  Post op ileus vs recurrent SBO. 3.  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  Hx Elevated ammonia and encephalopathy 5.  RUL/RML infiltrate/HCAP  (extubated 05/03/14) 6.  Atrial fibrillation 7.  Hx of stage III renal disease 8.  Hx of respiratory failure 9.  Hyponatremia 10.  Chronic back pain  11.  DVT: Lovenox/SCD 12.  Day 5 Imipenem-cilastatin (3 days of Vancomycin completed 4/19,/4 days of rifaximin completed 4/16.) 13.  Deconditioning and malnutrition  -  Prealbumin pending.    Plan:  From our standpoint nothing to do but wait for ileus to resolve.  The more we can get him moving the better.  He is using IS about 1/hour.  I encouraged him to do 10-15 per hour.  Currently no nutrition plan in place.  If not contraindicated by his cirrhosis we would recommend TNA, will defer to CCM.      LOS: 9 days    Clifford Cook 05/06/2014

## 2014-05-06 NOTE — Progress Notes (Signed)
ANTIBIOTIC CONSULT NOTE - FOLLOW UP  Pharmacy Consult for Primaxin Indication: HCAP  Allergies  Allergen Reactions  . Pulmicort [Budesonide] Shortness Of Breath    Causes stridor  . Penicillins Other (See Comments)    Rash hives, white spots.    Patient Measurements: Height: 5\' 8"  (172.7 cm) Weight: 206 lb 12.7 oz (93.8 kg) IBW/kg (Calculated) : 68.4  Vital Signs: Temp: 97.9 F (36.6 C) (04/22 1200) Temp Source: Oral (04/22 1200) BP: 100/54 mmHg (04/22 0800) Pulse Rate: 100 (04/22 1000) Intake/Output from previous day: 04/21 0701 - 04/22 0700 In: 560 [I.V.:250; NG/GT:110; IV Piggyback:200] Out: 2767 [Urine:967; Emesis/NG output:1800]  Labs:  Recent Labs  05/04/14 1018 05/05/14 0543 05/05/14 1302 05/06/14 0526  WBC  --  12.0* 11.1*  --   HGB  --  8.5* 8.7*  --   PLT  --  115* 114*  --   CREATININE 1.34 1.06  --  1.05   Estimated Creatinine Clearance: 73.8 mL/min (by C-G formula based on Cr of 1.05).  Recent Labs  05/03/14 2115  VANCOTROUGH 35.9*      Assessment: 8 y/oM with PMH of NASH cirrhosis, anemia, thrombocytopenia, CKD stage III, Strep A PNA, COPD, hepatic encephalopathy who presented to [facility] ED on 4/13 with generalized weakness, abdominal distention, and lower back pain, found to have hyperammonemia and ascites.  Patient developed ileus and vomiting, so X-rays obtained. CXR 4/17 suggests RML/RUL infiltrate, and AXR suggests possible pSBO.  Pharmacy is consulted to renally adjust antibiotics.  4/17 >> Vancomycin >> 4/20 4/17 >> Primaxin >>    Today, 05/06/2014: Temp: afebrile since admission WBC: moderately elevated (no steroids) (4/21) Renal: new AKI resolved with SCr 1.05, CrCl 74 ml/min CG, 67 ml/minN Cultures (blood, urine, ascitic) unrevealing to date.   Goal of Therapy:  Appropriate abx dosing, eradication of infection.   Plan:   Continue Primaxin 500mg  IV q8h.  Follow up renal fxn, culture results, and clinical course.   Gretta Arab PharmD, BCPS Pager [phone] 05/06/2014 2:09 PM

## 2014-05-06 NOTE — Progress Notes (Signed)
eLink Physician-Brief Progress Note Patient Name: Clifford Cook DOB: [birth year] MRN: [id number]   Date of Service  05/06/2014  HPI/Events of Note  Daughter requests ammonia level be sent.   eICU Interventions  Will order an ammonia level now.      Intervention Category Minor Interventions: Clinical assessment - ordering diagnostic tests  Lysle Dingwall 05/06/2014, 8:29 PM

## 2014-05-07 ENCOUNTER — Inpatient Hospital Stay (HOSPITAL_COMMUNITY): Payer: Medicare Other

## 2014-05-07 DIAGNOSIS — Z79899 Other long term (current) drug therapy: Secondary | ICD-10-CM

## 2014-05-07 DIAGNOSIS — I2699 Other pulmonary embolism without acute cor pulmonale: Secondary | ICD-10-CM

## 2014-05-07 LAB — COMPREHENSIVE METABOLIC PANEL
ALBUMIN: 2.1 g/dL — AB (ref 3.5–5.2)
ALT: 18 U/L (ref 0–53)
ANION GAP: 7 (ref 5–15)
AST: 39 U/L — ABNORMAL HIGH (ref 0–37)
Alkaline Phosphatase: 107 U/L (ref 39–117)
BILIRUBIN TOTAL: 3.2 mg/dL — AB (ref 0.3–1.2)
BUN: 35 mg/dL — AB (ref 6–23)
CHLORIDE: 108 mmol/L (ref 96–112)
CO2: 25 mmol/L (ref 19–32)
Calcium: 7.9 mg/dL — ABNORMAL LOW (ref 8.4–10.5)
Creatinine, Ser: 1.34 mg/dL (ref 0.50–1.35)
GFR calc non Af Amer: 52 mL/min — ABNORMAL LOW (ref >90)
GFR, EST AFRICAN AMERICAN: 61 mL/min — AB (ref >90)
Glucose, Bld: 91 mg/dL (ref 70–99)
POTASSIUM: 4.3 mmol/L (ref 3.5–5.1)
Sodium: 140 mmol/L (ref 135–145)
Total Protein: 4.7 g/dL — ABNORMAL LOW (ref 6.0–8.3)

## 2014-05-07 LAB — PREALBUMIN: Prealbumin: <4 mg/dL — ABNORMAL LOW (ref 21–43)

## 2014-05-07 LAB — CULTURE, BLOOD (ROUTINE X 2)
CULTURE: NO GROWTH
Culture: NO GROWTH

## 2014-05-07 LAB — CBC
HCT: 26.9 % — ABNORMAL LOW (ref 39.0–52.0)
Hemoglobin: 8.5 g/dL — ABNORMAL LOW (ref 13.0–17.0)
MCH: 31.1 pg (ref 26.0–34.0)
MCHC: 31.6 g/dL (ref 30.0–36.0)
MCV: 98.5 fL (ref 78.0–100.0)
Platelets: 140 10*3/uL — ABNORMAL LOW (ref 150–400)
RBC: 2.73 MIL/uL — AB (ref 4.22–5.81)
RDW: 19.2 % — ABNORMAL HIGH (ref 11.5–15.5)
WBC: 12.1 10*3/uL — AB (ref 4.0–10.5)

## 2014-05-07 LAB — BASIC METABOLIC PANEL
Anion gap: 5 (ref 5–15)
BUN: 44 mg/dL — AB (ref 6–23)
CO2: 23 mmol/L (ref 19–32)
CREATININE: 1.74 mg/dL — AB (ref 0.50–1.35)
Calcium: 7.8 mg/dL — ABNORMAL LOW (ref 8.4–10.5)
Chloride: 112 mmol/L (ref 96–112)
GFR calc Af Amer: 44 mL/min — ABNORMAL LOW (ref >90)
GFR, EST NON AFRICAN AMERICAN: 38 mL/min — AB (ref >90)
GLUCOSE: 95 mg/dL (ref 70–99)
Potassium: 4.4 mmol/L (ref 3.5–5.1)
Sodium: 140 mmol/L (ref 135–145)

## 2014-05-07 LAB — GLUCOSE, CAPILLARY
Glucose-Capillary: 97 mg/dL (ref 70–99)
Glucose-Capillary: 85 mg/dL (ref 70–99)

## 2014-05-07 LAB — AMMONIA: Ammonia: 46 umol/L — ABNORMAL HIGH (ref 11–32)

## 2014-05-07 MED ORDER — VITAL HIGH PROTEIN PO LIQD
1000.0000 mL | ORAL | Status: DC
  Filled 2014-05-07 (×2): qty 1000

## 2014-05-07 MED ORDER — HALOPERIDOL LACTATE 5 MG/ML IJ SOLN
2.0000 mg | Freq: Four times a day (QID) | INTRAMUSCULAR | Status: DC | PRN
  Administered 2014-05-07 (×2): 2 mg via INTRAVENOUS
  Filled 2014-05-07 (×2): qty 1

## 2014-05-07 MED ORDER — VANCOMYCIN HCL IN DEXTROSE 750-5 MG/150ML-% IV SOLN
750.0000 mg | Freq: Two times a day (BID) | INTRAVENOUS | Status: DC
  Administered 2014-05-07 – 2014-05-08 (×2): 750 mg via INTRAVENOUS
  Filled 2014-05-07 (×3): qty 150

## 2014-05-07 MED ORDER — ALBUMIN HUMAN 25 % IV SOLN
25.0000 g | Freq: Once | INTRAVENOUS | Status: AC
  Administered 2014-05-07: 25 g via INTRAVENOUS
  Filled 2014-05-07: qty 100

## 2014-05-07 MED ORDER — SODIUM CHLORIDE 0.9 % IV BOLUS (SEPSIS)
500.0000 mL | Freq: Once | INTRAVENOUS | Status: AC
  Administered 2014-05-07: 500 mL via INTRAVENOUS

## 2014-05-07 MED ORDER — FUROSEMIDE 10 MG/ML IJ SOLN
40.0000 mg | Freq: Once | INTRAMUSCULAR | Status: AC
  Administered 2014-05-07: 40 mg via INTRAVENOUS
  Filled 2014-05-07: qty 4

## 2014-05-07 MED ORDER — MORPHINE SULFATE 2 MG/ML IJ SOLN
2.0000 mg | INTRAMUSCULAR | Status: DC | PRN
Start: 2014-05-07 — End: 2014-05-08
  Administered 2014-05-07 – 2014-05-08 (×5): 2 mg via INTRAVENOUS
  Filled 2014-05-07 (×6): qty 1

## 2014-05-07 MED ORDER — FUROSEMIDE 10 MG/ML IJ SOLN: 40.0000 mg | Freq: Three times a day (TID) | INTRAMUSCULAR | Status: DC

## 2014-05-07 MED ORDER — SODIUM CHLORIDE 0.9 % IV BOLUS (SEPSIS)
1000.0000 mL | Freq: Once | INTRAVENOUS | Status: AC
  Administered 2014-05-07: 1000 mL via INTRAVENOUS

## 2014-05-07 MED ORDER — MORPHINE SULFATE 2 MG/ML IJ SOLN
2.0000 mg | Freq: Once | INTRAMUSCULAR | Status: AC
  Administered 2014-05-07: 2 mg via INTRAVENOUS
  Filled 2014-05-07: qty 1

## 2014-05-07 MED ORDER — SODIUM CHLORIDE 0.9 % IV SOLN: INTRAVENOUS | Status: DC

## 2014-05-07 MED ORDER — DEXTROSE 5 % IV SOLN
0.0000 ug/min | INTRAVENOUS | Status: DC
  Administered 2014-05-07: 5 ug/min via INTRAVENOUS
  Filled 2014-05-07: qty 4

## 2014-05-07 NOTE — Progress Notes (Signed)
eLink Physician-Brief Progress Note Patient Name: Clifford Cook DOB: [birth year] MRN: [id number]   Date of Service  05/07/2014  HPI/Events of Note  Oliguria in the setting of KVO fluids, AKI, NASH cirrhosis.  Current BP of 94/69 (77)  eICU Interventions  Will give 500 cc NS bolus  Continue to monitor UOP     Intervention Category Intermediate Interventions: Oliguria - evaluation and management  DETERDING,ELIZABETH 05/07/2014, 3:14 AM

## 2014-05-07 NOTE — Progress Notes (Signed)
6 Days Post-Op  Subjective: Decreased uop overnight; increased O2 reqmt. No BM.   Objective: Vital signs in last 24 hours: Temp:  [97.5 F (36.4 C)-98.1 F (36.7 C)] 97.5 F (36.4 C) (04/22 2328) Pulse Rate:  [100-110] 104 (04/23 0825) Resp:  [8-24] 10 (04/23 0825) BP: (84-105)/(41-69) 96/41 mmHg (04/23 0802) SpO2:  [85 %-100 %] 94 % (04/23 0825) FiO2 (%):  [35 %-50 %] 50 % (04/23 0825) Last BM Date: 05/04/14  Intake/Output from previous day: 04/22 0701 - 04/23 0700 In: 840 [I.V.:240; IV Piggyback:600] Out: 402 [Urine:402] Intake/Output this shift:    Looks a little worse today Soft, distended with ascites; incision c/d/i; some BS  Lab Results:   Recent Labs  05/05/14 1302 05/07/14 0532  WBC 11.1* 12.1*  HGB 8.7* 8.5*  HCT 26.7* 26.9*  PLT 114* 140*   BMET  Recent Labs  05/06/14 0526 05/07/14 0532  NA 138 140  K 3.8 4.3  CL 106 108  CO2 25 25  GLUCOSE 91 91  BUN 28* 35*  CREATININE 1.05 1.34  CALCIUM 7.8* 7.9*   PT/INR No results for input(s): LABPROT, INR in the last 72 hours. ABG No results for input(s): PHART, HCO3 in the last 72 hours.  Invalid input(s): PCO2, PO2  Studies/Results: Dg Chest Port 1 View  05/05/2014   CLINICAL DATA:  Shortness of Breath  EXAM: PORTABLE CHEST - 1 VIEW  COMPARISON:  05/02/2014  FINDINGS: Cardiac shadow is mildly enlarged but stable. The right jugular central line is now repositioned within the mid superior vena cava. A nasogastric catheter is seen within the stomach. The endotracheal tube has been removed. Patchy infiltrates are seen in the right upper and left upper lobes. The left upper lobe changes are new from the prior study. The right upper lobe changes are relatively stable.  IMPRESSION: Interval repositioning of right jugular catheter.  Endotracheal tube removal.  Bilateral patchy infiltrates right greater than left.   Electronically Signed   By: Inez Catalina M.D.   On: 05/05/2014 12:27     Anti-infectives: Anti-infectives    Start     Dose/Rate Route Frequency Ordered Stop   05/03/14 1400  imipenem-cilastatin (PRIMAXIN) 500 mg in sodium chloride 0.9 % 100 mL IVPB     500 mg 200 mL/hr over 30 Minutes Intravenous 3 times per day 05/03/14 1021     05/04/2014 2200  vancomycin (VANCOCIN) IVPB 1000 mg/200 mL premix  Status:  Discontinued     1,000 mg 200 mL/hr over 60 Minutes Intravenous Every 12 hours 04/30/2014 1020 05/04/14 0343   04/21/2014 1400  imipenem-cilastatin (PRIMAXIN) 500 mg in sodium chloride 0.9 % 100 mL IVPB  Status:  Discontinued     500 mg 200 mL/hr over 30 Minutes Intravenous 3 times per day 04/22/2014 0958 04/23/2014 1025   05/05/2014 1200  imipenem-cilastatin (PRIMAXIN) 500 mg in sodium chloride 0.9 % 100 mL IVPB  Status:  Discontinued     500 mg 200 mL/hr over 30 Minutes Intravenous 4 times per day 05/08/2014 1025 05/03/14 1021   05/09/2014 1100  vancomycin (VANCOCIN) 2,000 mg in sodium chloride 0.9 % 500 mL IVPB     2,000 mg 250 mL/hr over 120 Minutes Intravenous  Once 04/29/2014 1020 04/21/2014 1330   05/06/2014 2200  rifaximin (XIFAXAN) tablet 550 mg  Status:  Discontinued     550 mg Oral 2 times daily 04/24/2014 1937 04/28/2014 0834      Assessment/Plan: s/p Procedure(s): EXPLORATORY LAPAROTOMY (N/A) LAPAROSCOPIC LYSIS OF  ADHESIONS PARACENTESIS   Ileus - for now cont NG tube to LIWS; abd exam difficult given ascites; will check plain film Oliguria - Cr better but appears intravascular dry; check CVP; agree with some mIVF; pt had been KVO Aggressive pulm toilet PCMN - will have to defer to medicine about initiation of TPN in setting of cirrhosis  [name]. Redmond Pulling, MD, FACS General, Bariatric, & Minimally Invasive Surgery [facility], [place]     LOS: 10 days    Gayland Curry 05/07/2014

## 2014-05-07 NOTE — Progress Notes (Signed)
[facility] Progress Note   Clifford Cook DOB: Oct 18, 1945 DOA: 05/09/2014 PCP: [name], MD  Brief narrative: Clifford Cook is a 69 y.o. male with h/o NASH with cirrhosis presenting with back and abdominal pain and an elevated ammonia level. He also admitted to not taking lactulose for 2 days and had an ammonia level of 103. Resuming lactulose resulted in improving the ammonia level.   Further work up for back pain with an MRI revealed subacute impression fractures of L2, L3, L5 and acute on chronic L3 and L4. A kyphoplasty was planned. He was started on IV morphine for his back pain.  A paracentesis for was performed on 4/15 for abdominal pain and distention and 3.1 L were removed.  He unfortunately developed an ileus presenting with vomiting on 4/17. CT performed on 4/17 scan revealed small bowel obstruction with pneumatosis. He was taken emergently to the OR on 4/17 but no evidence of pneumatosis perforation, ischemia or obstruction was noted. He was subsequently managed in the ICU by the critical care team and there was some concern of him having developed pneumonia with a chest x-ray revealing right mid lung and basilar opacities therefore he was given vancomycin and Primaxin. He was treated for septic shock with pressures and fluids. Pressors were stopped on 4/18 He was extubated on 4/19 4/20 repeat paracentesis done at bedside with 2.5 L of fluid removed.  Subjective: Requiring 40% FiO2 this morning. Slightly sleepy but is oriented. He has no complaints of pain or discomfort.  Assessment/Plan: Principal Problem:  Acute respiratory failure-  HCAP - Currently treating with Primaxin which was started on 4/19 -Still requiring 50% FiO2 to maintain his saturations -Last chest x-ray done 4/21 continued to show bilateral infiltrates-repeat chest x-ray today - CVP 13- will give a dose of lasix - obtain ECHO  Active Problems: Septic shock - due to above-  resolved    SBO (small bowel obstruction) s/p ex lap/lysis of adhesions 04/30/2014 - Now with postop ileus-abdominal x-ray shows some improvement and surgery has advised to start trickle tube feeds which I have ordered    Liver cirrhosis secondary to NASH - Child Pugh class C    Ascites - Lasix and Aldactone on hold as he has no PO intake - s/p Paracentesis x 2    Anemia of chronic disease - Hb stable    Thrombocytopenia - due to chronic liver disease    Hyperammonemia - ammonia level normal yesterday -  - follow regularly- Lactulose and Xifaxin on hold due to ileus    Compression fractures - currently Kypoplasty on hold- no complaints of pain this AM- Minimize narcotics to allow ileus to resolve  Code Status: Full code Family Communication:  Wife, son and daughter Disposition Plan: prognosis guarded likely will go to SNF when stabalized DVT prophylaxis: Lovenox Consultants:Surgery/ PCCM Procedures: paracentesis x 2, right IJ, Ex lap with lysis of adhesions 4/17  Antibiotics: Anti-infectives    Start     Dose/Rate Route Frequency Ordered Stop   05/03/14 1400  imipenem-cilastatin (PRIMAXIN) 500 mg in sodium chloride 0.9 % 100 mL IVPB     500 mg 200 mL/hr over 30 Minutes Intravenous 3 times per day 05/03/14 1021     04/30/2014 2200  vancomycin (VANCOCIN) IVPB 1000 mg/200 mL premix  Status:  Discontinued     1,000 mg 200 mL/hr over 60 Minutes Intravenous Every 12 hours 04/30/2014 1020 05/04/14 0343   04/25/2014 1400  imipenem-cilastatin (PRIMAXIN) 500 mg in sodium  chloride 0.9 % 100 mL IVPB  Status:  Discontinued     500 mg 200 mL/hr over 30 Minutes Intravenous 3 times per day 04/18/2014 0958 05/02/2014 1025   04/25/2014 1200  imipenem-cilastatin (PRIMAXIN) 500 mg in sodium chloride 0.9 % 100 mL IVPB  Status:  Discontinued     500 mg 200 mL/hr over 30 Minutes Intravenous 4 times per day 04/24/2014 1025 05/03/14 1021   05/09/2014 1100  vancomycin (VANCOCIN) 2,000 mg in sodium chloride 0.9 %  500 mL IVPB     2,000 mg 250 mL/hr over 120 Minutes Intravenous  Once 04/23/2014 1020 05/02/2014 1330   05/04/2014 2200  rifaximin (XIFAXAN) tablet 550 mg  Status:  Discontinued     550 mg Oral 2 times daily 05/14/2014 1937 04/30/2014 0834      Objective: Filed Weights   04/29/14 1708 05/02/14 0105 05/04/14 0400  Weight: 88.33 kg (194 lb 11.7 oz) 88.1 kg (194 lb 3.6 oz) 93.8 kg (206 lb 12.7 oz)    Intake/Output Summary (Last 24 hours) at 05/07/14 1117 Last data filed at 05/07/14 0700  Gross per 24 hour  Intake    800 ml  Output    402 ml  Net    398 ml     Vitals Filed Vitals:   05/07/14 0804 05/07/14 0809 05/07/14 0825 05/07/14 0903  BP:      Pulse:   104   Temp:    98.5 F (36.9 C)  TempSrc:      Resp: 24 11 10    Height:      Weight:      SpO2: 91% 92% 94%     Exam:  General:  Pt is sleepy but arousable, not in acute distress  HEENT: No icterus, No thrush  Cardiovascular: regular rate and rhythm, S1/S2 No murmur  Respiratory: mild wheeze and rhonchi b/l pulse ox 94 on 50 % FiO2  Abdomen: Soft, bowel sounds present but minimal, non-tender, incision/staples intact, distended and tympanic, no guarding  MSK: No LE edema, cyanosis or clubbing  Data Reviewed: Basic Metabolic Panel:  Recent Labs Lab 05/03/14 1245 05/04/14 1018 05/05/14 0543 05/06/14 0526 05/07/14 0532  NA 131* 135 134* 138 140  K 4.1 4.1 3.7 3.8 4.3  CL 101 103 105 106 108  CO2 25 24 24 25 25   GLUCOSE 143* 101* 117* 91 91  BUN 29* 34* 31* 28* 35*  CREATININE 1.69* 1.34 1.06 1.05 1.34  CALCIUM 7.2* 7.4* 7.8* 7.8* 7.9*   Liver Function Tests:  Recent Labs Lab 04/27/2014 0504 05/02/14 0415 05/07/14 0532  AST 36 42* 39*  ALT 23 21 18   ALKPHOS 146* 128* 107  BILITOT 3.9* 3.7* 3.2*  PROT 5.4* 5.4* 4.7*  ALBUMIN 2.0* 2.1* 2.1*   No results for input(s): LIPASE, AMYLASE in the last 168 hours.  Recent Labs Lab 05/06/14 2130  AMMONIA 32   CBC:  Recent Labs Lab 05/02/14 0415  05/03/14 0420 05/05/14 0543 05/05/14 1302 05/07/14 0532  WBC 14.2* 14.9* 12.0* 11.1* 12.1*  HGB 9.9* 8.4* 8.5* 8.7* 8.5*  HCT 31.8* 25.1* 25.8* 26.7* 26.9*  MCV 99.4 95.1 95.6 96.0 98.5  PLT 193 152 115* 114* 140*   Cardiac Enzymes: No results for input(s): CKTOTAL, CKMB, CKMBINDEX, TROPONINI in the last 168 hours. BNP (last 3 results) No results for input(s): BNP in the last 8760 hours.  ProBNP (last 3 results)  Recent Labs  11/15/13 1237  PROBNP 864.3*    CBG: No results for input(s):  GLUCAP in the last 168 hours.  Recent Results (from the past 240 hour(s))  Body fluid culture     Status: None   Collection Time: 04/29/14  9:49 AM  Result Value Ref Range Status   Specimen Description ASCITIC  Final   Special Requests NONE  Final   Gram Stain   Final    RARE WBC PRESENT, PREDOMINANTLY MONONUCLEAR NO ORGANISMS SEEN Performed at [facility]    Culture   Final    NO GROWTH 3 DAYS Performed at [facility]    Report Status 05/04/2014 FINAL  Final  Culture, blood (routine x 2)     Status: None   Collection Time: 05/05/2014 12:11 PM  Result Value Ref Range Status   Specimen Description BLOOD LEFT ARM  Final   Special Requests   Final    BOTTLES DRAWN AEROBIC AND ANAEROBIC 10CC BOTH BOTTLES   Culture   Final    NO GROWTH 5 DAYS Performed at [facility]    Report Status 05/07/2014 FINAL  Final  Culture, blood (routine x 2)     Status: None   Collection Time: 04/15/2014 12:22 PM  Result Value Ref Range Status   Specimen Description BLOOD LEFT HAND  Final   Special Requests   Final    BOTTLES DRAWN AEROBIC AND ANAEROBIC BAA 10CC BOTH BOTTLES   Culture   Final    NO GROWTH 5 DAYS Performed at [facility]    Report Status 05/07/2014 FINAL  Final  Urine culture     Status: None   Collection Time: 05/02/14  5:00 AM  Result Value Ref Range Status   Specimen Description URINE, CLEAN CATCH  Final   Special Requests NONE  Final    Colony Count NO GROWTH Performed at [facility]   Final   Culture NO GROWTH Performed at [facility]   Final   Report Status 05/03/2014 FINAL  Final  MRSA PCR Screening     Status: None   Collection Time: 05/03/14 11:20 AM  Result Value Ref Range Status   MRSA by PCR NEGATIVE NEGATIVE Final    Comment:        The GeneXpert MRSA Assay (FDA approved for NASAL specimens only), is one component of a comprehensive MRSA colonization surveillance program. It is not intended to diagnose MRSA infection nor to guide or monitor treatment for MRSA infections.      Studies:  Recent x-ray studies have been reviewed in detail by the Attending Physician  Scheduled Meds:  Scheduled Meds: . antiseptic oral rinse  7 mL Mouth Rinse QID  . bisacodyl  10 mg Rectal Daily  . chlorhexidine  15 mL Mouth Rinse BID  . enoxaparin (LOVENOX) injection  40 mg Subcutaneous Q24H  . furosemide  40 mg Intravenous TID  . imipenem-cilastatin  500 mg Intravenous 3 times per day  . lip balm  1 application Topical BID  . pantoprazole (PROTONIX) IV  40 mg Intravenous QHS  . sodium chloride  1,000 mL Intravenous Once   Continuous Infusions: . lactated ringers 10 mL/hr at 05/05/14 1900    Time spent on care of this patient: 40 min   Brooks, MD 05/07/2014, 11:17 AM  LOS: 10 days   [facility] Office  [phone] Pager - Text Page per [email or web address]  If 7PM-7AM, please contact night-coverage [email or web address]

## 2014-05-07 NOTE — Progress Notes (Signed)
eLink Physician-Brief Progress Note Patient Name: Clifford Cook DOB: Jul 08, 1945 MRN: [id number]   Date of Service  05/07/2014  HPI/Events of Note  Hypotension with SBP = 86. Patient is already on Primaxin and Vancomycin.  eICU Interventions  Will order: 1. Restart Norepinephrine IV infusion. 2. Blood Cultures X 2.      Intervention Category Intermediate Interventions: Hypotension - evaluation and management  Lysle Dingwall 05/07/2014, 9:49 PM

## 2014-05-07 NOTE — Progress Notes (Signed)
Brief Nutrition Note  Consult received for enteral/tube feeding initiation and management.  Adult Enteral Nutrition Protocol initiated. Full assessment to follow.  Admitting Dx: Lactic acidosis [E87.2] Hyperammonemia [E72.20]  Body mass index is 31.45 kg/(m^2). Pt meets criteria for obesity based on current BMI.  Labs:   Recent Labs Lab 05/05/14 0543 05/06/14 0526 05/07/14 0532  NA 134* 138 140  K 3.7 3.8 4.3  CL 105 106 108  CO2 24 25 25   BUN 31* 28* 35*  CREATININE 1.06 1.05 1.34  CALCIUM 7.8* 7.8* 7.9*  GLUCOSE 117* 91 91    Jarome Matin, RD, LDN Inpatient Clinical Dietitian Pager # [phone] After hours/weekend pager # [phone]

## 2014-05-07 NOTE — Progress Notes (Signed)
eLink Physician-Brief Progress Note Patient Name: Clifford Cook DOB: 12/19/1945 MRN: [id number]   Date of Service  05/07/2014  HPI/Events of Note  Hypotension - BP = 74/26.  eICU Interventions  Bolus with 0.9 NaCl 1 liter IV over 1 hour now.      Intervention Category Major Interventions: Hypotension - evaluation and management  Lysle Dingwall 05/07/2014, 6:43 PM

## 2014-05-07 NOTE — Progress Notes (Signed)
ANTIBIOTIC CONSULT NOTE - FOLLOW UP  Pharmacy Consult for Primaxin, Vancomycin Indication: HCAP  Allergies  Allergen Reactions  . Pulmicort [Budesonide] Shortness Of Breath    Causes stridor  . Penicillins Other (See Comments)    Rash hives, white spots.    Patient Measurements: Height: 5\' 8"  (172.7 cm) Weight: 206 lb 12.7 oz (93.8 kg) IBW/kg (Calculated) : 68.4  Vital Signs: Temp: 98.5 F (36.9 C) (04/23 0903) BP: 93/48 mmHg (04/23 1200) Pulse Rate: 105 (04/23 1258) Intake/Output from previous day: 04/22 0701 - 04/23 0700 In: 840 [I.V.:240; IV Piggyback:600] Out: 402 [Urine:402]  Labs:  Recent Labs  05/05/14 0543 05/05/14 1302 05/06/14 0526 05/07/14 0532  WBC 12.0* 11.1*  --  12.1*  HGB 8.5* 8.7*  --  8.5*  PLT 115* 114*  --  140*  CREATININE 1.06  --  1.05 1.34   Estimated Creatinine Clearance: 57.8 mL/min (by C-G formula based on Cr of 1.34). No results for input(s): VANCOTROUGH, VANCOPEAK, VANCORANDOM, GENTTROUGH, GENTPEAK, GENTRANDOM, TOBRATROUGH, TOBRAPEAK, TOBRARND, AMIKACINPEAK, AMIKACINTROU, AMIKACIN in the last 72 hours.    Assessment: 27 y/oM with PMH of NASH cirrhosis, anemia, thrombocytopenia, CKD stage III, Strep A PNA, COPD, hepatic encephalopathy who presented to [facility] ED on 4/13 with generalized weakness, abdominal distention, and lower back pain, found to have hyperammonemia and ascites.  Patient developed ileus and vomiting.  Pharmacy was consulted to renally adjust antibiotics.  Antibiotics were narrowed to Primaxin alone on 4/20, but Vancomycin added back on 4/23 for worsening pneumonia.  4/17 >> Vancomycin >> 4/20 4/17 >> Primaxin >>   4/23 >> Vanc resumed >>  Today, 05/07/2014: Day #7 antibiotics  Temp: afebrile since admission  WBC: elevated, 12.1   Renal: new AKI had resolved, but increased 4/23 with SCr 1.34 and CrCl ~ 58 ml/min CG (~ 53N)   Cultures (blood, urine, ascitic) unrevealing to date.   Goal of Therapy:  Appropriate abx  dosing, eradication of infection.   Plan:   Continue Primaxin 500mg  IV q8h. Vancomycin 750 mg IV q12h. Measure Vanc trough at steady state.  Follow up renal fxn, culture results, and clinical course.   Gretta Arab PharmD, BCPS Pager [phone] 05/07/2014 1:22 PM

## 2014-05-07 NOTE — Progress Notes (Signed)
1330-Placed NG tube back on low intermittent suction due to stomach pain. Will hold tube feeding until pain resides.

## 2014-05-07 NOTE — Progress Notes (Signed)
Patient is continually calling out, "help me" and he has not sleep more than a few minutes at a time all day. Elink, notified and orders received.

## 2014-05-07 NOTE — Progress Notes (Addendum)
Patient ID: Clifford Cook, male   DOB: Apr 25, 1945, 69 y.o.   MRN: [id number] Came by and checked on pt after speaking with nurse earlier this evening about family's request to speak with an MD. At that time advised her i was in [place] and to contact [facility] since they had concerns about BP, urine output since they were primary team. This was soonest I could come by. Throughout day pt has been intermittently moaning in pain, transiently hypotensive, remains oligouric.   His condition today has deteriorated from Friday. The low urine output is new along with transient hypotension as well as his alertness.   His abd xray today showed improving ileus, SB diameter was much improved. He does not have a surgical drain as reported on xray.   His abd exam is unchanged from the past several days. His wbc is stable today. No fever.   Not sure what is etiology of his change today. His abdomen appears stable and improved at least on plain xray. Spoke with family.   This may probably just be his underlying cirrhosis finally catching up with him.   Will discuss with [facility] ICU doctor  [name]. Redmond Pulling, MD, FACS General, Bariatric, & Minimally Invasive Surgery [facility], [place]

## 2014-05-07 NOTE — Progress Notes (Signed)
[facility] Progress Note Patient Name: Clifford Cook DOB: Jun 10, 1945 MRN: [id number]   Date of Service  05/07/2014  HPI/Events of Note  Episodes of HR into the 140's only to decrease to 100 - 110's spontaneously.   eICU Interventions  Will check BMP and Mg++ level now.     Intervention Category Major Interventions: Arrhythmia - evaluation and management  Sommer,Steven Cornelia Copa 05/07/2014, 10:25 PM

## 2014-05-07 NOTE — Progress Notes (Addendum)
eLink Physician-Brief Progress Note Patient Name: Clifford Cook DOB: 12-06-1945 MRN: [id number]   Date of Service  05/07/2014  HPI/Events of Note  Severe agitation/delirium. QTc interval = 430 milliseconds.   eICU Interventions  Will order Haldol 2 mg Q 6 hours PRN agitation. Monitor QTc interval Q 6 hours.      Intervention Category Major Interventions: Delirium, psychosis, severe agitation - evaluation and management  Sommer,Steven Eugene 05/07/2014, 4:51 PM

## 2014-05-07 NOTE — Progress Notes (Signed)
eLink Physician-Brief Progress Note Patient Name: Clifford Cook DOB: 1945-08-17 MRN: [id number]   Date of Service  05/07/2014  HPI/Events of Note  Oliguria. Patient has made 10 mL of urine in the last 2 hours.   eICU Interventions  Will give 25% albumin 25 gm IV now.      Intervention Category Intermediate Interventions: Oliguria - evaluation and management  Haris Baack Eugene 05/07/2014, 10:38 PM

## 2014-05-07 NOTE — Progress Notes (Signed)
  Echocardiogram 2D Echocardiogram has been performed.  Lysle Rubens 05/07/2014, 3:09 PM

## 2014-05-08 DIAGNOSIS — N179 Acute kidney failure, unspecified: Secondary | ICD-10-CM

## 2014-05-08 DIAGNOSIS — K7460 Unspecified cirrhosis of liver: Secondary | ICD-10-CM

## 2014-05-08 DIAGNOSIS — R579 Shock, unspecified: Secondary | ICD-10-CM

## 2014-05-08 DIAGNOSIS — R601 Generalized edema: Secondary | ICD-10-CM

## 2014-05-08 LAB — BASIC METABOLIC PANEL
Anion gap: 12 (ref 5–15)
BUN: 44 mg/dL — AB (ref 6–23)
CO2: 23 mmol/L (ref 19–32)
Calcium: 8.1 mg/dL — ABNORMAL LOW (ref 8.4–10.5)
Chloride: 108 mmol/L (ref 96–112)
Creatinine, Ser: 1.82 mg/dL — ABNORMAL HIGH (ref 0.50–1.35)
GFR calc Af Amer: 42 mL/min — ABNORMAL LOW (ref >90)
GFR calc non Af Amer: 36 mL/min — ABNORMAL LOW (ref >90)
GLUCOSE: 147 mg/dL — AB (ref 70–99)
POTASSIUM: 4.4 mmol/L (ref 3.5–5.1)
SODIUM: 143 mmol/L (ref 135–145)

## 2014-05-08 LAB — GLUCOSE, CAPILLARY
GLUCOSE-CAPILLARY: 138 mg/dL — AB (ref 70–99)
GLUCOSE-CAPILLARY: 141 mg/dL — AB (ref 70–99)
GLUCOSE-CAPILLARY: 155 mg/dL — AB (ref 70–99)
Glucose-Capillary: 112 mg/dL — ABNORMAL HIGH (ref 70–99)
Glucose-Capillary: 133 mg/dL — ABNORMAL HIGH (ref 70–99)
Glucose-Capillary: 136 mg/dL — ABNORMAL HIGH (ref 70–99)
Glucose-Capillary: 144 mg/dL — ABNORMAL HIGH (ref 70–99)

## 2014-05-08 LAB — CBC
HEMATOCRIT: 25.9 % — AB (ref 39.0–52.0)
Hemoglobin: 8.2 g/dL — ABNORMAL LOW (ref 13.0–17.0)
MCH: 31.5 pg (ref 26.0–34.0)
MCHC: 31.7 g/dL (ref 30.0–36.0)
MCV: 99.6 fL (ref 78.0–100.0)
Platelets: 178 10*3/uL (ref 150–400)
RBC: 2.6 MIL/uL — ABNORMAL LOW (ref 4.22–5.81)
RDW: 19.3 % — ABNORMAL HIGH (ref 11.5–15.5)
WBC: 12.5 10*3/uL — AB (ref 4.0–10.5)

## 2014-05-08 LAB — MAGNESIUM: Magnesium: 2.1 mg/dL (ref 1.5–2.5)

## 2014-05-08 LAB — AMMONIA: AMMONIA: 69 umol/L — AB (ref 11–32)

## 2014-05-08 LAB — VANCOMYCIN, TROUGH: Vancomycin Tr: 23 ug/mL — ABNORMAL HIGH (ref 10.0–20.0)

## 2014-05-08 MED ORDER — NOREPINEPHRINE BITARTRATE 1 MG/ML IV SOLN
0.0000 ug/min | INTRAVENOUS | Status: DC
  Administered 2014-05-08: 30 ug/min via INTRAVENOUS
  Administered 2014-05-08: 18 ug/min via INTRAVENOUS
  Administered 2014-05-09: 17 ug/min via INTRAVENOUS
  Filled 2014-05-08 (×3): qty 16

## 2014-05-08 MED ORDER — PRO-STAT SUGAR FREE PO LIQD
30.0000 mL | Freq: Two times a day (BID) | ORAL | Status: DC
  Filled 2014-05-08 (×2): qty 30

## 2014-05-08 MED ORDER — MORPHINE SULFATE 2 MG/ML IJ SOLN
2.0000 mg | INTRAMUSCULAR | Status: DC | PRN
  Administered 2014-05-08 – 2014-05-09 (×12): 2 mg via INTRAVENOUS
  Filled 2014-05-08 (×13): qty 1

## 2014-05-08 MED ORDER — VANCOMYCIN HCL 10 G IV SOLR
1250.0000 mg | INTRAVENOUS | Status: DC
  Administered 2014-05-08 – 2014-05-09 (×2): 1250 mg via INTRAVENOUS
  Filled 2014-05-08 (×2): qty 1250

## 2014-05-08 MED ORDER — VITAL HIGH PROTEIN PO LIQD
1000.0000 mL | ORAL | Status: DC
  Filled 2014-05-08: qty 1000

## 2014-05-08 MED ORDER — VITAL AF 1.2 CAL PO LIQD
1000.0000 mL | ORAL | Status: DC
  Administered 2014-05-09: 1000 mL
  Filled 2014-05-08: qty 1000

## 2014-05-08 MED ORDER — VITAL AF 1.2 CAL PO LIQD
1000.0000 mL | ORAL | Status: DC
  Administered 2014-05-08: 1000 mL
  Filled 2014-05-08: qty 1000

## 2014-05-08 NOTE — Progress Notes (Signed)
ANTIBIOTIC CONSULT NOTE - FOLLOW UP  Pharmacy Consult for Primaxin, Vancomycin Indication: HCAP  Allergies  Allergen Reactions  . Pulmicort [Budesonide] Shortness Of Breath    Causes stridor  . Penicillins Other (See Comments)    Rash hives, white spots.    Patient Measurements: Height: 5\' 8"  (172.7 cm) Weight: 192 lb 14.4 oz (87.5 kg) IBW/kg (Calculated) : 68.4  Vital Signs: Temp: 98.7 F (37.1 C) (04/24 0837) Temp Source: Oral (04/24 0837) BP: 97/33 mmHg (04/24 0945) Pulse Rate: 116 (04/24 0945) Intake/Output from previous day: 04/23 0701 - 04/24 0700 In: 2659.6 [I.V.:661.6; IV Piggyback:1900] Out: 325 [Urine:225; Emesis/NG output:100]  Labs:  Recent Labs  05/05/14 1302  05/07/14 0532 05/07/14 2233 05/08/14 0450  WBC 11.1*  --  12.1*  --  12.5*  HGB 8.7*  --  8.5*  --  8.2*  PLT 114*  --  140*  --  178  CREATININE  --   < > 1.34 1.74* 1.82*  < > = values in this interval not displayed. Estimated Creatinine Clearance: 41.2 mL/min (by C-G formula based on Cr of 1.82). No results for input(s): VANCOTROUGH, VANCOPEAK, VANCORANDOM, GENTTROUGH, GENTPEAK, GENTRANDOM, TOBRATROUGH, TOBRAPEAK, TOBRARND, AMIKACINPEAK, AMIKACINTROU, AMIKACIN in the last 72 hours.    Assessment: 66 y/oM with PMH of NASH cirrhosis, anemia, thrombocytopenia, CKD stage III, Strep A PNA, COPD, hepatic encephalopathy who presented to [facility] ED on 4/13 with generalized weakness, abdominal distention, and lower back pain, found to have hyperammonemia and ascites.  Patient developed ileus and vomiting.  Pharmacy was consulted to renally adjust antibiotics.  Antibiotics were narrowed to Primaxin alone on 4/20, but Vancomycin added back on 4/23 for worsening pneumonia.  4/17 >> Vancomycin >> 4/20 4/17 >> Primaxin >>   4/23 >> Vanc resumed >>  Today, 05/08/2014: Day #8 antibiotics  Temp: afebrile since admission  WBC: elevated and increased, 12.5  Renal: AKI had resolved, but SCr now increasing again.   SCr 1.82, CrCl ~ 41 ml/min CG (~ 39N)   Cultures (blood, urine, ascitic) unrevealing to date.  Vancomycin trough level 23, above goal level  (early, before 3rd dose d/t rising SCr)   Goal of Therapy:  Vancomycin trough level 15-20  Appropriate abx dosing, eradication of infection.   Plan:   Continue Primaxin 500mg  IV q8h. Decrease to Vancomycin 1250 mg IV q24h. Recheck Vanc trough as needed.  Follow up renal fxn, culture results, and clinical course.   Gretta Arab PharmD, BCPS Pager [phone] 05/08/2014 2:13 PM

## 2014-05-08 NOTE — Care Management Note (Signed)
CARE MANAGEMENT NOTE 05/08/2014  Patient:  Clifford Cook, Clifford Cook   Account Number:  [id number]  Date Initiated:  04/28/2014  Documentation initiated by:  Edwyna Shell  Subjective/Objective Assessment:   69 yo male admitted hyperammonemia     Action/Plan:   discharge planning   Anticipated DC Date:  05/11/2014   Anticipated DC Plan:  [place]  CM consult      Choice offered to / List presented to:             Status of service:  In process, will continue to follow Medicare Important Message given?   (If response is "NO", the following Medicare IM given date fields will be blank) Date Medicare IM given:   Medicare IM given by:   Date Additional Medicare IM given:   Additional Medicare IM given by:    Discharge Disposition:    Per UR Regulation:    If discussed at Long Length of Stay Meetings, dates discussed:    Comments:  May 08, 2014/Rhonda L. Rosana Hoes, RN, BSN, CCM. Case Management [place] [phone] No discharge needs present of time of review. remains hypotesive and on iv levophed for support/aki/copd-requiring venturi mask at 100% o2  May 04, 2014/Rhonda L. Rosana Hoes, RN, BSN, CCM. Case Management [place] [phone] Clinicals faxed to [facility] review on [phone] successfully//extubated [id number]./ [id number] paracenthesis: Cook total of 2.5 liters of serous sanguinous peritoneal fluid was aspirated.   The pt did develop some hypotension at the completion of liter #2. W/ SBP as low as 79. This responded to 25 gms of Albumin nicely.   04/29/14 Edwyna Shell RN BSN CM [phone] Spoke with patient and he stated that he prefers [place] for [facility] services.  04/28/14 Edwyna Shell RN BSn CM [phone] Patient lives at home and has walker, cane, BSC, and shower chair. He has had [place] services in the past with both [place] and [facility]. Daughter Clifford [facility] in room with patient. Patient and daughter stated that he does not want to  go to [facility] for rehab and prefders home with [facility] services. Will continue to follow for dc needs.

## 2014-05-08 NOTE — Progress Notes (Addendum)
7 Days Post-Op  Subjective: Started on levo for persistent hypoTN; remains with low urine output; Cr increasing; not able to give subjective c/o  Objective: Vital signs in last 24 hours: Temp:  [98 F (36.7 C)-99.1 F (37.3 C)] 99.1 F (37.3 C) (04/24 0400) Pulse Rate:  [99-133] 112 (04/24 0643) Resp:  [8-29] 11 (04/24 0643) BP: (74-129)/(22-97) 110/43 mmHg (04/24 0630) SpO2:  [77 %-99 %] 98 % (04/24 0643) FiO2 (%):  [35 %-100 %] 100 % (04/24 0643) Last BM Date: 05/07/14  Intake/Output from previous day: 04/23 0701 - 04/24 0700 In: 2659.6 [I.V.:661.6; IV Piggyback:1900] Out: 325 [Urine:225; Emesis/NG output:100] Intake/Output this shift:    Intermittently follows commands, OE to voice; moaning 'help me' at times Upper airway congestion; on nrb. Fairly clear Tachy Distended, soft, incision ok. hypoBS   Lab Results:   Recent Labs  05/07/14 0532 05/08/14 0450  WBC 12.1* 12.5*  HGB 8.5* 8.2*  HCT 26.9* 25.9*  PLT 140* 178   BMET  Recent Labs  05/07/14 2233 05/08/14 0450  NA 140 143  K 4.4 4.4  CL 112 108  CO2 23 23  GLUCOSE 95 147*  BUN 44* 44*  CREATININE 1.74* 1.82*  CALCIUM 7.8* 8.1*   PT/INR No results for input(s): LABPROT, INR in the last 72 hours. ABG No results for input(s): PHART, HCO3 in the last 72 hours.  Invalid input(s): PCO2, PO2  Studies/Results: Dg Chest Port 1 View  05/07/2014   CLINICAL DATA:  Respiratory failure  EXAM: PORTABLE CHEST - 1 VIEW  COMPARISON:  05/05/2014  FINDINGS: NG tube and right jugular venous catheter are stable. Diffuse bilateral airspace disease is worse. No pneumothorax. Upper normal heart size.  IMPRESSION: Worsening diffuse bilateral airspace disease.   Electronically Signed   By: Marybelle Killings M.D.   On: 05/07/2014 11:44   Dg Abd Portable 1v  05/07/2014   CLINICAL DATA:  69 year old male with a history of abdominal distention  EXAM: PORTABLE ABDOMEN - 1 VIEW  COMPARISON:  CT 05/11/2014, plain film 05/05/2014   FINDINGS: Single frontal view of the abdomen with exclusion of superior inferior and lateral portions.  Multiple central air filled small bowel loops. Diane of the largest air filled loops smaller than the comparison plain film.  Surgical staples over the midline abdomen.  Surgical drain within the low abdomen.  Surgical changes at the left proximal femur, incompletely imaged.  No evidence of free air on this limited plain film.  Gastric tube terminates in the left upper quadrant.  IMPRESSION: Persisting gas-filled small bowel status post laparoscopy, likely representing resolving ileus, as the largest loops have decreased in size from the prior. Surveillance of this abnormal bowel gas pattern is recommended.  Surgical changes as above.  Gastric tube remains in place in the upper abdomen.  Signed,  Dulcy Fanny. Earleen Newport, DO  [facility]  [facility]   Electronically Signed   By: Corrie Mckusick D.O.   On: 05/07/2014 10:08    Anti-infectives: Anti-infectives    Start     Dose/Rate Route Frequency Ordered Stop   05/07/14 1400  vancomycin (VANCOCIN) IVPB 750 mg/150 ml premix     750 mg 150 mL/hr over 60 Minutes Intravenous Every 12 hours 05/07/14 1336     05/03/14 1400  imipenem-cilastatin (PRIMAXIN) 500 mg in sodium chloride 0.9 % 100 mL IVPB     500 mg 200 mL/hr over 30 Minutes Intravenous 3 times per day 05/03/14 1021  04/28/2014 2200  vancomycin (VANCOCIN) IVPB 1000 mg/200 mL premix  Status:  Discontinued     1,000 mg 200 mL/hr over 60 Minutes Intravenous Every 12 hours 04/21/2014 1020 05/04/14 0343   05/09/2014 1400  imipenem-cilastatin (PRIMAXIN) 500 mg in sodium chloride 0.9 % 100 mL IVPB  Status:  Discontinued     500 mg 200 mL/hr over 30 Minutes Intravenous 3 times per day 05/02/2014 0958 04/18/2014 1025   04/16/2014 1200  imipenem-cilastatin (PRIMAXIN) 500 mg in sodium chloride 0.9 % 100 mL IVPB  Status:  Discontinued     500 mg 200 mL/hr over 30 Minutes  Intravenous 4 times per day 04/18/2014 1025 05/03/14 1021   04/15/2014 1100  vancomycin (VANCOCIN) 2,000 mg in sodium chloride 0.9 % 500 mL IVPB     2,000 mg 250 mL/hr over 120 Minutes Intravenous  Once 04/28/2014 1020 05/12/2014 1330   04/23/2014 2200  rifaximin (XIFAXAN) tablet 550 mg  Status:  Discontinued     550 mg Oral 2 times daily 04/18/2014 1937 05/02/2014 0834      Assessment/Plan: s/p Procedure(s): EXPLORATORY LAPAROTOMY (N/A) LAPAROSCOPIC LYSIS OF ADHESIONS PARACENTESIS  Pt has worsening clinical picture with olioguric renal failure, Cr now going back up Severe protein calorie malnutrition Cirrhosis PNA  F/u cultures The vasopressor unfortunately will not help his ileus. Ok with trying trickle tube feeds. However if get large residual would just stop TF Defer to CCM/[facility] on whether or not pt is candidate for TPN given his cirrhosis ?encephalopathy - ? Need to repeat ammonia level; was elevated yesterday, defer to medicine  Spoke with son and daughter that they may as a family want to think about code status - intubation, cpr, meds to restart heart, etc.   [name]. Redmond Pulling, MD, FACS General, Bariatric, & Minimally Invasive Surgery [facility], [place]   LOS: 11 days    Gayland Curry 05/08/2014

## 2014-05-08 NOTE — Progress Notes (Addendum)
NUTRITION FOLLOW-UP/CONSULT  INTERVENTION: -Continue trickle feeds of Vital AF 1.2 @ 10 ml per surgery.  -If pt is able to tolerate diet, continue Boost Plus BID, each supplement provides 360 kcal and 14 g protein.   - RD will continue to monitor for GOC  NUTRITION DIAGNOSIS: Inadequate protein-energy intake related to inability to consume nutrition as evidenced by NPO status; ongoing  Goal: Pt to meet >/= 90% of their estimated nutrition needs, unmet   Monitor:  GOC, TF regimen & tolerance, weight, labs, I/O's  Admitting Dx: HCAP (healthcare-associated pneumonia)  ASSESSMENT: 35yom with PMH of NASH cirrhosis, CKD III, COPD presented on 04/17/2014 with worsening Abd distention and elevated ammonia. He developed pneumatosis on Abd imaging prompting an emergent ex-lap on 4/17.  4/17: Developed ileus vs SBO with vomiting. CT Abd/ pelvis concerning for pneumatosis. Taken to OR for exlap. Transferred to ICU  Extubated 4/19  4/24: -RD consulted to initiate and manage enteral nutrition support.  Trickle feeds of Vital HP initiated on 4/23. -Per RN, MD advised not to advance TF past 10 ml/hr. Pt was just made DNR. -Pt's weight -14 lb since 4/20, most likely d/t fluid -Per surgery note, x-ray showed pt's ileus improved -RD to monitor for [place]  Labs reviewed: Elevated BUN & Creatinine  Height: Ht Readings from Last 1 Encounters:  04/29/14 5\' 8"  (1.727 m)    Weight: Wt Readings from Last 1 Encounters:  05/08/14 192 lb 14.4 oz (87.5 kg)  05/04/14 206 lb  BMI:  Body mass index is 29.34 kg/(m^2).  Re-estimated Nutritional Needs: Kcal: 1600-1800 Protein: 90-100 kcal Fluid: 1.6L/day  Skin: Abdominal and lip incisions, ecchymosis to bilateral arms  Diet Order:    EDUCATION NEEDS: -Education needs addressed   Intake/Output Summary (Last 24 hours) at 05/08/14 1017 Last data filed at 05/08/14 0800  Gross per 24 hour  Intake 2580.83 ml  Output    345 ml  Net 2235.83 ml     Last BM: 4/23  Labs:   Recent Labs Lab 05/07/14 0532 05/07/14 2233 05/08/14 0450  NA 140 140 143  K 4.3 4.4 4.4  CL 108 112 108  CO2 25 23 23   BUN 35* 44* 44*  CREATININE 1.34 1.74* 1.82*  CALCIUM 7.9* 7.8* 8.1*  MG  --  2.1  --   GLUCOSE 91 95 147*    CBG (last 3)   Recent Labs  05/08/14 0027 05/08/14 0544 05/08/14 0759  GLUCAP 112* 155* 136*    Scheduled Meds: . antiseptic oral rinse  7 mL Mouth Rinse QID  . bisacodyl  10 mg Rectal Daily  . chlorhexidine  15 mL Mouth Rinse BID  . enoxaparin (LOVENOX) injection  40 mg Subcutaneous Q24H  . feeding supplement (VITAL HIGH PROTEIN)  1,000 mL Per Tube Q24H  . imipenem-cilastatin  500 mg Intravenous 3 times per day  . lip balm  1 application Topical BID  . pantoprazole (PROTONIX) IV  40 mg Intravenous QHS  . sodium chloride  1,000 mL Intravenous Once  . vancomycin  750 mg Intravenous Q12H    Continuous Infusions: . lactated ringers 10 mL/hr (05/07/14 1757)  . norepinephrine (LEVOPHED) Adult infusion 12 mcg/min (05/08/14 0272)    Clayton Bibles, MS, RD, LDN Pager: [phone] After Hours Pager: [phone]

## 2014-05-08 NOTE — Progress Notes (Signed)
PULMONARY  / CRITICAL CARE MEDICINE CONSULTATION   Name: Clifford Cook MRN: [id number] DOB: [birth year]    ADMISSION DATE:  04/17/2014 CONSULTATION DATE: 05/02/14  REQUESTING CLINICIAN: Dr. Johney Maine PRIMARY SERVICE: Surgery  CHIEF COMPLAINT:  Small bowel obstruction  BRIEF PATIENT DESCRIPTION:   2yom with PMH of NASH cirrhosis, CKD III, COPD presented on 05/14/2014 with worsening Abd distention and elevated ammonia.  He developed pneumatosis on Abd imaging prompting an emergent ex-lap on 4/17.  We were consulted for ICU comanagement.  SIGNIFICANT EVENTS / STUDIES:  05/02/2014:  Admitted with worsening ascites and abd distention 04/29/14:  MRI L spine with multiple subacute compression fx's 04/17/2014:  Developed ileus vs SBO with vomiting.  CT Abd/ pelvis concerning for pneumatosis.  Taken to OR for exlap.  Transferred to ICU. Had lysis of adhesions, but no evidence of spillage  4/18: on pressors, still w/ metabolic acidosis.  4/19 extubated.  4/20 therapeutic paracentesis 4/23 transferred to service or [facility] 4/23. Recurrent shock. Vasopressors resumed 4/24 PCCM asked back on. Made DNR - care limited to medical therapies, IVFs, nutrition. No re-intubation. No HD. Comfort is highest priority   SUBJECTIVE:  Back on vasopressors. Appears uncomfortable - moaning unintelligibly. Increasing O2 requirements PCCM asked back on this AM. Extended discussion with family including wife, daughter, son. Made DNR - care limited to medical therapies, IVFs, nutrition. No re-intubation. No HD. Comfort is highest priority  VITAL SIGNS: Temp:  [98.1 F (36.7 C)-99.1 F (37.3 C)] 98.2 F (36.8 C) (04/24 1200) Pulse Rate:  [99-133] 110 (04/24 1330) Resp:  [9-31] 14 (04/24 1330) BP: (74-140)/(22-97) 99/33 mmHg (04/24 1330) SpO2:  [77 %-100 %] 95 % (04/24 1330) FiO2 (%):  [40 %-100 %] 55 % (04/24 1100) Weight:  [87.5 kg (192 lb 14.4 oz)] 87.5 kg (192 lb 14.4 oz) (04/24 0830)  4 liters HEMODYNAMICS: CVP:  [8  mmHg-14 mmHg] 9 mmHg VENTILATOR SETTINGS: Vent Mode:  [-]  FiO2 (%):  [40 %-100 %] 55 % INTAKE / OUTPUT: Intake/Output      04/23 0701 - 04/24 0700 04/24 0701 - 04/25 0700   I.V. (mL/kg) 689.4 (7.3) 107.7 (1.2)   Other 98    NG/GT  0.7   IV Piggyback 1900    Total Intake(mL/kg) 2687.4 (28.7) 108.3 (1.2)   Urine (mL/kg/hr) 225 (0.1) 165 (0.3)   Emesis/NG output 100 (0)    Stool 0 (0)    Total Output 325 165   Net +2362.4 -56.7        Stool Occurrence 1 x      PHYSICAL EXAMINATION: General: ill appearing man, moaning, moderately dyspneic appearing Neuro:  RASS -2, maoning, no focal deficits HEENT:  PERRL Cardiovascular:  Regular no M Lungs: crackles both bases  Abdomen:  Distended, hypoactive bowel sounds, mildly diffusely tender Ext: severe anasarca  LABS:  CBC  Recent Labs Lab 05/05/14 1302 05/07/14 0532 05/08/14 0450  WBC 11.1* 12.1* 12.5*  HGB 8.7* 8.5* 8.2*  HCT 26.7* 26.9* 25.9*  PLT 114* 140* 178   Coag's No results for input(s): APTT, INR in the last 168 hours. BMET  Recent Labs Lab 05/07/14 0532 05/07/14 2233 05/08/14 0450  NA 140 140 143  K 4.3 4.4 4.4  CL 108 112 108  CO2 25 23 23   BUN 35* 44* 44*  CREATININE 1.34 1.74* 1.82*  GLUCOSE 91 95 147*   Electrolytes  Recent Labs Lab 05/07/14 0532 05/07/14 2233 05/08/14 0450  CALCIUM 7.9* 7.8* 8.1*  MG  --  2.1  --    Sepsis Markers No results for input(s): LATICACIDVEN, PROCALCITON, O2SATVEN in the last 168 hours. ABG  Recent Labs Lab 05/02/14 1200 05/02/14 2047 05/03/14 0445  PHART 7.423 7.465* 7.445  PCO2ART 34.8* 34.8* 36.6  PO2ART 110.0* 71.6* 90.9   Liver Enzymes  Recent Labs Lab 05/02/14 0415 05/07/14 0532  AST 42* 39*  ALT 21 18  ALKPHOS 128* 107  BILITOT 3.7* 3.2*  ALBUMIN 2.1* 2.1*   Cardiac Enzymes No results for input(s): TROPONINI, PROBNP in the last 168 hours. Glucose  Recent Labs Lab 05/07/14 1606 05/07/14 2013 05/08/14 0027 05/08/14 0544  05/08/14 0759 05/08/14 1227  GLUCAP 97 85 112* 155* 136* 133*    Imaging Dg Chest Port 1 View  05/07/2014   CLINICAL DATA:  Respiratory failure  EXAM: PORTABLE CHEST - 1 VIEW  COMPARISON:  05/05/2014  FINDINGS: NG tube and right jugular venous catheter are stable. Diffuse bilateral airspace disease is worse. No pneumothorax. Upper normal heart size.  IMPRESSION: Worsening diffuse bilateral airspace disease.   Electronically Signed   By: Marybelle Killings M.D.   On: 05/07/2014 11:44   Dg Abd Portable 1v  05/07/2014   CLINICAL DATA:  69 year old male with a history of abdominal distention  EXAM: PORTABLE ABDOMEN - 1 VIEW  COMPARISON:  CT 04/22/2014, plain film 05/05/2014  FINDINGS: Single frontal view of the abdomen with exclusion of superior inferior and lateral portions.  Multiple central air filled small bowel loops. Diane of the largest air filled loops smaller than the comparison plain film.  Surgical staples over the midline abdomen.  Surgical drain within the low abdomen.  Surgical changes at the left proximal femur, incompletely imaged.  No evidence of free air on this limited plain film.  Gastric tube terminates in the left upper quadrant.  IMPRESSION: Persisting gas-filled small bowel status post laparoscopy, likely representing resolving ileus, as the largest loops have decreased in size from the prior. Surveillance of this abnormal bowel gas pattern is recommended.  Surgical changes as above.  Gastric tube remains in place in the upper abdomen.  Signed,  Dulcy Fanny. Earleen Newport, DO  [facility]  [facility]   Electronically Signed   By: Corrie Mckusick D.O.   On: 05/07/2014 10:08     CXR: increased bilateral AS dz - edema vs ALI pattern  ASSESSMENT / PLAN:  PULMONARY A: Acute resp failure H/o COPD Recent RUL PNA treated Pulm edema vs ALI  P:   Cont supplemental O2 DNI PRN opioids for intractable dyspnea  CARDIOVASCULAR A: Recurrent shock -  multifactorial P:   Cont vasopressors to maintain MAP > 65 mmHg  RENAL A: Metabolic acidosis, resolved Hyponatremia, resolved AKI P:   Monitor BMET intermittently Monitor I/Os Correct electrolytes as indicated Not a candidate for HD  GASTROINTESTINAL A: s/p ex-lap w/ lysis of adhesions Prolonged post op ileus P:   SUP: N/I Begin trickle TFs 4/24 Consider TPN if his condition stabilizes  HEMATOLOGIC A: Anemia without acute blood loss P:   DVT px: LMWH Monitor CBC intermittently  INFECTIOUS A: SIRS, suspect sepsis - no clear source Possible HCAP (vs atx) P:   All micro reviewed Cont current abx Imipenem 4/20 >>  Vanc 4/24 >>   ENDOCRINE A: Hyperglycemia without prior dx of DM P:   CBG q 8 hrs SSI not presently indicated   NEUROLOGIC A: Post-op pain  Acute on chronic back pain due to compression fractures Acute enephalopathy P:  Cont PRN opioids  I spoke @ length with pt's wife and offspring. We discussed his previous poor health status and QOL as well as his complicated hospital course and severe, recurrent critical illness. He has recently executed a Living Will. We have agreed to limit our interventions to medical therapies only (including vasopressors for now). He is now DNR - no ACLS, no re-intubation, no HD. If he becomes unacceptably uncomfortable, we will provide full comfort care. It is unlikely that he will survive to discharge, but if he does, we will arrange for home with Hospice.  Merton Border, MD ; [facility] [phone].  After 5:30 PM or weekends, call [phone]

## 2014-05-09 ENCOUNTER — Inpatient Hospital Stay (HOSPITAL_COMMUNITY): Payer: Medicare Other

## 2014-05-09 DIAGNOSIS — J9601 Acute respiratory failure with hypoxia: Secondary | ICD-10-CM

## 2014-05-09 DIAGNOSIS — N179 Acute kidney failure, unspecified: Secondary | ICD-10-CM | POA: Insufficient documentation

## 2014-05-09 DIAGNOSIS — A419 Sepsis, unspecified organism: Secondary | ICD-10-CM | POA: Insufficient documentation

## 2014-05-09 DIAGNOSIS — R188 Other ascites: Secondary | ICD-10-CM

## 2014-05-09 DIAGNOSIS — D638 Anemia in other chronic diseases classified elsewhere: Secondary | ICD-10-CM

## 2014-05-09 DIAGNOSIS — R6521 Severe sepsis with septic shock: Secondary | ICD-10-CM

## 2014-05-09 LAB — GLUCOSE, CAPILLARY
Glucose-Capillary: 143 mg/dL — ABNORMAL HIGH (ref 70–99)
Glucose-Capillary: 136 mg/dL — ABNORMAL HIGH (ref 70–99)
Glucose-Capillary: 138 mg/dL — ABNORMAL HIGH (ref 70–99)
Glucose-Capillary: 155 mg/dL — ABNORMAL HIGH (ref 70–99)

## 2014-05-09 LAB — BASIC METABOLIC PANEL
ANION GAP: 8 (ref 5–15)
BUN: 53 mg/dL — AB (ref 6–23)
CHLORIDE: 112 mmol/L (ref 96–112)
CO2: 23 mmol/L (ref 19–32)
Calcium: 7.7 mg/dL — ABNORMAL LOW (ref 8.4–10.5)
Creatinine, Ser: 1.84 mg/dL — ABNORMAL HIGH (ref 0.50–1.35)
GFR calc Af Amer: 41 mL/min — ABNORMAL LOW (ref >90)
GFR, EST NON AFRICAN AMERICAN: 36 mL/min — AB (ref >90)
GLUCOSE: 152 mg/dL — AB (ref 70–99)
POTASSIUM: 4.4 mmol/L (ref 3.5–5.1)
SODIUM: 143 mmol/L (ref 135–145)

## 2014-05-09 LAB — HEPATIC FUNCTION PANEL
ALT: 19 U/L (ref 0–53)
AST: 79 U/L — ABNORMAL HIGH (ref 0–37)
Albumin: 2.3 g/dL — ABNORMAL LOW (ref 3.5–5.2)
Alkaline Phosphatase: 111 U/L (ref 39–117)
Bilirubin, Direct: 1.2 mg/dL — ABNORMAL HIGH (ref 0.0–0.5)
Indirect Bilirubin: 1.7 mg/dL — ABNORMAL HIGH (ref 0.3–0.9)
Total Bilirubin: 2.9 mg/dL — ABNORMAL HIGH (ref 0.3–1.2)
Total Protein: 4.8 g/dL — ABNORMAL LOW (ref 6.0–8.3)

## 2014-05-09 LAB — CBC
HCT: 27.3 % — ABNORMAL LOW (ref 39.0–52.0)
Hemoglobin: 8.6 g/dL — ABNORMAL LOW (ref 13.0–17.0)
MCH: 31.4 pg (ref 26.0–34.0)
MCHC: 31.5 g/dL (ref 30.0–36.0)
MCV: 99.6 fL (ref 78.0–100.0)
Platelets: 141 10*3/uL — ABNORMAL LOW (ref 150–400)
RBC: 2.74 MIL/uL — ABNORMAL LOW (ref 4.22–5.81)
RDW: 19.6 % — ABNORMAL HIGH (ref 11.5–15.5)
WBC: 16 10*3/uL — ABNORMAL HIGH (ref 4.0–10.5)

## 2014-05-09 LAB — PROTIME-INR
INR: 2.75 — ABNORMAL HIGH (ref 0.00–1.49)
Prothrombin Time: 29.3 s — ABNORMAL HIGH (ref 11.6–15.2)

## 2014-05-09 MED ORDER — FUROSEMIDE 10 MG/ML IJ SOLN
80.0000 mg | Freq: Four times a day (QID) | INTRAMUSCULAR | Status: AC
  Administered 2014-05-09 (×2): 80 mg via INTRAVENOUS
  Filled 2014-05-09 (×2): qty 8

## 2014-05-09 MED ORDER — ATROPINE SULFATE 1 % OP SOLN
2.0000 [drp] | Freq: Three times a day (TID) | OPHTHALMIC | Status: DC
  Administered 2014-05-09 – 2014-05-10 (×2): 2 [drp] via SUBLINGUAL
  Filled 2014-05-09: qty 2

## 2014-05-09 MED ORDER — LACTULOSE 10 GM/15ML PO SOLN
20.0000 g | Freq: Once | ORAL | Status: AC
  Administered 2014-05-09: 20 g
  Filled 2014-05-09: qty 30

## 2014-05-09 MED ORDER — MORPHINE SULFATE 2 MG/ML IJ SOLN
2.0000 mg | INTRAMUSCULAR | Status: DC | PRN
  Administered 2014-05-09 – 2014-05-10 (×3): 4 mg via INTRAVENOUS
  Administered 2014-05-10: 5 mg via INTRAVENOUS
  Administered 2014-05-10 (×2): 4 mg via INTRAVENOUS
  Filled 2014-05-09 (×4): qty 2
  Filled 2014-05-09: qty 1
  Filled 2014-05-09: qty 3
  Filled 2014-05-09: qty 2

## 2014-05-09 NOTE — Progress Notes (Signed)
PT Cancellation Note  Patient Details Name: Clifford Cook MRN: [id number] DOB: 1945-06-11   Cancelled Treatment:    Reason Eval/Treat Not Completed: Medical issues which prohibited therapy (notes reviewed. PT will  consider signing off if  condition remains poor . check back 2014/05/22,)   [name] 05/09/2014, 8:18 AM Tresa Endo PT [phone]

## 2014-05-09 NOTE — Progress Notes (Signed)
eLink Physician-Brief Progress Note Patient Name: Clifford Cook DOB: [birth year] MRN: [id number]   Date of Service  05/09/2014  HPI/Events of Note  Spoke with wife who relates that she and the family are all in agreement to proceed with comfort measures only.   eICU Interventions  Will order: 1. Comfort measures. 2. D/C Norepinephrine IV infusion. 3. D/C enteral nutrition. 4. Increase PRN Morphine IV dose.      Intervention Category Minor Interventions: Routine modifications to care plan (e.g. PRN medications for pain, fever);Communication with other healthcare providers and/or family  Lysle Dingwall 05/09/2014, 9:43 PM

## 2014-05-09 NOTE — Progress Notes (Signed)
[facility] Progress Note Patient Name: Clifford Cook DOB: 14-Sep-1945 MRN: [id number]   Date of Service  05/09/2014  HPI/Events of Note  Unable to clear oral secretions.  eICU Interventions  Atropine drops 2 drops sublingual TID.      Intervention Category Minor Interventions: Routine modifications to care plan (e.g. PRN medications for pain, fever)  Sommer,Steven Eugene 05/09/2014, 8:32 PM

## 2014-05-09 NOTE — Progress Notes (Signed)
PULMONARY  / CRITICAL CARE MEDICINE CONSULTATION   Name: Clifford Cook MRN: [id number] DOB: Dec 27, 1945    ADMISSION DATE:  05/02/2014 CONSULTATION DATE: 05/02/14  REQUESTING CLINICIAN: Dr. Johney Cook PRIMARY SERVICE: Surgery  CHIEF COMPLAINT:  Small bowel obstruction  BRIEF PATIENT DESCRIPTION:   69yom with PMH of NASH cirrhosis, CKD III, COPD presented on 05/09/2014 with worsening Abd distention and elevated ammonia.  He developed pneumatosis on Abd imaging prompting an emergent ex-lap on 4/17.  We were consulted for ICU comanagement.  SIGNIFICANT EVENTS / STUDIES:  04/20/2014:  Admitted with worsening ascites and abd distention 04/29/14:  MRI L spine with multiple subacute compression fx's 04/29/2014:  Developed ileus vs SBO with vomiting.  CT Abd/ pelvis concerning for pneumatosis.  Taken to OR for exlap.  Transferred to ICU. Had lysis of adhesions, but no evidence of spillage  4/18: on pressors, still w/ metabolic acidosis.  4/19 extubated.  4/20 therapeutic paracentesis 4/23 transferred to service or [facility] 4/23. Recurrent shock. Vasopressors resumed 4/24 PCCM asked back on. Made DNR - care limited to medical therapies, IVFs, nutrition. No re-intubation. No HD. Comfort is highest priority 4/25 Family decided to withdraw medical support   SUBJECTIVE:  Family discussed   VITAL SIGNS: Temp:  [97.4 F (36.3 C)-99.8 F (37.7 C)] 98.9 F (37.2 C) (04/25 0800) Pulse Rate:  [105-117] 110 (04/25 1000) Resp:  [13-32] 23 (04/25 1000) BP: (81-121)/(27-102) 95/44 mmHg (04/25 1030) SpO2:  [83 %-96 %] 92 % (04/25 1000) FiO2 (%):  [55 %] 55 % (04/25 0400) Weight:  [85.5 kg (188 lb 7.9 oz)] 85.5 kg (188 lb 7.9 oz) (04/25 0600)  4 liters HEMODYNAMICS: CVP:  [9 mmHg-16 mmHg] 14 mmHg VENTILATOR SETTINGS: Vent Mode:  [-]  FiO2 (%):  [55 %] 55 % INTAKE / OUTPUT: Intake/Output      04/24 0701 - 04/25 0700 04/25 0701 - 04/26 0700   I.V. (mL/kg) 578.1 (6.8) 110.3 (1.3)   Other     NG/GT 210.7 50   IV  Piggyback 550    Total Intake(mL/kg) 1338.7 (15.7) 160.3 (1.9)   Urine (mL/kg/hr) 650 (0.3) 150 (0.4)   Emesis/NG output     Stool     Total Output 650 150   Net +688.7 +10.3          PHYSICAL EXAMINATION: Gen: mild respiratory distress HENT: NCAT, MM dry PULM: Crackles bilaterally to 1/2 way  CV: Tachy, regular, no mgr GI: BS+, soft, nontender MSK: normal bulk and tone Derm: edema in hands, feet Neuro: sedated, minimal responsiveness to external stimuli  LABS:  CBC  Recent Labs Lab 05/07/14 0532 05/08/14 0450 05/09/14 0430  WBC 12.1* 12.5* 16.0*  HGB 8.5* 8.2* 8.6*  HCT 26.9* 25.9* 27.3*  PLT 140* 178 141*   Coag's  Recent Labs Lab 05/09/14 0430  INR 2.75*   BMET  Recent Labs Lab 05/07/14 2233 05/08/14 0450 05/09/14 0430  NA 140 143 143  K 4.4 4.4 4.4  CL 112 108 112  CO2 23 23 23   BUN 44* 44* 53*  CREATININE 1.74* 1.82* 1.84*  GLUCOSE 95 147* 152*   Electrolytes  Recent Labs Lab 05/07/14 2233 05/08/14 0450 05/09/14 0430  CALCIUM 7.8* 8.1* 7.7*  MG 2.1  --   --    Sepsis Markers No results for input(s): LATICACIDVEN, PROCALCITON, O2SATVEN in the last 168 hours. ABG  Recent Labs Lab 05/02/14 1200 05/02/14 2047 05/03/14 0445  PHART 7.423 7.465* 7.445  PCO2ART 34.8* 34.8* 36.6  PO2ART 110.0*  71.6* 90.9   Liver Enzymes  Recent Labs Lab 05/07/14 0532 05/09/14 0430  AST 39* 79*  ALT 18 19  ALKPHOS 107 111  BILITOT 3.2* 2.9*  ALBUMIN 2.1* 2.3*   Cardiac Enzymes No results for input(s): TROPONINI, PROBNP in the last 168 hours. Glucose  Recent Labs Lab 05/08/14 1227 05/08/14 1625 05/08/14 2003 05/08/14 2343 05/09/14 0344 05/09/14 0729  GLUCAP 133* 144* 138* 141* 143* 136*    Imaging Dg Chest Port 1 View  05/09/2014   CLINICAL DATA:  69 year old male with respiratory failure, sepsis, pneumonia. Initial encounter.  EXAM: PORTABLE CHEST - 1 VIEW  COMPARISON:  05/07/2014 and earlier.  FINDINGS: Two Portable AP semi  upright views of the chest at 0534 hours. Stable enteric tube and right IJ approach central line. Continued increased nodular and confluent bilateral pulmonary opacity, more involvement at both lung bases since 05/07/2014. The bilateral airspace disease has significantly progressed since 05/05/2014. No associated pneumothorax or large pleural effusion. Stable cardiac size and mediastinal contours.  IMPRESSION: 1.  Stable lines and tubes. 2. Progressed bilateral pneumonia or ARDS since 05/05/2014, with increased lung base involvement since 05/07/2014.   Electronically Signed   By: Clifford Ann M.D.   On: 05/09/2014 07:39   Dg Chest Port 1 View  05/07/2014   CLINICAL DATA:  Respiratory failure  EXAM: PORTABLE CHEST - 1 VIEW  COMPARISON:  05/05/2014  FINDINGS: NG tube and right jugular venous catheter are stable. Diffuse bilateral airspace disease is worse. No pneumothorax. Upper normal heart size.  IMPRESSION: Worsening diffuse bilateral airspace disease.   Electronically Signed   By: Clifford Killings M.D.   On: 05/07/2014 11:44    4/25 CXR: bilateral airspace disease, worse than yesterday   ASSESSMENT / PLAN:  PULMONARY A: Acute resp failure with hypoxemia > worsening, due to pulm edema, ARDS, HCAP H/o COPD Pulmonary edema P:   Cont supplemental O2 DNI PRN opioids for intractable dyspnea Lasix x2 doses today  CARDIOVASCULAR A: Circulatory shock > most likely septic complicated by cirrhosis P:   Cont vasopressors to maintain MAP > 65 mmHg Discussed with family, do not escalate vasopressor  RENAL A: Oliguric acute kidney injury > worsening Hyponatremia, resolved P:   Not an HD candidate Monitor BMET and UOP Replace electrolytes as needed  GASTROINTESTINAL A: SBO> s/p ex-lap w/ lysis of adhesions Prolonged post op ileus P:   SUP: N/I Begin trickle TFs 4/24 Lactulose x1 dose  HEMATOLOGIC A: Anemia without acute blood loss P:   DVT px: LMWH Monitor CBC  intermittently  INFECTIOUS A: Sepsis due to HCAP P:   Blood 4/23 GPC in pairs and chains 1/2  Cont current abx Imipenem 4/20 >>  Vanc 4/24 >>   ENDOCRINE A: Hyperglycemia without prior dx of DM P:   CBG q 8 hrs SSI not presently indicated   NEUROLOGIC A: Post-op pain  Acute on chronic back pain due to compression fractures Acute enephalopathy likely due to lactulose P:   Cont PRN opioids > adjust as needed   Global: given his multiple comorbid illnesses, the severity of the current illnesses as described above and his multi-organ failure, he will not survive this illness.  Multiple lengthy conversations held repeatedly with family this morning.  They are struggling to decide whether or not to withdraw care.  His wife states that she feels he would not want to continue this way, but his children wish to give him more time.  Total time assessing Mr. Pascucci, reviewing  the chart, adjusting medications, discussing with nursing, and having multiple lengthy conversations with family this morning was 91 minutes of my critical care time.  Roselie Awkward, MD [facility] Pager: [phone] Cell: [phone] If no response, call [phone]

## 2014-05-09 NOTE — Progress Notes (Signed)
8 Days Post-Op  Subjective: He is not responding to the family, there are 4 of them in the room.  Son asking about steroids for the lungs, but I told him we would defer that to medicine.  He does have some bowel sounds, not allot of residual, no BM.  Wound is OK, some serous drainage at the base end.  Objective: Vital signs in last 24 hours: Temp:  [97.4 F (36.3 C)-99.8 F (37.7 C)] 98.9 F (37.2 C) (04/25 0800) Pulse Rate:  [105-117] 106 (04/25 0800) Resp:  [13-32] 17 (04/25 0800) BP: (81-121)/(27-102) 115/39 mmHg (04/25 0830) SpO2:  [83 %-96 %] 94 % (04/25 0800) FiO2 (%):  [55 %] 55 % (04/25 0400) Weight:  [85.5 kg (188 lb 7.9 oz)] 85.5 kg (188 lb 7.9 oz) (04/25 0600) Last BM Date: 05/07/14 210 in from NG, nothing out recorded. No BM recorded TM 99.8 BP down.  RR up some yesterday, on Venturi mask most of the day CVP running 9-16 thru the day yesterday/  11 this AM. Creatinine is up, Bilirubin is up, WBC is up and PT is up INR 2.75 CXR show progressive pneumonia/ARDS Intake/Output from previous day: 04/24 0701 - 04/25 0700 In: 1338.7 [I.V.:578.1; NG/GT:210.7; IV Piggyback:550] Out: 650 [Urine:650] Intake/Output this shift: Total I/O In: 112.1 [I.V.:62.1; NG/GT:50] Out: -   General appearance: he is not responding to any one, eyes closed, no distress. Resp:  Congested cough, rales and ronchi GI: distended, + BS today, wound OK  Lab Results:   Recent Labs  05/08/14 0450 05/09/14 0430  WBC 12.5* 16.0*  HGB 8.2* 8.6*  HCT 25.9* 27.3*  PLT 178 141*    BMET  Recent Labs  05/08/14 0450 05/09/14 0430  NA 143 143  K 4.4 4.4  CL 108 112  CO2 23 23  GLUCOSE 147* 152*  BUN 44* 53*  CREATININE 1.82* 1.84*  CALCIUM 8.1* 7.7*   PT/INR  Recent Labs  05/09/14 0430  LABPROT 29.3*  INR 2.75*     Recent Labs Lab 05/07/14 0532 05/09/14 0430  AST 39* 79*  ALT 18 19  ALKPHOS 107 111  BILITOT 3.2* 2.9*  PROT 4.7* 4.8*  ALBUMIN 2.1* 2.3*     Lipase    Component Value Date/Time   LIPASE 101* 04/25/2014 1222     Studies/Results: Dg Chest Port 1 View  05/09/2014   CLINICAL DATA:  69 year old male with respiratory failure, sepsis, pneumonia. Initial encounter.  EXAM: PORTABLE CHEST - 1 VIEW  COMPARISON:  05/07/2014 and earlier.  FINDINGS: Two Portable AP semi upright views of the chest at 0534 hours. Stable enteric tube and right IJ approach central line. Continued increased nodular and confluent bilateral pulmonary opacity, more involvement at both lung bases since 05/07/2014. The bilateral airspace disease has significantly progressed since 05/05/2014. No associated pneumothorax or large pleural effusion. Stable cardiac size and mediastinal contours.  IMPRESSION: 1.  Stable lines and tubes. 2. Progressed bilateral pneumonia or ARDS since 05/05/2014, with increased lung base involvement since 05/07/2014.   Electronically Signed   By: Genevie Ann M.D.   On: 05/09/2014 07:39   Dg Chest Port 1 View  05/07/2014   CLINICAL DATA:  Respiratory failure  EXAM: PORTABLE CHEST - 1 VIEW  COMPARISON:  05/05/2014  FINDINGS: NG tube and right jugular venous catheter are stable. Diffuse bilateral airspace disease is worse. No pneumothorax. Upper normal heart size.  IMPRESSION: Worsening diffuse bilateral airspace disease.   Electronically Signed   By: Arnell Sieving  Hoss M.D.   On: 05/07/2014 11:44    Medications: . antiseptic oral rinse  7 mL Mouth Rinse QID  . bisacodyl  10 mg Rectal Daily  . chlorhexidine  15 mL Mouth Rinse BID  . enoxaparin (LOVENOX) injection  40 mg Subcutaneous Q24H  . feeding supplement (VITAL AF 1.2 CAL)  1,000 mL Per Tube Q24H  . imipenem-cilastatin  500 mg Intravenous 3 times per day  . lip balm  1 application Topical BID  . pantoprazole (PROTONIX) IV  40 mg Intravenous QHS  . sodium chloride  1,000 mL Intravenous Once  . vancomycin  1,250 mg Intravenous Q24H    Assessment/Plan 1. SBO with probable, pneumatosis. EXPLORATORY LAPAROTOMY,  LAPAROSCOPIC LYSIS OF ADHESIONS, PARACENTESIS x 3L, 04/30/2014,Dr. Alwyn Pea 2. Post op ileus vs recurrent SBO. 3. 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 Hx Elevated ammonia and encephalopathy 5. RUL/RML infiltrate/HCAP (extubated 05/03/14)  Film today shows  -  Pneumonia/ARDS 6. Atrial fibrillation 7. Hx of stage III renal disease 8. Hx of respiratory failure 9. Hyponatremia 10. Chronic back pain  11. DVT: Lovenox/SCD  INR 2.75  ? Hold lovenox?  12. Day 5 Imipenem-cilastatin (3 days of Vancomycin completed 4/19,/4 days of rifaximin completed 4/16.) 13. Deconditioning and malnutrition - Prealbumin <4 05/05/14 14.  Progressive encephalopathy 15.  Elevated INR with ongoing Lovenox for DVT 16.   Progressive renal failure  -  Creatinine up to 1.84   Plan:  From our standpoint not allot to add.  Cirrhosis, Pneumonia, auto anti coagulation, renal failure and encephalopathy are the main issues.  They are attempting TF and family says someone has ordered a suppository.  He has some bowel sounds so hopefully the ileus is improving.  He is currently comfort care, DNR, no ACLS, NO Re intubation.     LOS: 12 days    Najma Bozarth 05/09/2014

## 2014-05-09 NOTE — Progress Notes (Signed)
After lengthy conversation with patients family and answering numerous questions, family has decided to make patient full comfort care. Family states, "We want to turn of levophed and just focus on morphine and comfort. It's what he wanted and we gave it a good shot but see no road to recovery where he would be happy." Dr. Oletta Darter notified. Will focus on morphine and slowly titrate levo off. Family wants NG tube and wires off patient. Nurse will remove both. Chaplin paged. Morphine will be given for comfort. Will monitor patient for signs of distress and pain.

## 2014-05-10 DIAGNOSIS — Z515 Encounter for palliative care: Secondary | ICD-10-CM

## 2014-05-10 LAB — GLUCOSE, CAPILLARY: GLUCOSE-CAPILLARY: 147 mg/dL — AB (ref 70–99)

## 2014-05-11 ENCOUNTER — Telehealth: Payer: Self-pay

## 2014-05-11 NOTE — Telephone Encounter (Signed)
Received death certificate from [employer] on 05-11-2014. Called Doctor McQuaid to see if he would be available to sign death certifcate and he said he would be... He said he was on 2nd floor of [place] at [facility]. I am taking it upstairs for Doctor McQuaid to sign the certificate.

## 2014-05-11 NOTE — Telephone Encounter (Signed)
error 

## 2014-05-12 LAB — CULTURE, BLOOD (ROUTINE X 2)

## 2014-05-14 LAB — CULTURE, BLOOD (ROUTINE X 2): CULTURE: NO GROWTH

## 2014-05-15 NOTE — Progress Notes (Signed)
9 Days Post-Op  Subjective: Comfort care, he is not responding.  Son and daughter are in the room.   Objective: Vital signs in last 24 hours: Temp:  [99 F (37.2 C)-100 F (37.8 C)] 99.2 F (37.3 C) (04/25 2000) Pulse Rate:  [80-138] 104 (04/26 0800) Resp:  [8-23] 8 (04/26 0800) BP: (90-122)/(36-57) 90/44 mmHg (04/25 2100) SpO2:  [91 %-95 %] 91 % (04/25 2100) Last BM Date: 05/07/14 Comfort care,  No labs Intake/Output from previous day: 04/25 0701 - 04/26 0700 In: 1484.3 [I.V.:594.3; NG/GT:340; IV Piggyback:550] Out: 0086 [Urine:1195] Intake/Output this shift: Total I/O In: -  Out: 60 [Urine:60]  General appearance: not responsive, being kept comfortable by staff, family in room with him.  Lab Results:   Recent Labs  05/08/14 0450 05/09/14 0430  WBC 12.5* 16.0*  HGB 8.2* 8.6*  HCT 25.9* 27.3*  PLT 178 141*    BMET  Recent Labs  05/08/14 0450 05/09/14 0430  NA 143 143  K 4.4 4.4  CL 108 112  CO2 23 23  GLUCOSE 147* 152*  BUN 44* 53*  CREATININE 1.82* 1.84*  CALCIUM 8.1* 7.7*   PT/INR  Recent Labs  05/09/14 0430  LABPROT 29.3*  INR 2.75*     Recent Labs Lab 05/07/14 0532 05/09/14 0430  AST 39* 79*  ALT 18 19  ALKPHOS 107 111  BILITOT 3.2* 2.9*  PROT 4.7* 4.8*  ALBUMIN 2.1* 2.3*     Lipase     Component Value Date/Time   LIPASE 101* 04/25/2014 1222     Studies/Results: Dg Chest Port 1 View  05/09/2014   CLINICAL DATA:  69 year old male with respiratory failure, sepsis, pneumonia. Initial encounter.  EXAM: PORTABLE CHEST - 1 VIEW  COMPARISON:  05/07/2014 and earlier.  FINDINGS: Two Portable AP semi upright views of the chest at 0534 hours. Stable enteric tube and right IJ approach central line. Continued increased nodular and confluent bilateral pulmonary opacity, more involvement at both lung bases since 05/07/2014. The bilateral airspace disease has significantly progressed since 05/05/2014. No associated pneumothorax or large pleural  effusion. Stable cardiac size and mediastinal contours.  IMPRESSION: 1.  Stable lines and tubes. 2. Progressed bilateral pneumonia or ARDS since 05/05/2014, with increased lung base involvement since 05/07/2014.   Electronically Signed   By: Genevie Ann M.D.   On: 05/09/2014 07:39    Medications: . antiseptic oral rinse  7 mL Mouth Rinse QID  . atropine  2 drop Sublingual TID  . bisacodyl  10 mg Rectal Daily  . chlorhexidine  15 mL Mouth Rinse BID  . imipenem-cilastatin  500 mg Intravenous 3 times per day  . lip balm  1 application Topical BID  . pantoprazole (PROTONIX) IV  40 mg Intravenous QHS  . sodium chloride  1,000 mL Intravenous Once  . vancomycin  1,250 mg Intravenous Q24H    Assessment/Plan 1. SBO with probable, pneumatosis. EXPLORATORY LAPAROTOMY, LAPAROSCOPIC LYSIS OF ADHESIONS, PARACENTESIS x 3L, 05/02/2014,Dr. Alwyn Pea 2. Post op ileus vs recurrent SBO. 3. 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 Hx Elevated ammonia and encephalopathy 5. RUL/RML infiltrate/HCAP (extubated 05/03/14) Film today shows - Pneumonia/ARDS 6. Atrial fibrillation 7. Hx of stage III renal disease 8. Hx of respiratory failure 9. Hyponatremia 10. Chronic back pain  11. DVT: Lovenox/SCD INR 2.75 ? Hold lovenox?  12. Day 5 Imipenem-cilastatin (3 days of Vancomycin completed 4/19,/4 days of rifaximin completed 4/16.) 13. Deconditioning and malnutrition - Prealbumin <  4 05/05/14 14. Progressive encephalopathy 15. Elevated INR with ongoing Lovenox for DVT 16. Progressive renal failure - Creatinine up to 1.84 17.  He is now on comfort care/not responding       LOS: 13 days    Clifford Cook 2014-05-18

## 2014-05-15 NOTE — Progress Notes (Signed)
Patient found to have passed at 1246. Confirmed by Park Breed, RN. Family present at bedside. MD made aware.

## 2014-05-15 NOTE — Progress Notes (Addendum)
Family requesting blood vials to be drawn to send for genetic testing on their own after patient's passing. Per Dr. Lake Bells RN may draw labs necessary. 3 purple top tubes drawn, labeled, and given to family to send for testing. Clifford Cook, patient's spouse, signed form acknowledging the transfer of the blood to the family's possession. Document filed in paper chart.

## 2014-05-15 NOTE — Progress Notes (Signed)
Pt's wife, son, daughter, daughter-in-law, and close family friend were bedside when I arrived. They were tearful, but able to talk and articulate their feelings of shock and unbelief. Mrs. Luciana Axe frequently asked what will she do tomorrow. In and out their grieving, they enjoyed sharing memories and laughter was also a huge part of processing. During the course of our conversation, Mrs. Greenslade and I discovered we were former co-workers. She asked me if we could go to a conference room to discuss, what turned out to be, the next steps after pt passes. I will give information to nurse. Pt's wife said prayer would be helpful and we had prayer for family bedside of patient. Please page if anything changes or if family needs additional assistance. 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 Ernest Haber Chaplain   06-08-2014 0100  Clinical Encounter Type  Visited With Family

## 2014-05-15 NOTE — Progress Notes (Addendum)
PULMONARY  / CRITICAL CARE MEDICINE CONSULTATION   Name: Clifford Cook MRN: [id number] DOB: [birth year]    ADMISSION DATE:  05/09/2014 CONSULTATION DATE: 05/02/14  REQUESTING CLINICIAN: Dr. Johney Maine PRIMARY SERVICE: Surgery  CHIEF COMPLAINT:  Small bowel obstruction  BRIEF PATIENT DESCRIPTION:  31 yom with PMH of NASH cirrhosis, CKD III, COPD presented on 05/13/2014 with worsening Abd distention and elevated ammonia.  He developed pneumatosis on Abd imaging prompting an emergent ex-lap on 4/17.  We were consulted for ICU comanagement.  SIGNIFICANT EVENTS / STUDIES:  4/13  Admitted with worsening ascites and abd distention 4/15  MRI L spine with multiple subacute compression fx's 4/17  Developed ileus vs SBO with vomiting.  CT Abd/ pelvis concerning for pneumatosis.  Taken to OR for exlap.  Transferred to ICU. Had lysis of adhesions, but no evidence of spillage  4/18  on pressors, still w/ metabolic acidosis.  4/19  extubated.  4/20  therapeutic paracentesis 4/23  transferred to service or TRH 4/23  Recurrent shock. Vasopressors resumed 4/24  PCCM asked back on. Made DNR - care limited to medical therapies, IVFs, nutrition. No re-intubation. No HD. Comfort is highest priority 4/25  Family decided to withdraw medical support   SUBJECTIVE: Events noted from overnight, family requested to proceed with comfort care.    VITAL SIGNS: Temp:  [99 F (37.2 C)-100 F (37.8 C)] 99.1 F (37.3 C) (04/26 0836) Pulse Rate:  [80-138] 104 (04/26 0800) Resp:  [8-23] 8 (04/26 0800) BP: (90-122)/(36-57) 90/44 mmHg (04/25 2100) SpO2:  [91 %-95 %] 91 % (04/25 2100)  4 liters  HEMODYNAMICS: CVP:  [10 mmHg-16 mmHg] 16 mmHg  VENTILATOR SETTINGS:    INTAKE / OUTPUT: Intake/Output      04/25 0701 - 04/26 0700 04/26 0701 - 04/27 0700   I.V. (mL/kg) 594.3 (7)    NG/GT 340    IV Piggyback 550    Total Intake(mL/kg) 1484.3 (17.4)    Urine (mL/kg/hr) 1195 (0.6) 60 (0.4)   Total Output 1195 60   Net  +289.3 -60          PHYSICAL EXAMINATION: Gen: chronically ill appearing male, agonal respirations, appears comfortable HENT: NCAT, MM dry PULM: agonal respirations, appears peaceful, lungs coarse with rhonchi CV: s1s2 rrr GI: BS+, soft, nontender MSK: no acute deformities Derm: edema in hands, feet Neuro: obtunded, no distress  LABS:  CBC  Recent Labs Lab 05/07/14 0532 05/08/14 0450 05/09/14 0430  WBC 12.1* 12.5* 16.0*  HGB 8.5* 8.2* 8.6*  HCT 26.9* 25.9* 27.3*  PLT 140* 178 141*   Coag's  Recent Labs Lab 05/09/14 0430  INR 2.75*   BMET  Recent Labs Lab 05/07/14 2233 05/08/14 0450 05/09/14 0430  NA 140 143 143  K 4.4 4.4 4.4  CL 112 108 112  CO2 23 23 23   BUN 44* 44* 53*  CREATININE 1.74* 1.82* 1.84*  GLUCOSE 95 147* 152*   Glucose  Recent Labs Lab 05/08/14 2003 05/08/14 2343 05/09/14 0344 05/09/14 0729 05/09/14 1159 05/09/14 1607  GLUCAP 138* 141* 143* 136* 138* 155*    Imaging CXR images personally reviewed> bilateral airspace disease worsening  ASSESSMENT / PLAN:  PULMONARY A: Acute resp failure with hypoxemia > worsening, due to pulm edema, ARDS, HCAP H/o COPD Pulmonary edema Terminal Care / Wean  P:   Cont supplemental O2 DNR / DNI  PRN opioids for intractable dyspnea  CARDIOVASCULAR A: Circulatory shock > most likely septic complicated by cirrhosis P:   DNR, no  escalation of care   RENAL A: Oliguric acute kidney injury > worsening Hyponatremia, resolved P:   No further labs No HD  GASTROINTESTINAL A: SBO> s/p ex-lap w/ lysis of adhesions Prolonged post op ileus P:   Comfort feeding only if patient requests  HEMATOLOGIC A: Anemia without acute blood loss P:   D/C further labs  INFECTIOUS A: Sepsis due to HCAP P:   Blood 4/23 GPC in pairs and chains 1/2 >>  Imipenem 4/20 >> 4/26 Vanc 4/24 >> 4/26  ENDOCRINE A: Hyperglycemia without prior dx of DM P:   Discontinue SSI  Comfort  care  NEUROLOGIC A: Post-op pain  Acute on chronic back pain due to compression fractures Acute enephalopathy likely due to lactulose P:   Cont PRN opioids > adjust as needed    GLOBAL:  Family requested comfort care / withdrawal of support overnight.  Patient appears comfortable.  PRN morphine available.  Family updated at bedside.  Expect hours to days.   Noe Gens, NP-C [facility] Pgr: [phone] or [phone]  Attending:  I have seen and examined the patient with nurse practitioner/resident and agree with the note above.   On my exam Clifford Cook is peaceful this morning. He is in no respiratory distress, lungs with crackles/rhonchi bilaterally.    He is actively dying, we are taking a full comfort measures approach. Will maintain prn opiates at current dose, continue mouth care, spiritual care for family.    Discussed case with surgery yesterday.  Vitals, labs, imaging all reviewed.  Family updated by me personally at bedside around midnight last night when we made the decision to move towards full comfort measures and again this morning.  Maintain current management.  Roselie Awkward, MD [facility] Pager: [phone] Cell: [phone] If no response, call [phone]

## 2014-05-15 DEATH — deceased

## 2014-06-23 NOTE — Discharge Summary (Signed)
Physician Death Summary  Patient ID: Clifford Cook MRN: [id number] DOB/AGE: 70-Nov-1947 70 y.o.  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 male, with past medical history of [place], cirrhosis, anemia and thrombocytopenia, presents with multiple complaints later to generalized weakness, abdominal pain, lower back pain, patient denies any hematemesis, diarrhea, fever or chills, reports abdominal pain is chronic, afebrile in ED, patient workup was significant for elevated ammonia level at 103, patient reports he has not been taking his lactulose recently given he was not feeling well, reports he usually takes it 1-2 times daily, with good bowel movements, but no bowel movements over the last 48 hours, denies any lethargy, confusion or altered mental status, is one dose of lactulose in ED and hospitalist requested to admit for further management of hyperammonemia, agent has multiple abnormality which is at his baseline including anemia with hemoglobin of 8, thrombocytopenia with platelet of 97.  Hospital Course:  41 yom with PMH of NASH cirrhosis, CKD III, COPD presented on 05-06-2014 with worsening Abd distention and elevated ammonia. He developed  pneumatosis on Abd imaging prompting an emergent ex-lap on 4/17.   SIGNIFICANT EVENTS / STUDIES:  2022-05-06 Admitted with worsening ascites and abd distention 4/15 MRI L spine with multiple subacute compression fx's 4/17 Developed ileus vs SBO with vomiting. CT Abd/ pelvis concerning for pneumatosis. Taken to OR for exlap. Transferred to ICU. Had lysis of adhesions, but no evidence of spillage  4/18 on pressors, still w/ metabolic acidosis.  4/19 extubated.  4/20 therapeutic paracentesis 4/23 transferred to service or TRH 4/23 Recurrent shock. Vasopressors resumed 4/24 PCCM asked back on. Made DNR - care limited to medical therapies, IVFs, nutrition. No re-intubation. No HD. Comfort is highest priority 4/25 Family decided to withdraw medical support  Acute resp failure with hypoxemia > worsening, due to pulm edema, ARDS, HCAP H/o COPD Pulmonary edema   CARDIOVASCULAR Circulatory shock > most likely septic complicated by cirrhosis  RENAL A: Oliguric acute kidney injury > worsening Hyponatremia, resolved  GASTROINTESTINAL A: SBO> s/p ex-lap w/ lysis of adhesions Prolonged post op ileus   HEMATOLOGIC A: Anemia without acute blood loss   INFECTIOUS A: Sepsis due to HCAP P:  Blood 4/23 GPC in pairs and chains 1/2 >>  Imipenem 4/20 >> 05-19-22 Vanc 4/24 >> May 19, 2022  ENDOCRINE A: Hyperglycemia without prior dx of DM   NEUROLOGIC A: Post-op pain  Acute on chronic back pain due to compression fractures Acute enephalopathy likely due to lactulose  For more detailed description please see patient's medical record.  I was a consulting physician, not the attending for this pt.    Disposition: 20-Expired  [name]. Redmond Pulling, MD, FACS General, Bariatric, & Minimally Invasive Surgery [facility], [place]   Signed: Gayland Curry 06/23/2014,  2:38 PM

## 2015-08-07 IMAGING — CR DG CHEST 2V
2 series · 2 of 2 positions shown · non-contrast
Comparison: November 04, 2013

CLINICAL DATA: Dyspnea ; bilateral lower extremity edema

EXAM:
CHEST  2 VIEW

[w chest pa]
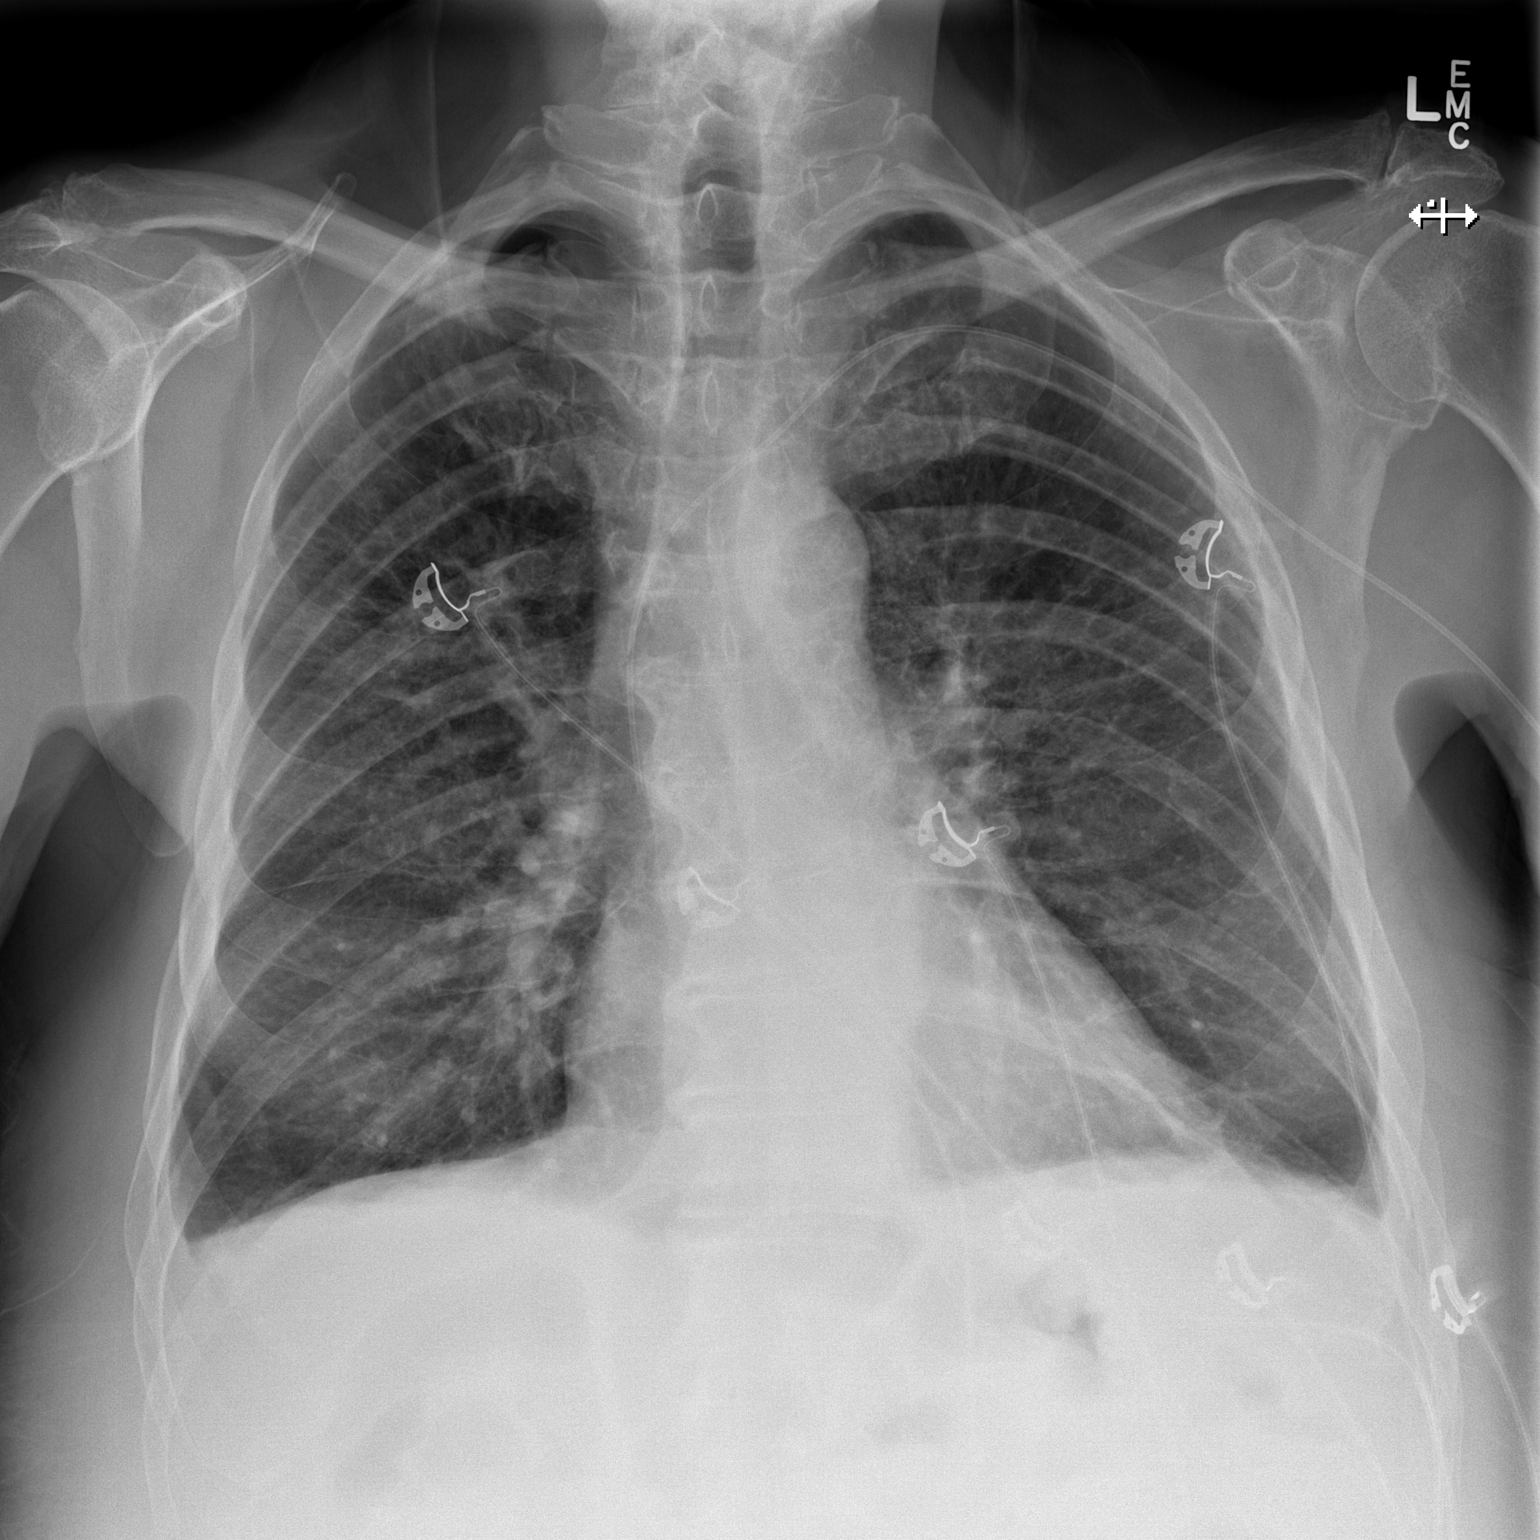

[w chest lat]
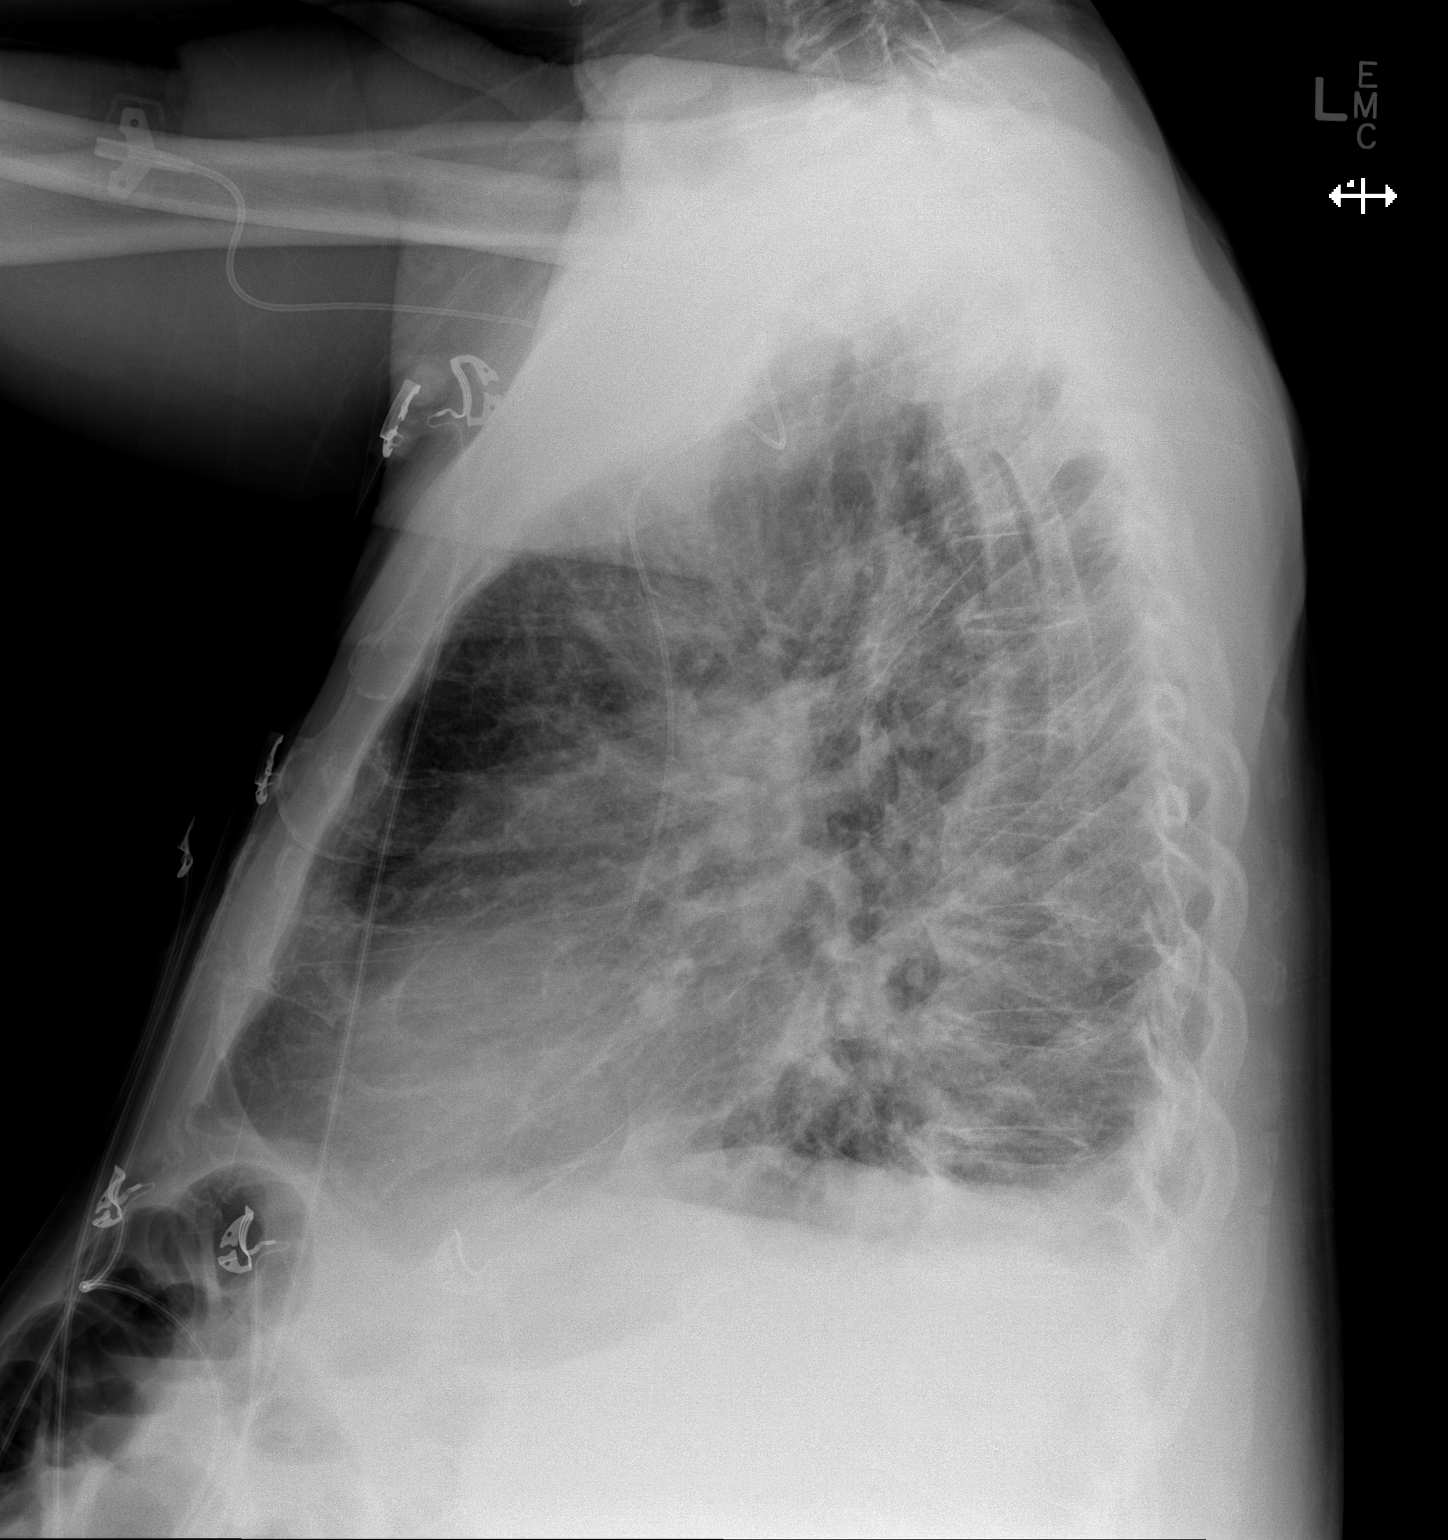

[2 of 2 positions shown; findings below may reference images not displayed]

FINDINGS: There has been marked interval resolution of pulmonary edema.
Currently there is evidence of underlying emphysema with scattered
areas of scarring bilaterally. There is currently trace interstitial
edema and no consolidation. Heart is upper normal in size. The
pulmonary vascularity reflects underlying emphysema. There is a
minimal right effusion. No adenopathy. Central catheter tip in
superior vena cava. No pneumothorax.
IMPRESSION: Significant interval resolution of pulmonary edema compared to prior
study. Currently there is trace edema with heart upper normal in
size and minimal right effusion. There is felt to be mild congestive
heart failure superimposed on emphysema currently. No consolidation.
Central catheter tip in superior vena cava. No pneumothorax.

## 2015-08-17 IMAGING — CR DG CHEST 1V PORT
1 series · 2 of 2 positions shown · non-contrast
Comparison: PA and lateral chest 11/15/2013.

CLINICAL DATA: Weakness and shortness of breath.

EXAM:
PORTABLE CHEST - 1 VIEW

[Series 1: AP · U · 2 of 2 slices shown]
[im 1/2]
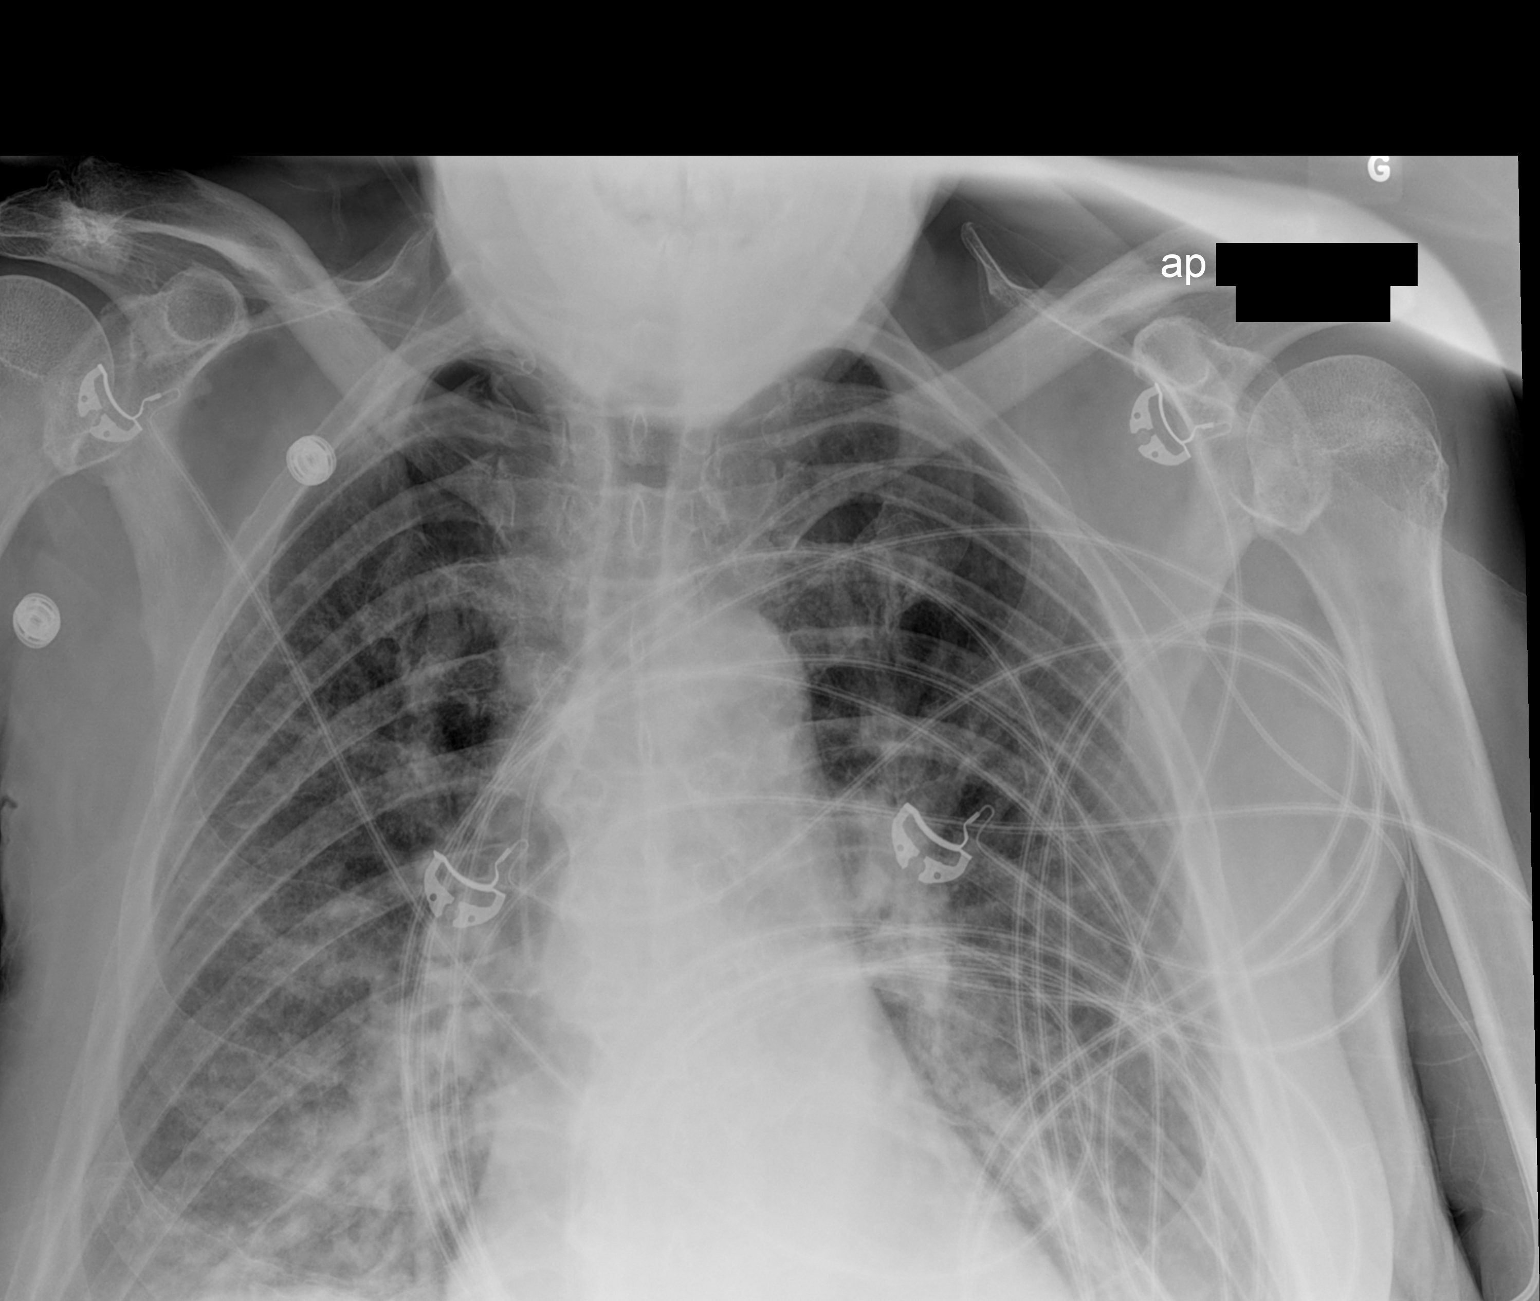
[im 2/2]
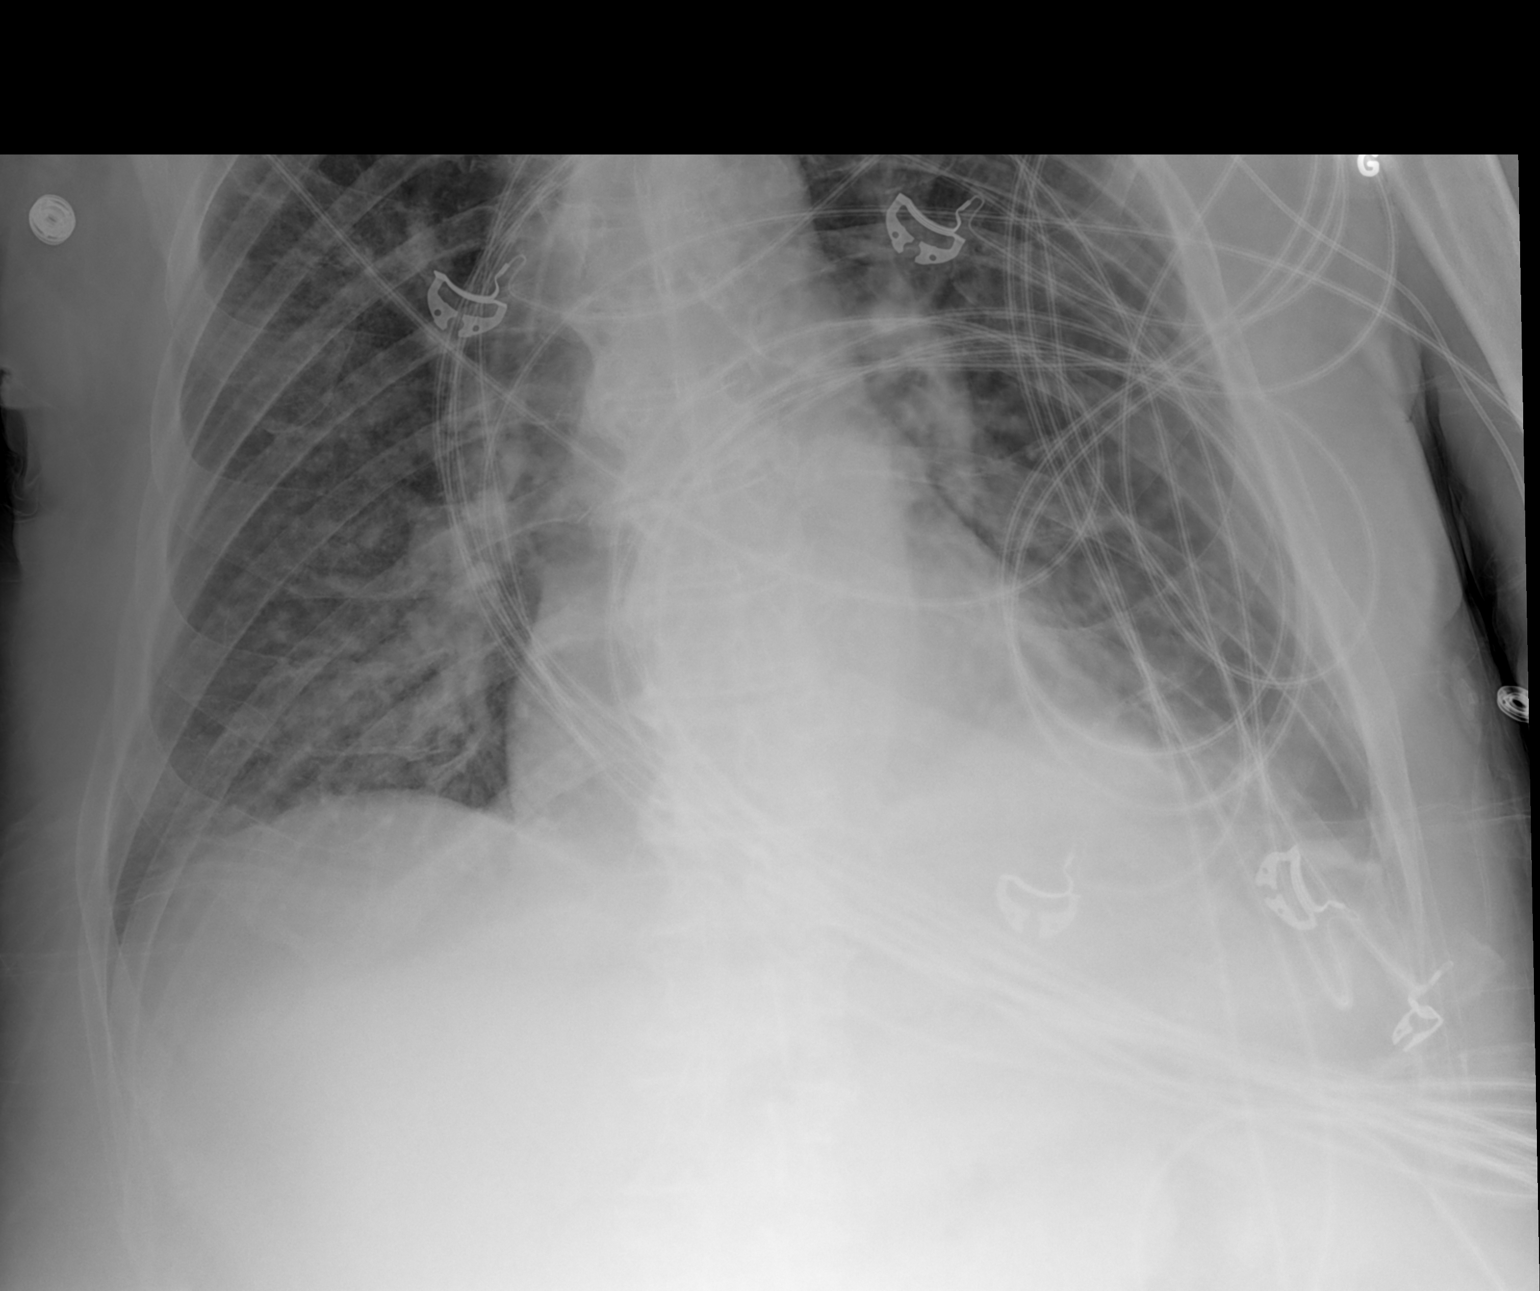

[2 of 2 positions shown; findings below may reference images not displayed]

FINDINGS: Small bilateral pleural effusions on the comparison study of
increased. Bilateral interstitial opacities are identified. No
pneumothorax is seen. Heart size is normal.
IMPRESSION: Bilateral interstitial opacities could be due to edema or infection.

Some increase in small bilateral pleural effusions, greater on the
left.

## 2015-08-17 IMAGING — US US PARACENTESIS
1 series · 5 of 5 positions shown · non-contrast
Comparison: Previous paracentesis

CLINICAL DATA: Cirrhosis, recurrent ascites. Request is made for
therapeutic paracentesis.

EXAM:
ULTRASOUND GUIDED PARACENTESIS

[Series 1: us paracentesis · 0.27mm/px · 5 of 5 slices shown]
[im 1/5]
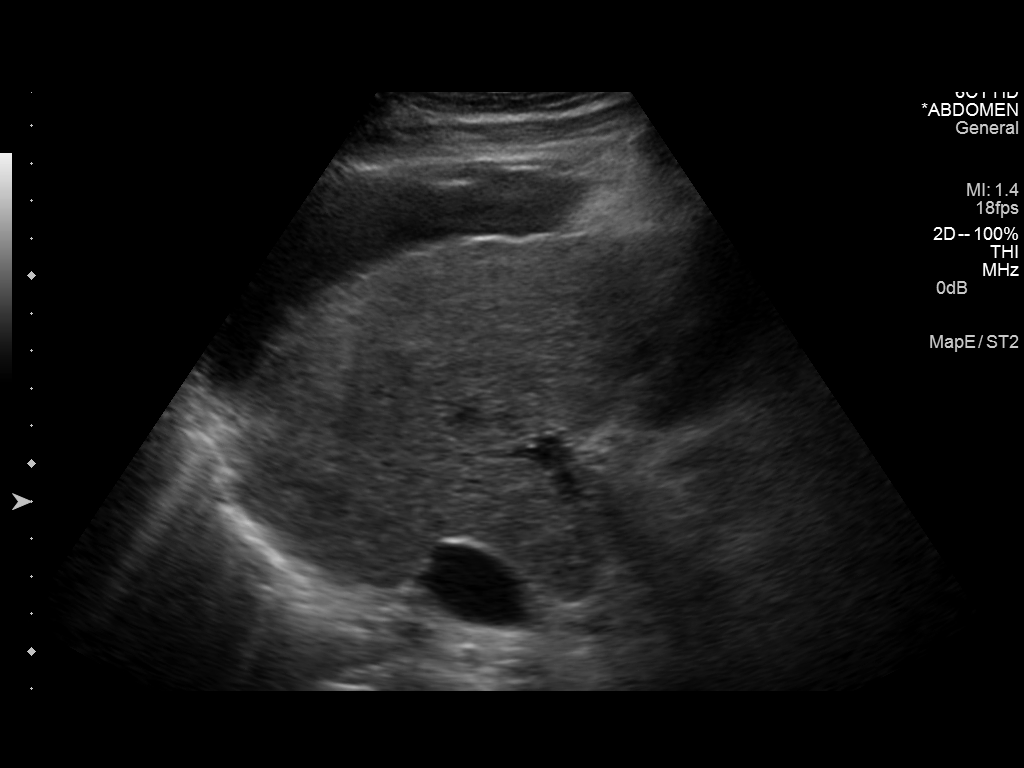
[im 2/5]
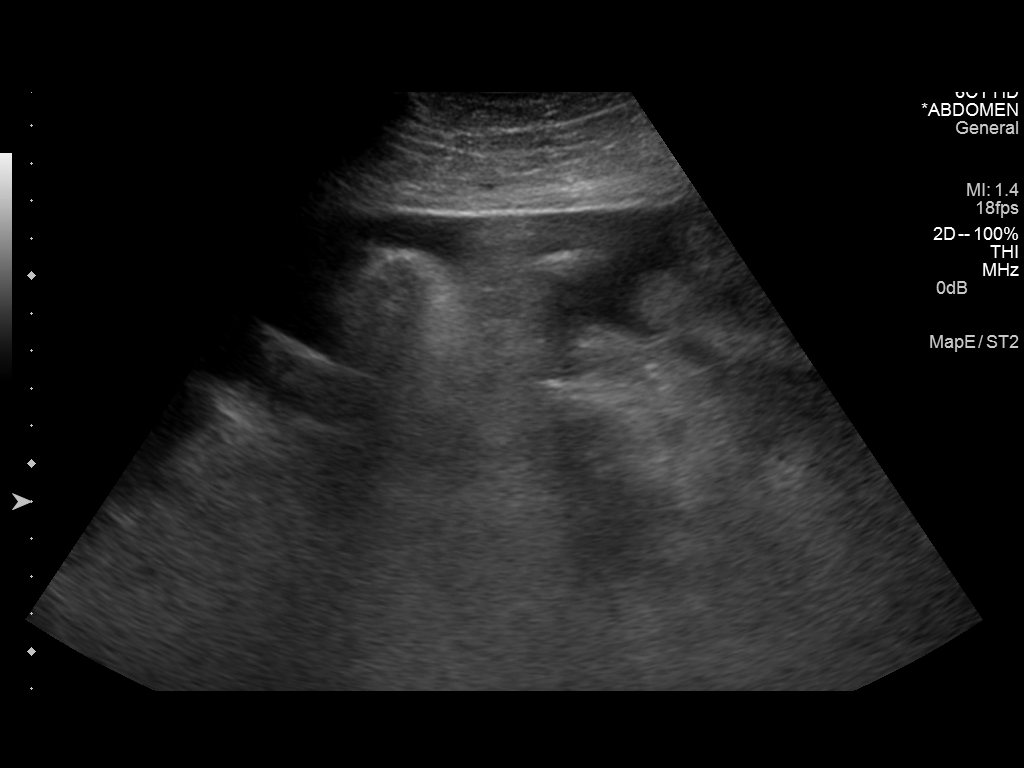
[im 3/5]
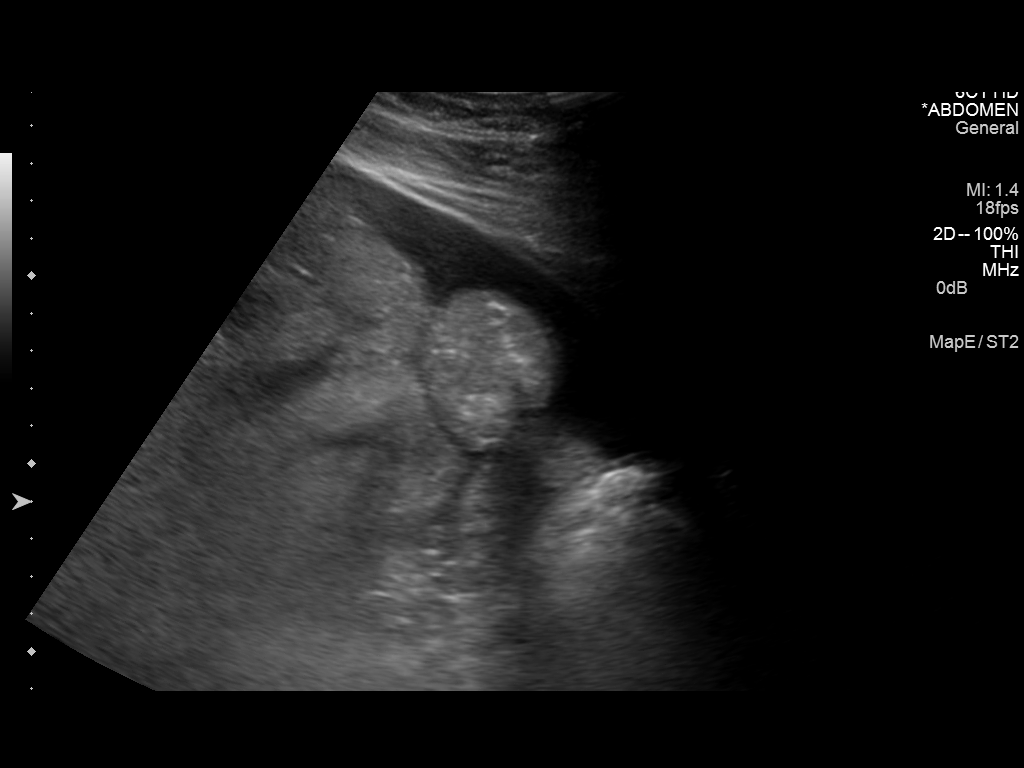
[im 4/5]
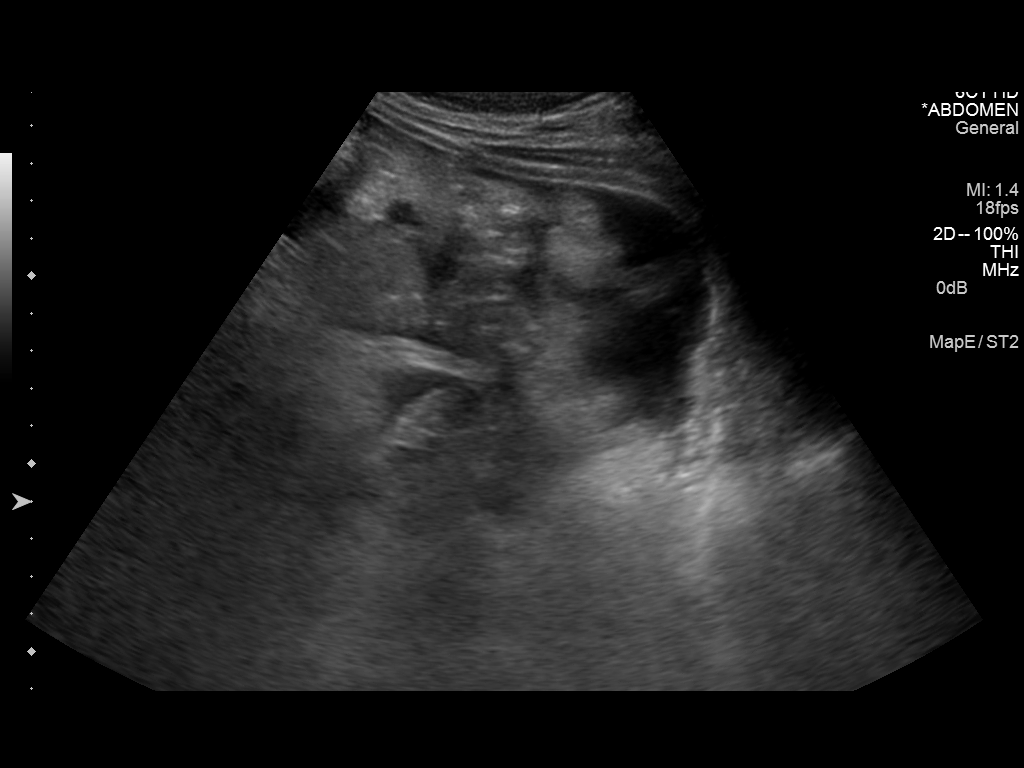
[im 5/5]
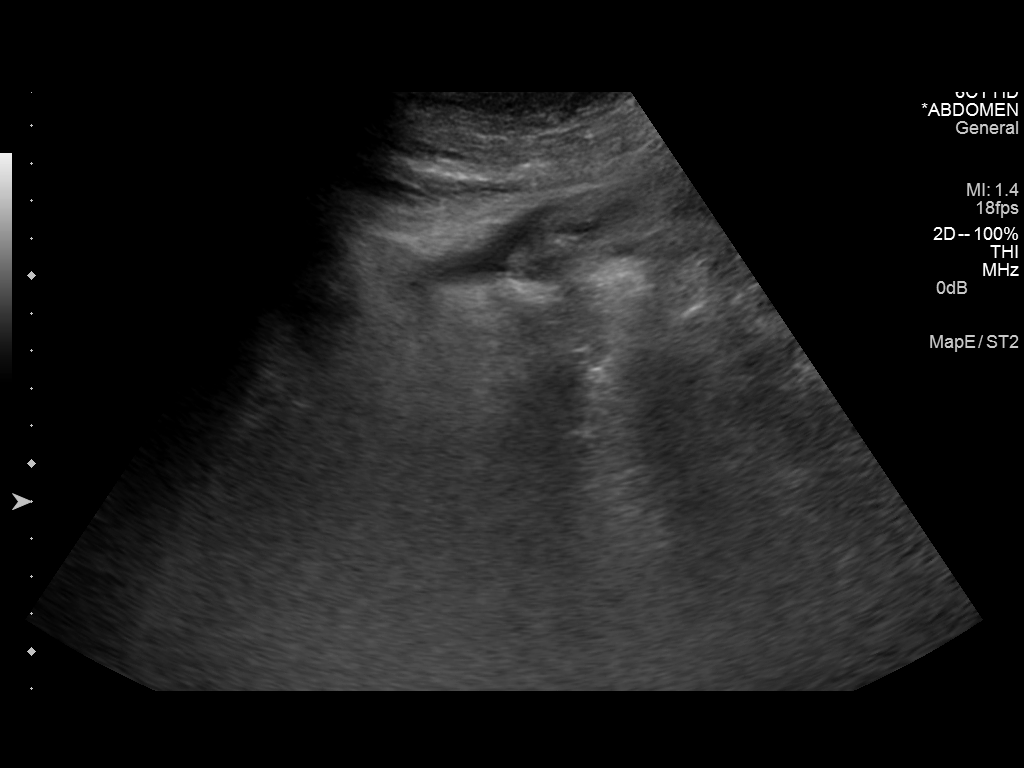

[5 of 5 positions shown; findings below may reference images not displayed]

PROCEDURE:
An ultrasound guided paracentesis was thoroughly discussed with the
patient and questions answered. The benefits, risks, alternatives
and complications were also discussed. The patient understands and
wishes to proceed with the procedure. Written consent was obtained.

Ultrasound was performed to localize and mark an adequate pocket of
fluid in the right lower quadrant of the abdomen. The area was then
prepped and draped in the normal sterile fashion. 1% Lidocaine was
used for local anesthesia. Under ultrasound guidance a 6 French
Safe-T-Centesis catheter was introduced. Paracentesis was performed.
The catheter was removed and a dressing applied. Dermabond was used
to form a puncture site seal, as the patient has significant soft
tissue edema that is weeping.

COMPLICATIONS:
None.
FINDINGS: A total of approximately 1.7 L of clear yellow fluid was removed. A
fluid sample was not sent for laboratory analysis.
IMPRESSION: Successful ultrasound guided paracentesis yielding 1.7 L of ascites.

## 2016-01-19 IMAGING — US US PARACENTESIS
1 series · 8 of 8 positions shown · non-contrast
Comparison: Prior paracentesis on 03/17/2014

MEDICATIONS:
None.

COMPLICATIONS:
None immediate

INDICATION: Cirrhosis, recurrent ascites. Request is made for diagnostic and
therapeutic paracentesis.

EXAM:
ULTRASOUND-GUIDED DIAGNOSTIC AND THERAPEUTIC PARACENTESIS
TECHNIQUE: Informed written consent was obtained from the patient after a
discussion of the risks, benefits and alternatives to treatment. A
timeout was performed prior to the initiation of the procedure.

[Series 1: us paracentesis · 0.27mm/px · 8 of 8 slices shown]
[im 1/8]
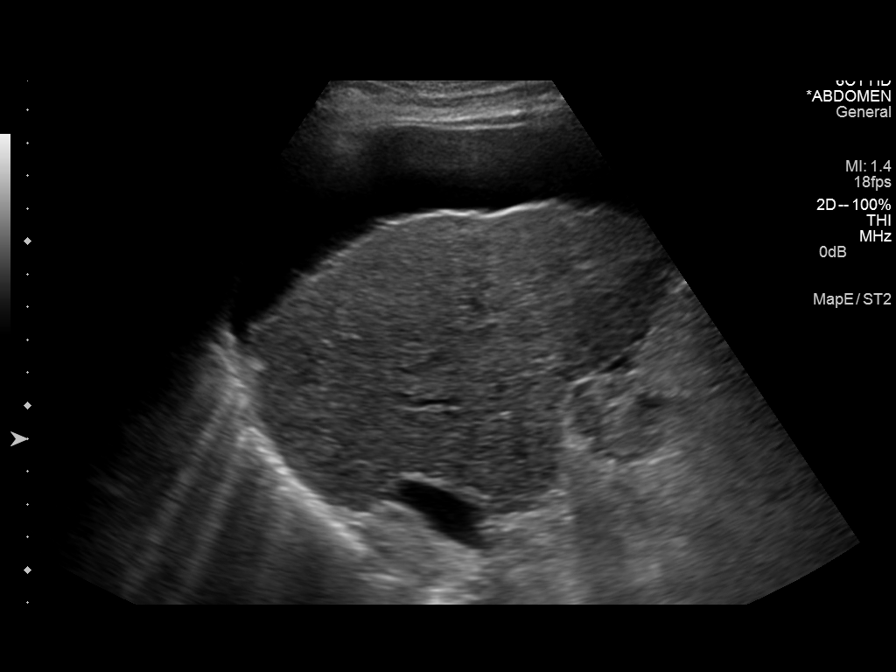
[im 2/8]
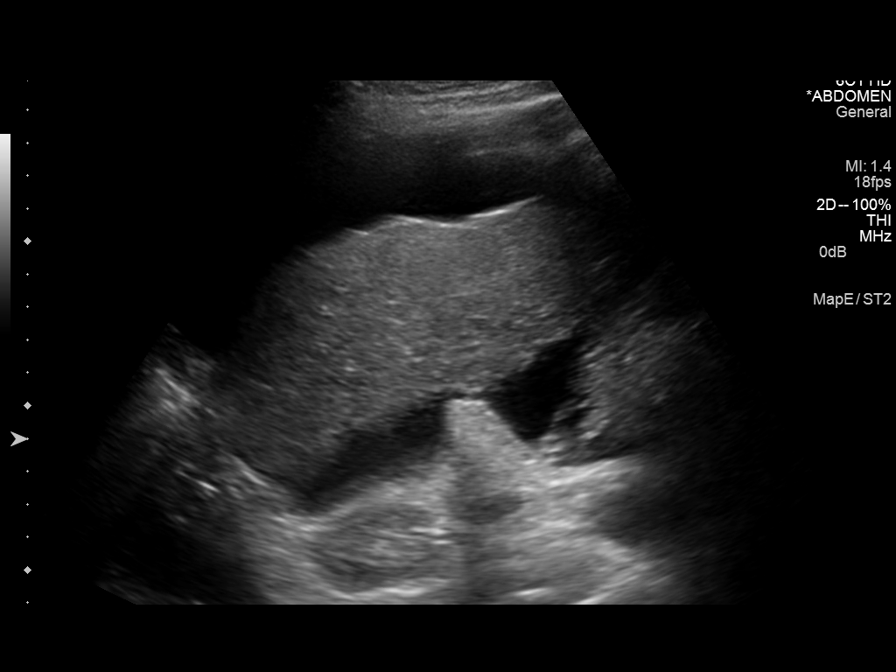
[im 3/8]
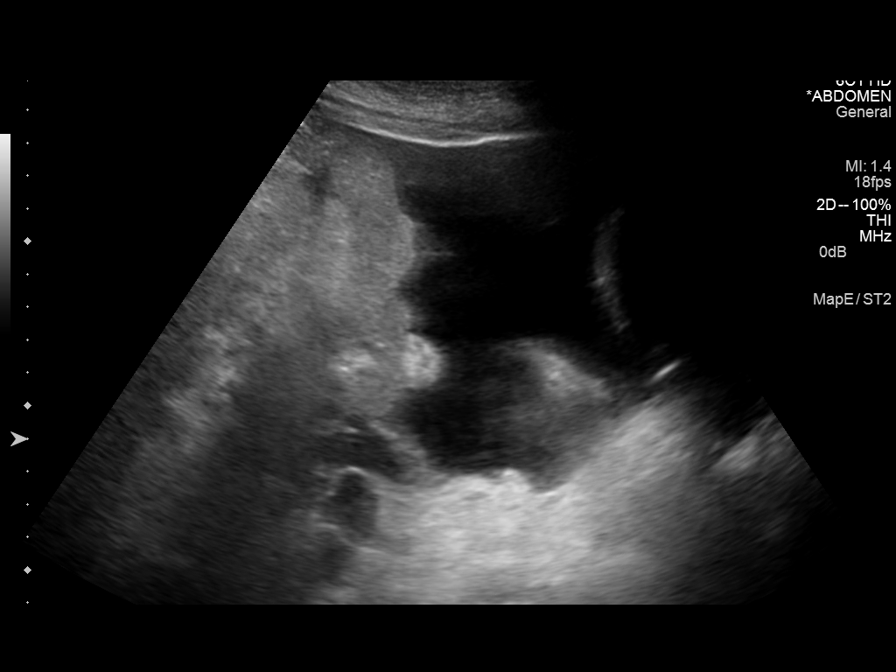
[im 4/8]
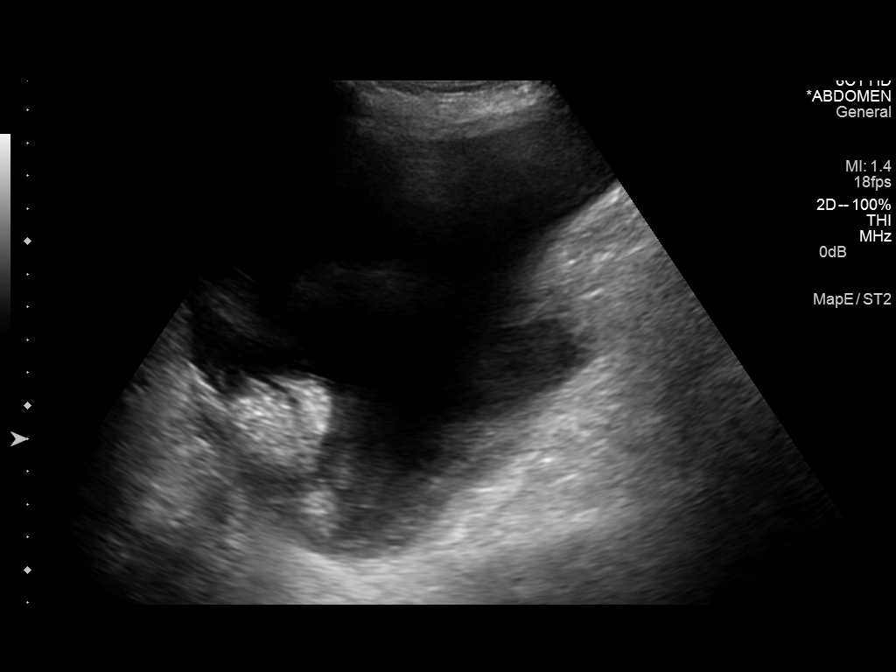
[im 5/8]
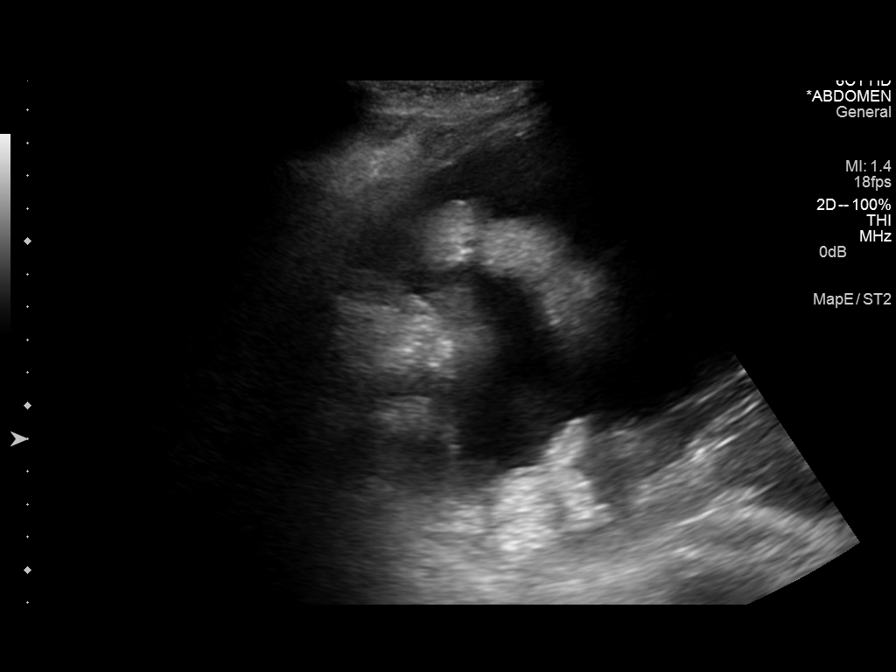
[im 6/8]
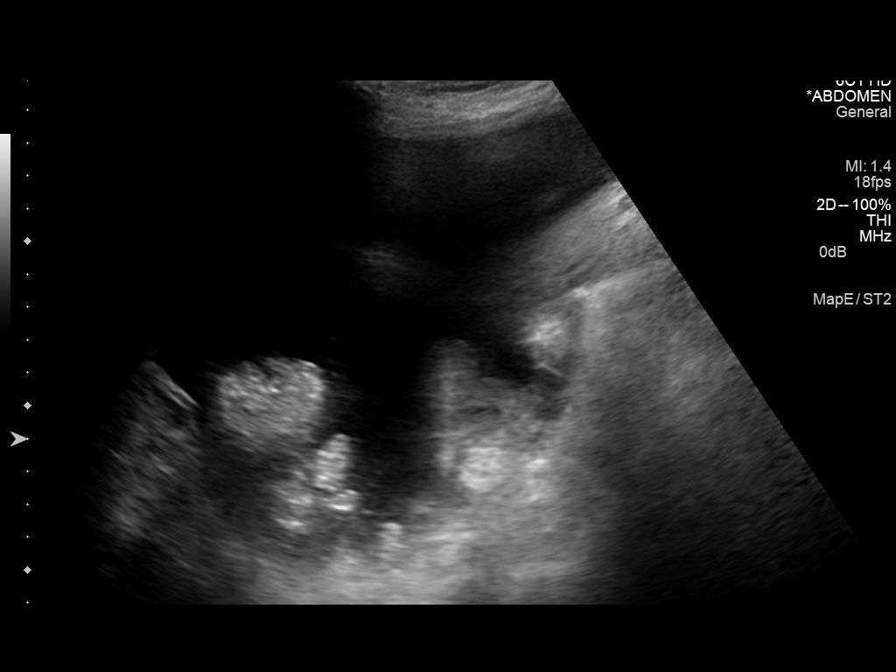
[im 7/8]
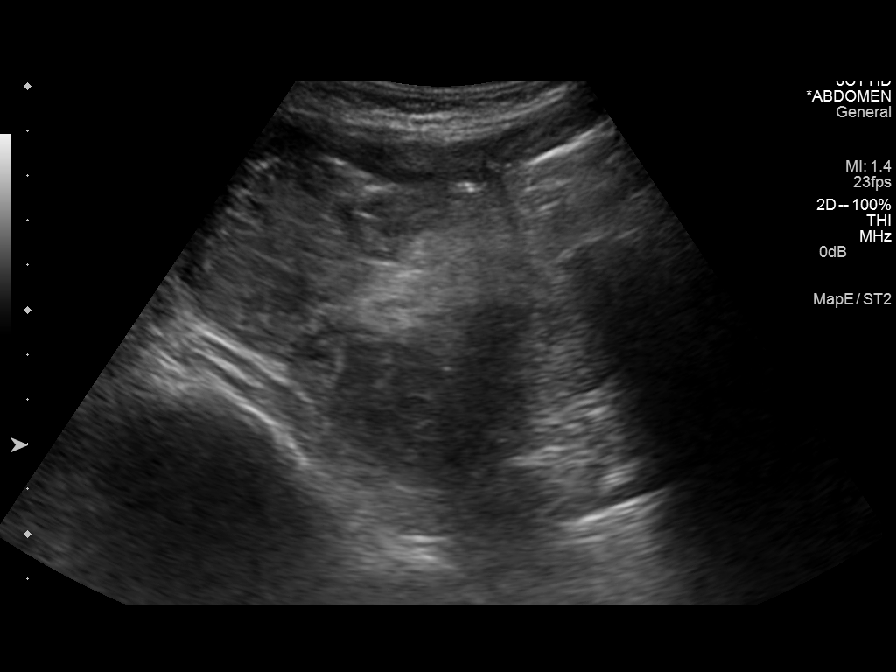
[im 8/8]
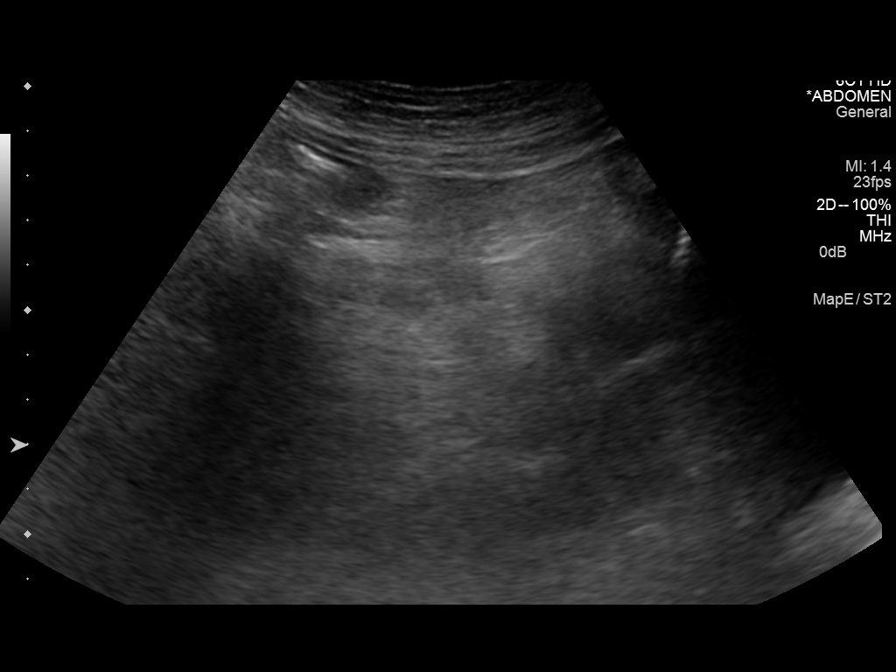

[8 of 8 positions shown; findings below may reference images not displayed]

Initial ultrasound scanning demonstrates a moderate amount of
ascites within the left lower abdominal quadrant. The left lower
abdomen was prepped and draped in the usual sterile fashion. 1%
lidocaine was used for local anesthesia. Under direct ultrasound
guidance, a 19 gauge, 10-cm, Yueh catheter was introduced. An
ultrasound image was saved for documentation purposed. The
paracentesis was performed. The catheter was removed and a dressing
was applied. The patient tolerated the procedure well without
immediate post procedural complication.
FINDINGS: A total of approximately 3.1 liters of slightly turbid,
blood-tinged/amber fluid was removed. Samples were sent to the
laboratory as requested by the clinical team.
IMPRESSION: Successful ultrasound-guided diagnostic and therapeutic paracentesis
yielding 3.1 liters of peritoneal fluid.
